# Patient Record
Sex: Female | Born: 1935 | Race: White | Hispanic: No | Smoking: Never smoker
Health system: Southern US, Community
[De-identification: ages and names within clinical notes are randomized; demographics above are authoritative.]

## PROBLEM LIST (undated history)

## (undated) DIAGNOSIS — F32A Depression, unspecified: Secondary | ICD-10-CM

## (undated) DIAGNOSIS — I2510 Atherosclerotic heart disease of native coronary artery without angina pectoris: Secondary | ICD-10-CM

## (undated) DIAGNOSIS — F329 Major depressive disorder, single episode, unspecified: Secondary | ICD-10-CM

## (undated) DIAGNOSIS — K219 Gastro-esophageal reflux disease without esophagitis: Secondary | ICD-10-CM

## (undated) DIAGNOSIS — N183 Chronic kidney disease, stage 3 unspecified: Secondary | ICD-10-CM

## (undated) DIAGNOSIS — E119 Type 2 diabetes mellitus without complications: Secondary | ICD-10-CM

## (undated) DIAGNOSIS — I9789 Other postprocedural complications and disorders of the circulatory system, not elsewhere classified: Secondary | ICD-10-CM

## (undated) DIAGNOSIS — M1990 Unspecified osteoarthritis, unspecified site: Secondary | ICD-10-CM

## (undated) DIAGNOSIS — I340 Nonrheumatic mitral (valve) insufficiency: Secondary | ICD-10-CM

## (undated) DIAGNOSIS — I4891 Unspecified atrial fibrillation: Secondary | ICD-10-CM

## (undated) DIAGNOSIS — C801 Malignant (primary) neoplasm, unspecified: Secondary | ICD-10-CM

## (undated) DIAGNOSIS — I10 Essential (primary) hypertension: Secondary | ICD-10-CM

## (undated) DIAGNOSIS — E11319 Type 2 diabetes mellitus with unspecified diabetic retinopathy without macular edema: Secondary | ICD-10-CM

## (undated) DIAGNOSIS — I739 Peripheral vascular disease, unspecified: Secondary | ICD-10-CM

## (undated) DIAGNOSIS — I219 Acute myocardial infarction, unspecified: Secondary | ICD-10-CM

## (undated) DIAGNOSIS — M47812 Spondylosis without myelopathy or radiculopathy, cervical region: Secondary | ICD-10-CM

## (undated) DIAGNOSIS — I351 Nonrheumatic aortic (valve) insufficiency: Secondary | ICD-10-CM

## (undated) DIAGNOSIS — K222 Esophageal obstruction: Secondary | ICD-10-CM

## (undated) DIAGNOSIS — Z8719 Personal history of other diseases of the digestive system: Secondary | ICD-10-CM

## (undated) DIAGNOSIS — E669 Obesity, unspecified: Secondary | ICD-10-CM

## (undated) DIAGNOSIS — R413 Other amnesia: Secondary | ICD-10-CM

## (undated) HISTORY — DX: Other amnesia: R41.3

## (undated) HISTORY — PX: ABDOMINAL HYSTERECTOMY: SHX81

## (undated) HISTORY — DX: Other postprocedural complications and disorders of the circulatory system, not elsewhere classified: I97.89

## (undated) HISTORY — DX: Obesity, unspecified: E66.9

## (undated) HISTORY — DX: Type 2 diabetes mellitus without complications: E11.9

## (undated) HISTORY — DX: Chronic kidney disease, stage 3 (moderate): N18.3

## (undated) HISTORY — PX: TOTAL KNEE ARTHROPLASTY: SHX125

## (undated) HISTORY — PX: TUBAL LIGATION: SHX77

## (undated) HISTORY — PX: CHOLECYSTECTOMY: SHX55

## (undated) HISTORY — DX: Esophageal obstruction: K22.2

## (undated) HISTORY — DX: Spondylosis without myelopathy or radiculopathy, cervical region: M47.812

## (undated) HISTORY — DX: Unspecified atrial fibrillation: I48.91

## (undated) HISTORY — PX: OTHER SURGICAL HISTORY: SHX169

## (undated) HISTORY — DX: Atherosclerotic heart disease of native coronary artery without angina pectoris: I25.10

## (undated) HISTORY — DX: Peripheral vascular disease, unspecified: I73.9

## (undated) HISTORY — PX: DILATION AND CURETTAGE OF UTERUS: SHX78

## (undated) HISTORY — DX: Acute myocardial infarction, unspecified: I21.9

## (undated) HISTORY — DX: Gastro-esophageal reflux disease without esophagitis: K21.9

## (undated) HISTORY — DX: Nonrheumatic mitral (valve) insufficiency: I34.0

## (undated) HISTORY — DX: Chronic kidney disease, stage 3 unspecified: N18.30

## (undated) HISTORY — DX: Unspecified osteoarthritis, unspecified site: M19.90

## (undated) HISTORY — DX: Nonrheumatic aortic (valve) insufficiency: I35.1

## (undated) HISTORY — DX: Essential (primary) hypertension: I10

---

## 1998-04-19 ENCOUNTER — Ambulatory Visit (HOSPITAL_COMMUNITY): Admission: RE | Admit: 1998-04-19 | Discharge: 1998-04-19 | Payer: Self-pay | Admitting: Internal Medicine

## 1998-04-19 ENCOUNTER — Encounter: Payer: Self-pay | Admitting: Internal Medicine

## 1998-09-01 ENCOUNTER — Inpatient Hospital Stay (HOSPITAL_COMMUNITY): Admission: EM | Admit: 1998-09-01 | Discharge: 1998-09-02 | Payer: Self-pay | Admitting: Emergency Medicine

## 1998-09-01 ENCOUNTER — Encounter: Payer: Self-pay | Admitting: Emergency Medicine

## 1999-10-29 ENCOUNTER — Encounter: Payer: Self-pay | Admitting: Emergency Medicine

## 1999-10-29 ENCOUNTER — Emergency Department (HOSPITAL_COMMUNITY): Admission: EM | Admit: 1999-10-29 | Discharge: 1999-10-29 | Payer: Self-pay | Admitting: Emergency Medicine

## 1999-12-09 ENCOUNTER — Other Ambulatory Visit: Admission: RE | Admit: 1999-12-09 | Discharge: 1999-12-09 | Payer: Self-pay | Admitting: Internal Medicine

## 2000-07-17 ENCOUNTER — Ambulatory Visit (HOSPITAL_COMMUNITY): Admission: RE | Admit: 2000-07-17 | Discharge: 2000-07-17 | Payer: Self-pay | Admitting: Internal Medicine

## 2000-07-17 ENCOUNTER — Encounter: Payer: Self-pay | Admitting: Internal Medicine

## 2000-08-09 ENCOUNTER — Encounter: Payer: Self-pay | Admitting: Gastroenterology

## 2000-08-09 ENCOUNTER — Ambulatory Visit (HOSPITAL_COMMUNITY): Admission: RE | Admit: 2000-08-09 | Discharge: 2000-08-09 | Payer: Self-pay | Admitting: Gastroenterology

## 2000-09-28 ENCOUNTER — Ambulatory Visit (HOSPITAL_COMMUNITY): Admission: RE | Admit: 2000-09-28 | Discharge: 2000-09-28 | Payer: Self-pay | Admitting: Gastroenterology

## 2000-09-28 ENCOUNTER — Encounter: Payer: Self-pay | Admitting: Gastroenterology

## 2000-10-10 ENCOUNTER — Encounter: Payer: Self-pay | Admitting: Gastroenterology

## 2000-10-10 ENCOUNTER — Ambulatory Visit (HOSPITAL_COMMUNITY): Admission: RE | Admit: 2000-10-10 | Discharge: 2000-10-10 | Payer: Self-pay | Admitting: Gastroenterology

## 2000-10-23 ENCOUNTER — Encounter: Admission: RE | Admit: 2000-10-23 | Discharge: 2000-10-23 | Payer: Self-pay | Admitting: Endocrinology

## 2000-10-23 ENCOUNTER — Encounter: Payer: Self-pay | Admitting: Endocrinology

## 2002-10-14 ENCOUNTER — Other Ambulatory Visit: Admission: RE | Admit: 2002-10-14 | Discharge: 2002-10-14 | Payer: Self-pay | Admitting: Obstetrics & Gynecology

## 2002-12-26 ENCOUNTER — Encounter (INDEPENDENT_AMBULATORY_CARE_PROVIDER_SITE_OTHER): Payer: Self-pay

## 2002-12-26 ENCOUNTER — Ambulatory Visit (HOSPITAL_COMMUNITY): Admission: RE | Admit: 2002-12-26 | Discharge: 2002-12-26 | Payer: Self-pay | Admitting: Obstetrics and Gynecology

## 2003-05-22 ENCOUNTER — Encounter (INDEPENDENT_AMBULATORY_CARE_PROVIDER_SITE_OTHER): Payer: Self-pay

## 2003-05-22 ENCOUNTER — Inpatient Hospital Stay (HOSPITAL_COMMUNITY): Admission: RE | Admit: 2003-05-22 | Discharge: 2003-05-24 | Payer: Self-pay | Admitting: Obstetrics and Gynecology

## 2004-04-25 ENCOUNTER — Ambulatory Visit: Payer: Self-pay | Admitting: Internal Medicine

## 2004-05-24 ENCOUNTER — Encounter: Admission: RE | Admit: 2004-05-24 | Discharge: 2004-05-24 | Payer: Self-pay | Admitting: Internal Medicine

## 2004-05-31 ENCOUNTER — Ambulatory Visit: Payer: Self-pay | Admitting: Internal Medicine

## 2004-06-07 ENCOUNTER — Ambulatory Visit: Payer: Self-pay | Admitting: Gastroenterology

## 2004-06-21 ENCOUNTER — Ambulatory Visit: Payer: Self-pay | Admitting: Gastroenterology

## 2004-07-05 ENCOUNTER — Ambulatory Visit: Payer: Self-pay | Admitting: Internal Medicine

## 2004-08-18 ENCOUNTER — Ambulatory Visit: Payer: Self-pay | Admitting: Internal Medicine

## 2005-03-02 ENCOUNTER — Ambulatory Visit: Payer: Self-pay | Admitting: Internal Medicine

## 2005-03-08 ENCOUNTER — Ambulatory Visit: Payer: Self-pay | Admitting: Cardiology

## 2005-03-08 ENCOUNTER — Ambulatory Visit: Payer: Self-pay | Admitting: Internal Medicine

## 2005-08-14 ENCOUNTER — Ambulatory Visit: Payer: Self-pay | Admitting: Internal Medicine

## 2005-08-17 ENCOUNTER — Ambulatory Visit: Payer: Self-pay | Admitting: Internal Medicine

## 2005-08-29 ENCOUNTER — Ambulatory Visit: Payer: Self-pay | Admitting: Internal Medicine

## 2006-05-07 ENCOUNTER — Ambulatory Visit: Payer: Self-pay | Admitting: Internal Medicine

## 2006-05-08 ENCOUNTER — Ambulatory Visit: Payer: Self-pay | Admitting: Gastroenterology

## 2006-05-09 ENCOUNTER — Encounter: Payer: Self-pay | Admitting: Gastroenterology

## 2006-05-09 ENCOUNTER — Ambulatory Visit (HOSPITAL_COMMUNITY): Admission: RE | Admit: 2006-05-09 | Discharge: 2006-05-09 | Payer: Self-pay | Admitting: Gastroenterology

## 2006-05-18 ENCOUNTER — Ambulatory Visit: Payer: Self-pay | Admitting: Gastroenterology

## 2006-07-03 ENCOUNTER — Ambulatory Visit: Payer: Self-pay | Admitting: Gastroenterology

## 2006-07-05 ENCOUNTER — Ambulatory Visit (HOSPITAL_COMMUNITY): Admission: RE | Admit: 2006-07-05 | Discharge: 2006-07-05 | Payer: Self-pay | Admitting: Gastroenterology

## 2006-07-11 ENCOUNTER — Ambulatory Visit: Payer: Self-pay | Admitting: Gastroenterology

## 2007-04-30 ENCOUNTER — Telehealth: Payer: Self-pay | Admitting: Internal Medicine

## 2007-05-22 ENCOUNTER — Emergency Department (HOSPITAL_COMMUNITY): Admission: EM | Admit: 2007-05-22 | Discharge: 2007-05-23 | Payer: Self-pay | Admitting: Emergency Medicine

## 2007-06-07 ENCOUNTER — Encounter: Payer: Self-pay | Admitting: *Deleted

## 2007-06-07 DIAGNOSIS — E118 Type 2 diabetes mellitus with unspecified complications: Secondary | ICD-10-CM

## 2007-06-07 DIAGNOSIS — M479 Spondylosis, unspecified: Secondary | ICD-10-CM | POA: Insufficient documentation

## 2007-06-07 DIAGNOSIS — M1990 Unspecified osteoarthritis, unspecified site: Secondary | ICD-10-CM | POA: Insufficient documentation

## 2007-06-07 DIAGNOSIS — Z9889 Other specified postprocedural states: Secondary | ICD-10-CM

## 2007-06-07 DIAGNOSIS — Z9089 Acquired absence of other organs: Secondary | ICD-10-CM

## 2007-06-07 DIAGNOSIS — Z9079 Acquired absence of other genital organ(s): Secondary | ICD-10-CM | POA: Insufficient documentation

## 2007-06-07 DIAGNOSIS — I10 Essential (primary) hypertension: Secondary | ICD-10-CM

## 2007-06-10 ENCOUNTER — Ambulatory Visit: Payer: Self-pay | Admitting: Internal Medicine

## 2007-06-10 LAB — CONVERTED CEMR LAB
ALT: 30 units/L (ref 0–35)
AST: 19 units/L (ref 0–37)
Albumin: 4.1 g/dL (ref 3.5–5.2)
Alkaline Phosphatase: 197 units/L — ABNORMAL HIGH (ref 39–117)
Basophils Relative: 0.2 % (ref 0.0–1.0)
Bilirubin, Direct: 0.1 mg/dL (ref 0.0–0.3)
Cholesterol: 174 mg/dL (ref 0–200)
Eosinophils Relative: 1.1 % (ref 0.0–5.0)
GFR calc Af Amer: 105 mL/min
GFR calc non Af Amer: 87 mL/min
Glucose, Bld: 110 mg/dL — ABNORMAL HIGH (ref 70–99)
HCT: 42.6 % (ref 36.0–46.0)
HDL: 80.3 mg/dL (ref >39.0)
Hemoglobin: 14.9 g/dL (ref 12.0–15.0)
LDL Cholesterol: 60 mg/dL (ref 0–99)
Lymphocytes Relative: 27.8 % (ref 12.0–46.0)
Monocytes Absolute: 0.7 10*3/uL (ref 0.2–0.7)
Potassium: 4.1 meq/L (ref 3.5–5.1)
Total CHOL/HDL Ratio: 2.2
Total Protein: 7.3 g/dL (ref 6.0–8.3)
Triglycerides: 170 mg/dL — ABNORMAL HIGH (ref 0–149)
VLDL: 34 mg/dL (ref 0–40)
WBC: 14.4 10*3/uL — ABNORMAL HIGH (ref 4.5–10.5)

## 2007-07-01 ENCOUNTER — Ambulatory Visit: Payer: Self-pay | Admitting: Internal Medicine

## 2007-07-01 DIAGNOSIS — C44300 Unspecified malignant neoplasm of skin of unspecified part of face: Secondary | ICD-10-CM | POA: Insufficient documentation

## 2007-07-01 DIAGNOSIS — C44309 Unspecified malignant neoplasm of skin of other parts of face: Secondary | ICD-10-CM | POA: Insufficient documentation

## 2007-07-02 ENCOUNTER — Encounter (INDEPENDENT_AMBULATORY_CARE_PROVIDER_SITE_OTHER): Payer: Self-pay | Admitting: *Deleted

## 2007-07-15 ENCOUNTER — Telehealth: Payer: Self-pay | Admitting: Internal Medicine

## 2007-09-19 ENCOUNTER — Telehealth: Payer: Self-pay | Admitting: Internal Medicine

## 2007-10-10 ENCOUNTER — Encounter: Payer: Self-pay | Admitting: Internal Medicine

## 2007-10-28 ENCOUNTER — Ambulatory Visit: Payer: Self-pay | Admitting: Internal Medicine

## 2007-11-07 ENCOUNTER — Encounter: Payer: Self-pay | Admitting: Internal Medicine

## 2007-11-13 ENCOUNTER — Encounter: Payer: Self-pay | Admitting: Internal Medicine

## 2007-11-27 ENCOUNTER — Telehealth: Payer: Self-pay | Admitting: Internal Medicine

## 2008-03-02 ENCOUNTER — Encounter: Payer: Self-pay | Admitting: Internal Medicine

## 2008-03-09 ENCOUNTER — Telehealth: Payer: Self-pay | Admitting: Internal Medicine

## 2008-05-20 ENCOUNTER — Ambulatory Visit: Payer: Self-pay | Admitting: Internal Medicine

## 2008-05-20 LAB — CONVERTED CEMR LAB
BUN: 17 mg/dL (ref 6–23)
Basophils Absolute: 0.1 10*3/uL (ref 0.0–0.1)
CO2: 31 meq/L (ref 19–32)
Calcium: 9.9 mg/dL (ref 8.4–10.5)
Cholesterol: 189 mg/dL (ref 0–200)
Eosinophils Absolute: 0.2 10*3/uL (ref 0.0–0.7)
GFR calc Af Amer: 105 mL/min
GFR calc non Af Amer: 87 mL/min
Glucose, Bld: 139 mg/dL — ABNORMAL HIGH (ref 70–99)
Lymphocytes Relative: 38.3 % (ref 12.0–46.0)
Monocytes Absolute: 0.6 10*3/uL (ref 0.1–1.0)
Neutro Abs: 3.7 10*3/uL (ref 1.4–7.7)
RDW: 12.7 % (ref 11.5–14.6)
Triglycerides: 102 mg/dL (ref 0–149)

## 2008-05-21 ENCOUNTER — Encounter: Payer: Self-pay | Admitting: Internal Medicine

## 2008-05-26 ENCOUNTER — Encounter: Payer: Self-pay | Admitting: Internal Medicine

## 2008-05-26 LAB — HM MAMMOGRAPHY: HM Mammogram: NORMAL

## 2008-05-28 ENCOUNTER — Encounter: Payer: Self-pay | Admitting: Internal Medicine

## 2008-07-16 ENCOUNTER — Encounter: Payer: Self-pay | Admitting: Internal Medicine

## 2008-07-21 ENCOUNTER — Telehealth: Payer: Self-pay | Admitting: Internal Medicine

## 2008-09-01 ENCOUNTER — Telehealth: Payer: Self-pay | Admitting: Internal Medicine

## 2008-10-28 LAB — HM DIABETES EYE EXAM: HM Diabetic Eye Exam: NORMAL

## 2008-12-14 ENCOUNTER — Telehealth: Payer: Self-pay | Admitting: Internal Medicine

## 2008-12-24 ENCOUNTER — Encounter: Admission: RE | Admit: 2008-12-24 | Discharge: 2009-01-07 | Payer: Self-pay | Admitting: Orthopedic Surgery

## 2009-03-16 ENCOUNTER — Telehealth: Payer: Self-pay | Admitting: Internal Medicine

## 2009-03-24 ENCOUNTER — Telehealth: Payer: Self-pay | Admitting: Internal Medicine

## 2009-04-23 ENCOUNTER — Telehealth: Payer: Self-pay | Admitting: Internal Medicine

## 2009-05-06 ENCOUNTER — Ambulatory Visit: Payer: Self-pay | Admitting: Internal Medicine

## 2009-05-06 LAB — CONVERTED CEMR LAB
ALT: 21 units/L (ref 0–35)
AST: 25 units/L (ref 0–37)
Albumin: 4.2 g/dL (ref 3.5–5.2)
Alkaline Phosphatase: 65 units/L (ref 39–117)
BUN: 13 mg/dL (ref 6–23)
Calcium: 9.9 mg/dL (ref 8.4–10.5)
GFR calc non Af Amer: 74.2 mL/min (ref >60)
Glucose, Bld: 129 mg/dL — ABNORMAL HIGH (ref 70–99)
Potassium: 4.6 meq/L (ref 3.5–5.1)
Sodium: 142 meq/L (ref 135–145)
Total Bilirubin: 0.5 mg/dL (ref 0.3–1.2)
VLDL: 49 mg/dL — ABNORMAL HIGH (ref 0.0–40.0)

## 2009-05-10 ENCOUNTER — Ambulatory Visit: Payer: Self-pay | Admitting: Family Medicine

## 2009-05-10 ENCOUNTER — Encounter: Payer: Self-pay | Admitting: Internal Medicine

## 2009-06-03 ENCOUNTER — Encounter: Payer: Self-pay | Admitting: Internal Medicine

## 2009-08-02 ENCOUNTER — Ambulatory Visit: Payer: Self-pay | Admitting: Internal Medicine

## 2009-08-03 ENCOUNTER — Telehealth: Payer: Self-pay | Admitting: Internal Medicine

## 2009-10-31 ENCOUNTER — Ambulatory Visit: Payer: Self-pay | Admitting: Internal Medicine

## 2009-10-31 ENCOUNTER — Inpatient Hospital Stay (HOSPITAL_COMMUNITY): Admission: EM | Admit: 2009-10-31 | Discharge: 2009-11-01 | Payer: Self-pay | Admitting: Emergency Medicine

## 2009-11-05 ENCOUNTER — Ambulatory Visit: Payer: Self-pay | Admitting: Internal Medicine

## 2009-11-05 ENCOUNTER — Telehealth: Payer: Self-pay | Admitting: Internal Medicine

## 2009-11-05 DIAGNOSIS — D509 Iron deficiency anemia, unspecified: Secondary | ICD-10-CM

## 2009-11-05 LAB — CONVERTED CEMR LAB
BUN: 14 mg/dL (ref 6–23)
Basophils Relative: 0.5 % (ref 0.0–3.0)
CO2: 31 meq/L (ref 19–32)
Calcium: 9.9 mg/dL (ref 8.4–10.5)
Chloride: 99 meq/L (ref 96–112)
Creatinine, Ser: 0.8 mg/dL (ref 0.4–1.2)
Eosinophils Absolute: 0.3 10*3/uL (ref 0.0–0.7)
Glucose, Bld: 142 mg/dL — ABNORMAL HIGH (ref 70–99)
Glucose, Urine, Semiquant: NEGATIVE
HCT: 37.4 % (ref 36.0–46.0)
Ketones, urine, test strip: NEGATIVE
Lymphocytes Relative: 21.7 % (ref 12.0–46.0)
MCHC: 33.3 g/dL (ref 30.0–36.0)
Monocytes Relative: 3.8 % (ref 3.0–12.0)
Neutro Abs: 9 10*3/uL — ABNORMAL HIGH (ref 1.4–7.7)
Nitrite: NEGATIVE
Potassium: 4.9 meq/L (ref 3.5–5.1)
Protein, U semiquant: NEGATIVE
WBC Urine, dipstick: NEGATIVE
pH: 5

## 2009-11-07 ENCOUNTER — Encounter: Payer: Self-pay | Admitting: Internal Medicine

## 2009-11-11 ENCOUNTER — Telehealth: Payer: Self-pay | Admitting: Internal Medicine

## 2009-11-12 ENCOUNTER — Ambulatory Visit: Payer: Self-pay | Admitting: Internal Medicine

## 2009-12-01 ENCOUNTER — Telehealth: Payer: Self-pay | Admitting: Internal Medicine

## 2009-12-13 ENCOUNTER — Ambulatory Visit: Payer: Self-pay | Admitting: Gastroenterology

## 2009-12-14 ENCOUNTER — Ambulatory Visit: Payer: Self-pay | Admitting: Gastroenterology

## 2009-12-16 ENCOUNTER — Encounter: Payer: Self-pay | Admitting: Gastroenterology

## 2009-12-25 ENCOUNTER — Encounter: Payer: Self-pay | Admitting: Internal Medicine

## 2010-01-06 ENCOUNTER — Encounter (INDEPENDENT_AMBULATORY_CARE_PROVIDER_SITE_OTHER): Payer: Self-pay | Admitting: *Deleted

## 2010-01-10 ENCOUNTER — Ambulatory Visit: Payer: Self-pay | Admitting: Gastroenterology

## 2010-01-10 ENCOUNTER — Encounter (INDEPENDENT_AMBULATORY_CARE_PROVIDER_SITE_OTHER): Payer: Self-pay | Admitting: *Deleted

## 2010-01-24 ENCOUNTER — Ambulatory Visit: Payer: Self-pay | Admitting: Gastroenterology

## 2010-04-26 ENCOUNTER — Ambulatory Visit: Payer: Self-pay | Admitting: Internal Medicine

## 2010-05-02 ENCOUNTER — Ambulatory Visit: Payer: Self-pay | Admitting: Internal Medicine

## 2010-05-03 ENCOUNTER — Telehealth: Payer: Self-pay | Admitting: Internal Medicine

## 2010-05-16 ENCOUNTER — Telehealth: Payer: Self-pay | Admitting: Internal Medicine

## 2010-06-06 ENCOUNTER — Ambulatory Visit: Payer: Self-pay | Admitting: Internal Medicine

## 2010-06-06 ENCOUNTER — Encounter: Payer: Self-pay | Admitting: Internal Medicine

## 2010-06-06 LAB — CONVERTED CEMR LAB
Hemoglobin: 13 g/dL (ref 12.0–15.0)
Hgb A1c MFr Bld: 8 % — ABNORMAL HIGH (ref 4.6–6.5)
Iron: 74 ug/dL (ref 42–145)
Saturation Ratios: 21.3 % (ref 20.0–50.0)

## 2010-06-07 ENCOUNTER — Telehealth: Payer: Self-pay | Admitting: Internal Medicine

## 2010-06-17 ENCOUNTER — Telehealth (INDEPENDENT_AMBULATORY_CARE_PROVIDER_SITE_OTHER): Payer: Self-pay | Admitting: *Deleted

## 2010-06-27 ENCOUNTER — Inpatient Hospital Stay (HOSPITAL_COMMUNITY)
Admission: RE | Admit: 2010-06-27 | Discharge: 2010-06-30 | Payer: Self-pay | Source: Home / Self Care | Attending: Orthopedic Surgery | Admitting: Orthopedic Surgery

## 2010-07-04 LAB — BASIC METABOLIC PANEL
BUN: 7 mg/dL (ref 6–23)
BUN: 8 mg/dL (ref 6–23)
CO2: 26 mEq/L (ref 19–32)
CO2: 27 mEq/L (ref 19–32)
Calcium: 8.9 mg/dL (ref 8.4–10.5)
Calcium: 9.3 mg/dL (ref 8.4–10.5)
Chloride: 99 mEq/L (ref 96–112)
Chloride: 103 mEq/L (ref 96–112)
Creatinine, Ser: 0.72 mg/dL (ref 0.4–1.2)
Creatinine, Ser: 0.74 mg/dL (ref 0.4–1.2)
GFR calc Af Amer: >60 mL/min (ref >60)
GFR calc Af Amer: >60 mL/min (ref >60)
GFR calc non Af Amer: >60 mL/min (ref >60)
GFR calc non Af Amer: >60 mL/min (ref >60)
Glucose, Bld: 202 mg/dL — ABNORMAL HIGH (ref 70–99)
Glucose, Bld: 197 mg/dL — ABNORMAL HIGH (ref 70–99)
Potassium: 3.8 mEq/L (ref 3.5–5.1)
Potassium: 3.8 mEq/L (ref 3.5–5.1)
Sodium: 134 mEq/L — ABNORMAL LOW (ref 135–145)
Sodium: 137 mEq/L (ref 135–145)

## 2010-07-04 LAB — CBC
HCT: 31 % — ABNORMAL LOW (ref 36.0–46.0)
HCT: 29.3 % — ABNORMAL LOW (ref 36.0–46.0)
Hemoglobin: 10.2 g/dL — ABNORMAL LOW (ref 12.0–15.0)
Hemoglobin: 9.8 g/dL — ABNORMAL LOW (ref 12.0–15.0)
MCH: 29.8 pg (ref 26.0–34.0)
MCH: 30.3 pg (ref 26.0–34.0)
MCHC: 32.9 g/dL (ref 30.0–36.0)
MCHC: 33.4 g/dL (ref 30.0–36.0)
MCV: 90.6 fL (ref 78.0–100.0)
MCV: 90.7 fL (ref 78.0–100.0)
Platelets: 220 10*3/uL (ref 150–400)
Platelets: 199 10*3/uL (ref 150–400)
RBC: 3.42 MIL/uL — ABNORMAL LOW (ref 3.87–5.11)
RBC: 3.23 MIL/uL — ABNORMAL LOW (ref 3.87–5.11)
RDW: 12.5 % (ref 11.5–15.5)
RDW: 12.5 % (ref 11.5–15.5)
WBC: 11.4 10*3/uL — ABNORMAL HIGH (ref 4.0–10.5)
WBC: 11.2 10*3/uL — ABNORMAL HIGH (ref 4.0–10.5)

## 2010-07-04 LAB — GLUCOSE, CAPILLARY
Glucose-Capillary: 143 mg/dL — ABNORMAL HIGH (ref 70–99)
Glucose-Capillary: 127 mg/dL — ABNORMAL HIGH (ref 70–99)
Glucose-Capillary: 231 mg/dL — ABNORMAL HIGH (ref 70–99)
Glucose-Capillary: 213 mg/dL — ABNORMAL HIGH (ref 70–99)
Glucose-Capillary: 183 mg/dL — ABNORMAL HIGH (ref 70–99)
Glucose-Capillary: 195 mg/dL — ABNORMAL HIGH (ref 70–99)
Glucose-Capillary: 264 mg/dL — ABNORMAL HIGH (ref 70–99)
Glucose-Capillary: 172 mg/dL — ABNORMAL HIGH (ref 70–99)
Glucose-Capillary: 181 mg/dL — ABNORMAL HIGH (ref 70–99)
Glucose-Capillary: 203 mg/dL — ABNORMAL HIGH (ref 70–99)
Glucose-Capillary: 158 mg/dL — ABNORMAL HIGH (ref 70–99)
Glucose-Capillary: 192 mg/dL — ABNORMAL HIGH (ref 70–99)
Glucose-Capillary: 178 mg/dL — ABNORMAL HIGH (ref 70–99)

## 2010-07-04 LAB — URINALYSIS, ROUTINE W REFLEX MICROSCOPIC
Bilirubin Urine: NEGATIVE
Hgb urine dipstick: NEGATIVE
Ketones, ur: 15 mg/dL — AB
Nitrite: NEGATIVE
Protein, ur: NEGATIVE mg/dL
Specific Gravity, Urine: 1.016 (ref 1.005–1.030)
Urine Glucose, Fasting: 500 mg/dL — AB
Urobilinogen, UA: 0.2 mg/dL (ref 0.0–1.0)
pH: 5.5 (ref 5.0–8.0)

## 2010-07-04 LAB — TYPE AND SCREEN
ABO/RH(D): A POS
Antibody Screen: NEGATIVE

## 2010-07-04 LAB — ABO/RH: ABO/RH(D): A POS

## 2010-07-21 NOTE — Procedures (Signed)
Summary: Upper Endoscopy  Patient: Andrea Wilkinson Note: All result statuses are Final unless otherwise noted.  Tests: (1) Upper Endoscopy (EGD)   EGD Upper Endoscopy       DONE (C)     [facility]     [address]     [place], [place]  [address]           ENDOSCOPY PROCEDURE REPORT           PATIENT:  Andrea, Wilkinson  MR#:  [id number]     BIRTHDATE:  [birth year], 78 yrs. old  GENDER:  female           ENDOSCOPIST:  Barbette Hair. Arlyce Dice, MD     Referred by:           PROCEDURE DATE:  12/14/2009     PROCEDURE:  EGD with biopsy     ASA CLASS:  Class II     INDICATIONS:  iron deficiency anemia           MEDICATIONS:   Fentanyl 50 mcg IV, Versed 4 mg IV, glycopyrrolate     (Robinal) 0.2 mg IV, 0.6cc simethancone 0.6 cc PO     TOPICAL ANESTHETIC:  Exactacain Spray           DESCRIPTION OF PROCEDURE:   After the risks benefits and     alternatives of the procedure were thoroughly explained, informed     consent was obtained.  The LB GIF-H180 [id number] endoscope was     introduced through the mouth and advanced to the third portion of     the duodenum, without limitations.  The instrument was slowly     withdrawn as the mucosa was fully examined.     <<PROCEDUREIMAGES>>           Mild gastritis was found in the total stomach. Nonerosive     gastritis throughout the stomach with areas of mild to moderate     erythema. Bxs taken (see image3 and image4).  Otherwise the     examination was normal (see image1, image2, image5, and image6).     Retroflexed views revealed no abnormalities.    The scope was then     withdrawn from the patient and the procedure completed.           COMPLICATIONS:  None           ENDOSCOPIC IMPRESSION:     1) Mild gastritis in the total stomach     2) Otherwise normal examination           Findings are probably NSAID-related but do not explain Fe     deficiency anemia     RECOMMENDATIONS:     1) My office will schedule a colonoscopy           REPEAT  EXAM:  No           ______________________________     Barbette Hair. Arlyce Dice, MD           CC:  Jacques Navy, MD           n.     REVISED:  12/30/2009 09:31 AM     eSIGNED:   Barbette Hair. Kaplan at 12/30/2009 09:31 AM           Stull, Prudy Feeler, [id number]  Note: An exclamation mark (!) indicates a result that was not dispersed into the flowsheet. Document Creation Date: 12/30/2009 9:32 AM _______________________________________________________________________  (1) Order result status: Final Collection  or observation date-time: 12/14/2009 14:35 Requested date-time:  Receipt date-time:  Reported date-time:  Referring Physician:   Ordering Physician: Melvia Heaps [phone]) Specimen Source:  Source: Launa Grill Order Number: [id number] Lab site:

## 2010-07-21 NOTE — Procedures (Signed)
Summary: Colonoscopy  Patient: Andrea Wilkinson Note: All result statuses are Final unless otherwise noted.  Tests: (1) Colonoscopy (COL)   COL Colonoscopy           DONE     [facility]     [address]     [place], [place]  [address]           COLONOSCOPY PROCEDURE REPORT           PATIENT:  Andrea Wilkinson, Andrea Wilkinson  MR#:  [id number]     BIRTHDATE:  Aug 21, 1931, 78 yrs. old  GENDER:  female           ENDOSCOPIST:  Barbette Hair. Arlyce Dice, MD     Referred by:           PROCEDURE DATE:  01/24/2010     PROCEDURE:  Diagnostic Colonoscopy     ASA CLASS:  Class II     INDICATIONS:  1) Iron deficiency anemia           MEDICATIONS:   Fentanyl 37.5 mcg IV, Versed 4 mg IV           DESCRIPTION OF PROCEDURE:   After the risks benefits and     alternatives of the procedure were thoroughly explained, informed     consent was obtained.  Digital rectal exam was performed and     revealed no abnormalities.   The LB [id number]-H180AL [id number] endoscope     was introduced through the anus and advanced to the cecum, which     was identified by both the appendix and ileocecal valve, without     limitations.  The quality of the prep was excellent, using     MoviPrep.  The instrument was then slowly withdrawn as the colon     was fully examined.     <<PROCEDUREIMAGES>>           FINDINGS:  Severe diverticulosis was found in the sigmoid colon.     Scattered diverticula were found ascending colon to sigmoid colon     (see image5, image6, image8, and image15).  This was otherwise a     normal examination of the colon (see image1, image2, image13, and     image17).   Retroflexed views in the rectum revealed no     abnormalities.    The time to cecum =  7.0  minutes. The scope was     then withdrawn (time =  6.0  min) from the patient and the     procedure completed.           COMPLICATIONS:  None           ENDOSCOPIC IMPRESSION:     1) Severe diverticulosis in the sigmoid colon     2) Diverticula, scattered  ascending colon to sigmoid colon     3) Otherwise normal examination     RECOMMENDATIONS:No further GI w/u (stool heme negative; pt taking     NSAIDS)           REPEAT EXAM:  No           ______________________________     Barbette Hair. Arlyce Dice, MD           CC: Jacques Navy, MD           n.     Rosalie DoctorBarbette Hair. Kaplan at 01/24/2010 11:30 AM           Feld, Prudy Feeler, [id number]  Note: An  exclamation mark (!) indicates a result that was not dispersed into the flowsheet. Document Creation Date: 01/24/2010 11:32 AM _______________________________________________________________________  (1) Order result status: Final Collection or observation date-time: 01/24/2010 11:26 Requested date-time:  Receipt date-time:  Reported date-time:  Referring Physician:   Ordering Physician: Melvia Heaps [phone]) Specimen Source:  Source: Launa Grill Order Number: [id number] Lab site:

## 2010-07-21 NOTE — Assessment & Plan Note (Signed)
Summary: SURGICAL CLEARANCE/NWS   Vital Signs:  Patient profile:   77 year old female Height:      62 inches Weight:      168 pounds BMI:     30.84 O2 Sat:      98 % on Room air Temp:     97.9 degrees F oral Pulse rate:   82 / minute BP sitting:   142 / 80  (left arm) Cuff size:   regular  Vitals Entered By: Bill Salinas CMA (June 06, 2010 10:45 AM)  O2 Flow:  Room air CC: office visit for surgical clearance for left knee replacement sch for Jun 27, 2010 with Dr Charlann Boxer ab   Primary Care Provider:  Illene Regulus, MD  CC:  office visit for surgical clearance for left knee replacement sch for Jan 09 and 2012 with Dr Charlann Boxer ab.  History of Present Illness: Patient presents for a pre-operative evaluation. She is a diabetic on oral medication. Her last A1C May '11 was 7.8% on metformin 500mg  two times a day and glucotrol 5 mg once a day. she has a h/o iron deficiency anemia. Her last Hgb  12.2 g May '11.   she has been doing OK except for knee pain: no shortness of breath, no chest pain. she does report that her bllod sugar has been running high at home. with fasting CBGs in the 145p-160.  Current Medications (verified): 1)  Enalapril Maleate 20 Mg Tabs (Enalapril Maleate) .... Take 1 Tablet By Mouth Once A Day 2)  Glucotrol 5 Mg  Tabs (Glipizide) .... Take One Tablet Once Daily 3)  Tramadol Hcl 50 Mg Tabs (Tramadol Hcl) .... Take 1-2 Tablet By Mouth Four Times A Day 4)  Mobic 15 Mg  Tabs (Meloxicam) .Marland Kitchen.. 1 Once Daily 5)  Lasix 20 Mg  Tabs (Furosemide) .... Take One Tablet Once Daily 6)  Caltrate 600+d Plus 600-400 Mg-Unit  Tabs (Calcium Carbonate-Vit D-Min) .... Take One Tablet Twice Daily 7)  Indomethacin 25 Mg  Caps (Indomethacin) .... Take 1 Tab Q6 As Needed 8)  Belladonna Alk-Phenobarbital 16.2 Mg  Tabs (Belladonna Alk-Phenobarbital) .Marland Kitchen.. 1 By Mouth As Needed Q12 9)  Metformin Hcl 500 Mg  Tabs (Metformin Hcl) .Marland Kitchen.. 1 By Mouth Two Times A Day 10)  Benzonatate 100 Mg  Caps  (Benzonatate) .Marland Kitchen.. 1 Three Times A Day For Cough As Needed. 11)  Omeprazole 20 Mg  Tbec (Omeprazole) .... Take 1 Tablet By Mouth Every Morning As Needed 12)  Cyclobenzaprine Hcl 5 Mg Tabs (Cyclobenzaprine Hcl) .Marland Kitchen.. 1 By Mouth Three Times A Day For Back Pain.  Allergies (verified): 1)  ! Codeine 2)  ! Aspirin  Past History:  Past Medical History: Last updated: 12/03/2009 GASTROESOPHAGEAL REFLUX DISEASE WITH STRICTURE (ICD-530.81) HYPERTENSION (ICD-401.9) Hx of ARTHRITIS, CERVICAL SPINE (ICD-721.90) DEGENERATIVE JOINT DISEASE (ICD-715.90) DIABETES MELLITUS, TYPE II (ICD-250.00) Esophageal Stricture  Past Surgical History: Last updated: 06/07/2007 HYSTERECTOMY, HX OF (ICD-V45.77) DILATION AND CURETTAGE, HX OF (ICD-V45.89) TUBAL LIGATION, HX OF (ICD-V26.51) * LEFT BREAST LUMPECTOMY * BENIGN TUMOR REMOVED FROM LEFT HIP CHOLECYSTECTOMY, HX OF (ICD-V45.79)  Family History: Last updated: January 05, 2010 father- died 72; suicide mohter- died 63- childbirth Neg-breast,colon cancer; CAD, CVA, ovarian or other gyn cancer Family History of Diabetes: father  Social History: Last updated: 01/05/2010 married 1955-1990 5 sons- '56, '57, '59, '62, '70 grandhcildren 10, 6 great-grandchildren Lives in own home, son lives with her. I-ADLs Discussed end of life care: would not want resuscitation or being maintained with mechanical ventilation.  Daily Caffeine Use 1 cup of coffe per day  Review of Systems  The patient denies anorexia, fever, decreased hearing, chest pain, dyspnea on exertion, prolonged cough, abdominal pain, severe indigestion/heartburn, incontinence, suspicious skin lesions, difficulty walking, depression, abnormal bleeding, and breast masses.    Physical Exam  General:  alert, well-developed, well-nourished, and normal appearance.   Head:  normocephalic and atraumatic.   Eyes:  vision grossly intact, pupils equal, and pupils round.   Ears:  R ear normal and L ear normal.     Neck:  supple, no thyromegaly, and no carotid bruits.   Lungs:  normal respiratory effort, normal breath sounds, no crackles, and no wheezes.   Heart:  normal rate, regular rhythm, and no murmur.   Abdomen:  soft, non-tender, and no guarding.   Neurologic:  alert & oriented X3, cranial nerves II-XII intact, strength normal in all extremities, gait normal, and DTRs symmetrical and normal.   Skin:  turgor normal, color normal, and no rashes.   Cervical Nodes:  no anterior cervical adenopathy and no posterior cervical adenopathy.   Psych:  Oriented X3, memory intact for recent and remote, normally interactive, and good eye contact.     Impression & Recommendations:  Problem # 1:  ANEMIA, IRON DEFICIENCY (ICD-280.9) Last Hgb 12.2.  Plan - lab to check Hgb and iron level. If low iron will arrange for IV iron prior to surgery.  Orders: TLB-IBC Pnl (Iron/FE;Transferrin) (83550-IBC) TLB-Hemoglobin (Hgb) (85018-HGB)  Addendum - Hgb 13, Iron level normal  Problem # 2:  HYPERTENSION (ICD-401.9)  Her updated medication list for this problem includes:    Enalapril Maleate 20 Mg Tabs (Enalapril maleate) .Marland Kitchen... Take 1 tablet by mouth once a day    Lasix 20 Mg Tabs (Furosemide) .Marland Kitchen... Take one tablet once daily  BP today: 142/80 Prior BP: 142/68 (12/13/2009)  Labs Reviewed: K+: 4.9 (11/05/2009) Creat: : 0.8 (11/05/2009)    Adequate control - OK for surgery. Will need monitoring while in-patient  Problem # 3:  DIABETES MELLITUS, TYPE II (ICD-250.00)  Her updated medication list for this problem includes:    Enalapril Maleate 20 Mg Tabs (Enalapril maleate) .Marland Kitchen... Take 1 tablet by mouth once a day    Glucotrol 5 Mg Tabs (Glipizide) .Marland Kitchen... Take one tablet once daily    Metformin Hcl 500 Mg Tabs (Metformin hcl) .Marland Kitchen... 1 by mouth two times a day  Orders: TLB-A1C / Hgb A1C (Glycohemoglobin) (83036-A1C)  Labs Reviewed: Creat: 0.8 (11/05/2009)     Last Eye Exam: normal (10/28/2008) Reviewed  HgBA1c results: 7.8 (11/05/2009)  6.9 (05/06/2009)  Plan - A1C today with adjustment in chronic meds as indicated.            Will need peri-operative sliding scale coverage.  addendum A1C 8% Plan - increase metformin to 1g two times a day.  Problem # 4:  PREOPERATIVE EXAMINATION (ICD-V72.84)  medically stable for surgery. Will attend to h/o iron deficiency anemia as noted above. Will be happy to follow this patient while in hospital to assist in managment of her medical conditions.   Orders: EKG w/ Interpretation (93000)  Complete Medication List: 1)  Enalapril Maleate 20 Mg Tabs (Enalapril maleate) .... Take 1 tablet by mouth once a day 2)  Glucotrol 5 Mg Tabs (Glipizide) .... Take one tablet once daily 3)  Tramadol Hcl 50 Mg Tabs (Tramadol hcl) .... Take 1-2 tablet by mouth four times a day 4)  Mobic 15 Mg Tabs (Meloxicam) .Marland Kitchen.. 1 once daily 5)  Lasix 20 Mg Tabs (Furosemide) .... Take one tablet once daily 6)  Caltrate 600+d Plus 600-400 Mg-unit Tabs (Calcium carbonate-vit d-min) .... Take one tablet twice daily 7)  Indomethacin 25 Mg Caps (Indomethacin) .... Take 1 tab q6 as needed 8)  Belladonna Alk-phenobarbital 16.2 Mg Tabs (Belladonna alk-phenobarbital) .Marland Kitchen.. 1 by mouth as needed q12 9)  Metformin Hcl 500 Mg Tabs (Metformin hcl) .Marland Kitchen.. 1 by mouth two times a day 10)  Benzonatate 100 Mg Caps (Benzonatate) .Marland Kitchen.. 1 three times a day for cough as needed. 11)  Omeprazole 20 Mg Tbec (Omeprazole) .... Take 1 tablet by mouth every morning as needed 12)  Cyclobenzaprine Hcl 5 Mg Tabs (Cyclobenzaprine hcl) .Marland Kitchen.. 1 by mouth three times a day for back pain.   Orders Added: 1)  TLB-IBC Pnl (Iron/FE;Transferrin) [83550-IBC] 2)  TLB-Hemoglobin (Hgb) [85018-HGB] 3)  TLB-A1C / Hgb A1C (Glycohemoglobin) [83036-A1C] 4)  Consultation Level III [40981] 5)  EKG w/ Interpretation [93000]

## 2010-07-21 NOTE — Progress Notes (Signed)
Summary: NEEDS REFERRAL ASAP  Phone Note Call from Patient Call back at Home Phone [phone]   Summary of Call: Patient is requesting referral to Dr Charlann Boxer, unsure of reason.  Initial call taken by: Lamar Sprinkles, CMA,  December 01, 2009 3:28 PM  Follow-up for Phone Call        Pt has seen Dr Charlann Boxer in the past for knees and neck. C/o back pain x 2 mths and wants to see Dr Charlann Boxer. Pt has apt for 1:15 tomorrow but needs referral from PCP Follow-up by: Lamar Sprinkles, CMA,  December 01, 2009 5:44 PM  Additional Follow-up for Phone Call Additional follow up Details #1::        k. referral requested Additional Follow-up by: Jacques Navy MD,  December 02, 2009 5:49 AM

## 2010-07-21 NOTE — Progress Notes (Signed)
Summary: PAPERWORK  Phone Note Call from Patient Call back at Home Phone [phone]   Summary of Call: Patient is requesting to know if MD recieved and completed papers she mailed to MD. Pt originally dropped off papers and said they were mailed back w/o being completed so I had advised her to mail them back b/c transportation was an issue.  Initial call taken by: Lamar Sprinkles, CMA,  May 16, 2010 10:08 AM  Follow-up for Phone Call        opened in this mornings mail. Wil lwork on it.  Follow-up by: Jacques Navy MD,  May 16, 2010 10:30 AM  Additional Follow-up for Phone Call Additional follow up Details #1::        completed paperwork and turned over to Ami for some additional information.  Additional Follow-up by: Jacques Navy MD,  May 20, 2010 5:10 PM    Additional Follow-up for Phone Call Additional follow up Details #2::    latoya spoke with pt and she is unaware of paperwork. She states she has already gotten the paper work that she needed. Follow-up by: Ami Bullins CMA,  May 30, 2010 3:07 PM

## 2010-07-21 NOTE — Assessment & Plan Note (Signed)
Summary: iron def. anemia...em   History of Present Illness Visit Type: Initial Consult Primary GI MD: Melvia Heaps MD [facility] Primary Provider: Illene Regulus, MD Requesting Provider: Illene Regulus, MD Chief Complaint: Patient complains of being tired alot but no other complaints she is referred for iron def anemia.  History of Present Illness:   Andrea Wilkinson is a 77 year old white female referred at the request of Dr. Debby Bud for evaluation of an iron deficiency anemia.  She has a history of  an esophageal stricture for which she was dilated in 2007.  Colonoscopy in 2006 demonstrated diverticulosis only.  She takes iron supplementation.  She has been on NSAIDs for several years, most recently Mobic.  He denies abdominal pain, melena or hematochezia or dysphagia..  Hemoglobin in May, 2011 was 12.5.  It was 13.5 in December, 2009.     GI Review of Systems      Denies abdominal pain, acid reflux, belching, bloating, chest pain, dysphagia with liquids, dysphagia with solids, heartburn, loss of appetite, nausea, vomiting, vomiting blood, weight loss, and  weight gain.        Denies anal fissure, black tarry stools, change in bowel habit, constipation, diarrhea, diverticulosis, fecal incontinence, heme positive stool, hemorrhoids, irritable bowel syndrome, jaundice, light color stool, liver problems, rectal bleeding, and  rectal pain.    Current Medications (verified): 1)  Enalapril Maleate 20 Mg Tabs (Enalapril Maleate) .... Take 1 Tablet By Mouth Once A Day 2)  Glucotrol 5 Mg  Tabs (Glipizide) .... Take One Tablet Once Daily 3)  Tramadol Hcl 50 Mg Tabs (Tramadol Hcl) .... Take 1-2 Tablet By Mouth Four Times A Day 4)  Mobic 15 Mg  Tabs (Meloxicam) .Marland Kitchen.. 1 Once Daily 5)  Lasix 20 Mg  Tabs (Furosemide) .... Take One Tablet Once Daily 6)  Caltrate 600+d Plus 600-400 Mg-Unit  Tabs (Calcium Carbonate-Vit D-Min) .... Take One Tablet Twice Daily 7)  Indomethacin 25 Mg  Caps (Indomethacin) .... Take 1  Tab Q6 As Needed 8)  Belladonna Alk-Phenobarbital 16.2 Mg  Tabs (Belladonna Alk-Phenobarbital) .Marland Kitchen.. 1 By Mouth As Needed Q12 9)  Metformin Hcl 500 Mg  Tabs (Metformin Hcl) .Marland Kitchen.. 1 By Mouth Two Times A Day 10)  Benzonatate 100 Mg  Caps (Benzonatate) .Marland Kitchen.. 1 Three Times A Day For Cough As Needed. 11)  Omeprazole 20 Mg  Tbec (Omeprazole) .... Take 1 Tablet By Mouth Every Morning As Needed 12)  Diclofenac Sodium 75 Mg  Tbec (Diclofenac Sodium) .... Take 1 Tablet By Mouth Once A Day 13)  Cyclobenzaprine Hcl 5 Mg Tabs (Cyclobenzaprine Hcl) .Marland Kitchen.. 1 By Mouth Three Times A Day For Back Pain.  Allergies (verified): 1)  ! Codeine 2)  ! Aspirin  Past History:  Past Medical History: Reviewed history from 12/03/2009 and no changes required. GASTROESOPHAGEAL REFLUX DISEASE WITH STRICTURE (ICD-530.81) HYPERTENSION (ICD-401.9) Hx of ARTHRITIS, CERVICAL SPINE (ICD-721.90) DEGENERATIVE JOINT DISEASE (ICD-715.90) DIABETES MELLITUS, TYPE II (ICD-250.00) Esophageal Stricture  Past Surgical History: Reviewed history from 06/07/2007 and no changes required. HYSTERECTOMY, HX OF (ICD-V45.77) DILATION AND CURETTAGE, HX OF (ICD-V45.89) TUBAL LIGATION, HX OF (ICD-V26.51) * LEFT BREAST LUMPECTOMY * BENIGN TUMOR REMOVED FROM LEFT HIP CHOLECYSTECTOMY, HX OF (ICD-V45.79)  Family History: father- died 65; suicide mohter- died 44- childbirth Neg-breast,colon cancer; CAD, CVA, ovarian or other gyn cancer Family History of Diabetes: father  Social History: married 1955-1990 5 sons- '56, '57, '59, '62, '70 grandhcildren 10, 6 great-grandchildren Lives in own home, son lives with her. I-ADLs Discussed end of life  care: would not want resuscitation or being maintained with mechanical ventilation. Daily Caffeine Use 1 cup of coffe per day  Review of Systems       The patient complains of arthritis/joint pain, back pain, confusion, fatigue, hearing problems, muscle pains/cramps, night sweats, sleeping problems,  swelling of feet/legs, and urine leakage.         All other systems were reviewed and were negative   Vital Signs:  Patient profile:   77 year old female Height:      62 inches Weight:      158 pounds BMI:     29.00 Pulse rate:   88 / minute Pulse rhythm:   regular BP sitting:   142 / 68  (left arm) Cuff size:   regular  Vitals Entered By: Harlow Mares CMA Duncan Dull) (December 13, 2009 8:33 AM)  Physical Exam  Additional Exam:  She is a well-developed well-nourished female  skin: anicteric HEENT: normocephalic; PEERLA; no nasal or pharyngeal abnormalities neck: supple nodes: no cervical lymphadenopathy chest: clear to ausculatation and percussion heart: no murmurs, gallops, or rubs abd: soft, nontender; BS normoactive; no abdominal masses, tenderness, organomegaly rectal: deferred ext: no cynanosis, clubbing, edema skeletal: no deformities neuro: oriented x 3; no focal abnormalities    Impression & Recommendations:  Problem # 1:  ANEMIA, IRON DEFICIENCY (ICD-280.9)  Her anemia could be related to NSAID use which can cause erosions or ulcerations anywhere along GI tract.  Recommendations #1 upper endoscopy #2 stool Hemoccults #3 colonoscopy if upper endoscopy is not diagnostic for a GI bleeding source  Risks, alternatives, and complications of the procedure, including bleeding, perforation, and possible need for surgery, were explained to the patient.  Patient's questions were answered.  Orders: EGD (EGD)  Problem # 2:  GASTROESOPHAGEAL REFLUX DISEASE WITH STRICTURE (ICD-530.81)  Currently asymptomatic  Orders: EGD (EGD)  Problem # 3:  DIABETES MELLITUS, TYPE II (ICD-250.00) Assessment: Comment Only  Patient Instructions: 1)  Copy sent to : Micheal Norins,MD 2)  Your EGD is scheduled for 12/14/2009 at 2:30pm 3)   Please arrive on the 4th florr at 1:30pm 4)  You have been instructed on your diabetic meds 5)  You will go to the basement for hemoccult test  today 6)  The medication list was reviewed and reconciled.  All changed / newly prescribed medications were explained.  A complete medication list was provided to the patient / caregiver.

## 2010-07-21 NOTE — Letter (Signed)
Summary: Results Letter  [facility]  [address] [place], [place] [address]   Phone: [phone]  Fax: [phone]        December 16, 2009 MRN: [id number]    [facility] [address] [place], [place]  [address]    Dear Ms. Papp,   Your biopsies demonstrated inflammatory changes only.    Please follow the recommendations previously discussed.  Should you have any immediate concerns or questions, feel free to contact me at the office.    Sincerely,  Barbette Hair. Arlyce Dice, M.D., [facility]          Sincerely,  Louis Meckel MD  This letter has been electronically signed by your physician.  Appended Document: Results Letter letter mailed 7.6.11

## 2010-07-21 NOTE — Letter (Signed)
Summary: Diabetic Instructions   Gastroenterology  [address] [place], [place] [address]   Phone: [phone]  Fax: [phone]    Andrea Wilkinson 25-Apr-1932 MRN: [id number]   _  _   ORAL DIABETIC MEDICATION INSTRUCTIONS  The day before your procedure:   Take your diabetic pill as you do normally  The day of your procedure:   Do not take your diabetic pill    We will check your blood sugar levels during the admission process and again in Recovery before discharging you home  ________________________________________________________________________

## 2010-07-21 NOTE — Procedures (Signed)
Summary: EGD   EGD  Procedure date:  08/09/2000  Findings:      Findings: Stricture:  Location: [facility]    EGD  Procedure date:  08/09/2000  Findings:      Findings: Stricture:  Location: [facility]   Patient Name: Andrea Wilkinson, Andrea Wilkinson MRN: [id number] Procedure Procedures: Panendoscopy (EGD) CPT: 43235.    with esophageal dilation. CPT: G9296129.  Personnel: Endoscopist: Barbette Hair. Arlyce Dice, MD.  Exam Location: Exam performed in Endoscopy Suite.  Patient Consent: Procedure, Alternatives, Risks and Benefits discussed, consent obtained,  Indications Symptoms: Dysphagia.  History  Pre-Exam Physical: Performed Aug 09, 2000  Cardio-pulmonary exam, HEENT exam, Abdominal exam, Extremity exam, Neurological exam, Mental status exam WNL.  Exam Exam Info: Maximum depth of insertion Duodenum, intended Duodenum. ASA Classification: II. Tolerance: good.  Sedation Meds: Fentanyl 50 mcg. Versed 4 mg. Robinul 0.2 Cetacaine Spray 1 sprays  Monitoring: BP and pulse monitoring done. Oximetry used. Supplemental O2 given  Fluoroscopy: Fluoroscopy was not used.  Findings STRICTURE / STENOSIS: Stricture in Distal Esophagus.  Constriction: partial. 40 cm from mouth. ICD9: Esophageal Stricture: 530.3.  - Dilation: Distal Esophagus. Procedure was performed under Fluoroscopy. Savary dilator used, Diameter: 15-16-17 mm, Minimal Resistance, No Heme present on extraction.   Assessment Abnormal examination, see findings above.  Diagnoses: 530.3: Esophageal Stricture.   Events  Unplanned Intervention: No unplanned interventions were required.  Unplanned Events: There were no complications. Plans Medication(s): Continue current medications.  Disposition: After procedure patient sent to recovery.  Scheduling: Office Visit, to [employer]. Arlyce Dice, MD, around Sep 13, 2000.    This report was created from the original endoscopy report, which was reviewed and signed by  the above listed endoscopist.

## 2010-07-21 NOTE — Procedures (Signed)
Summary: EGD   EGD  Procedure date:  05/09/2006  Findings:      Findings: Stricture:  Location: [facility]   Patient Name: Andrea Wilkinson, Andrea Wilkinson MRN:  Procedure Procedures: Panendoscopy (EGD) CPT: 43235.    with esophageal dilation. CPT: G9296129.  Personnel: Endoscopist: Barbette Hair. Arlyce Dice, MD.  Indications Symptoms: Dysphagia.  History  Current Medications: Patient is not currently taking Coumadin.  Pre-Exam Physical: Performed May 09, 2006  Entire physical exam was normal.  Exam Exam Info: Maximum depth of insertion Duodenum, intended Duodenum. Vocal cords visualized. Gastric retroflexion performed. ASA Classification: II. Tolerance: good.  Sedation Meds: Robinul 0.2 given IV. Fentanyl 25 mcg. given IV. Versed 4 mg. given IV. Cetacaine Spray 2 sprays given aerosolized.  Monitoring: BP and pulse monitoring done. Oximetry used. Supplemental O2 given at 2 Liters.  Findings - Normal: Proximal Esophagus to Distal Esophagus.  - Normal: Fundus to Duodenal 2nd Portion.  STRICTURE / STENOSIS: Stricture in Distal Esophagus.  Constriction: partial. 40 cm from mouth. ICD9: Esophageal Stricture: 530.3.  - Dilation: Distal Esophagus. Balloon/Microvasive dilator used, Diameter: 15-16.5-18 mm, Moderate Resistance, No Heme present on extraction. Outcome: successful.   Assessment Abnormal examination, see findings above.  Diagnoses: 530.3: Esophageal Stricture.   Events  Unplanned Intervention: No unplanned interventions were required.  Unplanned Events: There were no complications. Plans Medication(s): Continue current medications.  Patient Education: Patient given standard instructions for: Stenosis / Stricture.  Scheduling: Follow-up prn.   This report was created from the original endoscopy report, which was reviewed and signed by the above listed endoscopist.    cc. Micheal Norins,MD

## 2010-07-21 NOTE — Miscellaneous (Signed)
Summary: [facility]  Clinical Lists Changes  Medications: Added new medication of MOVIPREP 100 GM  SOLR (PEG-KCL-NACL-NASULF-NA ASC-C) As per prep instructions. - Signed Rx of MOVIPREP 100 GM  SOLR (PEG-KCL-NACL-NASULF-NA ASC-C) As per prep instructions.;  #1 x 0;  Signed;  Entered by: Ezra Sites RN;  Authorized by: Louis Meckel MD;  Method used: Electronically to [facility] [address] [place], [place], [place]  [address], Ph: [phone], Fax: [phone] Allergies: Changed allergy or adverse reaction from CODEINE to CODEINE Changed allergy or adverse reaction from ASPIRIN to ASPIRIN    Prescriptions: MOVIPREP 100 GM  SOLR (PEG-KCL-NACL-NASULF-NA ASC-C) As per prep instructions.  #1 x 0   Entered by:   Ezra Sites RN   Authorized by:   Louis Meckel MD   Signed by:   Ezra Sites RN on 01/10/2010   Method used:   Electronically to        [facility] (retail)       [address]       [place], [place]  [address]       Ph: [phone]       Fax: [phone]   RxID:   [id number]

## 2010-07-21 NOTE — Progress Notes (Signed)
  Phone Note Outgoing Call   Reason for Call: Discuss lab or test results Summary of Call: Please call patient - hgb is 13 - normal and iron level is normal. A1C 8% - too high. Increase metformin to 1000mg  two times a day ( 2x500 until gone then new Rx). Please update med list and send in new Rx.  Initial call taken by: Jacques Navy MD,  June 07, 2010 5:43 AM  Follow-up for Phone Call        pt informed Follow-up by: Ami Bullins CMA,  June 07, 2010 4:39 PM    New/Updated Medications: METFORMIN HCL 1000 MG TABS (METFORMIN HCL) 1 tablet two times a day Prescriptions: METFORMIN HCL 1000 MG TABS (METFORMIN HCL) 1 tablet two times a day  #120 x 1   Entered by:   Ami Bullins CMA   Authorized by:   Jacques Navy MD   Signed by:   Bill Salinas CMA on 06/07/2010   Method used:   Electronically to        [facility]* (retail)       [address] [place], [place]  [address]       Ph: [phone]       Fax: [phone]   RxID:   [id number]

## 2010-07-21 NOTE — Procedures (Signed)
Summary: colonoscopy   Colonoscopy  Procedure date:  06/21/2004  Findings:      Results: Diverticulosis.       Location:  [facility].    Comments:      Repeat colonoscopy in 7 years Patient Name: Andrea Wilkinson, Andrea Wilkinson MRN: [id number] Procedure Procedures: Colorectal cancer screening, average risk CPT: G0121.  Personnel: Endoscopist: Barbette Hair. Arlyce Dice, MD.  Patient Consent: Procedure, Alternatives, Risks and Benefits discussed, consent obtained, from patient.  Indications  Average Risk Screening Routine.  History  Current Medications: Patient is not currently taking Coumadin.  Pre-Exam Physical: Performed Jun 21, 2004. Entire physical exam was normal.  Exam Exam: Extent of exam reached: Cecum, extent intended: Cecum.  The cecum was identified by IC valve. Colon retroflexion performed. ASA Classification: II. Tolerance: good.  Monitoring: Pulse and BP monitoring, Oximetry used. Supplemental O2 given. at 2 Liters.  Colon Prep Used Miralax for colon prep. Prep results: good.  Sedation Meds: Patient assessed and found to be appropriate for moderate (conscious) sedation. Sedation was managed by the Endoscopist. Fentanyl 25 mcg. given IV. Versed 5 mg. given IV.  Findings - DIVERTICULOSIS: Ascending Colon to Sigmoid Colon. ICD9: Diverticulosis: 562.10. Comments: Diffuse diverticular changes.  NORMAL EXAM: Cecum.  NORMAL EXAM: Rectum.   Assessment Abnormal examination, see findings above.  Diagnoses: 562.10: Diverticulosis.   Events  Unplanned Interventions: No intervention was required.  Unplanned Events: There were no complications. Plans  Post Exam Instructions: Post sedation instructions given.  Patient Education: Patient given standard instructions for: Diverticulosis.  Scheduling/Referral: Colonoscopy, to Barbette Hair. Arlyce Dice, MD, around Jun 22, 2011.    This report was created from the original endoscopy report, which was reviewed and signed by  the above listed endoscopist.    cc. Micheal Norins,MD

## 2010-07-21 NOTE — Medication Information (Signed)
[facility]   Imported By: L[place] 12/29/2009 09:16:01  _____________________________________________________________________  External Attachment:    Type:   Image     Comment:   External Document

## 2010-07-21 NOTE — Progress Notes (Signed)
  Phone Note Other Incoming   Request: Send information Summary of Call: Faxed Surgical Clearance to [phone] Attn: Agustin Cree

## 2010-07-21 NOTE — Progress Notes (Signed)
  Phone Note Refill Request Message from:  Fax from Pharmacy on Nov 11, 2009 4:03 PM  Refills Requested: Medication #1:  INDOMETHACIN 25 MG  CAPS Take 1 tab q6 as needed  Medication #2:  CYCLOBENZAPRINE HCL 5 MG TABS 1 by mouth three times a day for back pain.. Initial call taken by: Ami Bullins CMA,  Nov 11, 2009 4:03 PM    Prescriptions: CYCLOBENZAPRINE HCL 5 MG TABS (CYCLOBENZAPRINE HCL) 1 by mouth three times a day for back pain.  #30 x 2   Entered by:   Bill Salinas CMA   Authorized by:   Jacques Navy MD   Signed by:   Bill Salinas CMA on 11/11/2009   Method used:   Electronically to        [facility]* (retail)       [address] [place], [place]  [address]       Ph: [phone]       Fax: [phone]   RxID:   [id number] INDOMETHACIN 25 MG  CAPS (INDOMETHACIN) Take 1 tab q6 as needed  #30 Each x 2   Entered by:   Bill Salinas CMA   Authorized by:   Jacques Navy MD   Signed by:   Bill Salinas CMA on 11/11/2009   Method used:   Electronically to        [facility] (retail)       [address] [place], [place]  [address]       Ph: [phone]       Fax: [phone]   RxID:   [id number]

## 2010-07-21 NOTE — Progress Notes (Signed)
  Phone Note Outgoing Call   Reason for Call: Discuss lab or test results Summary of Call: call patient - stop glucotrol. Needs OV Thursday or Friday to review labs, monitor condition and discuss change in diabetes management.  Thanks Initial call taken by: Jacques Navy MD,  Nov 05, 2009 5:42 PM  Follow-up for Phone Call        pt sch for friday 11/12/2009 Follow-up by: Ami Bullins CMA,  Nov 08, 2009 3:10 PM

## 2010-07-21 NOTE — Assessment & Plan Note (Signed)
Summary: fu--d/t--back problem--meds-stc   Vital Signs:  Patient profile:   77 year old female Height:      62 inches Weight:      170 pounds BMI:     31.21 O2 Sat:      96 % on Room air Temp:     98.3 degrees F oral Pulse rate:   81 / minute BP sitting:   138 / 72  (left arm) Cuff size:   regular  Vitals Entered By: Bill Salinas CMA (August 02, 2009 10:33 AM)  O2 Flow:  Room air CC: Pt here for med refill her insurance has changed and she now uses [facility]. Pt also complains of low back pain x 6weeks/ she has never had Zostavax and states she has never had a pap/ ab   Primary Care Provider:  Micaila Ziemba  CC:  Pt here for med refill her insurance has changed and she now uses [facility]. Pt also complains of low back pain x 6weeks/ she has never had Zostavax and states she has never had a pap/ ab.  History of Present Illness: Patient presents for medication refills. She also is c/o low back pain. She ha shad two weeks of pain. She rates the pain as a 6-7/10. No radiation to legs. She does report that when she walks she will feel like her legs weight a ton. She has a 5-10 min recovery time with sitting. She has continued taking tramadol and mobic with good results but now is having pain.   She has been well controlled in regard to DM with last A1C 6.9% in Nov '11 (chart reviewed). Likewise, lipids have been well controlled with LDL of 89.   Current Medications (verified): 1)  Enalapril Maleate 20 Mg Tabs (Enalapril Maleate) .... Take 1 Tablet By Mouth Once A Day 2)  Glucotrol 5 Mg  Tabs (Glipizide) .... Take One Tablet Once Daily 3)  Tramadol Hcl 50 Mg Tabs (Tramadol Hcl) .... Take 1-2 Tablet By Mouth Four Times A Day 4)  Mobic 15 Mg  Tabs (Meloxicam) .Marland Kitchen.. 1 Once Daily 5)  Lasix 20 Mg  Tabs (Furosemide) .... Take One Tablet Once Daily 6)  Caltrate 600+d Plus 600-400 Mg-Unit  Tabs (Calcium Carbonate-Vit D-Min) .... Take One Tablet Twice Daily 7)  Indomethacin 25 Mg   Caps (Indomethacin) .... Take 1 Tab Q6 As Needed 8)  Belladonna Alk-Phenobarbital 16.2 Mg  Tabs (Belladonna Alk-Phenobarbital) .Marland Kitchen.. 1 By Mouth As Needed Q12 9)  Metformin Hcl 500 Mg  Tabs (Metformin Hcl) .Marland Kitchen.. 1 By Mouth Two Times A Day 10)  Benzonatate 100 Mg  Caps (Benzonatate) .Marland Kitchen.. 1 Three Times A Day For Cough As Needed. 11)  Omeprazole 20 Mg  Tbec (Omeprazole) .... Take 1 Tablet By Mouth Every Morning As Needed 12)  Diclofenac Sodium 75 Mg  Tbec (Diclofenac Sodium) .... Take 1 Tablet By Mouth Once A Day  Allergies (verified): 1)  ! Codeine 2)  ! Aspirin  Past History:  Past Medical History: Last updated: 06/07/2007 GASTROESOPHAGEAL REFLUX DISEASE WITH STRICTURE (ICD-530.81) HYPERTENSION (ICD-401.9) Hx of ARTHRITIS, CERVICAL SPINE (ICD-721.90) DEGENERATIVE JOINT DISEASE (ICD-715.90) DIABETES MELLITUS, TYPE II (ICD-250.00)    Past Surgical History: Last updated: 06/07/2007 HYSTERECTOMY, HX OF (ICD-V45.77) DILATION AND CURETTAGE, HX OF (ICD-V45.89) TUBAL LIGATION, HX OF (ICD-V26.51) * LEFT BREAST LUMPECTOMY * BENIGN TUMOR REMOVED FROM LEFT HIP CHOLECYSTECTOMY, HX OF (ICD-V45.79)  Family History: Last updated: 06-17-2007 father- died 48; suicide mohter- died 47- childbirth Neg-breast,colon cancer;  CAD, CVA, ovarian or other gyn cancer  Social History: Last updated: 05/06/2009 married 1955-1990 5 sons- '56, '57, '59, '62, '70 grandhcildren 10, 6 great-grandchildren Lives in own home, son lives with her. I-ADLs Discussed end of life care: would not want resuscitation or being maintained with mechanical ventilation.  Risk Factors: Exercise: yes (05/20/2008)  Risk Factors: Smoking Status: never (06/07/2007)  Review of Systems  The patient denies anorexia, fever, weight loss, weight gain, chest pain, syncope, dyspnea on exertion, prolonged cough, hemoptysis, abdominal pain, severe indigestion/heartburn, suspicious skin lesions, difficulty walking, depression, and  angioedema.    Physical Exam  General:  Well-developed,well-nourished,in no acute distress; alert,appropriate and cooperative throughout examination Head:  Normocephalic and atraumatic without obvious abnormalities. No apparent alopecia or balding. Eyes:  No corneal or conjunctival inflammation noted. EOMI. Perrla. Funduscopic exam benign, without hemorrhages, exudates or papilledema. Vision grossly normal. Msk:  back exam: nl stand, nl flex, nl gait, nl toe/heel walk, minimal assist to step up, nl SLR sitting, nl DTRs patellar tendon, nl sensatin. Mild tenderness in the paravertral muscles. Pulses:  2+ radial, 2+ DP pulses Extremities:  No clubbing, cyanosis, edema, or deformity noted with normal full range of motion of all joints.   Neurologic:  alert & oriented X3, strength normal in all extremities, gait normal, and DTRs symmetrical and normal.   Skin:  turgor normal and color normal.   Psych:  Oriented X3 and memory intact for recent and remote.     Impression & Recommendations:  Problem # 1:  HYPERTENSION (ICD-401.9)  Her updated medication list for this problem includes:    Enalapril Maleate 20 Mg Tabs (Enalapril maleate) .Marland Kitchen... Take 1 tablet by mouth once a day    Lasix 20 Mg Tabs (Furosemide) .Marland Kitchen... Take one tablet once daily  BP today: 138/72 Prior BP: 130/76 (05/06/2009)  Adequate control. Continue present medicaitons.   Problem # 2:  DIABETES MELLITUS, TYPE II (ICD-250.00) Good control  with last A1C @ 6.9%. Lipids good with LDL 89.  Plan - continue present medications.  Her updated medication list for this problem includes:    Enalapril Maleate 20 Mg Tabs (Enalapril maleate) .Marland Kitchen... Take 1 tablet by mouth once a day    Glucotrol 5 Mg Tabs (Glipizide) .Marland Kitchen... Take one tablet once daily    Metformin Hcl 500 Mg Tabs (Metformin hcl) .Marland Kitchen... 1 by mouth two times a day  Problem # 3:  LOW BACK PAIN, ACUTE (ICD-724.2) Patient with a normal back exam without radiculopathy. Suspect  MSK pain.  Paln - patient takes mobic and tramadol for arthritic pain. she is advised to not take multiple NSAIDs at a time           May add APAP 100mg  three times a day for pain           Flexeril 5mg  three times a day for back pain.  Her updated medication list for this problem includes:    Tramadol Hcl 50 Mg Tabs (Tramadol hcl) .Marland Kitchen... Take 1-2 tablet by mouth four times a day    Mobic 15 Mg Tabs (Meloxicam) .Marland Kitchen... 1 once daily    Indomethacin 25 Mg Caps (Indomethacin) .Marland Kitchen... Take 1 tab q6 as needed    Diclofenac Sodium 75 Mg Tbec (Diclofenac sodium) .Marland Kitchen... Take 1 tablet by mouth once a day    Cyclobenzaprine Hcl 5 Mg Tabs (Cyclobenzaprine hcl) .Marland Kitchen... 1 by mouth three times a day for back pain.  Complete Medication List: 1)  Enalapril Maleate 20 Mg Tabs (  Enalapril maleate) .... Take 1 tablet by mouth once a day 2)  Glucotrol 5 Mg Tabs (Glipizide) .... Take one tablet once daily 3)  Tramadol Hcl 50 Mg Tabs (Tramadol hcl) .... Take 1-2 tablet by mouth four times a day 4)  Mobic 15 Mg Tabs (Meloxicam) .Marland Kitchen.. 1 once daily 5)  Lasix 20 Mg Tabs (Furosemide) .... Take one tablet once daily 6)  Caltrate 600+d Plus 600-400 Mg-unit Tabs (Calcium carbonate-vit d-min) .... Take one tablet twice daily 7)  Indomethacin 25 Mg Caps (Indomethacin) .... Take 1 tab q6 as needed 8)  Belladonna Alk-phenobarbital 16.2 Mg Tabs (Belladonna alk-phenobarbital) .Marland Kitchen.. 1 by mouth as needed q12 9)  Metformin Hcl 500 Mg Tabs (Metformin hcl) .Marland Kitchen.. 1 by mouth two times a day 10)  Benzonatate 100 Mg Caps (Benzonatate) .Marland Kitchen.. 1 three times a day for cough as needed. 11)  Omeprazole 20 Mg Tbec (Omeprazole) .... Take 1 tablet by mouth every morning as needed 12)  Diclofenac Sodium 75 Mg Tbec (Diclofenac sodium) .... Take 1 tablet by mouth once a day 13)  Cyclobenzaprine Hcl 5 Mg Tabs (Cyclobenzaprine hcl) .Marland Kitchen.. 1 by mouth three times a day for back pain.  Other Orders: Prescription Created Electronically [id number])  Patient  Instructions: 1)  low back pain - no evidence of any pinched nerve or herniated disk. OK to continue present medications. DO NOT TAKE MULTIPLE ARTHRITIS MEDICATIONS AT THE SAME TIME. OK to add tylenol 1000mg  three times a day to help with back pain. Careful with lifiting. Cyclobenzaprine 5mg  three times a day for muscle spasm and pain in the back. Watch for drowsiness.  2)  All Rx sent to [facility] Prescriptions: OMEPRAZOLE 20 MG  TBEC (OMEPRAZOLE) Take 1 tablet by mouth every morning as needed  #90 x 3   Entered and Authorized by:   Jacques Navy MD   Signed by:   Jacques Navy MD on 08/02/2009   Method used:   Faxed to ...       [facility] (mail-order)       [address]       [place], [place]  [id number]       Ph: [phone]       Fax: [phone]   RxID:   [id number] BENZONATATE 100 MG  CAPS (BENZONATATE) 1 three times a day for cough as needed.  #30 Each x 3   Entered and Authorized by:   Jacques Navy MD   Signed by:   Jacques Navy MD on 08/02/2009   Method used:   Faxed to ...       [facility] (mail-order)       [address]       [place], [place]  [id number]       Ph: [phone]       Fax: [phone]   RxID:   [id number] BELLADONNA ALK-PHENOBARBITAL 16.2 MG  TABS (BELLADONNA ALK-PHENOBARBITAL) 1 by mouth as needed q12  #60 x 5   Entered and Authorized by:   Jacques Navy MD   Signed by:   Jacques Navy MD on 08/02/2009   Method used:   Faxed to ...       [facility] (mail-order)       [address]       [place], [place]  [id number]       Ph: [phone]       Fax: [phone]   RxID:   [id number]  METFORMIN HCL 500 MG  TABS (METFORMIN HCL) 1 by mouth two times a day  #180 x 3   Entered and Authorized by:   Jacques Navy MD   Signed by:   Jacques Navy MD on 08/02/2009   Method used:   Faxed to ...       [facility] (mail-order)       [address]        [place], [place]  [id number]       Ph: [phone]       Fax: [phone]   RxID:   [id number] LASIX 20 MG  TABS (FUROSEMIDE) Take one tablet once daily  #90 x 3   Entered and Authorized by:   Jacques Navy MD   Signed by:   Jacques Navy MD on 08/02/2009   Method used:   Faxed to ...       [facility] (mail-order)       [address]       [place], [place]  [id number]       Ph: [phone]       Fax: [phone]   RxID:   [id number] MOBIC 15 MG  TABS (MELOXICAM) 1 once daily  #90 x 3   Entered and Authorized by:   Jacques Navy MD   Signed by:   Jacques Navy MD on 08/02/2009   Method used:   Faxed to ...       [facility] (mail-order)       [address]       [place], [place]  [id number]       Ph: [phone]       Fax: [phone]   RxID:   [id number] TRAMADOL HCL 50 MG TABS (TRAMADOL HCL) Take 1-2 tablet by mouth four times a day  #240 x 3   Entered and Authorized by:   Jacques Navy MD   Signed by:   Jacques Navy MD on 08/02/2009   Method used:   Faxed to ...       [facility] (mail-order)       [address]       [place], [place]  [id number]       Ph: [phone]       Fax: [phone]   RxID:   [id number] GLUCOTROL 5 MG  TABS (GLIPIZIDE) Take one tablet once daily  #90 x 3   Entered and Authorized by:   Jacques Navy MD   Signed by:   Jacques Navy MD on 08/02/2009   Method used:   Faxed to ...       [facility] (mail-order)       [address]       [place], [place]  [id number]       Ph: [phone]       Fax: [phone]   RxID:   [id number] ENALAPRIL MALEATE 20 MG TABS (ENALAPRIL MALEATE) Take 1 tablet by mouth once a day  #90 x 3   Entered and Authorized by:   Jacques Navy MD   Signed by:   Jacques Navy MD on 08/02/2009   Method used:   Faxed to ...       [facility] (mail-order)       [address]       [place], [place]   [id number]       Ph: [phone]  Fax: [phone]   RxID:   [id number] CYCLOBENZAPRINE HCL 5 MG TABS (CYCLOBENZAPRINE HCL) 1 by mouth three times a day for back pain.  #30 x 2   Entered and Authorized by:   Jacques Navy MD   Signed by:   Jacques Navy MD on 08/02/2009   Method used:   Faxed to ...       [facility] (mail-order)       [address]       [place], [place]  [id number]       Ph: [phone]       Fax: [phone]   RxID:   [id number] OMEPRAZOLE 20 MG  TBEC (OMEPRAZOLE) Take 1 tablet by mouth every morning as needed  #90 x 3   Entered and Authorized by:   Jacques Navy MD   Signed by:   Jacques Navy MD on 08/02/2009   Method used:   Electronically to        [facility].* (retail)       [address]       [place], [place]  [address]       Ph: [phone]       Fax: [phone]   RxID:   [id number] BENZONATATE 100 MG  CAPS (BENZONATATE) 1 three times a day for cough as needed.  #30 Each x 3   Entered and Authorized by:   Jacques Navy MD   Signed by:   Jacques Navy MD on 08/02/2009   Method used:   Electronically to        [facility].* (retail)       [address]       [place], [place]  [address]       Ph: [phone]       Fax: [phone]   RxID:   [id number] METFORMIN HCL 500 MG  TABS (METFORMIN HCL) 1 by mouth two times a day  #180 x 3   Entered and Authorized by:   Jacques Navy MD   Signed by:   Jacques Navy MD on 08/02/2009   Method used:   Electronically to        [facility].* (retail)       [address]       [place], [place]  [address]       Ph: [phone]       Fax: [phone]   RxID:   [id number] BELLADONNA ALK-PHENOBARBITAL 16.2 MG  TABS (BELLADONNA ALK-PHENOBARBITAL) 1 by mouth as needed q12  #60 x 5   Entered and Authorized by:   Jacques Navy MD    Signed by:   Jacques Navy MD on 08/02/2009   Method used:   Electronically to        [facility] (retail)       [address]       [place], [place]  [address]       Ph: [phone]       Fax: [phone]   RxID:   [id number] LASIX 20 MG  TABS (FUROSEMIDE) Take one tablet once daily  #90 x 3   Entered and Authorized by:   Jacques Navy MD  Signed by:   Jacques Navy MD on 08/02/2009   Method used:   Electronically to        [facility] (retail)       [address]       [place], [place]  [address]       Ph: [phone]       Fax: [phone]   RxID:   [id number] MOBIC 15 MG  TABS (MELOXICAM) 1 once daily  #90 x 3   Entered and Authorized by:   Jacques Navy MD   Signed by:   Jacques Navy MD on 08/02/2009   Method used:   Electronically to        [facility] (retail)       [address]       [place], [place]  [address]       Ph: [phone]       Fax: [phone]   RxID:   [id number] TRAMADOL HCL 50 MG TABS (TRAMADOL HCL) Take 1-2 tablet by mouth four times a day  #240 x 3   Entered and Authorized by:   Jacques Navy MD   Signed by:   Jacques Navy MD on 08/02/2009   Method used:   Electronically to        [facility].* (retail)       [address]       [place], [place]  [address]       Ph: [phone]       Fax: [phone]   RxID:   [id number] GLUCOTROL 5 MG  TABS (GLIPIZIDE) Take one tablet once daily  #90 x 3   Entered and Authorized by:   Jacques Navy MD   Signed by:   Jacques Navy MD on 08/02/2009   Method used:   Electronically to        [facility].* (retail)       [address]       [place], [place]  [address]       Ph: [phone]       Fax: [phone]   RxID:   [id number] ENALAPRIL MALEATE 20 MG TABS  (ENALAPRIL MALEATE) Take 1 tablet by mouth once a day  #90 x 3   Entered and Authorized by:   Jacques Navy MD   Signed by:   Jacques Navy MD on 08/02/2009   Method used:   Electronically to        [facility].* (retail)       [address]       [place], [place]  [address]       Ph: [phone]       Fax: [phone]   RxID:   [id number]    Preventive Care Screening  Bone Density:    Date:  05/10/2009    Results:  abnormal std dev   Preventive Care Screening  Bone Density:    Date:  05/10/2009    Results:  abnormal std dev

## 2010-07-21 NOTE — Assessment & Plan Note (Signed)
Summary: FLU SHOT/PN  Nurse Visit   Vital Signs:  Patient profile:   77 year old female Temp:     98.0 degrees F oral  Vitals Entered By: Lanier Prude, CMA(AAMA) (April 26, 2010 10:13 AM)  Allergies: 1)  ! Codeine 2)  ! Aspirin  Orders Added: 1)  Flu Vaccine 38yrs + MEDICARE PATIENTS [Q2039] 2)  Administration Flu vaccine - MCR [G0008]        Flu Vaccine Consent Questions     Do you have a history of severe allergic reactions to this vaccine? no    Any prior history of allergic reactions to egg and/or gelatin? no    Do you have a sensitivity to the preservative Thimersol? no    Do you have a past history of Guillan-Barre Syndrome? no    Do you currently have an acute febrile illness? no    Have you ever had a severe reaction to latex? no    Vaccine information given and explained to patient? yes    Are you currently pregnant? no    Lot Number:[id number]   Exp Date:12/17/2010   Site Given Right Deltoid IM Lanier Prude, [facility])  April 26, 2010 10:14 AM

## 2010-07-21 NOTE — Letter (Signed)
Summary: EGD Instructions  [facility]  [address] [place], [place] [address]   Phone: [phone]  Fax: [phone]       Andrea Wilkinson    Apr 23, 1932    MRN: [id number]       Procedure Day /Date:TUESDAY 12/14/2009     Arrival Time: 1:30PM     Procedure Time:2:30PM     Location of Procedure:                    X  [facility] (4th Floor)    PREPARATION FOR ENDOSCOPY   On 12/14/2009 THE DAY OF THE PROCEDURE:  1.   No solid foods, milk or milk products are allowed after midnight the night before your procedure.  2.   Do not drink anything colored red or purple.  Avoid juices with pulp.  No orange juice.  3.  You may drink clear liquids until12;30PM, which is 2 hours before your procedure.                                                                                                CLEAR LIQUIDS INCLUDE: Water Jello Ice Popsicles Tea (sugar ok, no milk/cream) Powdered fruit flavored drinks Coffee (sugar ok, no milk/cream) Gatorade Juice: apple, white grape, white cranberry  Lemonade Clear bullion, consomm, broth Carbonated beverages (any kind) Strained chicken noodle soup Hard Candy   MEDICATION INSTRUCTIONS  Unless otherwise instructed, you should take regular prescription medications with a small sip of water as early as possible the morning of your procedure.  Diabetic patients - see separate instructions.            OTHER INSTRUCTIONS  You will need a responsible adult at least 75 years of age to accompany you and drive you home.   This person must remain in the waiting room during your procedure.  Wear loose fitting clothing that is easily removed.  Leave jewelry and other valuables at home.  However, you may wish to bring a book to read or an iPod/MP3 player to listen to music as you wait for your procedure to start.  Remove all body piercing jewelry and leave at home.  Total time from sign-in until discharge is  approximately 2-3 hours.  You should go home directly after your procedure and rest.  You can resume normal activities the day after your procedure.  The day of your procedure you should not:   Drive   Make legal decisions   Operate machinery   Drink alcohol   Return to work  You will receive specific instructions about eating, activities and medications before you leave.    The above instructions have been reviewed and explained to me by   _______________________    I fully understand and can verbalize these instructions _____________________________ Date _________

## 2010-07-21 NOTE — Letter (Signed)
[facility] [address] [place], [place]  [address] Phone: [phone]      Nov 08, 2009   [facility] [address] [place], [place] [address]  RE:  LAB RESULTS  Dear  [name],  The following is an interpretation of your most recent lab tests.  Please take note of any instructions provided or changes to medications that have resulted from your lab work.  ELECTROLYTES:  Good - no changes needed  KIDNEY FUNCTION TESTS:  Good - no changes needed    DIABETIC STUDIES:  Fair - schedule a follow-up appointment Blood Glucose: 142   HgbA1C: 7.8     CBC:  Good - no changes needed   Blood counts and chemistries are good, but your blood sugar is running too high.   Sincerely Yours,    Jacques Navy MD  atient: Andrea Wilkinson Note: All result statuses are Final unless otherwise noted.  Tests: (1) Hemoglobin A1C (A1C)   Hemoglobin A1C       [H]  7.8 %                       4.6-6.5     Glycemic Control Guidelines for People with Diabetes:     Non Diabetic:  <6%     Goal of Therapy: <7%     Additional Action Suggested:  >8%   Tests: (2) BMP (METABOL)   Sodium                    141 mEq/L                   135-145   Potassium                 4.9 mEq/L                   3.5-5.1   Chloride                  99 mEq/L                    96-112   Carbon Dioxide            31 mEq/L                    19-32   Glucose              [H]  142 mg/dL                   29-56   BUN                       14 mg/dL                    2-13   Creatinine                0.8 mg/dL                   0.8-6.5   Calcium                   9.9 mg/dL                   7.8-46.9   GFR  79.83 mL/min                >60  Tests: (3) CBC Platelet w/Diff (CBCD)   White Cell Count     [H]  12.5 K/uL                   4.5-10.5   Red Cell Count            4.10 Mil/uL                 3.87-5.11   Hemoglobin                12.5 g/dL                   30.8-65.7  Hematocrit                37.4 %                      36.0-46.0   MCV                       91.2 fl                     78.0-100.0   MCHC                      33.3 g/dL                   84.6-96.2   RDW                       13.9 %                      11.5-14.6   Platelet Count       [H]  474.0 K/uL                  150.0-400.0   Neutrophil %              71.8 %                      43.0-77.0   Lymphocyte %              21.7 %                      12.0-46.0   Monocyte %                3.8 %                       3.0-12.0   Eosinophils%              2.2 %                       0.0-5.0   Basophils %               0.5 %                       0.0-3.0   Neutrophill Absolute [H]  9.0 K/uL                    1.4-7.7   Lymphocyte Absolute       2.7 K/uL  0.7-4.0   Monocyte Absolute         0.5 K/uL                    0.1-1.0  Eosinophils, Absolute                             0.3 K/uL                    0.0-0.7   Basophils Absolute        0.1 K/uL                    0.0-0.1

## 2010-07-21 NOTE — Assessment & Plan Note (Signed)
Summary: ov to discuss labs   Vital Signs:  Patient profile:   77 year old female Height:      62 inches Weight:      165 pounds BMI:     30.29 O2 Sat:      95 % on Room air Temp:     97.3 degrees F oral Pulse rate:   89 / minute BP sitting:   148 / 82  (left arm) Cuff size:   regular  Vitals Entered By: Bill Salinas CMA (Nov 12, 2009 2:24 PM)  O2 Flow:  Room air CC: office visit to discuss labs pt needs refill on indomethacin and cyclobenzaprine sent to [facility]   Primary Care Provider:  Ysabel Stankovich  CC:  office visit to discuss labs pt needs refill on indomethacin and cyclobenzaprine sent to [facility].  History of Present Illness: Patient last seen May 20: she had an elevated A1C but had been having night sweats and I was concerned about hypoglycemic events ocurring in the middle of the night. She was to have stopped her glucotrol, however she has continued glucotrol without further incidents.  She does have iron deficiency anemia and is to be seen by GI.  Current Medications (verified): 1)  Enalapril Maleate 20 Mg Tabs (Enalapril Maleate) .... Take 1 Tablet By Mouth Once A Day 2)  Glucotrol 5 Mg  Tabs (Glipizide) .... Take One Tablet Once Daily 3)  Tramadol Hcl 50 Mg Tabs (Tramadol Hcl) .... Take 1-2 Tablet By Mouth Four Times A Day 4)  Mobic 15 Mg  Tabs (Meloxicam) .Marland Kitchen.. 1 Once Daily 5)  Lasix 20 Mg  Tabs (Furosemide) .... Take One Tablet Once Daily 6)  Caltrate 600+d Plus 600-400 Mg-Unit  Tabs (Calcium Carbonate-Vit D-Min) .... Take One Tablet Twice Daily 7)  Indomethacin 25 Mg  Caps (Indomethacin) .... Take 1 Tab Q6 As Needed 8)  Belladonna Alk-Phenobarbital 16.2 Mg  Tabs (Belladonna Alk-Phenobarbital) .Marland Kitchen.. 1 By Mouth As Needed Q12 9)  Metformin Hcl 500 Mg  Tabs (Metformin Hcl) .Marland Kitchen.. 1 By Mouth Two Times A Day 10)  Benzonatate 100 Mg  Caps (Benzonatate) .Marland Kitchen.. 1 Three Times A Day For Cough As Needed. 11)  Omeprazole 20 Mg  Tbec (Omeprazole) .... Take 1 Tablet By  Mouth Every Morning As Needed 12)  Diclofenac Sodium 75 Mg  Tbec (Diclofenac Sodium) .... Take 1 Tablet By Mouth Once A Day 13)  Cyclobenzaprine Hcl 5 Mg Tabs (Cyclobenzaprine Hcl) .Marland Kitchen.. 1 By Mouth Three Times A Day For Back Pain.  Allergies (verified): 1)  ! Codeine 2)  ! Aspirin PMH-FH-SH reviewed-no changes except otherwise noted  Review of Systems  The patient denies anorexia, weight loss, weight gain, decreased hearing, chest pain, dyspnea on exertion, peripheral edema, and abdominal pain.    Physical Exam  General:  overweight white female in no distress Head:  Normocephalic and atraumatic without obvious abnormalities. No apparent alopecia or balding. Lungs:  normal respiratory effort.   Heart:  normal rate and regular rhythm.     Impression & Recommendations:  Problem # 1:  ANEMIA, IRON DEFICIENCY (ICD-280.9) Continue iron supplement and keep GI appointment June 27th with Dr. Arlyce Dice.  Problem # 2:  NIGHT SWEATS (ICD-780.8) Resolved  Problem # 3:  HYPERTENSION (ICD-401.9)  Her updated medication list for this problem includes:    Enalapril Maleate 20 Mg Tabs (Enalapril maleate) .Marland Kitchen... Take 1 tablet by mouth once a day    Lasix 20 Mg Tabs (Furosemide) .Marland Kitchen... Take  one tablet once daily  BP today: 148/82 Prior BP: 160/98 (11/05/2009)  Labs Reviewed: K+: 4.9 (11/05/2009) Creat: : 0.8 (11/05/2009)     Continue presnet medications  Problem # 4:  DIABETES MELLITUS, TYPE II (ICD-250.00) Elevated A1C.   Plan - contnue present medications and pay close attention to diet.   Her updated medication list for this problem includes:    Enalapril Maleate 20 Mg Tabs (Enalapril maleate) .Marland Kitchen... Take 1 tablet by mouth once a day    Glucotrol 5 Mg Tabs (Glipizide) .Marland Kitchen... Take one tablet once daily    Metformin Hcl 500 Mg Tabs (Metformin hcl) .Marland Kitchen... 1 by mouth two times a day  Complete Medication List: 1)  Enalapril Maleate 20 Mg Tabs (Enalapril maleate) .... Take 1 tablet by mouth  once a day 2)  Glucotrol 5 Mg Tabs (Glipizide) .... Take one tablet once daily 3)  Tramadol Hcl 50 Mg Tabs (Tramadol hcl) .... Take 1-2 tablet by mouth four times a day 4)  Mobic 15 Mg Tabs (Meloxicam) .Marland Kitchen.. 1 once daily 5)  Lasix 20 Mg Tabs (Furosemide) .... Take one tablet once daily 6)  Caltrate 600+d Plus 600-400 Mg-unit Tabs (Calcium carbonate-vit d-min) .... Take one tablet twice daily 7)  Indomethacin 25 Mg Caps (Indomethacin) .... Take 1 tab q6 as needed 8)  Belladonna Alk-phenobarbital 16.2 Mg Tabs (Belladonna alk-phenobarbital) .Marland Kitchen.. 1 by mouth as needed q12 9)  Metformin Hcl 500 Mg Tabs (Metformin hcl) .Marland Kitchen.. 1 by mouth two times a day 10)  Benzonatate 100 Mg Caps (Benzonatate) .Marland Kitchen.. 1 three times a day for cough as needed. 11)  Omeprazole 20 Mg Tbec (Omeprazole) .... Take 1 tablet by mouth every morning as needed 12)  Diclofenac Sodium 75 Mg Tbec (Diclofenac sodium) .... Take 1 tablet by mouth once a day 13)  Cyclobenzaprine Hcl 5 Mg Tabs (Cyclobenzaprine hcl) .Marland Kitchen.. 1 by mouth three times a day for back pain.

## 2010-07-21 NOTE — Letter (Signed)
Summary: [facility]: Immunoassay Fecal Occult Blood (iFOB) Order [facility]  [address] [place], [place] [address]   Phone: [phone]  Fax: [phone]      [facility]: Immunoassay Fecal Occult Blood (iFOB) Order Form   December 13, 2009 MRN: [id number]   Andrea Wilkinson 1931-11-17   Physicican Name:Husain Costabile,MD  Diagnosis Code:280.9 Anemia      [name] CMA (AAMA)

## 2010-07-21 NOTE — Progress Notes (Signed)
  Phone Note Refill Request Message from:  Patient on May 03, 2010 3:41 PM  Refills Requested: Medication #1:  TRAMADOL HCL 50 MG TABS Take 1-2 tablet by mouth four times a day please advise refill  Initial call taken by: Ami Bullins CMA,  May 03, 2010 3:42 PM  Follow-up for Phone Call        ok to refill x 3 Follow-up by: Jacques Navy MD,  May 04, 2010 9:03 AM    Prescriptions: TRAMADOL HCL 50 MG TABS (TRAMADOL HCL) Take 1-2 tablet by mouth four times a day  #240 x 3   Entered by:   Ami Bullins CMA   Authorized by:   Jacques Navy MD   Signed by:   Bill Salinas CMA on 05/04/2010   Method used:   Electronically to        [facility] (retail)       [address]       [place], [place]  [address]       Ph: [phone]       Fax: [phone]   RxID:   [id number]

## 2010-07-21 NOTE — Assessment & Plan Note (Signed)
Summary: hosp f/u   Vital Signs:  Patient profile:   77 year old female Height:      62 inches Weight:      165.75 pounds BMI:     30.43 O2 Sat:      97 % on Room air Temp:     97.6 degrees F oral Pulse rate:   88 / minute BP sitting:   160 / 98  (left arm) Cuff size:   regular  Vitals Entered By: Lucious Groves (Nov 05, 2009 8:52 AM)  O2 Flow:  Room air CC: Hosp. f/u-- c/o temperature changes and weak voice./kb Is Patient Diabetic? Yes Pain Assessment Patient in pain? no        Primary Care Provider:  Keontay Vora  CC:  Hosp. f/u-- c/o temperature changes and weak voice./kb.  History of Present Illness: Patient is here for follow-up from her hospitalization (5/14-5/16) for UTI and AMS.  Her hospital summary was reviewed.  She reports continued weakness since her discharge 4 days ago.  She says she is not doing much around the house and gets short of breath easily with activity.  She states that her voice feels weak.  She also describes night sweats- waking up at night with her clothes and sheets drenched followed by chills.  She reports a fair appetite with no nausea or vomiting since her return home.  She denies any mental status changes.  She denies urinary symptoms, cough, and chest pain.  She reports baseline difficulty with swallowing solid foods like beef or pork.  She denies being aware of blood in her urine or stool.  She reports regular use of the antibiotic she was discharged on- her antibiotic course will end tomorrow.    Current Medications (verified): 1)  Enalapril Maleate 20 Mg Tabs (Enalapril Maleate) .... Take 1 Tablet By Mouth Once A Day 2)  Glucotrol 5 Mg  Tabs (Glipizide) .... Take One Tablet Once Daily 3)  Tramadol Hcl 50 Mg Tabs (Tramadol Hcl) .... Take 1-2 Tablet By Mouth Four Times A Day 4)  Mobic 15 Mg  Tabs (Meloxicam) .Marland Kitchen.. 1 Once Daily 5)  Lasix 20 Mg  Tabs (Furosemide) .... Take One Tablet Once Daily 6)  Caltrate 600+d Plus 600-400 Mg-Unit  Tabs (Calcium  Carbonate-Vit D-Min) .... Take One Tablet Twice Daily 7)  Indomethacin 25 Mg  Caps (Indomethacin) .... Take 1 Tab Q6 As Needed 8)  Belladonna Alk-Phenobarbital 16.2 Mg  Tabs (Belladonna Alk-Phenobarbital) .Marland Kitchen.. 1 By Mouth As Needed Q12 9)  Metformin Hcl 500 Mg  Tabs (Metformin Hcl) .Marland Kitchen.. 1 By Mouth Two Times A Day 10)  Benzonatate 100 Mg  Caps (Benzonatate) .Marland Kitchen.. 1 Three Times A Day For Cough As Needed. 11)  Omeprazole 20 Mg  Tbec (Omeprazole) .... Take 1 Tablet By Mouth Every Morning As Needed 12)  Diclofenac Sodium 75 Mg  Tbec (Diclofenac Sodium) .... Take 1 Tablet By Mouth Once A Day 13)  Cyclobenzaprine Hcl 5 Mg Tabs (Cyclobenzaprine Hcl) .Marland Kitchen.. 1 By Mouth Three Times A Day For Back Pain.  Allergies (verified): 1)  ! Codeine 2)  ! 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 I-ADLs Discussed end of life care: would not want resuscitation or being maintained with mechanical ventilation.  Review of Systems       The patient complains of dyspnea on exertion and peripheral edema.  The patient denies anorexia, fever, weight loss, weight gain, chest pain, prolonged  cough, headaches, abdominal pain, melena, hematochezia, and hematuria.         Peripheral edema is at patient's baseline.    Physical Exam  General:  Alert but tired appearing elderly woman in no apparent distress.   Head:  normocephalic and atraumatic.   Eyes:  vision grossly intact, pupils equal, pupils round, pupils reactive to light, and no injection.   Ears:  no external deformities.   Nose:  no external deformity.   Neck:  supple and no masses.   Lungs:  normal respiratory effort, no accessory muscle use, no crackles, and no wheezes.  slightly decreased air movement across all lung fields.   Heart:  normal rate, regular rhythm, no murmur, no gallop, and no rub.   Abdomen:  soft, non-tender, normal bowel sounds, no distention, no masses, no guarding, and no rigidity. no CVA tenderness.  Msk:  normal ROM, no joint tenderness, no joint swelling, and no joint warmth.   Pulses:  R radial normal.   Extremities:  trace left pedal edema and trace right pedal edema.   Neurologic:  alert & oriented X3, cranial nerves II-XII intact, strength normal in all extremities, and gait normal.   Skin:  slightly pale  Cervical Nodes:  no anterior cervical adenopathy and no posterior cervical adenopathy.   Additional Exam:  O2 saturation at rest: 96% with pulse 88 O2 saturation walking:  94% with pulse 103   Impression & Recommendations:  Problem # 1:  NIGHT SWEATS (ICD-780.8) Patient reports profuse sweating at night with chills starting when she was in the hospital.  Some concern for nighttime hypoglycemia (patient is currently on Metformin and Glipizide for diabetes control).  Patient checks sugars intermittently at home and has never gotten a low value.  Also concerned for continued infection.  Plan- Check A1c today.  If within goal range will consider discontinuing glipizide to lower risk of hypoglycemia.          Check CBC, U/A today for evidence of continued infection.  Orders: TLB-CBC  Platelet - w/Differential (85025-CBCD)  Addendum- U/A negative.    Problem # 2:  ANEMIA, IRON DEFICIENCY (ICD-280.9) Patient was found to be mildly anemic with iron deficiency during her hospitalization.  B12 and folate were normal.  Patient reports a long history of anemia and says that in the past she received B injections.  She has a history of GERD and esophageal stricture that has been dilated several times.  Patient's last colonoscopy was in 2007.  Plan- Will refer patient back to her former GI provider for consideration of benefits of either upper or lower endoscopy.     Orders: Gastroenterology Referral (GI)  Problem # 3:  DIABETES MELLITUS, TYPE II (ICD-250.00) Patient reports good CBGs at home, but still concerned about possibility of hypoglycemic episode at night.   Her updated medication list for this problem includes:  Enalapril Maleate 20 Mg Tabs (Enalapril maleate) .Marland Kitchen... Take 1 tablet by mouth once a day    Glucotrol 5 Mg Tabs (Glipizide) .Marland Kitchen... Take one tablet once daily    Metformin Hcl 500 Mg Tabs (Metformin hcl) .Marland Kitchen... 1 by mouth two times a day  Orders: TLB-A1C / Hgb A1C (Glycohemoglobin) (83036-A1C) TLB-BMP (Basic Metabolic Panel-BMET) (80048-METABOL)  Addendum- A1C 7.8%  Plan will call and have her stopp glucotrol. Will consider adding actos or [place].  Complete Medication List: 1)  Enalapril Maleate 20 Mg Tabs (Enalapril maleate) .... Take 1 tablet by mouth once a day 2)  Glucotrol 5 Mg Tabs (Glipizide) .... Take one tablet once daily 3)  Tramadol Hcl 50 Mg Tabs (Tramadol hcl) .... Take 1-2 tablet by mouth four times a day 4)  Mobic 15 Mg Tabs (Meloxicam) .Marland Kitchen.. 1 once daily 5)  Lasix 20 Mg Tabs (Furosemide) .... Take one tablet once daily 6)  Caltrate 600+d Plus 600-400 Mg-unit Tabs (Calcium carbonate-vit d-min) .... Take one tablet twice daily 7)  Indomethacin 25 Mg Caps (Indomethacin) .... Take 1 tab q6 as needed 8)  Belladonna Alk-phenobarbital 16.2 Mg  Tabs (Belladonna alk-phenobarbital) .Marland Kitchen.. 1 by mouth as needed q12 9)  Metformin Hcl 500 Mg Tabs (Metformin hcl) .Marland Kitchen.. 1 by mouth two times a day 10)  Benzonatate 100 Mg Caps (Benzonatate) .Marland Kitchen.. 1 three times a day for cough as needed. 11)  Omeprazole 20 Mg Tbec (Omeprazole) .... Take 1 tablet by mouth every morning as needed 12)  Diclofenac Sodium 75 Mg Tbec (Diclofenac sodium) .... Take 1 tablet by mouth once a day 13)  Cyclobenzaprine Hcl 5 Mg Tabs (Cyclobenzaprine hcl) .Marland Kitchen.. 1 by mouth three times a day for back pain.  Laboratory Results   Urine Tests    Routine Urinalysis   Color: lt. yellow Appearance: Clear Glucose: negative   (Normal Range: Negative) Bilirubin: negative   (Normal Range: Negative) Ketone: negative   (Normal Range: Negative) Spec. Gravity: <1.005   (Normal Range: 1.003-1.035) Blood: negative   (Normal Range: Negative) pH: 5.0   (Normal Range: 5.0-8.0) Protein: negative   (Normal Range: Negative) Urobilinogen: 0.2   (Normal Range: 0-1) Nitrite: negative   (Normal Range: Negative) Leukocyte Esterace: negative   (Normal Range: Negative)

## 2010-07-21 NOTE — Letter (Signed)
Summary: Moviprep Instructions  [facility]  [address]   [place], [place] [address]   Phone: [phone]  Fax: [phone]       Andrea Wilkinson    [birth year]    MRN: [id number]        Procedure Day Dorna Bloom: Monday, 01-24-10     Arrival Time: 10:00 a.m.     Procedure Time: 11:00 a.m.     Location of Procedure:                    x   [facility] (4th Floor)   PREPARATION FOR COLONOSCOPY WITH MOVIPREP   Starting 5 days prior to your procedure 01-19-10  do not eat nuts, seeds, popcorn, corn, beans, peas,  salads, or any raw vegetables.  Do not take any fiber supplements (e.g. Metamucil, Citrucel, and Benefiber).  THE DAY BEFORE YOUR PROCEDURE         DATE: 01-23-10   DAY: Sunday  1.  Drink clear liquids the entire day-NO SOLID FOOD  2.  Do not drink anything colored red or purple.  Avoid juices with pulp.  No orange juice.  3.  Drink at least 64 oz. (8 glasses) of fluid/clear liquids during the day to prevent dehydration and help the prep work efficiently.  CLEAR LIQUIDS INCLUDE: Water Jello Ice Popsicles Tea (sugar ok, no milk/cream) Powdered fruit flavored drinks Coffee (sugar ok, no milk/cream) Gatorade Juice: apple, white grape, white cranberry  Lemonade Clear bullion, consomm, broth Carbonated beverages (any kind) Strained chicken noodle soup Hard Candy                             4.  In the morning, mix first dose of MoviPrep solution:    Empty 1 Pouch A and 1 Pouch B into the disposable container    Add lukewarm drinking water to the top line of the container. Mix to dissolve    Refrigerate (mixed solution should be used within 24 hrs)  5.  Begin drinking the prep at 5:00 p.m. The MoviPrep container is divided by 4 marks.   Every 15 minutes drink the solution down to the next mark (approximately 8 oz) until the full liter is complete.   6.  Follow completed prep with 16 oz of clear liquid of your choice (Nothing red or purple).   Continue to drink clear liquids until bedtime.  7.  Before going to bed, mix second dose of MoviPrep solution:    Empty 1 Pouch A and 1 Pouch B into the disposable container    Add lukewarm drinking water to the top line of the container. Mix to dissolve    Refrigerate  THE DAY OF YOUR PROCEDURE      DATE: 01-24-10  DAY: Monday  Beginning at 6:00 a.m. (5 hours before procedure):         1. Every 15 minutes, drink the solution down to the next mark (approx 8 oz) until the full liter is complete.  2. Follow completed prep with 16 oz. of clear liquid of your choice.    3. You may drink clear liquids until  9:00 a.m.  (2 HOURS BEFORE PROCEDURE).   MEDICATION INSTRUCTIONS  Unless otherwise instructed, you should take regular prescription medications with a small sip of water   as early as possible the morning of your procedure.  Diabetic patients - see separate instructions.    Additional medication instructions: Do  not take Lasix day of procedure.         OTHER INSTRUCTIONS  You will need a responsible adult at least 75 years of age to accompany you and drive you home.   This person must remain in the waiting room during your procedure.  Wear loose fitting clothing that is easily removed.  Leave jewelry and other valuables at home.  However, you may wish to bring a book to read or  an iPod/MP3 player to listen to music as you wait for your procedure to start.  Remove all body piercing jewelry and leave at home.  Total time from sign-in until discharge is approximately 2-3 hours.  You should go home directly after your procedure and rest.  You can resume normal activities the  day after your procedure.  The day of your procedure you should not:   Drive   Make legal decisions   Operate machinery   Drink alcohol   Return to work  You will receive specific instructions about eating, activities and medications before you leave.    The above instructions have  been reviewed and explained to me by   Ezra Sites RN  January 10, 2010 2:08 PM     I fully understand and can verbalize these instructions _____________________________ Date _________

## 2010-07-21 NOTE — Letter (Signed)
Summary: Diabetic Instructions  [facility]  [address] [place], [place] [address]   Phone: [phone]  Fax: [phone]    Diamond Nichol 1931/08/25 MRN: [id number]   X    ORAL DIABETIC MEDICATION INSTRUCTIONS                                   GLIPIZIDE/METFORMIN The day before your procedure:   Take your diabetic pill as you do normally  The day of your procedure:   Do not take your diabetic pill    We will check your blood sugar levels during the admission process and again in Recovery before discharging you home  ________________________________________________________________________  _  _   INSULIN (LONG ACTING) MEDICATION INSTRUCTIONS (Lantus, NPH, 70/30, Humulin, Novolin-N)   The day before your procedure:   Take  your regular evening dose    The day of your procedure:   Do not take your morning dose    _  _   INSULIN (SHORT ACTING) MEDICATION INSTRUCTIONS (Regular, Humulog, Novolog)   The day before your procedure:   Do not take your evening dose   The day of your procedure:   Do not take your morning dose   _  _   INSULIN PUMP MEDICATION INSTRUCTIONS  We will contact the physician managing your diabetic care for written dosage instructions for the day before your procedure and the day of your procedure.  Once we have received the instructions, we will contact you.

## 2010-07-21 NOTE — Progress Notes (Signed)
Summary: Rx at [facility]  Phone Note From Pharmacy   Summary of Call: Pharm called, pt was expecting to get rx for cyclobenzaprine at local pharm. Ok to send to [facility]?  Initial call taken by: Lamar Sprinkles, CMA,  August 03, 2009 9:50 AM  Follow-up for Phone Call        cyclobenzaprine 5mg   1 by mouth q6 as needed back spasm  #30 refill x 2 Follow-up by: Jacques Navy MD,  August 03, 2009 10:54 AM    Prescriptions: CYCLOBENZAPRINE HCL 5 MG TABS (CYCLOBENZAPRINE HCL) 1 by mouth three times a day for back pain.  #30 x 2   Entered by:   Lamar Sprinkles, CMA   Authorized by:   Jacques Navy MD   Signed by:   Lamar Sprinkles, CMA on 08/03/2009   Method used:   Electronically to        [facility] (retail)       [address]       [place], [place]  [address]       Ph: [phone]       Fax: [phone]   RxID:   [id number]

## 2010-08-09 ENCOUNTER — Encounter: Payer: Self-pay | Admitting: Internal Medicine

## 2010-08-15 ENCOUNTER — Telehealth: Payer: Self-pay | Admitting: Internal Medicine

## 2010-08-16 NOTE — Medication Information (Signed)
Summary: Orders / [facility]  Orders / [facility]   Imported By: Lennie Odor 08/10/2010 10:58:37  _____________________________________________________________________  External Attachment:    Type:   Image     Comment:   External Document

## 2010-08-17 ENCOUNTER — Telehealth: Payer: Self-pay | Admitting: Internal Medicine

## 2010-08-22 ENCOUNTER — Ambulatory Visit: Payer: Medicare PPO | Attending: Orthopedic Surgery | Admitting: Physical Therapy

## 2010-08-22 DIAGNOSIS — M2550 Pain in unspecified joint: Secondary | ICD-10-CM | POA: Insufficient documentation

## 2010-08-22 DIAGNOSIS — IMO0001 Reserved for inherently not codable concepts without codable children: Secondary | ICD-10-CM | POA: Insufficient documentation

## 2010-08-22 DIAGNOSIS — R262 Difficulty in walking, not elsewhere classified: Secondary | ICD-10-CM | POA: Insufficient documentation

## 2010-08-22 DIAGNOSIS — M2560 Stiffness of unspecified joint, not elsewhere classified: Secondary | ICD-10-CM | POA: Insufficient documentation

## 2010-08-25 ENCOUNTER — Ambulatory Visit: Payer: Medicare PPO | Admitting: Physical Therapy

## 2010-08-25 NOTE — Progress Notes (Signed)
Summary: refill request  Phone Note Refill Request Message from:  Fax from Pharmacy  Refills Requested: Medication #1:  LASIX 20 MG  TABS Take one tablet once daily   Supply Requested: 3 months  Medication #2:  OMEPRAZOLE 20 MG  TBEC Take 1 tablet by mouth every morning as needed   Supply Requested: 3 months  Medication #3:  MOBIC 15 MG  TABS 1 once daily   Supply Requested: 3 months  Medication #4:  GLUCOTROL 5 MG  TABS Take one tablet once daily   Supply Requested: 3 months Also req Metformin, Indomethacin, Enalapril [facility]   Method Requested: Electronic Initial call taken by: Burnard Leigh [facility]),  August 17, 2010 4:29 PM    Prescriptions: OMEPRAZOLE 20 MG  TBEC (OMEPRAZOLE) Take 1 tablet by mouth every morning as needed  #90 x 3   Entered by:   Vertis Kelch)   Authorized by:   Jacques Navy MD   Signed by:   Burnard Leigh CMA(AAMA) on 08/17/2010   Method used:   Electronically to        [facility] (retail)       [address] [place], [place]  [address]       Ph: [phone]       Fax: [phone]   RxID:   [id number] METFORMIN HCL 1000 MG TABS (METFORMIN HCL) 1 tablet two times a day  #180 x 2   Entered by:   Vertis Kelch)   Authorized by:   Jacques Navy MD   Signed by:   Burnard Leigh CMA(AAMA) on 08/17/2010   Method used:   Electronically to        [facility] (retail)       [address]       [place], [place]  [address]       Ph: [phone]       Fax: [phone]   RxID:   [id number] INDOMETHACIN 25 MG  CAPS (INDOMETHACIN) Take 1 tab q6 as needed  #30 Each x 2   Entered by:   Vertis Kelch)   Authorized by:   Jacques Navy MD   Signed by:   Burnard Leigh CMA(AAMA) on 08/17/2010   Method used:   Electronically to        [facility] (retail)       [address] [place], [place]  [address]       Ph: [phone]       Fax: [phone]   RxID:   [id number] LASIX 20 MG   TABS (FUROSEMIDE) Take one tablet once daily  #90 x 3   Entered by:   Vertis Kelch)   Authorized by:   Jacques Navy MD   Signed by:   Burnard Leigh CMA(AAMA) on 08/17/2010   Method used:   Electronically to        [facility] (retail)       [address] [place], [place]  [address]       Ph: [phone]       Fax: [phone]   RxID:   [id number] MOBIC 15 MG  TABS (MELOXICAM) 1 once daily  #90 x 3   Entered by:   Vertis Kelch)   Authorized by:   Jacques Navy MD   Signed by:   Burnard Leigh CMA(AAMA) on 08/17/2010  Method used:   Electronically to        [facility] (retail)       [address] [place], [place]  [address]       Ph: [phone]       Fax: [phone]   RxID:   [id number] GLUCOTROL 5 MG  TABS (GLIPIZIDE) Take one tablet once daily  #90 x 3   Entered by:   Burnard Leigh [facility])   Authorized by:   Jacques Navy MD   Signed by:   Burnard Leigh CMA(AAMA) on 08/17/2010   Method used:   Electronically to        [facility] ([facility]ail)       [address] [place], [place]  [address]       Ph: [phone]       Fax: [phone]   RxID:   [id number] ENALAPRIL MALEATE 20 MG TABS (ENALAPRIL MALEATE) Take 1 tablet by mouth once a day  #90 x 3   Entered by:   Vertis Kelch)   Authorized by:   Jacques Navy MD   Signed by:   Burnard Leigh CMA(AAMA) on 08/17/2010   Method used:   Electronically to        [facility] (retail)       [address]       [place], [place]  [address]       Ph: [phone]       Fax: [phone]   RxID:   [id number]

## 2010-08-25 NOTE — Progress Notes (Signed)
Summary: refill faxed  Phone Note Refill Request Message from:  Pharmacy  Refills Requested: Medication #1:  BELLADONNA ALK-PHENOBARBITAL 16.2 MG  TABS 1 by mouth as needed q12   Supply Requested: 60   Notes: Sig: 1 tablet Q12 hrs as needed Refills: 2 [facility].Faxed [phone] w/signed authorization Dr. Debby Bud  Initial call taken by: Burnard Leigh [facility]),  August 17, 2010 4:49 PM    Prescriptions: BELLADONNA ALK-PHENOBARBITAL 16.2 MG  TABS (BELLADONNA ALK-PHENOBARBITAL) 1 by mouth as needed q12  #60 x 3   Entered by:   Burnard Leigh [facility])   Authorized by:   Jacques Navy MD   Signed by:   Burnard Leigh CMA(AAMA) on 08/17/2010   Method used:   Historical   RxID:   [id number]

## 2010-08-25 NOTE — Progress Notes (Signed)
Summary: refill request  Phone Note From Pharmacy   Caller: [facility] Call For: Tramadol 50mg   Reason for Call: Needs renewal Summary of Call: Pharmacy requesting authorization for Rx refill Tramadol 50mg  Take 1-2 tabs qid #240  Initial call taken by: Burnard Leigh [facility]),  August 17, 2010 4:19 PM  Follow-up for Phone Call        ok for refill x 3 Follow-up by: Jacques Navy MD,  August 17, 2010 5:26 PM    Prescriptions: TRAMADOL HCL 50 MG TABS (TRAMADOL HCL) Take 1-2 tablet by mouth four times a day  #240 x 3   Entered by:   Lamar Sprinkles, CMA   Authorized by:   Jacques Navy MD   Signed by:   Lamar Sprinkles, CMA on 08/17/2010   Method used:   Electronically to        [facility] (retail)       [address] [place], [place]  [address]       Ph: [phone]       Fax: [phone]   RxID:   [id number]

## 2010-08-25 NOTE — Progress Notes (Signed)
Phone Note Refill Request   Initial call taken by: Ami Bullins CMA,  August 15, 2010 3:12 PM    Prescriptions: OMEPRAZOLE 20 MG  TBEC (OMEPRAZOLE) Take 1 tablet by mouth every morning as needed  #90 x 3   Entered by:   Ami Bullins CMA   Authorized by:   Jacques Navy MD   Signed by:   Bill Salinas CMA on 08/15/2010   Method used:   Electronically to        [facility] ([facility]ail)       [address] [place], [place]  [address]       Ph: [phone]       Fax: [phone]   RxID:   [id number] METFORMIN HCL 1000 MG TABS (METFORMIN HCL) 1 tablet two times a day  #120 x 2   Entered by:   Bill Salinas CMA   Authorized by:   Jacques Navy MD   Signed by:   Bill Salinas CMA on 08/15/2010   Method used:   Electronically to        [facility] ([facility]ail)       [address] [place], [place]  [address]       Ph: [phone]       Fax: [phone]   RxID:   [id number] INDOMETHACIN 25 MG  CAPS (INDOMETHACIN) Take 1 tab q6 as needed  #30 Each x 2   Entered by:   Bill Salinas CMA   Authorized by:   Jacques Navy MD   Signed by:   Bill Salinas CMA on 08/15/2010   Method used:   Electronically to        [facility] ([facility])       [address] [place], [place]  [address]       Ph: [phone]       Fax: [phone]   RxID:   [id number] LASIX 20 MG  TABS (FUROSEMIDE) Take one tablet once daily  #90 x 3   Entered by:   Ami Bullins CMA   Authorized by:   Jacques Navy MD   Signed by:   Bill Salinas CMA on 08/15/2010   Method used:   Electronically to        [facility] ([facility]ail)       [address] [place], [place]  [address]       Ph: [phone]       Fax: [phone]   RxID:   [id number] TRAMADOL HCL 50 MG TABS (TRAMADOL HCL) Take 1-2 tablet by mouth four times a day  #240 x 3   Entered by:   Ami Bullins CMA   Authorized by:   Jacques Navy MD   Signed by:   Bill Salinas CMA on 08/15/2010   Method used:   Electronically to   [facility] ([facility]ail)       [address] [place], [place]  [address]       Ph: [phone]       Fax: [phone]   RxID:   [id number] GLUCOTROL 5 MG  TABS (GLIPIZIDE) Take one tablet once daily  #90 x 3   Entered by:   Ami Bullins CMA   Authorized by:   Jacques Navy MD   Signed by:   Bill Salinas CMA on  08/15/2010   Method used:   Electronically to        [facility] ([facility])       [address] [place], [place]  [address]       Ph: [phone]       Fax: [phone]   RxID:   [id number] ENALAPRIL MALEATE 20 MG TABS (ENALAPRIL MALEATE) Take 1 tablet by mouth once a day  #90 x 3   Entered by:   Ami Bullins CMA   Authorized by:   Jacques Navy MD   Signed by:   Bill Salinas CMA on 08/15/2010   Method used:   Electronically to        [facility] ([facility])       [address] [place], [place]  [address]       Ph: [phone]       Fax: [phone]   RxID:   [id number]

## 2010-08-29 ENCOUNTER — Ambulatory Visit: Payer: Medicare PPO | Admitting: Physical Therapy

## 2010-08-29 LAB — URINALYSIS, ROUTINE W REFLEX MICROSCOPIC
Glucose, UA: NEGATIVE mg/dL
Protein, ur: NEGATIVE mg/dL
Urobilinogen, UA: 0.2 mg/dL (ref 0.0–1.0)
pH: 6.5 (ref 5.0–8.0)

## 2010-08-29 LAB — BASIC METABOLIC PANEL
CO2: 28 mEq/L (ref 19–32)
Calcium: 10.1 mg/dL (ref 8.4–10.5)
Creatinine, Ser: 0.83 mg/dL (ref 0.4–1.2)
GFR calc Af Amer: >60 mL/min (ref >60)
Glucose, Bld: 182 mg/dL — ABNORMAL HIGH (ref 70–99)
Potassium: 4.2 mEq/L (ref 3.5–5.1)

## 2010-08-29 LAB — CBC
Hemoglobin: 13.9 g/dL (ref 12.0–15.0)
MCH: 31.3 pg (ref 26.0–34.0)
RDW: 12.5 % (ref 11.5–15.5)

## 2010-08-29 LAB — DIFFERENTIAL
Basophils Absolute: 0 10*3/uL (ref 0.0–0.1)
Eosinophils Relative: 2 % (ref 0–5)
Lymphocytes Relative: 36 % (ref 12–46)
Lymphs Abs: 2.3 10*3/uL (ref 0.7–4.0)
Monocytes Absolute: 0.5 10*3/uL (ref 0.1–1.0)
Monocytes Relative: 8 % (ref 3–12)

## 2010-08-29 LAB — URINE MICROSCOPIC-ADD ON

## 2010-08-29 LAB — APTT: aPTT: 31 seconds (ref 24–37)

## 2010-08-31 ENCOUNTER — Ambulatory Visit: Payer: Medicare PPO | Admitting: Physical Therapy

## 2010-09-02 LAB — GLUCOSE, CAPILLARY
Glucose-Capillary: 181 mg/dL — ABNORMAL HIGH (ref 70–99)
Glucose-Capillary: 200 mg/dL — ABNORMAL HIGH (ref 70–99)

## 2010-09-04 LAB — GLUCOSE, CAPILLARY: Glucose-Capillary: 195 mg/dL — ABNORMAL HIGH (ref 70–99)

## 2010-09-05 ENCOUNTER — Ambulatory Visit: Payer: Medicare PPO | Admitting: Physical Therapy

## 2010-09-05 LAB — RETICULOCYTES
Retic Count, Absolute: 30.7 10*3/uL (ref 19.0–186.0)
Retic Ct Pct: 0.8 % (ref 0.4–3.1)

## 2010-09-05 LAB — CULTURE, BLOOD (ROUTINE X 2)
Culture: NO GROWTH
Culture: NO GROWTH

## 2010-09-05 LAB — COMPREHENSIVE METABOLIC PANEL
ALT: 17 U/L (ref 0–35)
AST: 18 U/L (ref 0–37)
Albumin: 3.1 g/dL — ABNORMAL LOW (ref 3.5–5.2)
CO2: 26 mEq/L (ref 19–32)
Chloride: 103 mEq/L (ref 96–112)
Creatinine, Ser: 1.06 mg/dL (ref 0.4–1.2)
GFR calc Af Amer: >60 mL/min (ref >60)
GFR calc non Af Amer: 50 mL/min — ABNORMAL LOW (ref >60)
Potassium: 3.2 mEq/L — ABNORMAL LOW (ref 3.5–5.1)
Sodium: 137 mEq/L (ref 135–145)
Total Bilirubin: 0.5 mg/dL (ref 0.3–1.2)

## 2010-09-05 LAB — MAGNESIUM: Magnesium: 2.1 mg/dL (ref 1.5–2.5)

## 2010-09-05 LAB — GLUCOSE, CAPILLARY
Glucose-Capillary: 116 mg/dL — ABNORMAL HIGH (ref 70–99)
Glucose-Capillary: 157 mg/dL — ABNORMAL HIGH (ref 70–99)
Glucose-Capillary: 188 mg/dL — ABNORMAL HIGH (ref 70–99)

## 2010-09-05 LAB — FOLATE: Folate: >20 ng/mL

## 2010-09-05 LAB — VITAMIN B12: Vitamin B-12: 289 pg/mL (ref 211–911)

## 2010-09-05 LAB — IRON AND TIBC: Saturation Ratios: 6 % — ABNORMAL LOW (ref 20–55)

## 2010-09-05 LAB — CBC
MCV: 90.2 fL (ref 78.0–100.0)
Platelets: 227 10*3/uL (ref 150–400)
RBC: 3.72 MIL/uL — ABNORMAL LOW (ref 3.87–5.11)
WBC: 12.3 10*3/uL — ABNORMAL HIGH (ref 4.0–10.5)

## 2010-09-06 LAB — DIFFERENTIAL
Eosinophils Relative: 1 % (ref 0–5)
Lymphocytes Relative: 19 % (ref 12–46)
Lymphs Abs: 3 10*3/uL (ref 0.7–4.0)
Monocytes Absolute: 1.4 10*3/uL — ABNORMAL HIGH (ref 0.1–1.0)
Neutro Abs: 11 10*3/uL — ABNORMAL HIGH (ref 1.7–7.7)

## 2010-09-06 LAB — URINALYSIS, ROUTINE W REFLEX MICROSCOPIC
Hgb urine dipstick: NEGATIVE
Specific Gravity, Urine: 1.027 (ref 1.005–1.030)
pH: 5.5 (ref 5.0–8.0)

## 2010-09-06 LAB — BASIC METABOLIC PANEL
Calcium: 8.8 mg/dL (ref 8.4–10.5)
Chloride: 97 mEq/L (ref 96–112)
GFR calc non Af Amer: 47 mL/min — ABNORMAL LOW (ref >60)

## 2010-09-06 LAB — URINE CULTURE

## 2010-09-06 LAB — GLUCOSE, CAPILLARY

## 2010-09-06 LAB — URINE MICROSCOPIC-ADD ON

## 2010-09-06 LAB — POCT CARDIAC MARKERS
CKMB, poc: 1.6 ng/mL (ref 1.0–8.0)
Troponin i, poc: <0.05 ng/mL (ref 0.00–0.09)

## 2010-09-06 LAB — CBC
HCT: 33.2 % — ABNORMAL LOW (ref 36.0–46.0)
Hemoglobin: 11.6 g/dL — ABNORMAL LOW (ref 12.0–15.0)
RBC: 3.71 MIL/uL — ABNORMAL LOW (ref 3.87–5.11)
WBC: 15.5 10*3/uL — ABNORMAL HIGH (ref 4.0–10.5)

## 2010-09-07 ENCOUNTER — Ambulatory Visit: Payer: Medicare PPO | Admitting: Physical Therapy

## 2010-09-14 ENCOUNTER — Ambulatory Visit: Payer: Medicare PPO | Admitting: Physical Therapy

## 2010-09-16 ENCOUNTER — Ambulatory Visit: Payer: Medicare PPO | Admitting: Physical Therapy

## 2010-09-19 ENCOUNTER — Ambulatory Visit: Payer: Medicare HMO | Attending: Orthopedic Surgery | Admitting: Physical Therapy

## 2010-09-19 DIAGNOSIS — M2560 Stiffness of unspecified joint, not elsewhere classified: Secondary | ICD-10-CM | POA: Insufficient documentation

## 2010-09-19 DIAGNOSIS — IMO0001 Reserved for inherently not codable concepts without codable children: Secondary | ICD-10-CM | POA: Insufficient documentation

## 2010-09-19 DIAGNOSIS — M2550 Pain in unspecified joint: Secondary | ICD-10-CM | POA: Insufficient documentation

## 2010-09-19 DIAGNOSIS — R262 Difficulty in walking, not elsewhere classified: Secondary | ICD-10-CM | POA: Insufficient documentation

## 2010-09-22 ENCOUNTER — Ambulatory Visit: Payer: Medicare HMO | Admitting: Physical Therapy

## 2010-10-03 ENCOUNTER — Ambulatory Visit: Payer: Medicare HMO | Admitting: Physical Therapy

## 2010-10-05 ENCOUNTER — Encounter: Payer: Medicare Other | Admitting: Physical Therapy

## 2010-10-31 ENCOUNTER — Other Ambulatory Visit: Payer: Self-pay | Admitting: Orthopedic Surgery

## 2010-10-31 ENCOUNTER — Encounter (HOSPITAL_COMMUNITY): Payer: Medicare PPO

## 2010-10-31 LAB — BASIC METABOLIC PANEL WITH GFR
BUN: 12 mg/dL (ref 6–23)
CO2: 27 meq/L (ref 19–32)
Calcium: 9.7 mg/dL (ref 8.4–10.5)
Chloride: 102 meq/L (ref 96–112)
Creatinine, Ser: 0.71 mg/dL (ref 0.4–1.2)
GFR calc non Af Amer: >60 mL/min
Glucose, Bld: 189 mg/dL — ABNORMAL HIGH (ref 70–99)
Potassium: 4 meq/L (ref 3.5–5.1)
Sodium: 140 meq/L (ref 135–145)

## 2010-10-31 LAB — PROTIME-INR
INR: 1 (ref 0.00–1.49)
Prothrombin Time: 13.4 s (ref 11.6–15.2)

## 2010-10-31 LAB — CBC
HCT: 40.4 % (ref 36.0–46.0)
Hemoglobin: 12.7 g/dL (ref 12.0–15.0)
MCHC: 31.4 g/dL (ref 30.0–36.0)
RDW: 13 % (ref 11.5–15.5)
WBC: 6.7 10*3/uL (ref 4.0–10.5)

## 2010-10-31 LAB — DIFFERENTIAL
Basophils Absolute: 0 K/uL (ref 0.0–0.1)
Basophils Relative: 0 % (ref 0–1)
Eosinophils Absolute: 0.1 K/uL (ref 0.0–0.7)
Eosinophils Relative: 2 % (ref 0–5)
Lymphocytes Relative: 40 % (ref 12–46)
Lymphs Abs: 2.6 K/uL (ref 0.7–4.0)
Monocytes Absolute: 0.3 K/uL (ref 0.1–1.0)
Monocytes Relative: 5 % (ref 3–12)
Neutro Abs: 3.5 K/uL (ref 1.7–7.7)
Neutrophils Relative %: 53 % (ref 43–77)

## 2010-10-31 LAB — URINALYSIS, ROUTINE W REFLEX MICROSCOPIC
Bilirubin Urine: NEGATIVE
Nitrite: NEGATIVE
Specific Gravity, Urine: 1.025 (ref 1.005–1.030)
Urobilinogen, UA: 0.2 mg/dL (ref 0.0–1.0)
pH: 5.5 (ref 5.0–8.0)

## 2010-10-31 LAB — SURGICAL PCR SCREEN: Staphylococcus aureus: NEGATIVE

## 2010-10-31 LAB — APTT: aPTT: 31 s (ref 24–37)

## 2010-11-04 NOTE — Discharge Summary (Signed)
NAME:  Andrea Wilkinson, Andrea Wilkinson                           ACCOUNT NO.:  [id number]   MEDICAL RECORD NO.:  [id number]                   PATIENT TYPE:  INP   LOCATION:  9303                                 FACILITY:  [facility]   PHYSICIAN:  Carrington Clamp, M.D.              DATE OF BIRTH:  December 17, 1931   DATE OF ADMISSION:  05/22/2003  DATE OF DISCHARGE:  05/24/2003                                 DISCHARGE SUMMARY   ADMITTING DIAGNOSES:  History of postmenopausal bleeding and uterine  prolapse symptoms.   DISCHARGE DIAGNOSES:  History of postmenopausal bleeding and uterine  prolapse symptoms.   PROCEDURE PERFORMED:  1. Total vaginal hysterectomy with bilateral salpingo-oophorectomy.  2. Anterior repair.   PERTINENT TEST RESULTS:  Postoperative H&H of 12 and 31.   HISTORY AND PHYSICAL:  Please refer to dictated history and physical on  chart but briefly this is a 77 year old G5, P5-0-0-5 who had history of  postmenopausal bleeding in the recent past, but now is having significant  uterine prolapse symptoms.  The patient was admitted on May 22, 2003 for  the above named procedures and underwent them without complication.  However, postoperative day #2 patient was noted to have some overflow  incontinence symptoms and the patient had a catheter placed inside her  bladder which showed 1000 mL of urine retained.  The Foley was replaced but  the patient was eating and ambulating at that point without any problems.  The patient was discharged on postoperative day #2 with the following.   DISCHARGE MEDICATIONS:  1. Percocet 5 mg one p.o. q.4-6h. p.r.n. pain.  2. Ceftin 250 mg one p.o. b.i.d. x7 days.   ACTIVITY:  No heavy lifting or straining x6 weeks.  Pelvic rest x6 weeks.   DIET:  High fiber and high water.   FOLLOWUP:  The following week for voiding trial.   ADDENDUM:  The patient presented for a voiding trial one week later and  failed it.  The Foley catheter was replaced at that time and  patient had  returned on a subsequent visit approximately two weeks postoperatively for  another voiding trial.  The patient passed the voiding trial at that point  and the following day came in for another postvoid residual, was found to  have no urinary retention.  The patient successfully had no further problems  and has been voiding without complications ever since.                                               Carrington Clamp, M.D.    MH/MEDQ  D:  07/02/2003  T:  07/02/2003  Job:  [id number]

## 2010-11-04 NOTE — Assessment & Plan Note (Signed)
[facility]                         GASTROENTEROLOGY OFFICE NOTE   Andrea, Wilkinson                        MRN:          [id number]  DATE:07/03/2006                            DOB:          07-22-1931    PROBLEM:  Dysphagia.   REASON:  Andrea Wilkinson has returned still complaining of dysphagia to  solids and liquids.  With dysphagia she may have discomfort in her lower  chest that radiates around to her back.  She claims that symptoms did  not improve even after her initial endoscopy with dilatation.   EXAMINATION:  Pulse 65, blood pressure 132/78, weight 174.   IMPRESSION:  Persistent dysphagia and immediate postprandial abdominal  pain.  Etiology is unclear.  Persistent stricture is a consideration.   RECOMMENDATION:  Barium swallow.     Barbette Hair. Arlyce Dice, MD,FACG  Electronically Signed    RDK/MedQ  DD: 07/03/2006  DT: 07/03/2006  Job #: [id number]

## 2010-11-04 NOTE — Assessment & Plan Note (Signed)
[facility]                         GASTROENTEROLOGY OFFICE NOTE   Andrea Wilkinson, Andrea Wilkinson                        MRN:          [id number]  DATE:07/11/2006                            DOB:          08/09/1931    HISTORY OF PRESENT ILLNESS:  This is a followup visit for Andrea Wilkinson.  She was complaining of dysphagia.  She actually reports the sensation of  something in her lower chest upon recumbency.  She does not have  dysphagia, per se, when she swallows food.  She tends to be more  symptomatic if she eats meats.  A barium swallow demonstrated poor  motility, moderate reflux and very mild narrowing of her distal  esophagus.  Andrea Wilkinson actually reports improvement in her symptoms,  particularly when she eats a minimal amount of meat.   PHYSICAL EXAMINATION:  VITAL SIGNS:  Pulse 80, blood pressure 110/60,  weight 172.   IMPRESSION:  I believe her symptoms are related to acid reflux rather  than a fixed narrowing in her esophagus.   RECOMMENDATIONS:  Trial of Zegerid 40 mg q.h.s.     Barbette Hair. Arlyce Dice, MD,FACG  Electronically Signed    RDK/MedQ  DD: 07/11/2006  DT: 07/11/2006  Job #: [id number]

## 2010-11-04 NOTE — Op Note (Signed)
NAME:  Andrea Wilkinson, Andrea Wilkinson                           ACCOUNT NO.:  [id number]   MEDICAL RECORD NO.:  [id number]                   PATIENT TYPE:  INP   LOCATION:  9303                                 F[facility]ILITY[facility]   PHYSICIAN:  Carrington Clamp, M.D.              DATE OF BIRTH:  [birth year]   DATE OF PROCEDURE:  05/22/2003  DATE OF DISCHARGE:                                 OPERATIVE REPORT   PREOPERATIVE DIAGNOSES:  1. Uterine prolapse.  2. Cystocele.  3. Postmenopausal bleeding.   POSTOPERATIVE DIAGNOSES:  1. Uterine prolapse.  2. Cystocele.  3. Postmenopausal bleeding.   PROCEDURES:  1. Total vaginal hysterectomy.  2. Bilateral salpingo-oophorectomy.  3. Anterior repair.   ATTENDING:  Carrington Clamp, M.D.   ASSISTANT:  Luvenia Redden, M.D.   ANESTHESIA:  General endotracheal anesthesia.   ESTIMATED BLOOD LOSS:  400 mL.   FLUIDS REPLACED:  __________.   URINE OUTPUT:  Not measured.   COMPLICATIONS:  None.   FINDINGS:  A seven weeks' size uterus with descensus to -2, cystocele 2+1,  and normal ovaries and tubes were seen.   MEDICATIONS:  Cefotan, sterile milk, Xylocaine with epinephrine.   PATHOLOGY:  Uterus, cervix, tubes, and ovaries.   COUNTS:  Correct x3.   TECHNIQUE:  The patient had been positioned in the dorsal lithotomy while  she was awake in order to ensure that she would have minimal back and knee  pain postoperatively.  The patient was then prepped and draped in the usual  sterile fashion after the patient was under general anesthesia.  The bladder  was emptied of urine and then filled with 60 mL of sterile milk.  The  duckbill retractor was placed in the vagina and the cervix grasped with a  pair of Lahey clamps.  The cervix was injected circumferentially with  Xylocaine with epinephrine.  A circumferential incision was then made with  the scalpel around the cervix at the reflection of the vagina onto the  cervix.   The posterior peritoneum was  entered into with the Mayo scissors and the  long duckbill retractor placed.  Dissection was begun anteriorly with the  Metzenbaums to remove the vesicouterine fascia and the bladder off of the  cervix.  The uterosacrals were clamped with bilateral Heaney clamps, and  each pedicle was secured incised with the Mayo scissors and was secured with  a stitch of 0 Vicryl.  The cardinal ligament was divided in the same way and  at the level of the broad ligament, the anterior peritoneum was tented up  and entered into with the Metzenbaum scissors.  The bladder was then  retracted out of the field of dissection and the broad ligament was then  continued in the same alternating successive bites with the Heaney clamp,  followed by being incised with the Mayo scissors and then secured with a  suture of 0 Vicryl.  At  the level of the tubes and the cornua, bilateral  Heaneys were placed and each pedicle was incised with the Mayo scissors,  thus amputating the uterine specimen.  Each pedicle was secured with a free-  hand tie of 0 Vicryl followed by a stitch of 0 Vicryl.  On the right-hand  side the ovary was grasped and the Heaney clamp was placed over the  infundibulopelvic ligament, and this was removed with the Mayo scissors.  The pedicle was then secured with a free-hand tie of 0 Vicryl, followed by a  stitch fo 0 Vicryl.  On the patient's right-hand side the same procedure was  performed; however, a small vessel had been lost during the clamping.  The  pedicle and ovary were excised with the Mayo scissors and then secured with  a free-hand tie of 0 Vicryl.  This was then further secured with a stitch of  0 Vicryl.  There was some bleeding of the cuff on the right-hand side  because of the small amount that had slipped out during the capture of the  infundibulopelvic ligament.  Multiple Vicryl sutures were placed in the  pedicles as superficial as possible to not be anywhere near the ureter in an   attempt to achieve hemostasis.  This was finally achieved.   Hemostasis was achieved in all pedicles and all instruments were withdrawn  from the vagina.  The anterior peritoneum was secured with a stitch of 2-0  Vicryl.  This was then drawn around in a pursestring fashion through the  anterior peritoneum, the uterosacral, the posterior peritoneum of the cul-de-  sac, through the opposite uterosacral, and back to the anterior peritoneum.  This was cinched down, thus closing the peritoneum.  Attention was then  turned to the vagina.  The anterior vagina was grasped with a pair of  Allises and using sharp and blunt dissection in the midline with the  Metzenbaum scissors and with the aid of Allises, the vaginal mucosa  anteriorly was opened up.  Sharp and blunt dissection were then used to  retract the vesicouterine fascia from the vaginal mucosa laterally.  Three  mattress stitches of 0 Vicryl were then placed to close the cystocele  defect.  Bovie cautery was used to ensure hemostasis.  The vagina was then  trimmed and closed with a running locked stitch of 2-0 Vicryl.  The cuff was  then closed with interrupted figure-of-eights of 0 Vicryl.  The vagina was  then packed with Premarin cream.  The Foley was placed.  The patient  tolerated the procedure well and was returned to the recovery room in stable  condition.                                               Carrington Clamp, M.D.    MH/MEDQ  D:  05/22/2003  T:  05/22/2003  Job:  [id number]

## 2010-11-04 NOTE — Op Note (Signed)
   NAME:  Andrea Wilkinson, Andrea Wilkinson                           ACCOUNT NO.:  [id number]   MEDICAL RECORD NO.:  [id number]                   PATIENT TYPE:  AMB   LOCATION:  SDC                                  [facility]   PHYSICIAN:  Carrington Clamp, M.D.              DATE OF BIRTH:  [birth year]   DATE OF PROCEDURE:  12/26/2002  DATE OF DISCHARGE:                                 OPERATIVE REPORT   PREOPERATIVE DIAGNOSIS:  1. Post menopausal bleeding.  2. Thickened endometrial stripe.  3. Unable to get tissue diagnosis with endometrial biopsy.   POSTOPERATIVE DIAGNOSIS:  1. Post menopausal bleeding.  2. Thickened endometrial stripe.  3. Unable to get tissue diagnosis with endometrial biopsy.   PROCEDURE:  1. Hysteroscopy.  2. Dilation and curettage.   SURGEON:  Carrington Clamp, M.D.   ANESTHESIA:  LMA.   ESTIMATED BLOOD LOSS:  50 mL.   FLUIDS REPLACED:  600 mL.   URINE OUTPUT:  Not measured.   COMPLICATIONS:  None.   FINDINGS:  Multiple small polyps in the uterus but there are no other  abnormalities noted.   MEDICATIONS:  None.   PATHOLOGY:  Uterine curettings.   SURGICAL TECHNIQUE:  After adequate LMA anesthesia was achieved, the patient  was prepped and draped in the usual sterile fashion in the dorsal lithotomy  position .  The bladder was drained with a red rubber catheter and speculum  placed in the vagina.  The cervix was grasped with a single tooth tenaculum  and then the cervix dilated with a series of Hegar dilators.  The  hysteroscopy scope was placed in the cervix.  The above findings were noted  on hysteroscopy.  The deficit was approximately 170 mL, however, most of  that, over 100 mL, wound up on the floor because of the disconnection of a  piece of equipment.  The initial hysteroscopy was less than five minutes,  the  second hysteroscopy was less than one minute.  Therefore, the deficit was  considered extremely minimal.  The sharp curettage was then  performed after  a polypectomy with polyp forceps and tissue was obtained.  A second look was  taken and then all instruments were withdrawn from the vagina.  The patient  tolerated the procedure well.                                               Carrington Clamp, M.D.    MH/MEDQ  D:  12/26/2002  T:  12/26/2002  Job:  [id number]

## 2010-11-04 NOTE — H&P (Signed)
NAME:  Andrea Wilkinson, Andrea Wilkinson                           ACCOUNT NO.:  [id number]   MEDICAL RECORD NO.:  [id number]                   PATIENT TYPE:  INP   LOCATION:  NA                                   FACILITY:  [facility]   PHYSICIAN:  Carrington Clamp, M.D.              DATE OF BIRTH:  [birth year]   DATE OF ADMISSION:  DATE OF DISCHARGE:                                HISTORY & PHYSICAL   CHIEF COMPLAINT:  This is a 77 year old G5, P5-0-0-5, who had had  postmenopausal bleeding earlier and now is having uterine prolapse symptoms.   HISTORY OF PRESENT ILLNESS:  Andrea Wilkinson came to see me back in June of  2004, complaining of having some vaginal bleeding.  The patient underwent a  D&C hysteroscopy at that time which revealed  proliferative endometrium with  simple and focal complex hyperplasia without atypia.  The patient had been  treated then with Prometrium since to help with the hyperplasia but  subsequently has developed prolapse symptoms.  She complains of a lot of  pressure and discomfort in the vaginal area and it is difficult to sit for  prolonged periods of time.  The patient underwent complex urodynamics which  revealed no stress urinary incontinence after vaginal packing and no  detrusor instability either.  The patient strongly desired definitive  therapy and therefore was scheduled for a vaginal hysterectomy, bilateral  salpingo-oophorectomy, and anterior repair.   PAST MEDICAL HISTORY:  1. The patient has diabetes and says that her sugars currently have been     controlled with fastings in the 140s and postprandials being about 180.     She is on p.o. medications for that.  2. The patient also has high blood pressure, 140s over 90s and is on     medication for that as well.  3. The patient has no history of coronary artery disease, angina, or     problems with her kidneys.  The patient had had an examination prior to     her hysteroscopy and D&C in June and was cleared medically for  surgery at     that point.   PAST SURGICAL HISTORY:  The patient has had her gallbladder removed and some  external lipomas.   PAST OB HISTORY:  Term spontaneous vaginal delivery x5.   PAST GYN HISTORY:  Negative for sexually transmitted diseases or pelvic  infections.   MEDICATIONS:  1. Indocin which is to be held the night before the surgery.  2. Glucotrol 5 mg one p.o. q.d.  3. Benicar for high blood pressure.  4. The patient is also taking Darvocet-N 100.   ALLERGIES:  No known drug allergies.   HABITS:  Tobacco:  None.   PHYSICAL EXAMINATION:  VITAL SIGNS:  Blood pressure:  HEENT:  Anicteric.  NECK:  Without thyromegaly.  LUNGS:  Clear to auscultation.  HEART:  Regular rate and rhythm.  ABDOMEN:  Soft, slightly obese.  Nontender nondistended.  EXTREMITIES:  Benign.  PELVIC:  Revealed some urethral hypermobility, cystocele to the +1 station  below the hymenal ring, rectocele -2, uterine descensus to -2, uterine size  about seven weeks.  There were no other masses and the perineum appeared  normal.   REVIEW OF SYSTEMS:  The patient had no complaint of leaking of urine or of  need for splinting.  The patient also has not had any vaginal bleeding but  desires definitive therapy secondary to her prolapse symptoms.   ASSESSMENT:  This is a 77 year old woman with a history of complex  hyperplasia without atypia and postmenopausal bleeding whose main complaint  at this point is prolapse symptoms.  The patient desires definitive therapy.  The patient is for total vaginal hysterectomy with bilateral salpingo-  oophorectomy and anterior repair.  Complex systemetrics had been performed  on the patient and there was no leaking with vaginal packing; therefore, a  urogynecologic procedure will not be done at this time.  The patient  understands there is a risk of possibility of some leaking after the surgery  in the future.  The patient understands the risks, benefits, and   alternatives to the surgery and desires the surgery.  She will receive  preoperative antibiotics and SCDs in surgery.                                               Carrington Clamp, M.D.    MH/MEDQ  D:  05/21/2003  T:  05/21/2003  Job:  [id number]

## 2010-11-04 NOTE — Assessment & Plan Note (Signed)
[facility]                           GASTROENTEROLOGY OFFICE NOTE   ASLAN, MONTAGNA                        MRN:          [id number]  DATE:05/08/2006                            DOB:          [birth year]    PROBLEM:  Dysphagia.   Mrs. Bibby is a 77 year old white female referred through the courtesy of  Dr. Debby Bud for evaluation.  She has a history of esophageal stricture and  was last dilated in 2002.  Over the past 2 weeks, she has developed severe  dysphagia with solids.  She has pain with food lodges in her chest.  She has  been taking liquids since that time.  She denies pyrosis.  Pain develops in  her mid-chest.  It radiates to her back.   PAST MEDICAL HISTORY:  Pertinent for hypertension and diabetes.  She has  arthritis.  She is status post cholecystectomy, herniorrhaphy and tubal  ligation.   FAMILY HISTORY:  Noncontributory.   MEDICATIONS:  1. Glucotrol.  2. Enalapril.  3. Lasix.  4. Prilosec.  5. __________.  6. Belladonna.  7. Diclofenac.  8. Carafate suspension.   ALLERGIES:  SHE IS ALLERGIC TO CODEINE AND ASPIRIN.   SOCIAL HISTORY:  She neither smokes or drinks.  She is widowed and retired.   REVIEW OF SYSTEMS:  Positive for joint pains.   PHYSICAL EXAMINATION:  VITAL SIGNS:  Pulse 62, blood pressure 110/60, weight  174.  HEENT:  EOMI. PERRLA. Sclerae are anicteric.  Conjunctivae are pink.  NECK:  Supple without thyromegaly, adenopathy or carotid bruits.  CHEST:  Clear to auscultation and percussion without adventitious sounds.  CARDIAC:  Regular rhythm; normal S1 S2.  There are no murmurs, gallops or  rubs.  ABDOMEN:  Bowel sounds are normoactive.  Abdomen is soft, non-tender and non-  distended.  There are no abdominal masses, tenderness, splenic enlargement  or hepatomegaly.  EXTREMITIES:  Full range of motion.  No cyanosis, clubbing or edema.  RECTAL:  Deferred.   IMPRESSION:  Esophageal stricture.   RECOMMENDATION:  Endoscopy with balloon dilatation.     Barbette Hair. Arlyce Dice, MD,FACG  Electronically Signed    RDK/MedQ  DD: 05/08/2006  DT: 05/08/2006  Job #: [id number]

## 2010-11-07 ENCOUNTER — Inpatient Hospital Stay (HOSPITAL_COMMUNITY)
Admission: RE | Admit: 2010-11-07 | Discharge: 2010-11-10 | DRG: 470 | Disposition: A | Discharge status: Home Health | Payer: Medicare PPO | Source: Ambulatory Visit | Attending: Orthopedic Surgery | Admitting: Orthopedic Surgery

## 2010-11-07 DIAGNOSIS — Z01812 Encounter for preprocedural laboratory examination: Secondary | ICD-10-CM

## 2010-11-07 DIAGNOSIS — M1710 Unilateral primary osteoarthritis, unspecified knee: Principal | ICD-10-CM | POA: Diagnosis present

## 2010-11-07 DIAGNOSIS — E119 Type 2 diabetes mellitus without complications: Secondary | ICD-10-CM | POA: Diagnosis present

## 2010-11-07 DIAGNOSIS — I10 Essential (primary) hypertension: Secondary | ICD-10-CM | POA: Diagnosis present

## 2010-11-07 LAB — GLUCOSE, CAPILLARY: Glucose-Capillary: 208 mg/dL — ABNORMAL HIGH (ref 70–99)

## 2010-11-07 LAB — TYPE AND SCREEN: Antibody Screen: NEGATIVE

## 2010-11-07 NOTE — Op Note (Signed)
NAME:  Andrea Wilkinson, Andrea Wilkinson                 ACCOUNT NO.:  [id number]  MEDICAL RECORD NO.:  [id number]           PATIENT TYPE:  I  LOCATION:  0008                         FACILITY:  [facility]  PHYSICIAN:  Madlyn Frankel. Charlann Boxer, M.D.  DATE OF BIRTH:  09-Nov-1931  DATE OF PROCEDURE:  11/07/2010 DATE OF DISCHARGE:                              OPERATIVE REPORT   PREOPERATIVE DIAGNOSIS:  Right knee osteoarthritis.  POSTOPERATIVE DIAGNOSIS:  Right knee osteoarthritis.  PROCEDURE:  Right total knee replaced utilizing DePuy component, size 2.5 femur, 2.5 tibia, 10-mm insert, 38 patellar button.  SURGEON:  Madlyn Frankel. Charlann Boxer, M.D.  ASSISTANT:  Jaquelyn Bitter. Chabon, P.A.  ANESTHESIA:  General plus a postprocedural femoral nerve block.  SPECIMENS:  None.  COMPLICATIONS:  None.  BLOOD LOSS:  About 50 cc.  DRAINS:  One Hemovac.  TOURNIQUET TIME:  35 minutes at 250 mmHg.  INDICATIONS FOR PROCEDURE:  Ms. Pultz is a 77 year old patient of mine with bilateral knee osteoarthritis, history of left total knee replacement.  When she had gotten over her left knee replacement fully, she at this point wished to proceed with the right knee procedure.  The risks and benefits were reviewed, postoperative course and expectations discussed.  Consent was obtained for the benefit of pain relief.  PROCEDURE IN DETAIL:  The patient was brought to operative theater. Once adequate anesthesia, preoperative antibiotics, Ancef administered, she was positioned supine with the right thigh tourniquet placed.  The right lower extremity was then prepped and draped in sterile fashion with the right leg placed in the [facility] leg holder.  Time-out was performed identifying the patient, planned procedure and extremity.  Leg was exsanguinated, tourniquet elevated to 250 mmHg.  Midline incision was made followed by median arthrotomy.  Following initial exposure and debridement, attention was directed patella where precut measurement  was about 23 mm.  I resected down to about 14 mm and used a 38 patellar button to cover the cut surface as well as restore patellar height.  Attention was now directed to femur.  The femoral canal was opened with the drill, irrigated to try to prevent fat emboli.  Intramedullary rod was passed at 3 degrees of valgus, 11 mm of bone was resected off the distal femur due to preoperative flexion contracture.  Following this resection, attention was directed to tibia, it was subluxated anteriorly, further cruciate and meniscus stumps removed.  Using extramedullary guide, 10 mm resection off proximal lateral tibia was carried out.  Following removal of the bone, we confirmed the cut was perpendicular on the coronal plane as well as the extension gap would be stable for the 10 mm insert medially and laterally.  At this point I sized the femur to be a size 2.5 in anterior-posterior dimension.  The size 2.5 rotation block was pinned into position, anterior referenced using the C clamp, set rotation off the proximal tibial cut.  The 4-in-1 cutting block was placed and the anterior- posterior chamfer cuts were made without difficulty nor notching.  The final box cut was made in line up with the lateral aspect distal femur.  The tibia subluxated  anteriorly and 2.5 tibial tray fit best. It was pinned to the medial third of the tubercle, drilled and keel punched.  Trial reduction was now carried out with 2.5 femur, 2.5 tibia and 10-mm insert.  The knee came to full extension with stable ligaments medially and laterally through flexion with the patella tracking through the trochlea with application of pressure.  Given these findings, the trial components were removed from the synovial capsule junction, the knee was injected with 0.25% Marcaine with epinephrine, 1 cc of Toradol, total of 61 cc.  The knee was irrigated with normal saline solution. Final components were opened and cement mixed.  Final  components were then cemented on to clean and dried cut surface of bone.  The knee was brought to extension with a 10-mm insert and extruded cement was removed.  Once the cement had fully cured and excess cement was removed throughout the knee, the final 10-mm insert was chosen.  It was inserted and placed in the knee.  The knee was irrigated with normal saline solution again, medium Hemovac drain was placed.  The extensor mechanism was then reapproximated using #1 Vicryl with the knee in flexion.  The remainder of the wound was closed with 2-0 Vicryl and running 4-0 Monocryl.  The knee was cleaned, dried and dressed sterilely using Dermabond and Aquacel dressing.  She was then brought to recovery room extubated in stable condition tolerating the procedure well with plan for postprocedure femoral nerve block.     Madlyn Frankel Charlann Boxer, M.D.     MDO/MEDQ  D:  11/07/2010  T:  11/07/2010  Job:  [id number]  Electronically Signed by Durene Romans M.D. on 11/07/2010 01:03:54 PM

## 2010-11-07 NOTE — H&P (Signed)
  NAME:  Andrea Wilkinson, VACCA                 ACCOUNT NO.:  [id number]  MEDICAL RECORD NO.:  [id number]          PATIENT TYPE:  LOCATION:                                 FACILITY:  PHYSICIAN:  Madlyn Frankel. Charlann Boxer, M.D.  DATE OF BIRTH:  07/26/1931  DATE OF ADMISSION: DATE OF DISCHARGE:                             HISTORY & PHYSICAL   CHIEF COMPLAINTS:  Right knee pain.  HISTORY OF PRESENT ILLNESS:  The patient is a 77 year old female with worsening right knee pain secondary to end-stage osteoarthritis.  The patient elected to have right total-knee arthroplasty by Dr. Durene Romans to decrease pain and increase function.  PAST MEDICAL HISTORY:  GERD, diabetes and hypertension.  FAMILY HISTORY:  Family medical history is negative.  SOCIAL HISTORY:  The patient is a patient of Dr. Christella Noa.  Does not smoke or use alcohol.  She is retired.  DRUG ALLERGIES:  CODEINE and ASPIRIN.  CURRENT MEDICATIONS: 1. Metformin 500 mg b.i.d. 2. Glimepiride 5 mg daily. 3. Tramadol 50 mg one to two tablets q.4-6 hours p.r.n. pain. 4. Omeprazole 20 mg daily. 5. Mobic 15 mg daily. 6. Lasix 20 mg daily. 7. Indomethacin 25 mg daily. 8. Enalapril 20 mg daily. 9. Vitamins daily.  REVIEW OF SYSTEMS:  Shows pain on range of motion and pain with ambulation, especially with the right knee.  She does have a previous left knee replacement that was back in January 2012.  PHYSICAL EXAMINATION:  VITAL SIGNS:  Pulse 70, respirations 16, blood pressure 132/78. GENERAL:  The patient is a healthy-appearing, 77 year old female in no acute distress.  Pleasant mood and affect.  Alert and oriented x3. HEENT:  Examination of the head and neck shows cranial nerves II-XII are grossly intact. NECK:  Shows full range of motion without any tenderness. CHEST:  Shows active breath sounds bilaterally.  No wheezes, rhonchi or rales. CARDIOVASCULAR:  Shows regular rate and rhythm with no murmur. ABDOMEN:  Nontender,  nondistended. EXTREMITIES:  Shows moderate tenderness to the right knee, especially in the medial joint line and pain with ambulation.  She has a well-healed incision in the left anterior knee with good range of motion and function of that left knee.  She has no rashes and no edema. Neurovascularly, she is intact distally.  She does have a mildly antalgic gait.  RADIOLOGIC:  X-rays show end-stage osteoarthritis of the right knee.  IMPRESSION:  End-stage osteoarthritis of the right knee.  PLAN OF ACTION:  Right total-knee arthroplasty by Dr. Durene Romans.     Thomas B. Ferne Coe.   ______________________________ Madlyn Frankel Charlann Boxer, M.D.    TBD/MEDQ  D:  10/26/2010  T:  10/26/2010  Job:  [id number]  Electronically Signed by Standley Dakins P.A. on 11/02/2010 08:32:27 AM Electronically Signed by Durene Romans M.D. on 11/07/2010 01:03:50 PM

## 2010-11-08 LAB — BASIC METABOLIC PANEL
CO2: 30 mEq/L (ref 19–32)
Calcium: 9 mg/dL (ref 8.4–10.5)
Chloride: 101 mEq/L (ref 96–112)
Creatinine, Ser: 0.64 mg/dL (ref 0.4–1.2)
GFR calc Af Amer: >60 mL/min (ref >60)
Glucose, Bld: 146 mg/dL — ABNORMAL HIGH (ref 70–99)
Sodium: 137 mEq/L (ref 135–145)

## 2010-11-08 LAB — CBC
HCT: 33.3 % — ABNORMAL LOW (ref 36.0–46.0)
Hemoglobin: 10.8 g/dL — ABNORMAL LOW (ref 12.0–15.0)
MCHC: 32.4 g/dL (ref 30.0–36.0)
MCV: 88.6 fL (ref 78.0–100.0)
RDW: 13.2 % (ref 11.5–15.5)

## 2010-11-08 LAB — GLUCOSE, CAPILLARY
Glucose-Capillary: 185 mg/dL — ABNORMAL HIGH (ref 70–99)
Glucose-Capillary: 184 mg/dL — ABNORMAL HIGH (ref 70–99)

## 2010-11-09 LAB — GLUCOSE, CAPILLARY
Glucose-Capillary: 260 mg/dL — ABNORMAL HIGH (ref 70–99)
Glucose-Capillary: 184 mg/dL — ABNORMAL HIGH (ref 70–99)
Glucose-Capillary: 224 mg/dL — ABNORMAL HIGH (ref 70–99)
Glucose-Capillary: 227 mg/dL — ABNORMAL HIGH (ref 70–99)

## 2010-11-09 LAB — BASIC METABOLIC PANEL
CO2: 27 mEq/L (ref 19–32)
Chloride: 98 mEq/L (ref 96–112)
GFR calc Af Amer: >60 mL/min (ref >60)
Glucose, Bld: 202 mg/dL — ABNORMAL HIGH (ref 70–99)
Sodium: 134 mEq/L — ABNORMAL LOW (ref 135–145)

## 2010-11-09 LAB — CBC
HCT: 32.3 % — ABNORMAL LOW (ref 36.0–46.0)
Hemoglobin: 10.6 g/dL — ABNORMAL LOW (ref 12.0–15.0)
RBC: 3.71 MIL/uL — ABNORMAL LOW (ref 3.87–5.11)

## 2010-11-10 LAB — GLUCOSE, CAPILLARY: Glucose-Capillary: 196 mg/dL — ABNORMAL HIGH (ref 70–99)

## 2010-12-07 ENCOUNTER — Encounter: Payer: Self-pay | Admitting: Internal Medicine

## 2010-12-08 ENCOUNTER — Ambulatory Visit (INDEPENDENT_AMBULATORY_CARE_PROVIDER_SITE_OTHER)
Admission: RE | Admit: 2010-12-08 | Discharge: 2010-12-08 | Disposition: A | Discharge status: Home / Self Care | Payer: Medicare PPO | Source: Ambulatory Visit | Attending: Internal Medicine | Admitting: Internal Medicine

## 2010-12-08 ENCOUNTER — Encounter: Payer: Self-pay | Admitting: Internal Medicine

## 2010-12-08 ENCOUNTER — Ambulatory Visit (INDEPENDENT_AMBULATORY_CARE_PROVIDER_SITE_OTHER): Payer: 59 | Admitting: Internal Medicine

## 2010-12-08 ENCOUNTER — Other Ambulatory Visit (INDEPENDENT_AMBULATORY_CARE_PROVIDER_SITE_OTHER): Payer: Medicare PPO

## 2010-12-08 DIAGNOSIS — R350 Frequency of micturition: Secondary | ICD-10-CM

## 2010-12-08 DIAGNOSIS — M25551 Pain in right hip: Secondary | ICD-10-CM

## 2010-12-08 DIAGNOSIS — M25559 Pain in unspecified hip: Secondary | ICD-10-CM

## 2010-12-08 LAB — CBC WITH DIFFERENTIAL/PLATELET
Basophils Absolute: 0 10*3/uL (ref 0.0–0.1)
Eosinophils Absolute: 0.6 10*3/uL (ref 0.0–0.7)
Lymphocytes Relative: 33.8 % (ref 12.0–46.0)
Lymphs Abs: 2.7 10*3/uL (ref 0.7–4.0)
MCHC: 33.6 g/dL (ref 30.0–36.0)
Monocytes Relative: 7.7 % (ref 3.0–12.0)
Platelets: 197 10*3/uL (ref 150.0–400.0)
RDW: 14.4 % (ref 11.5–14.6)

## 2010-12-08 LAB — URINALYSIS, ROUTINE W REFLEX MICROSCOPIC
Nitrite: NEGATIVE
Urine Glucose: NEGATIVE

## 2010-12-08 MED ORDER — MELOXICAM 15 MG PO TABS: 15.0000 mg | ORAL_TABLET | Freq: Every day | ORAL | Status: DC

## 2010-12-08 NOTE — Progress Notes (Signed)
  Subjective:    Patient ID: Andrea Wilkinson, female    DOB: 1932/04/07, 77 y.o.   MRN: [id number]  HPI [name] presents with several c/o. She is troubled with incontinence usually in the morning. She will have occasional problem later in the day. She does have a sense of urgency and can have accidents. No frequency. No dysuria or other signs of infection. This has been going on for 6 - 8 weeks.   She is having pain in the right hip that is new. She has a h/o left hip pain that is not bothering her now. She has not had x-rays of the right hip. She also is having low back pain.    Review of Systems  Constitutional: Negative for fever, chills, activity change and fatigue.  HENT: Negative for congestion, neck pain, sinus pressure and tinnitus.   Eyes: Negative for pain, discharge, itching and visual disturbance.  Respiratory: Negative for cough, chest tightness, shortness of breath and wheezing.   Cardiovascular: Negative.   Gastrointestinal: Negative.   Genitourinary: Positive for urgency and frequency. Negative for hematuria, flank pain and pelvic pain.  Musculoskeletal: Positive for back pain, joint swelling, arthralgias and gait problem.  Skin: Negative.   Neurological: Negative.   Hematological: Negative.   Psychiatric/Behavioral: Negative for confusion, self-injury, dysphoric mood and agitation. The patient is not hyperactive.        Objective:   Physical Exam Vital reviewed - normal Gen'l-overweight older white woman in no acute distress. Chest - good breath sounds, w/o rales, wheeze Cor RRR Ext - able to step-up to exam. Discomfort with movement of the right hip located at the great gluteus and along the quads. No click in the hip.       Assessment & Plan:  1. Urinary frequency and incontinence that is new. Need to r/o infection and if there is none will consider a trial of anticholinesterase for irritable bladder  Plan - U/A, CBC  Addendum - U/A negative, WBC  normal  2. Hip pain - symptoms suggestive of OA right hip  Plan - x-ray right hip.           Meloxicam 15mg  qd for pain prn. GI precautions given.  Adddendum  *RADIOLOGY REPORT*  Clinical Data: History of right hip pain. No history of injury.  RIGHT HIP - COMPLETE 2+ VIEW  Comparison: None.  Findings: There is obesity. SI joints and pubic symphysis appear  intact. Phleboliths are seen in the pelvis. There is narrowing of  the superior aspect of the right hip joint compared to the left hip  joint. There is marginal osteophyte formation. These findings  indicate osteoarthritic changes. No calcific bursitis, fracture,  or bony destruction is seen. There is slightly osteopenic  appearance of bones.  IMPRESSION:  No fracture or dislocation. Osteoarthritis changes. Osteopenic  appearance of bones.  Original Report Authenticated By: Crawford Givens, M.D.

## 2010-12-12 ENCOUNTER — Encounter: Payer: Self-pay | Admitting: Internal Medicine

## 2010-12-14 ENCOUNTER — Telehealth: Payer: Self-pay | Admitting: *Deleted

## 2010-12-14 MED ORDER — BELLADONNA ALK-PHENOBARBITAL 16.2 MG/5ML PO ELIX: 5.0000 mL | ORAL_SOLUTION | Freq: Every day | ORAL | Status: DC

## 2010-12-14 MED ORDER — TRAMADOL HCL 50 MG PO TABS: 50.0000 mg | ORAL_TABLET | Freq: Four times a day (QID) | ORAL | Status: DC | PRN

## 2010-12-14 MED ORDER — ENALAPRIL MALEATE 20 MG PO TABS: 20.0000 mg | ORAL_TABLET | Freq: Every day | ORAL | Status: DC

## 2010-12-14 MED ORDER — METFORMIN HCL 1000 MG PO TABS: 1000.0000 mg | ORAL_TABLET | Freq: Two times a day (BID) | ORAL | Status: DC

## 2010-12-14 MED ORDER — CYCLOBENZAPRINE HCL 5 MG PO TABS: 5.0000 mg | ORAL_TABLET | Freq: Three times a day (TID) | ORAL | Status: DC | PRN

## 2010-12-14 MED ORDER — FUROSEMIDE 20 MG PO TABS: 20.0000 mg | ORAL_TABLET | Freq: Every day | ORAL | Status: DC

## 2010-12-14 MED ORDER — GLIPIZIDE 5 MG PO TABS: 5.0000 mg | ORAL_TABLET | Freq: Every day | ORAL | Status: DC

## 2010-12-14 MED ORDER — MELOXICAM 15 MG PO TABS: 15.0000 mg | ORAL_TABLET | Freq: Every day | ORAL | Status: DC

## 2010-12-14 MED ORDER — INDOMETHACIN 25 MG PO CAPS: 25.0000 mg | ORAL_CAPSULE | Freq: Four times a day (QID) | ORAL | Status: DC | PRN

## 2010-12-14 MED ORDER — OMEPRAZOLE 20 MG PO CPDR: 20.0000 mg | DELAYED_RELEASE_CAPSULE | Freq: Every day | ORAL | Status: DC

## 2010-12-14 NOTE — Telephone Encounter (Signed)
Patient requesting a call back

## 2010-12-14 NOTE — Telephone Encounter (Signed)
Returned call to Patient//LMOVM for her to call back

## 2010-12-14 NOTE — Telephone Encounter (Signed)
Patient notified per MD and advised to look for letter in the mail. She also states that MD was to [facility]. Rx sent erx and letter resent// no record of it being mailed in log book.

## 2010-12-14 NOTE — Telephone Encounter (Signed)
Addended by: Rock Nephew T on: 12/14/2010 03:51 PM   Modules accepted: Orders

## 2010-12-19 ENCOUNTER — Ambulatory Visit: Payer: Medicare PPO

## 2011-01-02 NOTE — Discharge Summary (Signed)
NAME:  Andrea Wilkinson, Andrea Wilkinson                 ACCOUNT NO.:  [id number]  MEDICAL RECORD NO.:  [id number]  LOCATION:  1620                         FACILITY:  [facility]  PHYSICIAN:  Madlyn Frankel. Charlann Boxer, M.D.  DATE OF BIRTH:  [birth year]  DATE OF ADMISSION:  11/07/2010 DATE OF DISCHARGE:  11/10/2010                              DISCHARGE SUMMARY   ADMITTING DIAGNOSIS:  Right knee osteoarthritis.  DISCHARGE DIAGNOSES: 1. Right knee osteoarthritis. 2. Reflux disease. 3. Diabetes. 4. Hypertension.  ADMITTING HISTORY:  Ms. Direnzo is a 77 year old patient of mine with worsening right knee pain secondary to radiographic findings of osteoarthritis.  She had failed conservative measures and at this point ready to proceed with more definitive measures of total knee replacement.  We reviewed the risks and benefits in the office, consent was obtained based on our discussion in the office, plan for home discharge with physical therapy.  HOSPITAL COURSE:  The patient admitted for same-day surgery on Nov 07, 2010.  She underwent a right total knee replacement, was uncomplicated. Please see dictated operative note for full details of the procedure. Postoperatively, she was transferred to the recovery room.  After routine stay in the recovery room, she was transferred to the orthopedic ward where she remained for a 3-day hospital stay.  On day #1, she was seen and evaluated by Physical Therapy and began mobilization.  She had her Hemovac drain and Foley catheter removed and started on a diabetic diet.  By postoperative day #2, her labs were stable.  She did not require transfusion.  She had hematocrit of 32.3.  Electrolytes were normal. Her right knee was noted to be clean, dry, and intact without significant swelling.  Dressing changed.  There was discussion about going home on postoperative day #2 or day #3.  Due to a little bit of slow progress on day #2, she stayed until Nov 10, 2010, at which point she  was ready to be discharged home with home health physical therapy.  DISCHARGE INSTRUCTIONS:  The patient will be discharged home on Nov 10, 2010.  She will return to see Dr. Durene Romans in 2 weeks' time for followup at [facility] office, [phone].  Any orthopedic questions can be addressed to our office.  DISCHARGE MEDICATIONS:  Her discharge medications will include her home medications including: 1. Colace 200 mg p.o. b.i.d. for constipation on pain medicine. 2. MiraLax 17 g p.o. daily as needed for constipation on pain     medicine. 3. Aspirin 325 mg p.o. b.i.d. for 30 days. 4. Iron 325 mg once or twice a day for 2 weeks. 5. Norco 7.5/325 one to two tabs every 4 to 6 hours as needed for     pain. 6. Robaxin 500 mg every 6 hours as needed for muscle spasm, pain. 7. Also belladonna alkalosis with phenobarbital 1 tablet every 12     hours as needed. 8. Caltrate and vitamin D q.h.s. 9. Enalapril 20 mg q.h.s. 10.Lasix 20 mg q.a.m. 11.Indocin as needed for hip pain. 12.Meloxicam as needed. 13.Metformin 500 mg b.i.d. 14.Omeprazole 20 mg q.a.m. 15.Tramadol as needed.     Madlyn Frankel Charlann Boxer, M.D.     MDO/MEDQ  D:  01/02/2011  T:  01/02/2011  Job:  [id number]  Electronically Signed by Durene Romans M.D. on 01/02/2011 10:09:37 PM

## 2011-01-09 ENCOUNTER — Ambulatory Visit: Payer: Medicare PPO | Attending: Orthopedic Surgery

## 2011-01-09 DIAGNOSIS — IMO0001 Reserved for inherently not codable concepts without codable children: Secondary | ICD-10-CM | POA: Insufficient documentation

## 2011-01-09 DIAGNOSIS — M2550 Pain in unspecified joint: Secondary | ICD-10-CM | POA: Insufficient documentation

## 2011-01-09 DIAGNOSIS — R262 Difficulty in walking, not elsewhere classified: Secondary | ICD-10-CM | POA: Insufficient documentation

## 2011-01-09 DIAGNOSIS — M2560 Stiffness of unspecified joint, not elsewhere classified: Secondary | ICD-10-CM | POA: Insufficient documentation

## 2011-03-27 ENCOUNTER — Ambulatory Visit (INDEPENDENT_AMBULATORY_CARE_PROVIDER_SITE_OTHER): Payer: Medicare PPO | Admitting: Internal Medicine

## 2011-03-27 VITALS — BP 134/62 | HR 85 | Temp 97.9°F | Wt 164.0 lb

## 2011-03-27 DIAGNOSIS — M545 Low back pain: Secondary | ICD-10-CM

## 2011-03-27 DIAGNOSIS — M1990 Unspecified osteoarthritis, unspecified site: Secondary | ICD-10-CM

## 2011-03-27 NOTE — Patient Instructions (Signed)
Low back pain - on exam there is no evidence of a pinched nerve or any surgical problem. You are right about probably arthritis.   Plan - continue your meloxicam and tramadol           Physical therapy referral specifically for back exercise.

## 2011-03-28 NOTE — Assessment & Plan Note (Signed)
Back exam w/o radiculopathy. 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  Plan - try to get copy of x-ray report from Dr. Nilsa Nutting office           Refer to PT          Continue meloxicam

## 2011-03-28 NOTE — Progress Notes (Signed)
  Subjective:    Patient ID: Andrea Wilkinson, female    DOB: [birth year], 77 y.o.   MRN: [id number]  HPI Andrea Wilkinson has a h/o low back pain over the past several years. She reports a recent flare of low back pain. She was seeing Dr. Charlann Boxer for follow up after knee surgery. She reports that he had x-ray done that revealed DJD lumbar spine. He did administer an injection but she continues to have pain. She denies bowel incontinence but has chronic urinary incontinence. She has not had focal weakness or profound paresthesia.   I have reviewed the patient's medical history in detail and updated the computerized patient record.    Review of Systems System review is negative for any constitutional, cardiac, pulmonary, GI or neuro symptoms or complaints other than as described in the HPI.     Objective:   Physical Exam Vitals noted - good BP control Gen'l - short statured white woman in no acute distress Pulm - normal respirations Cor - RRR Back - Back exam: normal stand; normal flex to greater than 100 degrees; slow, slightly broad-based gait; normal toe/heel walk; normal step up to exam table w/ some  Difficulty due to TKR; normal SLR sitting; abnormal DTRs at the patellar tendons 2nd to TKRs; normal sensation to light touch, pin-prick but decreased  deep vibratory stimulus; no  CVA tenderness; able to move supine to sitting without assistance.        Assessment & Plan:

## 2011-04-07 ENCOUNTER — Ambulatory Visit: Payer: Medicare PPO | Attending: Orthopedic Surgery | Admitting: Physical Therapy

## 2011-04-07 DIAGNOSIS — R262 Difficulty in walking, not elsewhere classified: Secondary | ICD-10-CM | POA: Insufficient documentation

## 2011-04-07 DIAGNOSIS — M2550 Pain in unspecified joint: Secondary | ICD-10-CM | POA: Insufficient documentation

## 2011-04-07 DIAGNOSIS — IMO0001 Reserved for inherently not codable concepts without codable children: Secondary | ICD-10-CM | POA: Insufficient documentation

## 2011-04-07 DIAGNOSIS — M2560 Stiffness of unspecified joint, not elsewhere classified: Secondary | ICD-10-CM | POA: Insufficient documentation

## 2011-04-13 ENCOUNTER — Ambulatory Visit (INDEPENDENT_AMBULATORY_CARE_PROVIDER_SITE_OTHER): Payer: Medicare PPO | Admitting: Internal Medicine

## 2011-04-13 VITALS — BP 158/72 | HR 85 | Temp 98.1°F | Wt 169.0 lb

## 2011-04-13 DIAGNOSIS — E119 Type 2 diabetes mellitus without complications: Secondary | ICD-10-CM

## 2011-04-13 NOTE — Progress Notes (Signed)
  Subjective:    Patient ID: Andrea Wilkinson, female    DOB: 1932-02-27, 77 y.o.   MRN: [id number]  HPI Patient presents to discuss last lab, done outside, with a A1C of 8.4%. She admits that she hasn't been taking her medicine as she is supposed to. Reviewed eChart - A1C Dec '11 - 8.0% Her diet can use some improvement. She has been asymptomatic.  I have reviewed the patient's medical history in detail and updated the computerized patient record.    Review of Systems Negative 5 symptom review    Objective:   Physical Exam Vitals reviewed: mildly elevated BP Gen'l - overweight older white woman in no acute distress HEENT - C&S clear, PERRLA PUlm - normal respirations Cor - RRR       Assessment & Plan:

## 2011-04-13 NOTE — Patient Instructions (Signed)
A1C, a long-term test for blood sugar, was 8% in December '11 and 8.4% October 11th. Goal is 7%.  Plan - stop the glipizide - this can cause low blood sugar.            Continue the metformin 1000 mg twice a day           DIET- NO SUGAR!!! Low carbohydrates - rice, potatoes, etc           Check your  Blood sugar in AM and at other times during the day for a week and keep a record of the readings that you can bring to me for review.

## 2011-04-14 NOTE — Assessment & Plan Note (Signed)
Suboptimal control: goal is 7-7.5% A1C.  Plan - no change in medications but she is encouraged to take her medications as directed.           Follow-up A1C in 3 months.

## 2011-04-19 ENCOUNTER — Ambulatory Visit: Payer: Medicare PPO | Admitting: Physical Therapy

## 2011-04-21 ENCOUNTER — Ambulatory Visit: Payer: Medicare PPO | Attending: Internal Medicine | Admitting: Physical Therapy

## 2011-04-21 DIAGNOSIS — IMO0001 Reserved for inherently not codable concepts without codable children: Secondary | ICD-10-CM | POA: Insufficient documentation

## 2011-04-21 DIAGNOSIS — M545 Low back pain, unspecified: Secondary | ICD-10-CM | POA: Insufficient documentation

## 2011-04-25 ENCOUNTER — Ambulatory Visit: Payer: Medicare PPO | Admitting: Physical Therapy

## 2011-04-27 ENCOUNTER — Ambulatory Visit: Payer: Medicare PPO | Admitting: Physical Therapy

## 2011-04-28 ENCOUNTER — Telehealth: Payer: Self-pay | Admitting: *Deleted

## 2011-04-28 NOTE — Telephone Encounter (Signed)
Ok for refill for 3 month supply

## 2011-04-28 NOTE — Telephone Encounter (Signed)
Mail order refill for Tramadol 50 mg one tablet every 6 hours prn

## 2011-05-01 ENCOUNTER — Other Ambulatory Visit: Payer: Self-pay | Admitting: *Deleted

## 2011-05-01 MED ORDER — FUROSEMIDE 20 MG PO TABS: 20.0000 mg | ORAL_TABLET | Freq: Every day | ORAL | Status: DC

## 2011-05-01 MED ORDER — ENALAPRIL MALEATE 20 MG PO TABS: 20.0000 mg | ORAL_TABLET | Freq: Every day | ORAL | Status: DC

## 2011-05-01 MED ORDER — CALCIUM CARBONATE-VITAMIN D 600-400 MG-UNIT PO TABS: 1.0000 | ORAL_TABLET | Freq: Every day | ORAL | Status: DC

## 2011-05-01 MED ORDER — METFORMIN HCL 1000 MG PO TABS: 1000.0000 mg | ORAL_TABLET | Freq: Two times a day (BID) | ORAL | Status: DC

## 2011-05-01 MED ORDER — OMEPRAZOLE 20 MG PO CPDR: 20.0000 mg | DELAYED_RELEASE_CAPSULE | Freq: Every day | ORAL | Status: DC

## 2011-05-01 MED ORDER — TRAMADOL HCL 50 MG PO TABS: 50.0000 mg | ORAL_TABLET | Freq: Four times a day (QID) | ORAL | Status: DC | PRN

## 2011-05-01 NOTE — Telephone Encounter (Signed)
[facility]

## 2011-05-02 ENCOUNTER — Encounter: Payer: Medicare PPO | Admitting: Physical Therapy

## 2011-05-05 ENCOUNTER — Encounter: Payer: Medicare PPO | Admitting: Physical Therapy

## 2011-05-08 ENCOUNTER — Encounter: Payer: Medicare PPO | Admitting: Physical Therapy

## 2011-05-10 ENCOUNTER — Encounter: Payer: Medicare PPO | Admitting: Physical Therapy

## 2011-06-29 ENCOUNTER — Telehealth: Payer: Self-pay | Admitting: *Deleted

## 2011-06-29 MED ORDER — METFORMIN HCL 1000 MG PO TABS: 1000.0000 mg | ORAL_TABLET | Freq: Two times a day (BID) | ORAL | Status: DC

## 2011-06-29 MED ORDER — TRAMADOL HCL 50 MG PO TABS: 50.0000 mg | ORAL_TABLET | Freq: Four times a day (QID) | ORAL | Status: DC | PRN

## 2011-06-29 MED ORDER — CYCLOBENZAPRINE HCL 5 MG PO TABS: 5.0000 mg | ORAL_TABLET | Freq: Three times a day (TID) | ORAL | Status: DC | PRN

## 2011-06-29 MED ORDER — OMEPRAZOLE 20 MG PO CPDR: 20.0000 mg | DELAYED_RELEASE_CAPSULE | Freq: Every day | ORAL | Status: DC

## 2011-06-29 MED ORDER — ENALAPRIL MALEATE 20 MG PO TABS: 20.0000 mg | ORAL_TABLET | Freq: Every day | ORAL | Status: DC

## 2011-06-29 MED ORDER — FUROSEMIDE 20 MG PO TABS: 20.0000 mg | ORAL_TABLET | Freq: Every day | ORAL | Status: DC

## 2011-06-29 MED ORDER — MELOXICAM 15 MG PO TABS: 15.0000 mg | ORAL_TABLET | Freq: Every day | ORAL | Status: DC

## 2011-06-29 NOTE — Telephone Encounter (Signed)
Pt. Called requesting written refill on all medications #90 with 3 refills.

## 2011-06-29 NOTE — Telephone Encounter (Signed)
Pt. Called requesting refill for Donnatal 16.2mg /46mL with 3 refills. Last refilled on 6.27.12. Indocin 25mg  with 3 refills. Last refilled on 6.27.12. Ok to refill?

## 2011-06-30 MED ORDER — BELLADONNA ALK-PHENOBARBITAL 16.2 MG/5ML PO ELIX: 5.0000 mL | ORAL_SOLUTION | Freq: Every day | ORAL | Status: DC

## 2011-06-30 MED ORDER — INDOMETHACIN 25 MG PO CAPS: 25.0000 mg | ORAL_CAPSULE | Freq: Four times a day (QID) | ORAL | Status: DC | PRN

## 2011-06-30 NOTE — Telephone Encounter (Signed)
Done

## 2011-06-30 NOTE — Telephone Encounter (Signed)
Ok for refills x 3 

## 2011-07-01 IMAGING — CR DG HIP COMPLETE 2+V*R*
3 series · 3 of 3 positions shown · non-contrast
Comparison: None.

CLINICAL DATA: History of right hip pain.  No history of injury.

RIGHT HIP - COMPLETE 2+ VIEW

[view not recorded (1 of 3)]
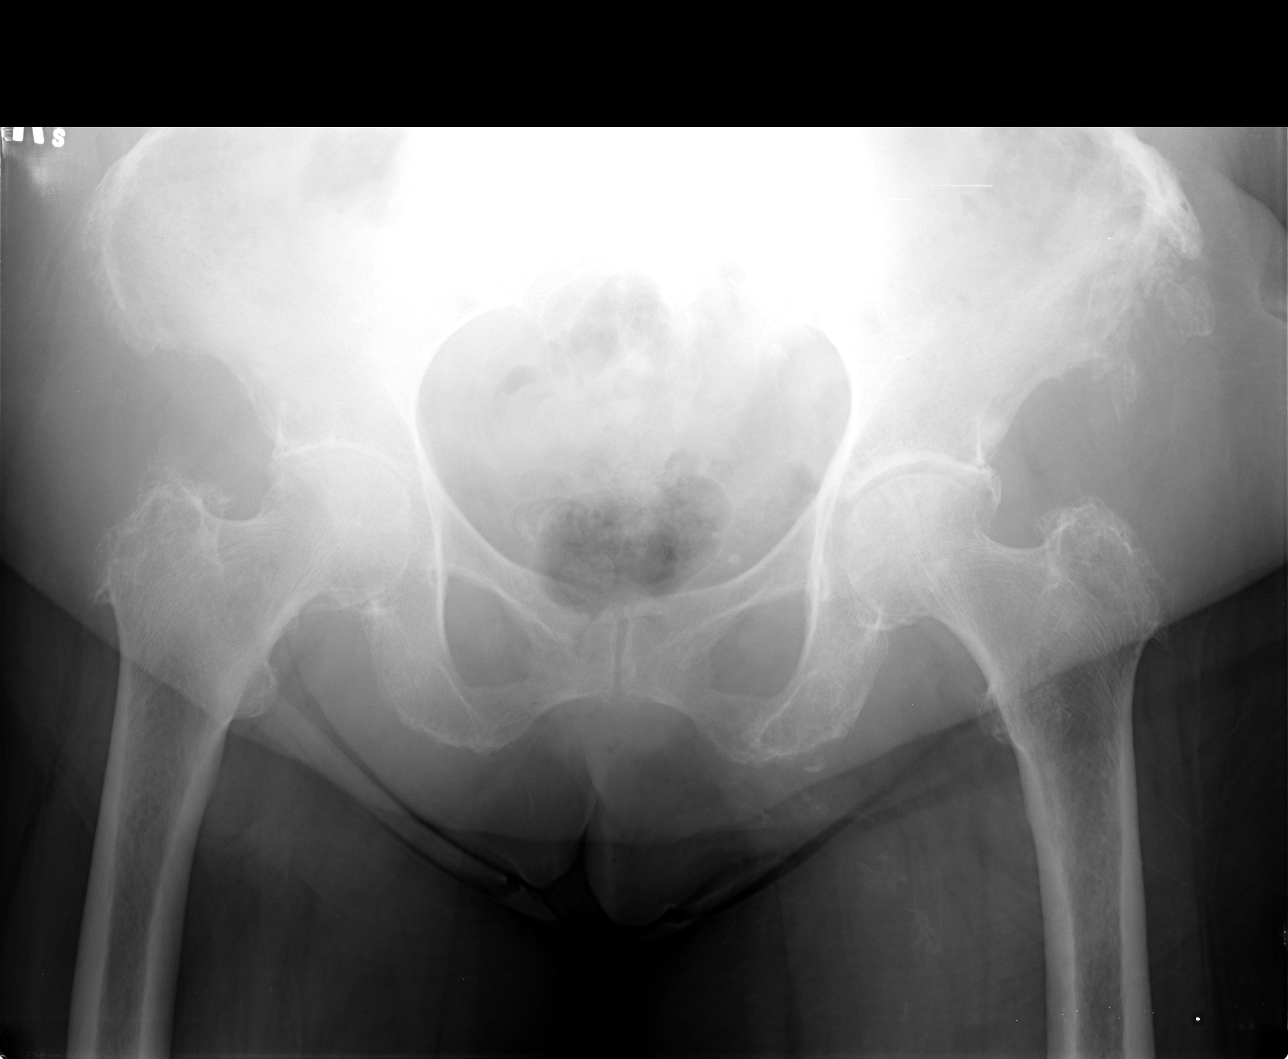

[view not recorded (2 of 3)]
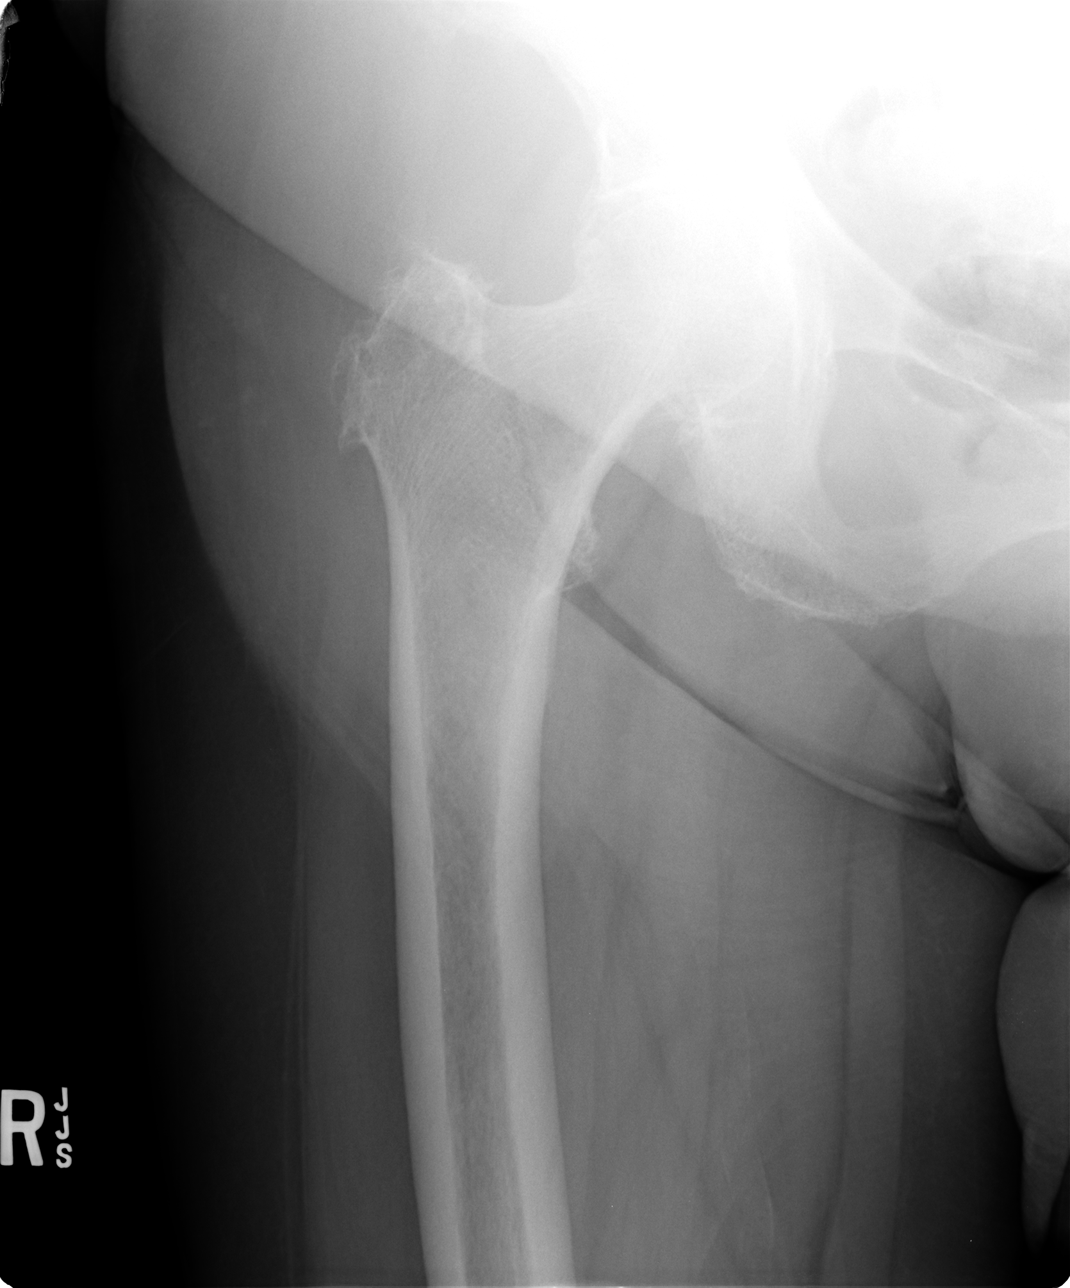

[view not recorded (3 of 3)]
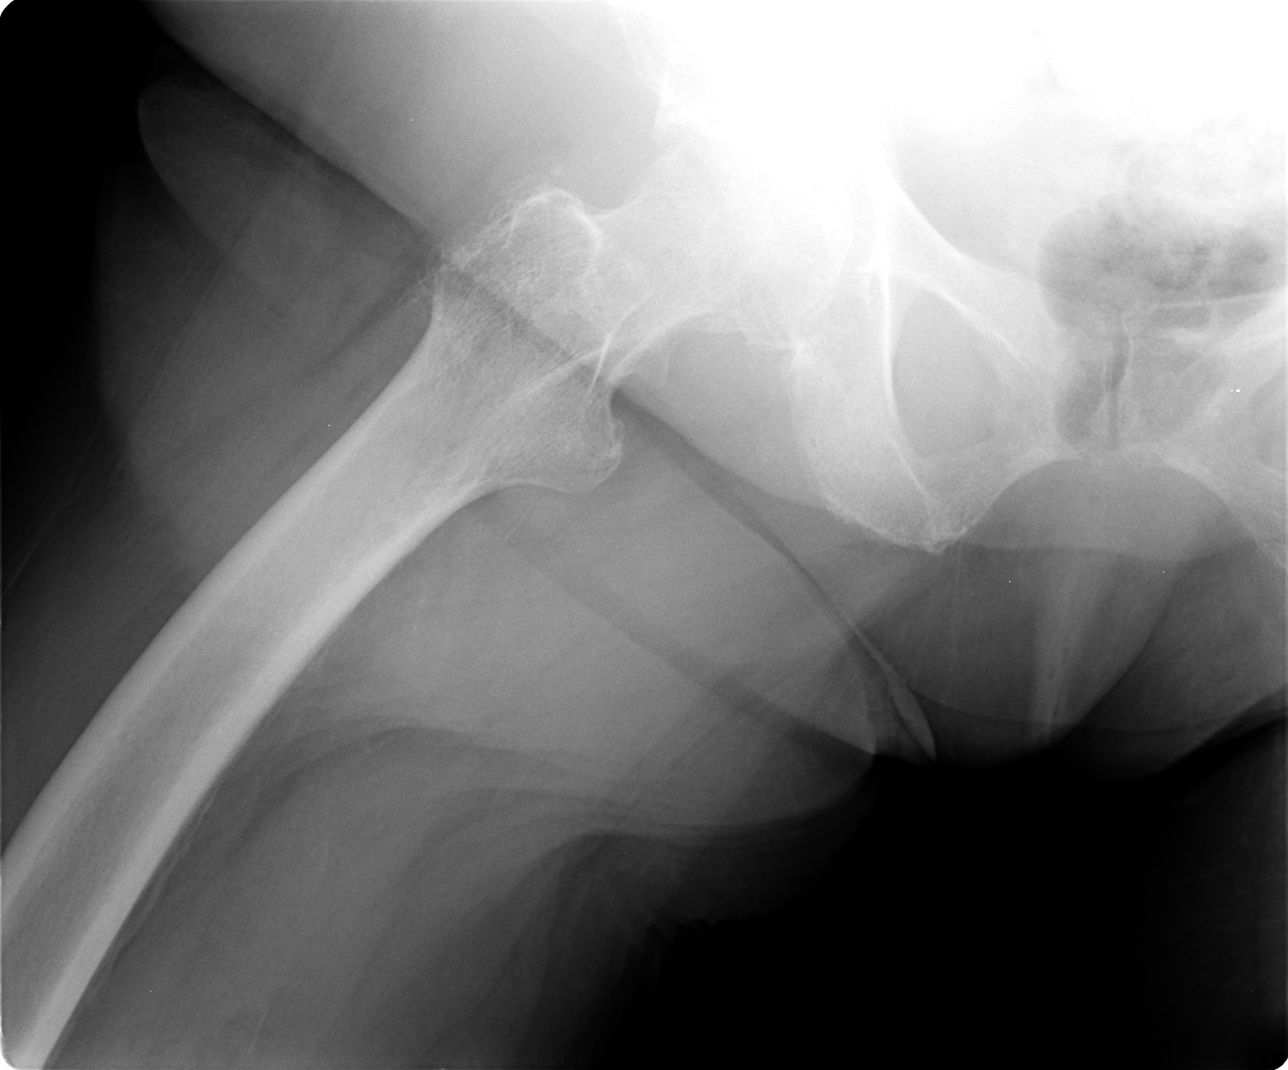

[3 of 3 positions shown; findings below may reference images not displayed]

FINDINGS: There is obesity.  SI joints and pubic symphysis appear
intact.  Phleboliths are seen in the pelvis.  There is narrowing of
the superior aspect of the right hip joint compared to the left hip
joint.  There is marginal osteophyte formation.  These findings
indicate osteoarthritic changes.  No calcific bursitis, fracture,
or bony destruction is seen.  There is slightly osteopenic
appearance of bones.
IMPRESSION: No fracture or dislocation.  Osteoarthritis changes.  Osteopenic
appearance of bones.

## 2011-07-04 ENCOUNTER — Other Ambulatory Visit: Payer: Self-pay

## 2011-07-04 MED ORDER — INDOMETHACIN 25 MG PO CAPS: 25.0000 mg | ORAL_CAPSULE | Freq: Four times a day (QID) | ORAL | Status: DC | PRN

## 2011-07-04 MED ORDER — BELLADONNA ALK-PHENOBARBITAL 16.2 MG/5ML PO ELIX: 5.0000 mL | ORAL_SOLUTION | Freq: Every day | ORAL | Status: DC

## 2011-07-04 NOTE — Telephone Encounter (Signed)
Pt called stating her Rx went to the wrong mail order company (see phone note 06/29/2011). Pt is requesting a re-print of medication and for them to be mailed to her home.

## 2011-07-04 NOTE — Telephone Encounter (Signed)
Rx signed and mailed to pt per her request. Address verified.

## 2011-07-12 ENCOUNTER — Other Ambulatory Visit: Payer: Self-pay

## 2011-07-12 MED ORDER — METFORMIN HCL 1000 MG PO TABS: 1000.0000 mg | ORAL_TABLET | Freq: Two times a day (BID) | ORAL | Status: DC

## 2011-07-12 MED ORDER — OMEPRAZOLE 20 MG PO CPDR: 20.0000 mg | DELAYED_RELEASE_CAPSULE | Freq: Every day | ORAL | Status: DC

## 2011-07-12 MED ORDER — ENALAPRIL MALEATE 20 MG PO TABS: 20.0000 mg | ORAL_TABLET | Freq: Every day | ORAL | Status: DC

## 2011-07-12 MED ORDER — FUROSEMIDE 20 MG PO TABS: 20.0000 mg | ORAL_TABLET | Freq: Every day | ORAL | Status: DC

## 2011-07-12 MED ORDER — CYCLOBENZAPRINE HCL 5 MG PO TABS: 5.0000 mg | ORAL_TABLET | Freq: Three times a day (TID) | ORAL | Status: DC | PRN

## 2011-07-12 MED ORDER — MELOXICAM 15 MG PO TABS: 15.0000 mg | ORAL_TABLET | Freq: Every day | ORAL | Status: DC

## 2011-07-12 NOTE — Telephone Encounter (Signed)
I regret that her Rx's were sent to the wrong pharmacy. She may certainly have all her prescriptions in print.

## 2011-07-12 NOTE — Telephone Encounter (Signed)
Patient called stating Flexeril, Lasix, Meloxicam, Metformin and Omeprazole were sent to the wrong pharmacy. She is requesting these be re-printed and mailed to her home, she can then mail in, please advise.

## 2011-07-12 NOTE — Telephone Encounter (Signed)
Called informed the patient prescription will be mailed as requested.

## 2011-07-18 ENCOUNTER — Telehealth: Payer: Self-pay | Admitting: *Deleted

## 2011-07-18 MED ORDER — TRAMADOL HCL 50 MG PO TABS: 50.0000 mg | ORAL_TABLET | Freq: Four times a day (QID) | ORAL | Status: DC | PRN

## 2011-07-18 MED ORDER — METFORMIN HCL 1000 MG PO TABS: 1000.0000 mg | ORAL_TABLET | Freq: Two times a day (BID) | ORAL | Status: DC

## 2011-07-18 NOTE — Telephone Encounter (Signed)
Patient called and left voice message requesting a return phone call regarding her Rx's.  Call was returned to patient regarding Rx 's. She is requesting metformin and tramadol to [facility] order. She was informed Rx will be sent to pharmacy. She was advised to check with her mail order that the rx's have been received. Patient verbalized understanding and agrees as instructed.

## 2011-07-27 ENCOUNTER — Telehealth: Payer: Self-pay | Admitting: *Deleted

## 2011-07-27 MED ORDER — TRAMADOL HCL 50 MG PO TABS: 50.0000 mg | ORAL_TABLET | Freq: Four times a day (QID) | ORAL | Status: DC | PRN

## 2011-07-27 NOTE — Telephone Encounter (Signed)
Faxed script back to [facility], and notified pt with update... 07/27/11@4 :24pm/LMB

## 2011-07-27 NOTE — Telephone Encounter (Signed)
Pt  States she has been trying to get her tramadol rx renew x's 1 month. Per EPIC refill was sent back to [facility] on 07/18/11. Pt states they told her they was waiting on md response. Inform pt will call [facility] @ [phone] to check on status... 07/27/11@3 :13pm/LMB

## 2011-07-27 NOTE — Telephone Encounter (Signed)
Called [facility] they states did received electronic rx for tramadol, but they are not able to accept control substance electronically, will ned rx fax with md signature. Reprinted rx will get md to sign and fax back to [phone]... 07/27/11@3 :22pm/LMB

## 2011-11-07 ENCOUNTER — Encounter (INDEPENDENT_AMBULATORY_CARE_PROVIDER_SITE_OTHER): Payer: Medicare Other | Admitting: Ophthalmology

## 2011-11-07 DIAGNOSIS — E1139 Type 2 diabetes mellitus with other diabetic ophthalmic complication: Secondary | ICD-10-CM

## 2011-11-07 DIAGNOSIS — H3530 Unspecified macular degeneration: Secondary | ICD-10-CM

## 2011-11-07 DIAGNOSIS — E11319 Type 2 diabetes mellitus with unspecified diabetic retinopathy without macular edema: Secondary | ICD-10-CM

## 2011-11-07 DIAGNOSIS — H43819 Vitreous degeneration, unspecified eye: Secondary | ICD-10-CM

## 2011-11-07 DIAGNOSIS — H2510 Age-related nuclear cataract, unspecified eye: Secondary | ICD-10-CM

## 2011-11-08 ENCOUNTER — Encounter (INDEPENDENT_AMBULATORY_CARE_PROVIDER_SITE_OTHER): Payer: Medicare PPO | Admitting: Ophthalmology

## 2011-12-15 ENCOUNTER — Other Ambulatory Visit: Payer: Self-pay | Admitting: *Deleted

## 2011-12-15 NOTE — Telephone Encounter (Signed)
Patient request refill on cyclobenzarprine tab 5mg . LOV  03/27/2011

## 2011-12-15 NOTE — Telephone Encounter (Signed)
Ok x 5 

## 2011-12-18 HISTORY — PX: CATARACT EXTRACTION, BILATERAL: SHX1313

## 2011-12-18 MED ORDER — CYCLOBENZAPRINE HCL 5 MG PO TABS: 5.0000 mg | ORAL_TABLET | Freq: Three times a day (TID) | ORAL | Status: DC | PRN

## 2011-12-18 NOTE — Telephone Encounter (Signed)
Refill sent to  [facility] .  Cyclobenzaprine. Patient notified

## 2011-12-19 ENCOUNTER — Other Ambulatory Visit: Payer: Self-pay | Admitting: *Deleted

## 2011-12-19 MED ORDER — CYCLOBENZAPRINE HCL 5 MG PO TABS: 5.0000 mg | ORAL_TABLET | Freq: Three times a day (TID) | ORAL | Status: DC | PRN

## 2011-12-19 NOTE — Telephone Encounter (Signed)
Ok for refill on tramadol x 5

## 2011-12-19 NOTE — Telephone Encounter (Signed)
PATIENT RX SENT TO [facility] FOR CYCLOBENZARPRINE. PATIENT ALSO NEED REFILL ON TRAMADOL TO SENT TO [facility].

## 2011-12-20 MED ORDER — TRAMADOL HCL 50 MG PO TABS: 50.0000 mg | ORAL_TABLET | Freq: Four times a day (QID) | ORAL | Status: DC | PRN

## 2011-12-20 NOTE — Telephone Encounter (Signed)
Tramadol refill to [facility]

## 2012-01-17 ENCOUNTER — Other Ambulatory Visit: Payer: Self-pay | Admitting: *Deleted

## 2012-01-17 MED ORDER — INDOMETHACIN 25 MG PO CAPS: 25.0000 mg | ORAL_CAPSULE | Freq: Four times a day (QID) | ORAL | Status: DC | PRN

## 2012-01-17 NOTE — Telephone Encounter (Signed)
Refill indomethacin to [facility]

## 2012-01-19 ENCOUNTER — Other Ambulatory Visit: Payer: Self-pay | Admitting: *Deleted

## 2012-01-19 MED ORDER — TRAMADOL HCL 50 MG PO TABS: 50.0000 mg | ORAL_TABLET | Freq: Four times a day (QID) | ORAL | Status: DC | PRN

## 2012-01-19 MED ORDER — CYCLOBENZAPRINE HCL 5 MG PO TABS: 5.0000 mg | ORAL_TABLET | Freq: Three times a day (TID) | ORAL | Status: DC | PRN

## 2012-02-26 ENCOUNTER — Other Ambulatory Visit (INDEPENDENT_AMBULATORY_CARE_PROVIDER_SITE_OTHER): Payer: Medicare Other

## 2012-02-26 ENCOUNTER — Ambulatory Visit (INDEPENDENT_AMBULATORY_CARE_PROVIDER_SITE_OTHER): Payer: Medicare Other | Admitting: Internal Medicine

## 2012-02-26 ENCOUNTER — Encounter: Payer: Self-pay | Admitting: Internal Medicine

## 2012-02-26 VITALS — BP 180/84 | HR 89 | Temp 97.2°F | Resp 16 | Wt 166.0 lb

## 2012-02-26 DIAGNOSIS — Z23 Encounter for immunization: Secondary | ICD-10-CM

## 2012-02-26 DIAGNOSIS — I10 Essential (primary) hypertension: Secondary | ICD-10-CM

## 2012-02-26 DIAGNOSIS — D509 Iron deficiency anemia, unspecified: Secondary | ICD-10-CM

## 2012-02-26 DIAGNOSIS — IMO0002 Reserved for concepts with insufficient information to code with codable children: Secondary | ICD-10-CM

## 2012-02-26 DIAGNOSIS — H9190 Unspecified hearing loss, unspecified ear: Secondary | ICD-10-CM

## 2012-02-26 DIAGNOSIS — H8110 Benign paroxysmal vertigo, unspecified ear: Secondary | ICD-10-CM

## 2012-02-26 DIAGNOSIS — E119 Type 2 diabetes mellitus without complications: Secondary | ICD-10-CM

## 2012-02-26 LAB — COMPREHENSIVE METABOLIC PANEL
Alkaline Phosphatase: 68 U/L (ref 39–117)
Creatinine, Ser: 1 mg/dL (ref 0.4–1.2)
Glucose, Bld: 142 mg/dL — ABNORMAL HIGH (ref 70–99)
Sodium: 139 mEq/L (ref 135–145)
Total Bilirubin: 0.4 mg/dL (ref 0.3–1.2)
Total Protein: 7.7 g/dL (ref 6.0–8.3)

## 2012-02-26 LAB — CBC WITH DIFFERENTIAL/PLATELET
Basophils Relative: 0.4 % (ref 0.0–3.0)
Eosinophils Absolute: 0.3 10*3/uL (ref 0.0–0.7)
Eosinophils Relative: 3.1 % (ref 0.0–5.0)
Lymphocytes Relative: 38 % (ref 12.0–46.0)
Neutrophils Relative %: 51.5 % (ref 43.0–77.0)
RBC: 4.36 Mil/uL (ref 3.87–5.11)
WBC: 8.2 10*3/uL (ref 4.5–10.5)

## 2012-02-26 LAB — HEMOGLOBIN A1C: Hgb A1c MFr Bld: 7.3 % — ABNORMAL HIGH (ref 4.6–6.5)

## 2012-02-26 LAB — LIPID PANEL
HDL: 52.1 mg/dL (ref >39.00)
Total CHOL/HDL Ratio: 4
Triglycerides: 250 mg/dL — ABNORMAL HIGH (ref 0.0–149.0)

## 2012-02-26 NOTE — Assessment & Plan Note (Addendum)
Patient has no sensation to vibration and impaired sensation to pinprick in both of her feet. The left is more impaired than the right. Her last recorded HA1C in 2011 was 8 and her LDL was above goal at 189.  PLAN - today we will obtain HA1C, Lipid panel, and CMP to assess kidneys and adjust treatment pending lab results.

## 2012-02-26 NOTE — Assessment & Plan Note (Signed)
BP Readings from Last 3 Encounters:  02/26/12 180/84  04/13/11 158/72  03/27/11 134/62    PT's last two BPs in office were elevated above goal.  PLAN - Advise patient to take BP at home and send record to clinic so that medications and plan can be adjusted.

## 2012-02-26 NOTE — Assessment & Plan Note (Signed)
CBC    Component Value Date/Time   WBC 8.1 12/08/2010 1630   RBC 3.69* 12/08/2010 1630   HGB 11.0* 12/08/2010 1630   HCT 32.8* 12/08/2010 1630   PLT 197.0 12/08/2010 1630   MCV 88.9 12/08/2010 1630   MCH 28.6 11/09/2010 0510   MCHC 33.6 12/08/2010 1630   RDW 14.4 12/08/2010 1630   LYMPHSABS 2.7 12/08/2010 1630   MONOABS 0.6 12/08/2010 1630   EOSABS 0.6 12/08/2010 1630   BASOSABS 0.0 12/08/2010 1630   Patient's last CBC showed a low HGB in May 2012.  PLAN - Will repeat CBC today and follow up results in needed.

## 2012-02-26 NOTE — Progress Notes (Signed)
Subjective:     Patient ID: Andrea Wilkinson, female   DOB: 1931-12-02, 78 y.o.   MRN: [id number]  HPI Comments: Andrea Wilkinson is a 78 yo female with a history of HTN, DM2, and iron deficiency anemia ho has come to the clinic today for lab work and 1 week of ear pain. Her ear pain is in both ears, but more pronounced in the left ear. She states that the pain is worse at night. She reports tinnitus in her left ear with a feeling of pressure in both ears. She is also experiencing dizziness which she states feels like the room is spinning. She feels off balance when she stands up, but this resolves after 15 seconds. She has nausea but has not vomited. She reports that she has diminished hearing. She denies fever, chest pain, or SOB.  Andrea Wilkinson also wanted to have lab work for her diabetes. She has not been compliant with follow up for annual exams. Her last HA1C was 8 in 2011, and her last LDL was 189 in 2010. She reports that she has recently seen an opthalmologist to have cataracts removes in both eyes throughout July 2013. She states that she has some tingling in her feet and that she has swelling in her left leg and foot. She reports no changes in urination, urine quality or bowel movements. She reports that she drinks soda once a day and sweet tea with artificial sweetener. Her diet contains a fruits and vegetables with a small portion of meat at lunch and dinner with higher fiber foods such as oatmeal and cereal for breakfast. She reports that she keeps a record of her blood sugar at home, but has forgot to bring it today.  Past Medical History  Diagnosis Date  . GERD (gastroesophageal reflux disease)   . Hypertension   . Cervical spine arthritis   . DJD (degenerative joint disease)   . DM (diabetes mellitus), type 2   . Esophageal stricture    Past Surgical History  Procedure Date  . Abdominal hysterectomy   . Dilation and curettage of uterus   . Tubal ligation   . Left breast lumpectomy   .  Benign tumor removed from left hip   . Cholecystectomy   . Total knee arthroplasty     left Jan '12; right May '12 - by Dr. Charlann Boxer  . Cataract extraction, bilateral July 2013    Left done on 12/18/11 and right done on 01/15/2012   Family History  Problem Relation Age of Onset  . Diabetes Father    History   Social History  . Marital Status: Widowed    Spouse Name: N/A    Number of Children: N/A  . Years of Education: N/A   Occupational History  . Not on file.   Social History Main Topics  . Smoking status: Never Smoker   . Smokeless tobacco: Never Used  . Alcohol Use: No  . Drug Use: Not on file  . Sexually Active: Not on file   Other Topics Concern  . Not on file   Social History Narrative   Married [phone] sons- '56, '57, '59, '62, '70Grandchildren 10, 6 great grandchildrenLives in own home, son lives with her. I- ADLsDiscussed end of life care: would not want resuscitation or being maintained with mechanical ventilation.Daily caffeine use 1 cup of coffee per day    Current Outpatient Prescriptions on File Prior to Visit  Medication Sig Dispense Refill  . benzonatate (TESSALON) 100 MG capsule  Take 100 mg by mouth 3 (three) times daily as needed.        . Calcium Carbonate-Vitamin D (CALTRATE 600+D) 600-400 MG-UNIT per tablet Take 1 tablet by mouth daily.  90 tablet  3  . cyclobenzaprine (FLEXERIL) 5 MG tablet Take 1 tablet (5 mg total) by mouth 3 (three) times daily as needed.  90 tablet  3  . enalapril (VASOTEC) 20 MG tablet Take 1 tablet (20 mg total) by mouth daily.  90 tablet  3  . furosemide (LASIX) 20 MG tablet Take 1 tablet (20 mg total) by mouth daily.  90 tablet  3  . indomethacin (INDOCIN) 25 MG capsule Take 1 capsule (25 mg total) by mouth every 6 (six) hours as needed.  90 capsule  3  . meloxicam (MOBIC) 15 MG tablet Take 1 tablet (15 mg total) by mouth daily.  90 tablet  3  . metFORMIN (GLUCOPHAGE) 1000 MG tablet Take 1 tablet (1,000 mg total) by mouth 2  (two) times daily with a meal.  180 tablet  3  . omeprazole (PRILOSEC) 20 MG capsule Take 1 capsule (20 mg total) by mouth daily.  90 capsule  3  . oxybutynin (DITROPAN-XL) 5 MG 24 hr tablet Take 5 mg by mouth daily.      . traMADol (ULTRAM) 50 MG tablet Take 1 tablet (50 mg total) by mouth every 6 (six) hours as needed.  120 tablet  5  . belladonna-PHENObarbital (DONNATAL) 16.2 MG/5ML ELIX Take 5 mLs (16.2 mg total) by mouth daily. Please fill for 90day supply and 3 r/f  600 mL  3     Review of Systems  All other systems reviewed and are negative.       Objective:   Physical Exam  Constitutional: No distress.       Obese.  HENT:  Head: Normocephalic.  Right Ear: External ear and ear canal normal. No lacerations. No drainage or swelling. No foreign bodies. Tympanic membrane mobility is abnormal. No hemotympanum.  Left Ear: External ear and ear canal normal. No lacerations. No drainage or swelling. No foreign bodies. Tympanic membrane mobility is abnormal. No hemotympanum. Decreased hearing is noted.  Nose: Nose normal.  Mouth/Throat: Oropharynx is clear and moist and mucous membranes are normal. She has dentures. No oral lesions. No oropharyngeal exudate or tonsillar abscesses.       Both TM showed decreased mobility. Ear canals clear of wax and debris. No signs of external ear infection.  Eyes: Pupils are equal, round, and reactive to light. Right eye exhibits no discharge. Left eye exhibits no discharge. No scleral icterus.  Neck: Normal range of motion. Neck supple. No thyromegaly present.  Cardiovascular: Normal rate, regular rhythm and normal heart sounds.  Exam reveals no gallop and no friction rub.   No murmur heard.      2+ carotid pulses. 2+ radial pulses. 1+ pedal pulses  Pulmonary/Chest: Effort normal and breath sounds normal. No respiratory distress. She has no wheezes. She has no rales.  Abdominal: Soft. Bowel sounds are normal. She exhibits no distension. There is no  tenderness.  Lymphadenopathy:    She has cervical adenopathy (Left post-auricular lymphnode swelling).  Neurological: She is alert.       Romberg shows loss of balance. No supinator drift. Able to walk with tandem gait. PT seemed unsteady why getting onto the examining table. Heel-to-shin testing normal. Rapid alternating movement impaired due to pain from arthritis.  Impaired hearing in left hear. Marena Chancy test localizes  to right.  No facial droop observed. No tongue or pallet deviation.  Loss of sensation to vibration in both feet. Impaired sensation to pinprick in both feet more pronounced on left.  Skin: No rash noted. She is not diaphoretic. No erythema. No pallor.       Foot exam showed no ulcers or lesions on feet. Feet were cold to touch.  Psychiatric: She has a normal mood and affect. Her behavior is normal. Judgment and thought content normal.       Assessment & Plan:     1. Bilateral Ear Pain - Likely from Eustachian tube dysfunction. Given history of pressure and pain in ears with no discharge or fluid in inner ear. Physical exam showed loss of motion of TM which makes this Dx more likely.  PLAN - Recommend OTC pseudophedrin   2. Carotid Dysautonomia - Patient's vertigo is intermittent and resolves in less than 20 seconds. She showed no neurological dysfunction on exam.  PLAN - Advise patient to be aware of vertigo when she stands up and wait 20 seconds before  walking after standing.  Attending note: patient interviewed and examined. She presents for follow with last visit more than 12 months ago and no recent monitoring lab work. Her cc: brief period of poor balance with change in position - sitting to standing- lasting 15-20 seconds. No other issues of balance or vertigo. She c/o pressure in her ears, running water tinnitus, some discomfort left ear.  Exam: Cor - RRR, no carotid bruits  Neuro - A&O x 3, speech clear, cognition grossly normal; CN II-XII normal except for mild  hearing loss AD, Marena Chancy lateralizes right; MS 5/5; cerebellar - mild balance problem with standing at station, no pronator drift, slow gait, mild trouble with tandem gait, normal F-N maneuver, normal heel-shin, normal rapid finger movement, no dysdiadochokinesia.    Agree with assessment and plan per Mr. Kai Levins, MSIII - see problem oriented charting.

## 2012-02-26 NOTE — Patient Instructions (Addendum)
Hearing loss - you have decreased hearing more on the right than the left. Plan - you need hearing testing and we will refer you to an audiologist for testing.  Eustachian tube dysfunction - there is increased pressure in your middle ear which can also affect your hearing.  Plan -  Sudafed (generic) 30 mg twice a day to relieve the pressure.  Positional vertigo (carotid dysautonomia) - this is very common and is not a serious problem except you must be aware of the dizziness so you do not fall. Plan  "rule of 20,"  =  When you first stand up you need to count to 20 before you start to walk or move. This will give enough time for the dizziness to pass  Diabetes - will check lab today with recommendations to follow.  Cholesterol - due to the diabetes we will check your cholesterol to be sure it is ok.  Anemia - history of low blood and low iron.  Plan - lab today with recommendations to follow.   Blood pressure: BP Readings from Last 3 Encounters:  02/26/12 180/84  04/13/11 158/72  03/27/11 134/62   Blood pressure has been high the last two office visits. Plan Continue your present medications, including the lasix every day.  Check you BP at home twice a day for the next week and send me the numbers so we can make adjustments in your medications as needed.     Benign Positional Vertigo Vertigo means you feel like you or your surroundings are moving when they are not. Benign positional vertigo is the most common form of vertigo. Benign means that the cause of your condition is not serious. Benign positional vertigo is more common in older adults. CAUSES   Benign positional vertigo is the result of an upset in the labyrinth system. This is an area in the middle ear that helps control your balance. This may be caused by a viral infection, head injury, or repetitive motion. However, often no specific cause is found. SYMPTOMS   Symptoms of benign positional vertigo occur when you move your  head or eyes in different directions. Some of the symptoms may include:  Loss of balance and falls.   Vomiting.   Blurred vision.   Dizziness.   Nausea.   Involuntary eye movements (nystagmus).  DIAGNOSIS   Benign positional vertigo is usually diagnosed by physical exam. If the specific cause of your benign positional vertigo is unknown, your caregiver may perform imaging tests, such as magnetic resonance imaging (MRI) or computed tomography (CT). TREATMENT   Your caregiver may recommend movements or procedures to correct the benign positional vertigo. Medicines such as meclizine, benzodiazepines, and medicines for nausea may be used to treat your symptoms. In rare cases, if your symptoms are caused by certain conditions that affect the inner ear, you may need surgery. HOME CARE INSTRUCTIONS    Follow your caregiver's instructions.   Move slowly. Do not make sudden body or head movements.   Avoid driving.   Avoid operating heavy machinery.   Avoid performing any tasks that would be dangerous to you or others during a vertigo episode.   Drink enough fluids to keep your urine clear or pale yellow.  SEEK IMMEDIATE MEDICAL CARE IF:    You develop problems with walking, weakness, numbness, or using your arms, hands, or legs.   You have difficulty speaking.   You develop severe headaches.   Your nausea or vomiting continues or gets worse.  You develop visual changes.   Your family or friends notice any behavioral changes.   Your condition gets worse.   You have a fever.   You develop a stiff neck or sensitivity to light.  MAKE SURE YOU:    Understand these instructions.   Will watch your condition.   Will get help right away if you are not doing well or get worse.  Document Released: 03/13/2006 Document Revised: 05/25/2011 Document Reviewed: 02/23/2011 [facility] Patient Information 2012 [place], [place].   Barotitis Media Barotitis media is soreness  (inflammation) of the area behind the eardrum (middle ear). This occurs when the auditory tube (Eustachian tube) leading from the back of the throat to the eardrum is blocked. When it is blocked air cannot move in and out of the middle ear to equalize pressure changes. These pressure changes come from changes in altitude when:  Flying.   Driving in the mountains.   Diving.  Problems are more likely to occur with pressure changes during times when you are congested as from:  Hay fever.   Upper respiratory infection.   A cold.  Damage or hearing loss (barotrauma) caused by this may be permanent. HOME CARE INSTRUCTIONS    Use medicines as recommended by your caregiver. Over the counter medicines will help unblock the canal and can help during times of air travel.   Do not put anything into your ears to clean or unplug them. Eardrops will not be helpful.   Do not swim, dive, or fly until your caregiver says it is all right to do so. If these activities are necessary, chewing gum with frequent swallowing may help. It is also helpful to hold your nose and gently blow to pop your ears for equalizing pressure changes. This forces air into the Eustachian tube.   For little ones with problems, give your baby a bottle of water or juice during periods when pressure changes would be anticipated such as during take offs and landings associated with air travel.   Only take over-the-counter or prescription medicines for pain, discomfort, or fever as directed by your caregiver.   A decongestant may be helpful in de-congesting the middle ear and make pressure equalization easier. This can be even more effective if the drops (spray) are delivered with the head lying over the edge of a bed with the head tilted toward the ear on the affected side.   If your caregiver has given you a follow-up appointment, it is very important to keep that appointment. Not keeping the appointment could result in a chronic or  permanent injury, pain, hearing loss and disability. If there is any problem keeping the appointment, you must call back to this facility for assistance.  SEEK IMMEDIATE MEDICAL CARE IF:    You develop a severe headache, dizziness, severe ear pain, or bloody or pus-like drainage from your ears.   An oral temperature above 102 F (38.9 C) develops.   Your problems do not improve or become worse.  MAKE SURE YOU:    Understand these instructions.   Will watch your condition.   Will get help right away if you are not doing well or get worse.  Document Released: 06/02/2000 Document Revised: 05/25/2011 Document Reviewed: 01/09/2008 [facility] Patient Information 2012 [place], [place].

## 2012-02-27 DIAGNOSIS — H9190 Unspecified hearing loss, unspecified ear: Secondary | ICD-10-CM | POA: Insufficient documentation

## 2012-02-27 DIAGNOSIS — IMO0002 Reserved for concepts with insufficient information to code with codable children: Secondary | ICD-10-CM | POA: Insufficient documentation

## 2012-02-27 LAB — LDL CHOLESTEROL, DIRECT: Direct LDL: 116.1 mg/dL

## 2012-02-27 NOTE — Assessment & Plan Note (Signed)
By history and exam her symptoms are c/w positional vertigo.  Plan - "rule of 20"

## 2012-02-27 NOTE — Assessment & Plan Note (Signed)
By patient complaint and decreased hearing to tuning fork, postive [employer]- audiology evaluation

## 2012-02-28 ENCOUNTER — Telehealth: Payer: Self-pay | Admitting: *Deleted

## 2012-02-28 NOTE — Telephone Encounter (Signed)
Message copied by Elnora Morrison on Wed Feb 28, 2012  9:47 AM ------      Message from: Jacques Navy      Created: Tue Feb 27, 2012  4:21 AM       Please check to see if patient went to lab 02/26/12 as instructed. If not ( no results came up) please call her to come in.

## 2012-02-28 NOTE — Telephone Encounter (Signed)
LEFT MESSAGE FOR PATIENT TO RETURN CALL ABOUT HER LAB TEST TO BE DONE. 02/28/2012 9;51

## 2012-03-01 ENCOUNTER — Encounter: Payer: Self-pay | Admitting: Internal Medicine

## 2012-03-02 ENCOUNTER — Emergency Department (HOSPITAL_COMMUNITY)
Admission: EM | Admit: 2012-03-02 | Discharge: 2012-03-02 | Disposition: A | Discharge status: Home / Self Care | Payer: Medicare Other | Attending: Emergency Medicine | Admitting: Emergency Medicine

## 2012-03-02 ENCOUNTER — Emergency Department (HOSPITAL_COMMUNITY): Payer: Medicare Other

## 2012-03-02 ENCOUNTER — Encounter (HOSPITAL_COMMUNITY): Payer: Self-pay | Admitting: Family Medicine

## 2012-03-02 DIAGNOSIS — K219 Gastro-esophageal reflux disease without esophagitis: Secondary | ICD-10-CM | POA: Insufficient documentation

## 2012-03-02 DIAGNOSIS — M169 Osteoarthritis of hip, unspecified: Secondary | ICD-10-CM

## 2012-03-02 DIAGNOSIS — I10 Essential (primary) hypertension: Secondary | ICD-10-CM | POA: Insufficient documentation

## 2012-03-02 DIAGNOSIS — E119 Type 2 diabetes mellitus without complications: Secondary | ICD-10-CM | POA: Insufficient documentation

## 2012-03-02 DIAGNOSIS — Z886 Allergy status to analgesic agent status: Secondary | ICD-10-CM | POA: Insufficient documentation

## 2012-03-02 DIAGNOSIS — M1610 Unilateral primary osteoarthritis, unspecified hip: Secondary | ICD-10-CM | POA: Insufficient documentation

## 2012-03-02 MED ORDER — OXYCODONE-ACETAMINOPHEN 5-325 MG PO TABS
1.0000 | ORAL_TABLET | Freq: Once | ORAL | Status: AC
  Administered 2012-03-02: 1 via ORAL
  Filled 2012-03-02: qty 1

## 2012-03-02 MED ORDER — HYDROCODONE-ACETAMINOPHEN 5-500 MG PO TABS: 1.0000 | ORAL_TABLET | Freq: Four times a day (QID) | ORAL | Status: AC | PRN

## 2012-03-02 NOTE — ED Provider Notes (Signed)
History  Scribed for Andrea Chick, MD, the patient was seen in room TR08C/TR08C. This chart was scribed by Candelaria Stagers. The patient's care started at 11:43 AM    CSN: [id number]  Arrival date & time 03/02/12  4098   First MD Initiated Contact with Patient 03/02/12 1133      Chief Complaint  Patient presents with  . Hip Pain     The history is provided by the patient. No language interpreter was used.   Andrea Wilkinson is a 78 y.o. female who presents to the Emergency Department complaining of chronic left hip pain that has gotten worse over the last few days.  Pt has h/o arthritis to this hip.  She denies any recent fall or injury.  Pt has called her PCP who cannot see her until Monday.  She denies fever or swelling in the legs.  Nothing seems to make the pain better or worse. She is able to ambulate.  No weakness of legs, denies back pain.  No fever/chills.  States she has had similar symptoms in the past.   Past Medical History  Diagnosis Date  . GERD (gastroesophageal reflux disease)   . Hypertension   . Cervical spine arthritis   . DJD (degenerative joint disease)   . DM (diabetes mellitus), type 2   . Esophageal stricture     Past Surgical History  Procedure Date  . Abdominal hysterectomy   . Dilation and curettage of uterus   . Tubal ligation   . Left breast lumpectomy   . Benign tumor removed from left hip   . Cholecystectomy   . Total knee arthroplasty     left Jan '12; right May '12 - by Dr. Charlann Boxer  . Cataract extraction, bilateral July 2013    Left done on 12/18/11 and right done on 01/15/2012    Family History  Problem Relation Age of Onset  . Diabetes Father     History  Substance Use Topics  . Smoking status: Never Smoker   . Smokeless tobacco: Never Used  . Alcohol Use: No    OB History    Grav Para Term Preterm Abortions TAB SAB Ect Mult Living                  Review of Systems  Constitutional: Negative for fever.  Cardiovascular:  Negative for leg swelling.  Musculoskeletal: Positive for arthralgias (left hip pain ). Negative for back pain.  All other systems reviewed and are negative.    Allergies  Aspirin and Codeine  Home Medications   Current Outpatient Rx  Name Route Sig Dispense Refill  . CALCIUM CARBONATE-VITAMIN D 600-400 MG-UNIT PO TABS Oral Take 1 tablet by mouth daily.    . CYCLOBENZAPRINE HCL 5 MG PO TABS Oral Take 5 mg by mouth 3 (three) times daily as needed. Muscle spasms    . ENALAPRIL MALEATE 20 MG PO TABS Oral Take 20 mg by mouth daily.    . FUROSEMIDE 20 MG PO TABS Oral Take 20 mg by mouth daily.    . INDOMETHACIN 25 MG PO CAPS Oral Take 25 mg by mouth every 6 (six) hours as needed. arthritis    . MELOXICAM 15 MG PO TABS Oral Take 15 mg by mouth daily.    Marland Kitchen METFORMIN HCL 1000 MG PO TABS Oral Take 1,000 mg by mouth 2 (two) times daily with a meal.    . OMEPRAZOLE 20 MG PO CPDR Oral Take 20 mg by mouth  daily.    . OXYBUTYNIN CHLORIDE ER 5 MG PO TB24 Oral Take 5 mg by mouth daily.    . TRAMADOL HCL 50 MG PO TABS Oral Take 50 mg by mouth every 6 (six) hours as needed. For pain    . HYDROCODONE-ACETAMINOPHEN 5-500 MG PO TABS Oral Take 1-2 tablets by mouth every 6 (six) hours as needed for pain. 15 tablet 0    BP 166/70  Pulse 79  Temp 98 F (36.7 C)  Resp 18  SpO2 96%  Physical Exam  Nursing note and vitals reviewed. Constitutional: She is oriented to person, place, and time. She appears well-developed and well-nourished. No distress.  HENT:  Head: Normocephalic and atraumatic.  Eyes: EOM are normal. Pupils are equal, round, and reactive to light.  Neck: Neck supple. No tracheal deviation present.  Cardiovascular: Normal rate.   Pulmonary/Chest: Effort normal. No respiratory distress. She has no wheezes.  Musculoskeletal: She exhibits no edema.       No pain with ROM of left hip.  No midline spinal tenderness.    Neurological: She is alert and oriented to person, place, and time.    Skin: Skin is warm and dry.  Psychiatric: She has a normal mood and affect. Her behavior is normal.    ED Course  Procedures   DIAGNOSTIC STUDIES: Oxygen Saturation is 96% on room air, normal by my interpretation.    COORDINATION OF CARE:  11:48 Ordered: DG Hip Complete Left   Labs Reviewed - No data to display Dg Hip Complete Left  03/02/2012  *RADIOLOGY REPORT*  Clinical Data: Left hip pain.  LEFT HIP - COMPLETE 2+ VIEW  Comparison: Left hip x-rays 05/22/2007.  Findings: No evidence of acute or subacute fracture or dislocation. Moderate joint space narrowing and associated hypertrophic spurring involving the femoral head.  Enthesopathy at the insertion of the gluteal tendons on the greater trochanter.  Included AP pelvis shows severe joint space narrowing in the contralateral right hip.  Enthesopathic spurring is noted at multiple musculotendinous insertions sites on the iliac spines and the ischia bilaterally and the right greater trochanter. Sacroiliac joints and symphysis pubis intact.  Degenerative changes involving the visualized lumbar spine.  IMPRESSION: No acute or subacute osseous abnormality.  Moderate osteoarthritis in the left hip.  Severe osteoarthritis in the right hip.   Original Report Authenticated By: Arnell Sieving, M.D.      1. Osteoarthritis of hip       MDM  Pt presents with c/o left hip pain.  No trauma.  Xray is c/w osteoarthritis of hip.  Pt given pain medication.  No systemic symptoms.  Pt is overall nontoxic and well hydrated in appearance.  Discharged with strict return precautions.  Pt agreeable with plan.  I personally performed the services described in this documentation, which was scribed in my presence. The recorded information has been reviewed and considered.        Andrea Chick, MD 03/03/12 [phone]

## 2012-03-02 NOTE — ED Notes (Signed)
Pt complaining of left hip pain due to her arthritis for a few days.

## 2012-03-05 ENCOUNTER — Telehealth: Payer: Self-pay | Admitting: *Deleted

## 2012-03-05 NOTE — Telephone Encounter (Signed)
Patient is calling back to return Sue's call, please call patient back at [phone]

## 2012-03-05 NOTE — Telephone Encounter (Signed)
Message copied by Elnora Morrison on Tue Mar 05, 2012  1:10 PM ------      Message from: Jacques Navy      Created: Tue Feb 27, 2012  4:21 AM       Please check to see if patient went to lab 02/26/12 as instructed. If not ( no results came up) please call her to come in.

## 2012-03-19 ENCOUNTER — Ambulatory Visit: Payer: Medicare Other | Attending: Internal Medicine | Admitting: Audiology

## 2012-03-21 ENCOUNTER — Telehealth: Payer: Self-pay | Admitting: Internal Medicine

## 2012-03-21 NOTE — Telephone Encounter (Signed)
Forward 4 pages from [facility] to Dr. Illene Regulus for review on 03-21-12 ym

## 2012-03-26 ENCOUNTER — Other Ambulatory Visit: Payer: Self-pay | Admitting: Internal Medicine

## 2012-03-26 MED ORDER — GLUCOSE BLOOD VI STRP: ORAL_STRIP | Status: DC

## 2012-03-26 NOTE — Telephone Encounter (Signed)
Rx FOR DIABETIC SUPPLIES AND LANCETS FAXED TO [facility]

## 2012-03-26 NOTE — Telephone Encounter (Signed)
The patient called the triage line and is hoping to get her diabetic testing supplies sent refilled.

## 2012-03-27 ENCOUNTER — Telehealth: Payer: Self-pay | Admitting: Internal Medicine

## 2012-03-27 NOTE — Telephone Encounter (Signed)
Caller: [name]/Patient; Patient Name: Andrea Wilkinson; PCP: Illene Regulus (Adults only); Best Callback Phone Number: [phone]. Patient states she used to get her Diabetic supplies  through [facility] mail order delivery.  She states they went out of business and is now out of her Test Strips.  She has been without Diabetic touch strips a "month or two" . She is currently using  Meter-  Accu-check. Strips- Accu-Check Compact Bottle with 51 test strips.  The test strips fit in the Accu check meter.  She Uses [facility] in [place] [phone].  she cannot tell if sugar is too high or low.  She states earlier she was Weak, dizzy with standing, not thirsty, normal vision, no n/v, Afebrile.   no abdominal pain, Eating and drinking.  She states she is compliant with medication. She refuses ambulance, ER for GBS evaluation. Emergent s/sx ruled out per Diabetes : Control Problems with the exception, "New or increasing symptoms or glucose not within the provider defined guidelines and not taking medication /following treatment plan".  See Provider in 4 hours.  Patient declines. she is going to call a neighbor to come check her GBS .   PATIENT NEEDS TEST STRIPS . HAS NOT HAD THEM FOR MONTHS. PLEASE CONTACT PATIENT FOR ASSISTANCE WITH MEDICATION .

## 2012-03-27 NOTE — Telephone Encounter (Signed)
Patient notified of Rx for lancets and test strips sent to [facility] on [address]. Patient will pick up tomorrow.

## 2012-05-09 ENCOUNTER — Ambulatory Visit (INDEPENDENT_AMBULATORY_CARE_PROVIDER_SITE_OTHER): Payer: Medicare Other | Admitting: Ophthalmology

## 2012-05-10 ENCOUNTER — Ambulatory Visit (INDEPENDENT_AMBULATORY_CARE_PROVIDER_SITE_OTHER): Payer: Medicare Other | Admitting: Ophthalmology

## 2012-05-10 DIAGNOSIS — E1165 Type 2 diabetes mellitus with hyperglycemia: Secondary | ICD-10-CM

## 2012-05-10 DIAGNOSIS — H35039 Hypertensive retinopathy, unspecified eye: Secondary | ICD-10-CM

## 2012-05-10 DIAGNOSIS — H3530 Unspecified macular degeneration: Secondary | ICD-10-CM

## 2012-05-10 DIAGNOSIS — I10 Essential (primary) hypertension: Secondary | ICD-10-CM

## 2012-05-10 DIAGNOSIS — H43819 Vitreous degeneration, unspecified eye: Secondary | ICD-10-CM

## 2012-05-10 DIAGNOSIS — E11319 Type 2 diabetes mellitus with unspecified diabetic retinopathy without macular edema: Secondary | ICD-10-CM

## 2012-05-22 ENCOUNTER — Other Ambulatory Visit: Payer: Self-pay | Admitting: *Deleted

## 2012-05-22 MED ORDER — OMEPRAZOLE 20 MG PO CPDR: 20.0000 mg | DELAYED_RELEASE_CAPSULE | Freq: Every day | ORAL | Status: DC

## 2012-05-30 ENCOUNTER — Other Ambulatory Visit: Payer: Self-pay | Admitting: *Deleted

## 2012-05-30 MED ORDER — OMEPRAZOLE 20 MG PO CPDR: 20.0000 mg | DELAYED_RELEASE_CAPSULE | Freq: Every day | ORAL | Status: DC

## 2012-05-30 MED ORDER — ENALAPRIL MALEATE 20 MG PO TABS: 20.0000 mg | ORAL_TABLET | Freq: Every day | ORAL | Status: DC

## 2012-06-04 ENCOUNTER — Other Ambulatory Visit: Payer: Self-pay | Admitting: *Deleted

## 2012-06-04 MED ORDER — OMEPRAZOLE 20 MG PO CPDR: 20.0000 mg | DELAYED_RELEASE_CAPSULE | Freq: Every day | ORAL | Status: DC

## 2012-06-04 MED ORDER — ENALAPRIL MALEATE 20 MG PO TABS: 20.0000 mg | ORAL_TABLET | Freq: Every day | ORAL | Status: DC

## 2012-06-04 NOTE — Telephone Encounter (Signed)
MEDICATION REFILL SENT TO [facility]. PATIENT CALLED REQUEST THEY GO TO [facility] , MAIL ORDER.

## 2012-06-13 ENCOUNTER — Telehealth: Payer: Self-pay | Admitting: Internal Medicine

## 2012-06-13 NOTE — Telephone Encounter (Signed)
Patient left message on triage about having two medications refilled, left message for patient to verify which medications she needed called in and to which pharmacy

## 2012-06-25 ENCOUNTER — Other Ambulatory Visit: Payer: Self-pay | Admitting: *Deleted

## 2012-06-25 MED ORDER — METFORMIN HCL 1000 MG PO TABS: 1000.0000 mg | ORAL_TABLET | Freq: Two times a day (BID) | ORAL | Status: DC

## 2012-06-25 MED ORDER — FUROSEMIDE 20 MG PO TABS: 20.0000 mg | ORAL_TABLET | Freq: Every day | ORAL | Status: DC

## 2012-06-25 MED ORDER — MELOXICAM 15 MG PO TABS: 15.0000 mg | ORAL_TABLET | Freq: Every day | ORAL | Status: DC

## 2012-09-30 ENCOUNTER — Other Ambulatory Visit: Payer: Self-pay | Admitting: Internal Medicine

## 2012-10-29 ENCOUNTER — Other Ambulatory Visit: Payer: Self-pay | Admitting: Internal Medicine

## 2012-10-29 ENCOUNTER — Ambulatory Visit (INDEPENDENT_AMBULATORY_CARE_PROVIDER_SITE_OTHER): Payer: Medicare Other | Admitting: Internal Medicine

## 2012-10-29 ENCOUNTER — Other Ambulatory Visit (INDEPENDENT_AMBULATORY_CARE_PROVIDER_SITE_OTHER): Payer: Medicare Other

## 2012-10-29 ENCOUNTER — Encounter: Payer: Self-pay | Admitting: Internal Medicine

## 2012-10-29 VITALS — BP 152/82 | HR 80 | Temp 98.2°F | Ht 60.0 in | Wt 161.0 lb

## 2012-10-29 DIAGNOSIS — IMO0001 Reserved for inherently not codable concepts without codable children: Secondary | ICD-10-CM

## 2012-10-29 DIAGNOSIS — E119 Type 2 diabetes mellitus without complications: Secondary | ICD-10-CM

## 2012-10-29 DIAGNOSIS — I10 Essential (primary) hypertension: Secondary | ICD-10-CM

## 2012-10-29 DIAGNOSIS — M1990 Unspecified osteoarthritis, unspecified site: Secondary | ICD-10-CM

## 2012-10-29 LAB — BASIC METABOLIC PANEL
BUN: 17 mg/dL (ref 6–23)
Calcium: 9.9 mg/dL (ref 8.4–10.5)
Creatinine, Ser: 0.9 mg/dL (ref 0.4–1.2)
GFR: 61.8 mL/min (ref >60.00)
Glucose, Bld: 205 mg/dL — ABNORMAL HIGH (ref 70–99)
Potassium: 4.2 mEq/L (ref 3.5–5.1)

## 2012-10-29 LAB — HEPATIC FUNCTION PANEL
Albumin: 4.2 g/dL (ref 3.5–5.2)
Total Bilirubin: 0.3 mg/dL (ref 0.3–1.2)

## 2012-10-29 LAB — LIPID PANEL
HDL: 48.6 mg/dL (ref >39.00)
Triglycerides: 339 mg/dL — ABNORMAL HIGH (ref 0.0–149.0)
VLDL: 67.8 mg/dL — ABNORMAL HIGH (ref 0.0–40.0)

## 2012-10-29 LAB — LDL CHOLESTEROL, DIRECT: Direct LDL: 101.7 mg/dL

## 2012-10-29 LAB — HEMOGLOBIN A1C: Hgb A1c MFr Bld: 8.4 % — ABNORMAL HIGH (ref 4.6–6.5)

## 2012-10-29 MED ORDER — BELLADONNA ALK-PHENOBARBITAL 48 MG PO TBCR: 1.0000 | EXTENDED_RELEASE_TABLET | Freq: Every day | ORAL | Status: DC | PRN

## 2012-10-29 MED ORDER — CYCLOBENZAPRINE HCL 5 MG PO TABS: ORAL_TABLET | ORAL | Status: DC

## 2012-10-29 MED ORDER — MELOXICAM 15 MG PO TABS: 15.0000 mg | ORAL_TABLET | Freq: Every day | ORAL | Status: DC

## 2012-10-29 MED ORDER — GLUCOSE BLOOD VI STRP: ORAL_STRIP | Status: DC

## 2012-10-29 MED ORDER — FUROSEMIDE 20 MG PO TABS: 20.0000 mg | ORAL_TABLET | Freq: Every day | ORAL | Status: DC

## 2012-10-29 MED ORDER — OMEPRAZOLE 20 MG PO CPDR: 20.0000 mg | DELAYED_RELEASE_CAPSULE | Freq: Every day | ORAL | Status: DC

## 2012-10-29 MED ORDER — METFORMIN HCL 1000 MG PO TABS: 1000.0000 mg | ORAL_TABLET | Freq: Two times a day (BID) | ORAL | Status: DC

## 2012-10-29 MED ORDER — ENALAPRIL MALEATE 20 MG PO TABS: 20.0000 mg | ORAL_TABLET | Freq: Every day | ORAL | Status: DC

## 2012-10-29 MED ORDER — OXYBUTYNIN CHLORIDE ER 5 MG PO TB24: 5.0000 mg | ORAL_TABLET | Freq: Every day | ORAL | Status: DC

## 2012-10-29 MED ORDER — GLIPIZIDE ER 2.5 MG PO TB24: 2.5000 mg | ORAL_TABLET | Freq: Every day | ORAL | Status: DC

## 2012-10-29 NOTE — Patient Instructions (Addendum)
Please continue all other medications as before, and refills have been done if requested. Please have the pharmacy call with any other refills you may need. Please continue your efforts at being more active, low cholesterol diabetic diet, and weight control. Please go to the LAB in the Basement (turn left off the elevator) for the tests to be done today You will be contacted by phone if any changes need to be made immediately.  Otherwise, you will receive a letter about your results with an explanation Please remember to sign up for My Chart if you have not done so, as this will be important to you in the future with finding out test results, communicating by private email, and scheduling acute appointments online when needed. Please return in 6 months, or sooner if needed, to Dr Debby Bud

## 2012-10-29 NOTE — Assessment & Plan Note (Signed)
Stable, for nsaid med refill,  to f/u any worsening symptoms or concerns

## 2012-10-29 NOTE — Progress Notes (Signed)
Subjective:    Patient ID: Andrea Wilkinson, female    DOB: [birth year], 79 y.o.   MRN: [id number]  HPI  Here to f/u; overall doing ok,  Pt denies chest pain, increased sob or doe, wheezing, orthopnea, PND, increased LE swelling, palpitations, dizziness or syncope.  Pt denies polydipsia, polyuria, or low sugar symptoms such as weakness or confusion improved with po intake.  Pt denies new neurological symptoms such as new headache, or facial or extremity weakness or numbness.   Pt states overall good compliance with meds, has been trying to follow lower cholesterol, diabetic diet, with wt overall stable,  but little exercise however.  Needs med refills for DJD pain, prior working well Past Medical History  Diagnosis Date  . GERD (gastroesophageal reflux disease)   . Hypertension   . Cervical spine arthritis   . DJD (degenerative joint disease)   . DM (diabetes mellitus), type 2   . Esophageal stricture    Past Surgical History  Procedure Laterality Date  . Abdominal hysterectomy    . Dilation and curettage of uterus    . Tubal ligation    . Left breast lumpectomy    . Benign tumor removed from left hip    . Cholecystectomy    . Total knee arthroplasty      left Jan '12; right May '12 - by Dr. Charlann Boxer  . Cataract extraction, bilateral  July 2013    Left done on 12/18/11 and right done on 01/15/2012    reports that she has never smoked. She has never used smokeless tobacco. She reports that she does not drink alcohol. Her drug history is not on file. family history includes Diabetes in her father. Allergies  Allergen Reactions  . Aspirin     REACTION: rash  . Codeine     REACTION: nausea and vomiting   Current Outpatient Prescriptions on File Prior to Visit  Medication Sig Dispense Refill  . Calcium Carbonate-Vitamin D 600-400 MG-UNIT per tablet Take 1 tablet by mouth daily.      . indomethacin (INDOCIN) 25 MG capsule Take 25 mg by mouth every 6 (six) hours as needed. arthritis      .  traMADol (ULTRAM) 50 MG tablet Take 50 mg by mouth every 6 (six) hours as needed. For pain      . [DISCONTINUED] oxybutynin (DITROPAN-XL) 5 MG 24 hr tablet Take 5 mg by mouth daily.       No current facility-administered medications on file prior to visit.   Review of Systems  Constitutional: Negative for unexpected weight change, or unusual diaphoresis  HENT: Negative for tinnitus.   Eyes: Negative for photophobia and visual disturbance.  Respiratory: Negative for choking and stridor.   Gastrointestinal: Negative for vomiting and blood in stool.  Genitourinary: Negative for hematuria and decreased urine volume.  Musculoskeletal: Negative for acute joint swelling Skin: Negative for color change and wound.  Neurological: Negative for tremors and numbness other than noted  Psychiatric/Behavioral: Negative for decreased concentration or  hyperactivity.       Objective:   Physical Exam BP 152/82  Pulse 80  Temp(Src) 98.2 F (36.8 C) (Oral)  Ht 5' (1.524 m)  Wt 161 lb (73.029 kg)  BMI 31.44 kg/m2  SpO2 97% VS noted,  Constitutional: Pt appears well-developed and well-nourished.  HENT: Head: NCAT.  Right Ear: External ear normal.  Left Ear: External ear normal.  Eyes: Conjunctivae and EOM are normal. Pupils are equal, round, and reactive to light.  Neck: Normal range of motion. Neck supple.  Cardiovascular: Normal rate and regular rhythm.   Pulmonary/Chest: Effort normal and breath sounds normal.  Joint:  No synovitis Neurological: Pt is alert. Not confused  Skin: Skin is warm. No erythema.  Psychiatric: Pt behavior is normal. Thought content normal.     Assessment & Plan:

## 2012-10-29 NOTE — Assessment & Plan Note (Signed)
stable overall by history and exam,  and pt to continue medical treatment as before,  to f/u any worsening symptoms or concerns, for lab f/u today

## 2012-10-29 NOTE — Assessment & Plan Note (Signed)
stable overall by history and exam, recent data reviewed with pt, and pt to continue medical treatment as before,  to f/u any worsening symptoms or concerns BP Readings from Last 3 Encounters:  10/29/12 152/82  03/02/12 166/70  02/26/12 180/84

## 2012-11-07 ENCOUNTER — Ambulatory Visit (INDEPENDENT_AMBULATORY_CARE_PROVIDER_SITE_OTHER): Payer: Medicare Other | Admitting: Ophthalmology

## 2012-11-14 ENCOUNTER — Ambulatory Visit (INDEPENDENT_AMBULATORY_CARE_PROVIDER_SITE_OTHER): Payer: Medicare Other | Admitting: Ophthalmology

## 2012-11-14 DIAGNOSIS — E1139 Type 2 diabetes mellitus with other diabetic ophthalmic complication: Secondary | ICD-10-CM

## 2012-11-14 DIAGNOSIS — H3530 Unspecified macular degeneration: Secondary | ICD-10-CM

## 2012-11-14 DIAGNOSIS — H35039 Hypertensive retinopathy, unspecified eye: Secondary | ICD-10-CM

## 2012-11-14 DIAGNOSIS — I10 Essential (primary) hypertension: Secondary | ICD-10-CM

## 2012-11-14 DIAGNOSIS — H43819 Vitreous degeneration, unspecified eye: Secondary | ICD-10-CM

## 2012-11-14 DIAGNOSIS — E11319 Type 2 diabetes mellitus with unspecified diabetic retinopathy without macular edema: Secondary | ICD-10-CM

## 2012-12-19 ENCOUNTER — Other Ambulatory Visit: Payer: Self-pay

## 2012-12-19 MED ORDER — TRAMADOL HCL 50 MG PO TABS: 50.0000 mg | ORAL_TABLET | Freq: Four times a day (QID) | ORAL | Status: DC | PRN

## 2012-12-23 NOTE — Telephone Encounter (Addendum)
Tramadol script has been faxed to [facility] at [phone]

## 2013-02-05 ENCOUNTER — Other Ambulatory Visit: Payer: Self-pay

## 2013-02-05 MED ORDER — TRAMADOL HCL 50 MG PO TABS: 50.0000 mg | ORAL_TABLET | Freq: Four times a day (QID) | ORAL | Status: DC | PRN

## 2013-02-05 NOTE — Telephone Encounter (Signed)
Faxed Tramadol script to [facility] [phone]

## 2013-03-10 ENCOUNTER — Other Ambulatory Visit: Payer: Self-pay

## 2013-03-10 MED ORDER — CYCLOBENZAPRINE HCL 5 MG PO TABS: ORAL_TABLET | ORAL | Status: DC

## 2013-03-11 ENCOUNTER — Other Ambulatory Visit: Payer: Self-pay

## 2013-03-11 MED ORDER — TRAMADOL HCL 50 MG PO TABS
50.0000 mg | ORAL_TABLET | Freq: Four times a day (QID) | ORAL | Status: DC | PRN
Start: 2013-03-11 — End: 2013-04-08

## 2013-03-11 NOTE — Telephone Encounter (Signed)
Patient called requesting a refill on her Tramadol be called in to [facility] on [address].

## 2013-04-08 ENCOUNTER — Other Ambulatory Visit: Payer: Self-pay | Admitting: Internal Medicine

## 2013-04-09 ENCOUNTER — Other Ambulatory Visit: Payer: Self-pay | Admitting: Internal Medicine

## 2013-05-01 ENCOUNTER — Ambulatory Visit (INDEPENDENT_AMBULATORY_CARE_PROVIDER_SITE_OTHER): Payer: Medicare Other | Admitting: Internal Medicine

## 2013-05-01 ENCOUNTER — Encounter: Payer: Self-pay | Admitting: Internal Medicine

## 2013-05-01 ENCOUNTER — Other Ambulatory Visit (INDEPENDENT_AMBULATORY_CARE_PROVIDER_SITE_OTHER): Payer: Medicare Other

## 2013-05-01 VITALS — BP 172/82 | HR 86 | Temp 98.5°F | Wt 166.4 lb

## 2013-05-01 DIAGNOSIS — Z23 Encounter for immunization: Secondary | ICD-10-CM

## 2013-05-01 DIAGNOSIS — R350 Frequency of micturition: Secondary | ICD-10-CM

## 2013-05-01 DIAGNOSIS — E119 Type 2 diabetes mellitus without complications: Secondary | ICD-10-CM

## 2013-05-01 LAB — POCT URINALYSIS DIPSTICK
Bilirubin, UA: NEGATIVE
Glucose, UA: NEGATIVE
Nitrite, UA: NEGATIVE
Urobilinogen, UA: 0.2

## 2013-05-01 MED ORDER — SULFAMETHOXAZOLE-TMP DS 800-160 MG PO TABS: 1.0000 | ORAL_TABLET | Freq: Two times a day (BID) | ORAL | Status: DC

## 2013-05-01 NOTE — Progress Notes (Signed)
Subjective:    Patient ID: SRIYA KROEZE, female    DOB: 1932/03/06, 79 y.o.   MRN: [id number]  HPI Mrs. Halls presents for a two day h/o dysuria, frequency, no fever, no flank pain, no N/V. She has post micturition bladder cramping. Dip U/A + Leu.  PMH, FamHx and SocHx reviewed for any changes and relevance. Current Outpatient Prescriptions on File Prior to Visit  Medication Sig Dispense Refill  . Calcium Carbonate-Vitamin D 600-400 MG-UNIT per tablet Take 1 tablet by mouth daily.      . cyclobenzaprine (FLEXERIL) 5 MG tablet 1 tab by mouth at bedtime as needed  90 tablet  1  . enalapril (VASOTEC) 20 MG tablet Take 1 tablet (20 mg total) by mouth daily.  90 tablet  3  . furosemide (LASIX) 20 MG tablet Take 1 tablet (20 mg total) by mouth daily.  90 tablet  3  . glipiZIDE (GLIPIZIDE XL) 2.5 MG 24 hr tablet Take 1 tablet (2.5 mg total) by mouth daily.  90 tablet  3  . glucose blood (ACCU-CHEK COMPACT TEST DRUM) test strip Use as instructed  100 each  12  . indomethacin (INDOCIN) 25 MG capsule Take 25 mg by mouth every 6 (six) hours as needed. arthritis      . meloxicam (MOBIC) 15 MG tablet Take 1 tablet (15 mg total) by mouth daily.  90 tablet  1  . metFORMIN (GLUCOPHAGE) 1000 MG tablet Take 1 tablet (1,000 mg total) by mouth 2 (two) times daily with a meal.  180 tablet  3  . omeprazole (PRILOSEC) 20 MG capsule Take 1 capsule (20 mg total) by mouth daily.  90 capsule  3  . oxybutynin (DITROPAN-XL) 5 MG 24 hr tablet Take 1 tablet (5 mg total) by mouth daily.  90 tablet  3  . phenobarbital-belladonna alkaloids (DONNATAL EXTENTABS) 48 MG CR tablet Take 1 tablet (48 mg total) by mouth daily as needed.  90 tablet  1  . traMADol (ULTRAM) 50 MG tablet TAKE ONE TABLET BY MOUTH EVERY 6 HOURS AS NEEDED FOR PAIN  120 tablet  0  . [DISCONTINUED] oxybutynin (DITROPAN-XL) 5 MG 24 hr tablet Take 5 mg by mouth daily.       No current facility-administered medications on file prior to visit.       Review of Systems System review is negative for any constitutional, cardiac, pulmonary, GI or neuro symptoms or complaints other than as described in the HPI.     Objective:   Physical Exam Filed Vitals:   05/01/13 1032  BP: 172/82  Pulse: 86  Temp: 98.5 F (36.9 C)   Gen'l- over wieght older woman in no distress Cor- RRR Pulm - normal respirations Abd - suprapubic tenderness, no flank tenderness  Recent Results (from the past 2160 hour(s))  POCT URINALYSIS DIPSTICK     Status: Abnormal   Collection Time    05/01/13 10:44 AM      Result Value Range   Color, UA light yellow     Clarity, UA clear     Glucose, UA negative     Bilirubin, UA negative     Ketones, UA negative     Spec Grav, UA 1.015     Blood, UA hemolyzed trace     pH, UA 6.0     Protein, UA negative     Urobilinogen, UA 0.2     Nitrite, UA negative     Leukocytes, UA moderate (2+)    HEMOGLOBIN  A1C     Status: Abnormal   Collection Time    05/01/13 11:56 AM      Result Value Range   Hemoglobin A1C 6.9 (*) 4.6 - 6.5 %   Comment: Glycemic Control Guidelines for People with Diabetes:Non Diabetic:  <6%Goal of Therapy: <7%Additional Action Suggested:  >8%         Assessment & Plan:  Bladder infection based on history, exam and urinalysis.  Plan Septra DS twice a day for 5 days  Fluids, including cranberry juice

## 2013-05-01 NOTE — Progress Notes (Signed)
Pre visit review using our clinic review tool, if applicable. No additional management support is needed unless otherwise documented below in the visit note. 

## 2013-05-01 NOTE — Patient Instructions (Signed)
Bladder infection based on history, exam and urinalysis.  Plan Septra DS twice a day for 5 days  Fluids, including cranberry juice  Will recheck the A1C for diabetes check today. You had normal labs inMay and these do not need to be repeated - the results were good.  Flu shot today. Return in 3 weeks for a companion pneumonia vaccine Prevnar.  Please get the shingles vaccine - prescription provided for pharmacy administration.

## 2013-05-03 NOTE — Assessment & Plan Note (Signed)
A1C 6.9% - good control on present medications. She is advised to have regular meals given the risk of hypoglycemia with sulfonylureas

## 2013-05-05 ENCOUNTER — Telehealth: Payer: Self-pay | Admitting: *Deleted

## 2013-05-05 ENCOUNTER — Other Ambulatory Visit: Payer: Self-pay | Admitting: Internal Medicine

## 2013-05-05 MED ORDER — TRAMADOL HCL 50 MG PO TABS: ORAL_TABLET | ORAL | Status: DC

## 2013-05-05 NOTE — Telephone Encounter (Signed)
Pt called requesting Tramadol refill.  Please advise 

## 2013-05-05 NOTE — Telephone Encounter (Signed)
Ok for refill  x3 

## 2013-05-05 NOTE — Telephone Encounter (Signed)
Tramadol refilled x3

## 2013-05-14 ENCOUNTER — Other Ambulatory Visit: Payer: Self-pay

## 2013-05-14 MED ORDER — CYCLOBENZAPRINE HCL 5 MG PO TABS: ORAL_TABLET | ORAL | Status: DC

## 2013-05-14 MED ORDER — TRAMADOL HCL 50 MG PO TABS: 50.0000 mg | ORAL_TABLET | Freq: Four times a day (QID) | ORAL | Status: DC | PRN

## 2013-05-14 NOTE — Telephone Encounter (Signed)
Tramadol script faxed to [facility]

## 2013-05-14 NOTE — Addendum Note (Signed)
Addended by: Lyanne Co R on: 05/14/2013 01:46 PM   Modules accepted: Orders

## 2013-07-01 ENCOUNTER — Other Ambulatory Visit: Payer: Self-pay

## 2013-07-01 MED ORDER — MELOXICAM 15 MG PO TABS: 15.0000 mg | ORAL_TABLET | Freq: Every day | ORAL | Status: DC

## 2013-07-10 ENCOUNTER — Other Ambulatory Visit: Payer: Self-pay | Admitting: *Deleted

## 2013-07-10 MED ORDER — MELOXICAM 15 MG PO TABS: 15.0000 mg | ORAL_TABLET | Freq: Every day | ORAL | Status: DC

## 2013-07-10 MED ORDER — CYCLOBENZAPRINE HCL 5 MG PO TABS: ORAL_TABLET | ORAL | Status: DC

## 2013-07-10 MED ORDER — INDOMETHACIN 25 MG PO CAPS: 25.0000 mg | ORAL_CAPSULE | Freq: Four times a day (QID) | ORAL | Status: DC | PRN

## 2013-07-10 MED ORDER — OXYBUTYNIN CHLORIDE ER 5 MG PO TB24: 5.0000 mg | ORAL_TABLET | Freq: Every day | ORAL | Status: DC

## 2013-07-10 MED ORDER — ENALAPRIL MALEATE 20 MG PO TABS: 20.0000 mg | ORAL_TABLET | Freq: Every day | ORAL | Status: DC

## 2013-07-10 MED ORDER — OMEPRAZOLE 20 MG PO CPDR: 20.0000 mg | DELAYED_RELEASE_CAPSULE | Freq: Every day | ORAL | Status: DC

## 2013-07-10 MED ORDER — TRAMADOL HCL 50 MG PO TABS: 50.0000 mg | ORAL_TABLET | Freq: Four times a day (QID) | ORAL | Status: DC | PRN

## 2013-07-10 MED ORDER — GLIPIZIDE ER 2.5 MG PO TB24
2.5000 mg | ORAL_TABLET | Freq: Every day | ORAL | Status: DC
Start: 2013-07-10 — End: 2014-08-04

## 2013-07-10 MED ORDER — METFORMIN HCL 1000 MG PO TABS: 1000.0000 mg | ORAL_TABLET | Freq: Two times a day (BID) | ORAL | Status: DC

## 2013-07-10 MED ORDER — FUROSEMIDE 20 MG PO TABS: 20.0000 mg | ORAL_TABLET | Freq: Every day | ORAL | Status: DC

## 2013-07-10 NOTE — Telephone Encounter (Signed)
Pt left msg on triage stating she called yesterday left msg her insurance has change, and she has to use [facility] to get her medications. Requesting md to send renewals on all meds into [facility] on [address]...Andrea Wilkinson

## 2013-07-10 NOTE — Telephone Encounter (Signed)
Sent new scripts on all meds except tramadol. Inform pt medications has been sent to [facility] she states will no longer use mail service...Andrea Wilkinson

## 2013-07-11 NOTE — Telephone Encounter (Signed)
Tramadol has been called to pharmacy  

## 2013-07-15 ENCOUNTER — Telehealth: Payer: Self-pay

## 2013-07-15 NOTE — Telephone Encounter (Signed)
Med list updated. I spoke with [name] and let her know to cancel the Indomethacin.

## 2013-07-15 NOTE — Telephone Encounter (Signed)
Should be taking meloxicam, not indomethacin. (this was dealt with already, I thought).

## 2013-07-15 NOTE — Telephone Encounter (Signed)
Phone call from [facility] on [address] [phone]) wanting to verify if patient is to be on Meloxicam and Indomethacin. Please advise.

## 2013-08-22 ENCOUNTER — Other Ambulatory Visit: Payer: Self-pay

## 2013-08-22 MED ORDER — TRAMADOL HCL 50 MG PO TABS: 50.0000 mg | ORAL_TABLET | Freq: Four times a day (QID) | ORAL | Status: DC | PRN

## 2013-08-22 MED ORDER — ENALAPRIL MALEATE 20 MG PO TABS: 20.0000 mg | ORAL_TABLET | Freq: Every day | ORAL | Status: DC

## 2013-08-22 MED ORDER — FUROSEMIDE 20 MG PO TABS: 20.0000 mg | ORAL_TABLET | Freq: Every day | ORAL | Status: DC

## 2013-08-25 ENCOUNTER — Other Ambulatory Visit: Payer: Self-pay

## 2013-08-25 MED ORDER — MELOXICAM 15 MG PO TABS: 15.0000 mg | ORAL_TABLET | Freq: Every day | ORAL | Status: AC

## 2013-08-25 MED ORDER — CYCLOBENZAPRINE HCL 5 MG PO TABS: ORAL_TABLET | ORAL | Status: DC

## 2013-08-25 MED ORDER — METFORMIN HCL 1000 MG PO TABS: 1000.0000 mg | ORAL_TABLET | Freq: Two times a day (BID) | ORAL | Status: DC

## 2013-08-28 ENCOUNTER — Telehealth: Payer: Self-pay

## 2013-08-28 NOTE — Telephone Encounter (Signed)
Received a favorable outcome

## 2013-08-28 NOTE — Telephone Encounter (Signed)
PA has been submitted to patient's insurance for cyclobenzaprine 5 mg

## 2013-08-29 ENCOUNTER — Telehealth: Payer: Self-pay

## 2013-08-29 NOTE — Telephone Encounter (Signed)
Opened in error

## 2013-09-08 ENCOUNTER — Ambulatory Visit: Payer: Medicare Other | Admitting: Internal Medicine

## 2013-09-17 ENCOUNTER — Ambulatory Visit: Payer: Medicare Other | Admitting: Physician Assistant

## 2013-09-23 ENCOUNTER — Other Ambulatory Visit: Payer: Self-pay

## 2013-09-23 MED ORDER — OMEPRAZOLE 20 MG PO CPDR: 20.0000 mg | DELAYED_RELEASE_CAPSULE | Freq: Every day | ORAL | Status: DC

## 2013-09-24 ENCOUNTER — Encounter: Payer: Self-pay | Admitting: Internal Medicine

## 2013-09-24 ENCOUNTER — Telehealth: Payer: Self-pay | Admitting: Internal Medicine

## 2013-09-24 ENCOUNTER — Other Ambulatory Visit (INDEPENDENT_AMBULATORY_CARE_PROVIDER_SITE_OTHER): Payer: Commercial Managed Care - HMO

## 2013-09-24 ENCOUNTER — Ambulatory Visit (INDEPENDENT_AMBULATORY_CARE_PROVIDER_SITE_OTHER): Payer: Commercial Managed Care - HMO | Admitting: Internal Medicine

## 2013-09-24 VITALS — BP 136/90 | HR 90 | Temp 98.4°F | Resp 16 | Ht 60.0 in | Wt 164.0 lb

## 2013-09-24 DIAGNOSIS — N390 Urinary tract infection, site not specified: Secondary | ICD-10-CM

## 2013-09-24 DIAGNOSIS — E1165 Type 2 diabetes mellitus with hyperglycemia: Principal | ICD-10-CM

## 2013-09-24 DIAGNOSIS — E785 Hyperlipidemia, unspecified: Secondary | ICD-10-CM

## 2013-09-24 DIAGNOSIS — IMO0001 Reserved for inherently not codable concepts without codable children: Secondary | ICD-10-CM

## 2013-09-24 DIAGNOSIS — I10 Essential (primary) hypertension: Secondary | ICD-10-CM

## 2013-09-24 DIAGNOSIS — M1990 Unspecified osteoarthritis, unspecified site: Secondary | ICD-10-CM

## 2013-09-24 DIAGNOSIS — K130 Diseases of lips: Secondary | ICD-10-CM | POA: Insufficient documentation

## 2013-09-24 LAB — URINALYSIS, ROUTINE W REFLEX MICROSCOPIC
Bilirubin Urine: NEGATIVE
Hgb urine dipstick: NEGATIVE
Ketones, ur: NEGATIVE
Nitrite: NEGATIVE
PH: 7 (ref 5.0–8.0)
RBC / HPF: NONE SEEN (ref 0–?)
SPECIFIC GRAVITY, URINE: 1.015 (ref 1.000–1.030)
Total Protein, Urine: NEGATIVE
URINE GLUCOSE: NEGATIVE
UROBILINOGEN UA: 1 (ref 0.0–1.0)

## 2013-09-24 LAB — TSH: TSH: 0.79 u[IU]/mL (ref 0.35–5.50)

## 2013-09-24 LAB — COMPREHENSIVE METABOLIC PANEL
ALK PHOS: 66 U/L (ref 39–117)
ALT: 18 U/L (ref 0–35)
AST: 21 U/L (ref 0–37)
Albumin: 4.4 g/dL (ref 3.5–5.2)
BUN: 18 mg/dL (ref 6–23)
CO2: 29 mEq/L (ref 19–32)
Calcium: 10.1 mg/dL (ref 8.4–10.5)
Chloride: 99 mEq/L (ref 96–112)
Creatinine, Ser: 1.1 mg/dL (ref 0.4–1.2)
GFR: 50.8 mL/min — ABNORMAL LOW (ref >60.00)
GLUCOSE: 177 mg/dL — AB (ref 70–99)
Potassium: 4.1 mEq/L (ref 3.5–5.1)
Sodium: 139 mEq/L (ref 135–145)
TOTAL PROTEIN: 7.8 g/dL (ref 6.0–8.3)
Total Bilirubin: 0.6 mg/dL (ref 0.3–1.2)

## 2013-09-24 LAB — CBC WITH DIFFERENTIAL/PLATELET
BASOS PCT: 0.8 % (ref 0.0–3.0)
Basophils Absolute: 0.1 10*3/uL (ref 0.0–0.1)
EOS PCT: 2.3 % (ref 0.0–5.0)
Eosinophils Absolute: 0.2 10*3/uL (ref 0.0–0.7)
HEMATOCRIT: 38.2 % (ref 36.0–46.0)
Hemoglobin: 12.7 g/dL (ref 12.0–15.0)
LYMPHS ABS: 2.5 10*3/uL (ref 0.7–4.0)
Lymphocytes Relative: 33.2 % (ref 12.0–46.0)
MCHC: 33.2 g/dL (ref 30.0–36.0)
MCV: 89.8 fl (ref 78.0–100.0)
MONO ABS: 0.5 10*3/uL (ref 0.1–1.0)
Monocytes Relative: 6.9 % (ref 3.0–12.0)
Neutro Abs: 4.3 10*3/uL (ref 1.4–7.7)
Neutrophils Relative %: 56.8 % (ref 43.0–77.0)
Platelets: 231 10*3/uL (ref 150.0–400.0)
RBC: 4.25 Mil/uL (ref 3.87–5.11)
RDW: 13.6 % (ref 11.5–14.6)
WBC: 7.6 10*3/uL (ref 4.5–10.5)

## 2013-09-24 LAB — LIPID PANEL
CHOL/HDL RATIO: 5
CHOLESTEROL: 222 mg/dL — AB (ref 0–200)
HDL: 49.2 mg/dL (ref >39.00)
LDL Cholesterol: 119 mg/dL — ABNORMAL HIGH (ref 0–99)
Triglycerides: 268 mg/dL — ABNORMAL HIGH (ref 0.0–149.0)
VLDL: 53.6 mg/dL — ABNORMAL HIGH (ref 0.0–40.0)

## 2013-09-24 LAB — HEMOGLOBIN A1C: Hgb A1c MFr Bld: 7.7 % — ABNORMAL HIGH (ref 4.6–6.5)

## 2013-09-24 MED ORDER — CIPROFLOXACIN HCL 250 MG PO TABS: 250.0000 mg | ORAL_TABLET | Freq: Two times a day (BID) | ORAL | Status: DC

## 2013-09-24 MED ORDER — CLOTRIMAZOLE 10 MG MT LOZG: 10.0000 mg | LOZENGE | Freq: Three times a day (TID) | OROMUCOSAL | Status: DC

## 2013-09-24 NOTE — Progress Notes (Signed)
Pre visit review using our clinic review tool, if applicable. No additional management support is needed unless otherwise documented below in the visit note. 

## 2013-09-24 NOTE — Progress Notes (Signed)
Subjective:    Patient ID: Andrea Wilkinson, female    DOB: 1931-11-09, 80 y.o.   MRN: [id number]  Dysuria  This is a new problem. The problem occurs every urination. The problem has been unchanged. The quality of the pain is described as burning. The pain is at a severity of 1/10. The patient is experiencing no pain. There has been no fever. The fever has been present for less than 1 day. She is not sexually active. There is no history of pyelonephritis. Associated symptoms include frequency. Pertinent negatives include no chills, discharge, flank pain, hematuria, hesitancy, nausea, possible pregnancy, sweats, urgency or vomiting. She has tried nothing for the symptoms. The treatment provided no relief. Her past medical history is significant for recurrent UTIs and urinary stasis. There is no history of catheterization, kidney stones, a single kidney or a urological procedure.      Review of Systems  Constitutional: Negative.  Negative for fever, chills, diaphoresis, appetite change and fatigue.  HENT: Negative.   Eyes: Negative.   Respiratory: Negative.  Negative for cough, choking, chest tightness, shortness of breath, wheezing and stridor.   Cardiovascular: Negative.  Negative for chest pain, palpitations and leg swelling.  Gastrointestinal: Negative.  Negative for nausea, vomiting, abdominal pain, diarrhea, constipation and blood in stool.  Endocrine: Negative.  Negative for polydipsia, polyphagia and polyuria.  Genitourinary: Positive for dysuria and frequency. Negative for hesitancy, urgency, hematuria, flank pain, decreased urine volume, enuresis, difficulty urinating and dyspareunia.  Musculoskeletal: Positive for arthralgias. Negative for back pain, joint swelling, neck pain and neck stiffness.  Skin: Positive for rash. Negative for color change, pallor and wound.       She has a red irritated place in the left corner of her mouth that has been present for several months     Allergic/Immunologic: Negative.   Neurological: Negative.   Hematological: Negative.  Negative for adenopathy. Does not bruise/bleed easily.  Psychiatric/Behavioral: Negative.        Objective:   Physical Exam  Vitals reviewed. Constitutional: She is oriented to person, place, and time. She appears well-developed and well-nourished.  Non-toxic appearance. She does not have a sickly appearance. She does not appear ill. No distress.  HENT:  Head: Normocephalic and atraumatic.  Mouth/Throat: Oropharynx is clear and moist and mucous membranes are normal. Mucous membranes are not pale, not dry and not cyanotic. No oropharyngeal exudate, posterior oropharyngeal edema, posterior oropharyngeal erythema or tonsillar abscesses.    Eyes: Conjunctivae are normal. Right eye exhibits no discharge. Left eye exhibits no discharge. No scleral icterus.  Neck: Normal range of motion. Neck supple. No JVD present. No tracheal deviation present. No thyromegaly present.  Cardiovascular: Normal rate, regular rhythm, normal heart sounds and intact distal pulses.  Exam reveals no gallop and no friction rub.   No murmur heard. Pulmonary/Chest: Effort normal and breath sounds normal. No stridor. No respiratory distress. She has no wheezes. She has no rales. She exhibits no tenderness.  Abdominal: Soft. Bowel sounds are normal. She exhibits no distension and no mass. There is no hepatosplenomegaly, splenomegaly or hepatomegaly. There is no tenderness. There is no rebound, no guarding and no CVA tenderness.  Musculoskeletal: Normal range of motion. She exhibits no edema and no tenderness.  Lymphadenopathy:    She has no cervical adenopathy.  Neurological: She is oriented to person, place, and time.  Skin: Skin is warm and dry. No rash noted. She is not diaphoretic. No erythema. No pallor.  Psychiatric: She has  a normal mood and affect. Her behavior is normal. Judgment and thought content normal.     Lab Results   Component Value Date   WBC 8.2 02/26/2012   HGB 12.9 02/26/2012   HCT 39.5 02/26/2012   PLT 230.0 02/26/2012   GLUCOSE 205* 10/29/2012   CHOL 198 10/29/2012   TRIG 339.0* 10/29/2012   HDL 48.60 10/29/2012   LDLDIRECT 101.7 10/29/2012   LDLCALC 112* 05/20/2008   ALT 22 10/29/2012   AST 22 10/29/2012   NA 138 10/29/2012   K 4.2 10/29/2012   CL 100 10/29/2012   CREATININE 0.9 10/29/2012   BUN 17 10/29/2012   CO2 29 10/29/2012   TSH 0.95 05/06/2009   INR 1.00 10/31/2010   HGBA1C 6.9* 05/01/2013       Assessment & Plan:

## 2013-09-24 NOTE — Telephone Encounter (Signed)
Relevant patient education mailed to patient.  

## 2013-09-24 NOTE — Patient Instructions (Signed)
Urinary Tract Infection  Urinary tract infections (UTIs) can develop anywhere along your urinary tract. Your urinary tract is your body's drainage system for removing wastes and extra water. Your urinary tract includes two kidneys, two ureters, a bladder, and a urethra. Your kidneys are a pair of bean-shaped organs. Each kidney is about the size of your fist. They are located below your ribs, one on each side of your spine.  CAUSES  Infections are caused by microbes, which are microscopic organisms, including fungi, viruses, and bacteria. These organisms are so small that they can only be seen through a microscope. Bacteria are the microbes that most commonly cause UTIs.  SYMPTOMS   Symptoms of UTIs may vary by age and gender of the patient and by the location of the infection. Symptoms in young women typically include a frequent and intense urge to urinate and a painful, burning feeling in the bladder or urethra during urination. Older women and men are more likely to be tired, shaky, and weak and have muscle aches and abdominal pain. A fever may mean the infection is in your kidneys. Other symptoms of a kidney infection include pain in your back or sides below the ribs, nausea, and vomiting.  DIAGNOSIS  To diagnose a UTI, your caregiver will ask you about your symptoms. Your caregiver also will ask to provide a urine sample. The urine sample will be tested for bacteria and white blood cells. White blood cells are made by your body to help fight infection.  TREATMENT   Typically, UTIs can be treated with medication. Because most UTIs are caused by a bacterial infection, they usually can be treated with the use of antibiotics. The choice of antibiotic and length of treatment depend on your symptoms and the type of bacteria causing your infection.  HOME CARE INSTRUCTIONS   If you were prescribed antibiotics, take them exactly as your caregiver instructs you. Finish the medication even if you feel better after you  have only taken some of the medication.   Drink enough water and fluids to keep your urine clear or pale yellow.   Avoid caffeine, tea, and carbonated beverages. They tend to irritate your bladder.   Empty your bladder often. Avoid holding urine for long periods of time.   Empty your bladder before and after sexual intercourse.   After a bowel movement, women should cleanse from front to back. Use each tissue only once.  SEEK MEDICAL CARE IF:    You have back pain.   You develop a fever.   Your symptoms do not begin to resolve within 3 days.  SEEK IMMEDIATE MEDICAL CARE IF:    You have severe back pain or lower abdominal pain.   You develop chills.   You have nausea or vomiting.   You have continued burning or discomfort with urination.  MAKE SURE YOU:    Understand these instructions.   Will watch your condition.   Will get help right away if you are not doing well or get worse.  Document Released: 03/15/2005 Document Revised: 12/05/2011 Document Reviewed: 07/14/2011  [employer] Patient Information 2014 [employer].

## 2013-09-25 ENCOUNTER — Encounter: Payer: Self-pay | Admitting: Internal Medicine

## 2013-09-25 NOTE — Assessment & Plan Note (Signed)
She will cont tramadol as needed She completed a CSC and did a UDS today

## 2013-09-25 NOTE — Assessment & Plan Note (Signed)
Her BP is well controlled 

## 2013-09-25 NOTE — Assessment & Plan Note (Signed)
Her A1C is relatively well controlled

## 2013-09-25 NOTE — Assessment & Plan Note (Signed)
Will treat with mycelex troches

## 2013-09-25 NOTE — Assessment & Plan Note (Signed)
Her UA is positive, urine clx is pending Will treat with cipro

## 2013-09-26 ENCOUNTER — Encounter: Payer: Self-pay | Admitting: Internal Medicine

## 2013-09-26 LAB — CULTURE, URINE COMPREHENSIVE
Colony Count: NO GROWTH
Organism ID, Bacteria: NO GROWTH

## 2013-10-14 ENCOUNTER — Ambulatory Visit (INDEPENDENT_AMBULATORY_CARE_PROVIDER_SITE_OTHER): Payer: Commercial Managed Care - HMO | Admitting: Internal Medicine

## 2013-10-14 ENCOUNTER — Ambulatory Visit (INDEPENDENT_AMBULATORY_CARE_PROVIDER_SITE_OTHER)
Admission: RE | Admit: 2013-10-14 | Discharge: 2013-10-14 | Disposition: A | Discharge status: Home / Self Care | Payer: Commercial Managed Care - HMO | Source: Ambulatory Visit | Attending: Internal Medicine | Admitting: Internal Medicine

## 2013-10-14 ENCOUNTER — Encounter: Payer: Self-pay | Admitting: Internal Medicine

## 2013-10-14 VITALS — BP 130/80 | HR 89 | Temp 97.9°F | Resp 16 | Wt 167.0 lb

## 2013-10-14 DIAGNOSIS — Z0000 Encounter for general adult medical examination without abnormal findings: Secondary | ICD-10-CM

## 2013-10-14 DIAGNOSIS — R10817 Generalized abdominal tenderness: Secondary | ICD-10-CM

## 2013-10-14 DIAGNOSIS — I10 Essential (primary) hypertension: Secondary | ICD-10-CM

## 2013-10-14 NOTE — Progress Notes (Signed)
Subjective:    Patient ID: Andrea Wilkinson, female    DOB: 1931-08-12, 80 y.o.   MRN: [id number]  Abdominal Pain This is a new problem. The current episode started in the past 7 days. The onset quality is gradual. The problem occurs intermittently. The most recent episode lasted 3 days. The problem has been unchanged. The pain is located in the LLQ. The pain is at a severity of 1/10. The pain is mild. The quality of the pain is aching. The abdominal pain does not radiate. Pertinent negatives include no anorexia, arthralgias, belching, constipation, diarrhea, dysuria, fever, flatus, frequency, headaches, hematochezia, hematuria, melena, myalgias, nausea, vomiting or weight loss. Nothing aggravates the pain. She has tried antibiotics for the symptoms. The treatment provided mild relief. Her past medical history is significant for abdominal surgery. There is no history of colon cancer, Crohn's disease, gallstones, GERD, irritable bowel syndrome, pancreatitis, PUD or ulcerative colitis.      Review of Systems  Constitutional: Negative.  Negative for fever, chills, weight loss, diaphoresis, appetite change and fatigue.  HENT: Negative.   Eyes: Negative.   Respiratory: Negative.  Negative for choking, chest tightness, shortness of breath, wheezing and stridor.   Cardiovascular: Negative.  Negative for chest pain, palpitations and leg swelling.  Gastrointestinal: Positive for abdominal pain. Negative for nausea, vomiting, diarrhea, constipation, blood in stool, melena, hematochezia, abdominal distention, anal bleeding, rectal pain, anorexia and flatus.  Endocrine: Negative.   Genitourinary: Negative.  Negative for dysuria, urgency, frequency, hematuria, flank pain, decreased urine volume, vaginal bleeding, vaginal discharge, difficulty urinating and vaginal pain.  Musculoskeletal: Negative.  Negative for arthralgias and myalgias.  Skin: Negative.   Allergic/Immunologic: Negative.   Neurological:  Negative.  Negative for headaches.  Hematological: Negative.  Negative for adenopathy. Does not bruise/bleed easily.  Psychiatric/Behavioral: Negative.        Objective:   Physical Exam  Vitals reviewed. Constitutional: She is oriented to person, place, and time. She appears well-developed and well-nourished.  Non-toxic appearance. She does not have a sickly appearance. She does not appear ill. No distress.  HENT:  Head: Normocephalic and atraumatic.  Mouth/Throat: Oropharynx is clear and moist. No oropharyngeal exudate.  Eyes: Conjunctivae are normal. Right eye exhibits no discharge. Left eye exhibits no discharge. No scleral icterus.  Neck: Normal range of motion. Neck supple. No JVD present. No tracheal deviation present. No thyromegaly present.  Cardiovascular: Normal rate, regular rhythm, normal heart sounds and intact distal pulses.  Exam reveals no gallop and no friction rub.   No murmur heard. Pulmonary/Chest: Effort normal and breath sounds normal. No stridor. No respiratory distress. She has no wheezes. She has no rales. She exhibits no tenderness.  Abdominal: Soft. Normal appearance and bowel sounds are normal. She exhibits no shifting dullness, no distension, no pulsatile liver, no fluid wave, no abdominal bruit, no ascites, no pulsatile midline mass and no mass. There is no hepatosplenomegaly, splenomegaly or hepatomegaly. There is tenderness in the right lower quadrant and left lower quadrant. There is no rebound, no guarding, no CVA tenderness, no tenderness at McBurney's point and negative Murphy's sign. No hernia. Hernia confirmed negative in the ventral area, confirmed negative in the right inguinal area and confirmed negative in the left inguinal area.  Musculoskeletal: Normal range of motion. She exhibits no edema and no tenderness.  Lymphadenopathy:    She has no cervical adenopathy.  Neurological: She is oriented to person, place, and time.  Skin: Skin is warm and dry. No  rash  noted. She is not diaphoretic. No erythema. No pallor.     Lab Results  Component Value Date   WBC 7.6 09/24/2013   HGB 12.7 09/24/2013   HCT 38.2 09/24/2013   PLT 231.0 09/24/2013   GLUCOSE 177* 09/24/2013   CHOL 222* 09/24/2013   TRIG 268.0* 09/24/2013   HDL 49.20 09/24/2013   LDLDIRECT 101.7 10/29/2012   LDLCALC 119* 09/24/2013   ALT 18 09/24/2013   AST 21 09/24/2013   NA 139 09/24/2013   K 4.1 09/24/2013   CL 99 09/24/2013   CREATININE 1.1 09/24/2013   BUN 18 09/24/2013   CO2 29 09/24/2013   TSH 0.79 09/24/2013   INR 1.00 10/31/2010   HGBA1C 7.7* 09/24/2013       Assessment & Plan:

## 2013-10-14 NOTE — Assessment & Plan Note (Signed)
Her BP is well controlled 

## 2013-10-14 NOTE — Assessment & Plan Note (Signed)
Her recent labs were normal, her exam today is benign Plain films show stool retention She will treat the constipation and will let me know how she responds to that If the pain persists then I will consider labs and/or CT scan

## 2013-10-14 NOTE — Patient Instructions (Signed)
Abdominal Pain, Adult °Many things can cause abdominal pain. Usually, abdominal pain is not caused by a disease and will improve without treatment. It can often be observed and treated at home. Your health care provider will do a physical exam and possibly order blood tests and X-rays to help determine the seriousness of your pain. However, in many cases, more time must pass before a clear cause of the pain can be found. Before that point, your health care provider may not know if you need more testing or further treatment. °HOME CARE INSTRUCTIONS  °Monitor your abdominal pain for any changes. The following actions may help to alleviate any discomfort you are experiencing: °· Only take over-the-counter or prescription medicines as directed by your health care provider. °· Do not take laxatives unless directed to do so by your health care provider. °· Try a clear liquid diet (broth, tea, or water) as directed by your health care provider. Slowly move to a bland diet as tolerated. °SEEK MEDICAL CARE IF: °· You have unexplained abdominal pain. °· You have abdominal pain associated with nausea or diarrhea. °· You have pain when you urinate or have a bowel movement. °· You experience abdominal pain that wakes you in the night. °· You have abdominal pain that is worsened or improved by eating food. °· You have abdominal pain that is worsened with eating fatty foods. °SEEK IMMEDIATE MEDICAL CARE IF:  °· Your pain does not go away within 2 hours. °· You have a fever. °· You keep throwing up (vomiting). °· Your pain is felt only in portions of the abdomen, such as the right side or the left lower portion of the abdomen. °· You pass bloody or black tarry stools. °MAKE SURE YOU: °· Understand these instructions.   °· Will watch your condition.   °· Will get help right away if you are not doing well or get worse.   °Document Released: 03/15/2005 Document Revised: 03/26/2013 Document Reviewed: 02/12/2013 °Exit[employer]® Patient  Information ©2014 [employer]. ° °

## 2013-10-14 NOTE — Progress Notes (Signed)
Pre visit review using our clinic review tool, if applicable. No additional management support is needed unless otherwise documented below in the visit note. 

## 2013-10-27 ENCOUNTER — Telehealth: Payer: Self-pay | Admitting: Internal Medicine

## 2013-10-27 NOTE — Telephone Encounter (Signed)
Pt called stated that [facility] request our office to fax in written Rx or get an approval from Dr. Ronnald Ramp for Cyclobenzaprine. Please advise.

## 2013-10-28 NOTE — Telephone Encounter (Signed)
cyclobenzaprine (FLEXERIL) 5 MG tablet 90 tablet 3 08/25/2013     Sig: 1 tab by mouth at bedtime as needed    E-Prescribing Status: Receipt confirmed by pharmacy (08/25/2013 12:48 PM EDT)       Sent to mail order on 08/25/13 # 90 with refills, too soon to approve

## 2013-11-06 ENCOUNTER — Encounter: Payer: Self-pay | Admitting: Internal Medicine

## 2013-11-11 ENCOUNTER — Ambulatory Visit (INDEPENDENT_AMBULATORY_CARE_PROVIDER_SITE_OTHER): Payer: Commercial Managed Care - HMO | Admitting: Ophthalmology

## 2013-11-11 DIAGNOSIS — H43819 Vitreous degeneration, unspecified eye: Secondary | ICD-10-CM

## 2013-11-11 DIAGNOSIS — E11319 Type 2 diabetes mellitus with unspecified diabetic retinopathy without macular edema: Secondary | ICD-10-CM

## 2013-11-11 DIAGNOSIS — H35039 Hypertensive retinopathy, unspecified eye: Secondary | ICD-10-CM

## 2013-11-11 DIAGNOSIS — H35379 Puckering of macula, unspecified eye: Secondary | ICD-10-CM

## 2013-11-11 DIAGNOSIS — E1139 Type 2 diabetes mellitus with other diabetic ophthalmic complication: Secondary | ICD-10-CM

## 2013-11-11 DIAGNOSIS — I10 Essential (primary) hypertension: Secondary | ICD-10-CM

## 2013-11-11 DIAGNOSIS — E1165 Type 2 diabetes mellitus with hyperglycemia: Secondary | ICD-10-CM

## 2013-11-11 DIAGNOSIS — H3530 Unspecified macular degeneration: Secondary | ICD-10-CM

## 2013-11-14 ENCOUNTER — Telehealth: Payer: Self-pay | Admitting: Internal Medicine

## 2013-11-14 MED ORDER — TRAMADOL HCL 50 MG PO TABS: 50.0000 mg | ORAL_TABLET | Freq: Four times a day (QID) | ORAL | Status: DC | PRN

## 2013-11-14 NOTE — Telephone Encounter (Signed)
Patient is calling to request a refill on her Tramadol, rx. Patient uses [facility] mail order pharmacy.

## 2013-11-14 NOTE — Telephone Encounter (Signed)
done

## 2013-12-15 ENCOUNTER — Telehealth: Payer: Self-pay

## 2013-12-15 DIAGNOSIS — M1990 Unspecified osteoarthritis, unspecified site: Secondary | ICD-10-CM

## 2013-12-15 NOTE — Telephone Encounter (Signed)
done

## 2013-12-15 NOTE — Telephone Encounter (Signed)
Patient called requesting referral to cover the appt that she has schedule with Dr. Alvan Dame w/ [facility] on Wednesday 12/17/13. Thanks

## 2014-01-07 ENCOUNTER — Other Ambulatory Visit: Payer: Self-pay | Admitting: *Deleted

## 2014-01-07 MED ORDER — ACCU-CHEK AVIVA PLUS W/DEVICE KIT: PACK | Status: AC

## 2014-01-07 MED ORDER — GLUCOSE BLOOD VI STRP: ORAL_STRIP | Status: DC

## 2014-01-07 MED ORDER — ACCU-CHEK MULTICLIX LANCETS MISC
Status: DC
Start: 2014-01-07 — End: 2016-04-14

## 2014-01-07 NOTE — Telephone Encounter (Signed)
Pt states [facility] has been trying to get her diabetic supplies. Inform pt we will send script to [facility] for her diabetic supplies...Johny Chess

## 2014-01-27 ENCOUNTER — Encounter: Payer: Self-pay | Admitting: Gastroenterology

## 2014-01-28 LAB — HM DIABETES EYE EXAM

## 2014-03-17 ENCOUNTER — Encounter: Payer: Self-pay | Admitting: Internal Medicine

## 2014-03-17 ENCOUNTER — Ambulatory Visit (INDEPENDENT_AMBULATORY_CARE_PROVIDER_SITE_OTHER): Payer: Commercial Managed Care - HMO | Admitting: Internal Medicine

## 2014-03-17 VITALS — BP 140/80 | HR 88 | Temp 98.2°F | Resp 16 | Wt 164.0 lb

## 2014-03-17 DIAGNOSIS — I10 Essential (primary) hypertension: Secondary | ICD-10-CM

## 2014-03-17 DIAGNOSIS — M479 Spondylosis, unspecified: Secondary | ICD-10-CM

## 2014-03-17 DIAGNOSIS — T50995A Adverse effect of other drugs, medicaments and biological substances, initial encounter: Secondary | ICD-10-CM

## 2014-03-17 DIAGNOSIS — E785 Hyperlipidemia, unspecified: Secondary | ICD-10-CM

## 2014-03-17 DIAGNOSIS — IMO0001 Reserved for inherently not codable concepts without codable children: Secondary | ICD-10-CM

## 2014-03-17 DIAGNOSIS — E1165 Type 2 diabetes mellitus with hyperglycemia: Secondary | ICD-10-CM

## 2014-03-17 DIAGNOSIS — T7840XA Allergy, unspecified, initial encounter: Secondary | ICD-10-CM | POA: Insufficient documentation

## 2014-03-17 MED ORDER — METHYLPREDNISOLONE ACETATE 80 MG/ML IJ SUSP
120.0000 mg | Freq: Once | INTRAMUSCULAR | Status: AC
  Administered 2014-03-17: 120 mg via INTRAMUSCULAR

## 2014-03-17 MED ORDER — HYDROXYZINE HCL 50 MG PO TABS: 50.0000 mg | ORAL_TABLET | Freq: Three times a day (TID) | ORAL | Status: DC | PRN

## 2014-03-17 NOTE — Progress Notes (Signed)
Subjective:    Patient ID: Andrea Wilkinson, female    DOB: 1932/02/19, 80 y.o.   MRN: [id number]  Rash This is a new problem. The current episode started in the past 7 days. The problem is unchanged. The rash is diffuse. The rash is characterized by redness, swelling and itchiness. She was exposed to a new medication. Pertinent negatives include no anorexia, congestion, cough, diarrhea, eye pain, facial edema, fatigue, fever, joint pain, nail changes, rhinorrhea, shortness of breath, sore throat or vomiting. Past treatments include anti-itch cream. The treatment provided no relief. There is no history of allergies, asthma, eczema or varicella.      Review of Systems  Constitutional: Negative.  Negative for fever, chills, diaphoresis, activity change, appetite change, fatigue and unexpected weight change.  HENT: Negative.  Negative for congestion, rhinorrhea and sore throat.   Eyes: Negative.  Negative for pain.  Respiratory: Negative.  Negative for cough, choking, chest tightness, shortness of breath and stridor.   Cardiovascular: Negative.  Negative for chest pain, palpitations and leg swelling.  Gastrointestinal: Negative.  Negative for nausea, vomiting, abdominal pain, diarrhea, constipation, blood in stool and anorexia.  Endocrine: Negative.   Genitourinary: Negative.   Musculoskeletal: Negative.  Negative for arthralgias, back pain, joint pain, myalgias and neck pain.  Skin: Positive for color change and rash. Negative for nail changes, pallor and wound.  Allergic/Immunologic: Negative.   Neurological: Negative.   Hematological: Negative.  Negative for adenopathy. Does not bruise/bleed easily.  Psychiatric/Behavioral: Negative.        Objective:   Physical Exam  Vitals reviewed. Constitutional: She is oriented to person, place, and time. She appears well-developed and well-nourished.  Non-toxic appearance. She does not have a sickly appearance. She does not appear ill. No  distress.  HENT:  Head: Normocephalic and atraumatic.  Mouth/Throat: Oropharynx is clear and moist. No oropharyngeal exudate.  Eyes: Conjunctivae are normal. Right eye exhibits no discharge. Left eye exhibits no discharge. No scleral icterus.  Neck: Normal range of motion. Neck supple. No JVD present. No tracheal deviation present. No thyromegaly present.  Cardiovascular: Normal rate, regular rhythm, normal heart sounds and intact distal pulses.  Exam reveals no gallop and no friction rub.   No murmur heard. Pulmonary/Chest: Effort normal and breath sounds normal. No stridor. No respiratory distress. She has no wheezes. She has no rales. She exhibits no tenderness.  Abdominal: Soft. Bowel sounds are normal. She exhibits no distension and no mass. There is no tenderness. There is no rebound and no guarding.  Musculoskeletal: Normal range of motion. She exhibits no edema and no tenderness.  Lymphadenopathy:    She has no cervical adenopathy.  Neurological: She is oriented to person, place, and time.  Skin: Skin is warm and dry. Rash noted. No abrasion, no bruising and no purpura noted. Rash is papular. Rash is not macular, not maculopapular, not nodular, not pustular, not vesicular and not urticarial. She is not diaphoretic. No erythema. No pallor.  There are scattered erythematous papules over the torso and UE and LE, some are blanching urticarial wheals. There are no targets, there is no involvement of the mucous membranes/palms/soles.  Psychiatric: She has a normal mood and affect. Her behavior is normal. Judgment and thought content normal.     Lab Results  Component Value Date   WBC 7.6 09/24/2013   HGB 12.7 09/24/2013   HCT 38.2 09/24/2013   PLT 231.0 09/24/2013   GLUCOSE 177* 09/24/2013   CHOL 222* 09/24/2013  TRIG 268.0* 09/24/2013   HDL 49.20 09/24/2013   LDLDIRECT 101.7 10/29/2012   LDLCALC 119* 09/24/2013   ALT 18 09/24/2013   AST 21 09/24/2013   NA 139 09/24/2013   K 4.1 09/24/2013   CL 99  09/24/2013   CREATININE 1.1 09/24/2013   BUN 18 09/24/2013   CO2 29 09/24/2013   TSH 0.79 09/24/2013   INR 1.00 10/31/2010   HGBA1C 7.7* 09/24/2013       Assessment & Plan:

## 2014-03-17 NOTE — Assessment & Plan Note (Signed)
She will stop vicoprofen, I am not sure if the allergy was caused by ibuprofen or hydrocodone, will list these as an allergy She was given an injection of depo-medrol IM today to treat the allergic reaction, will use hydroxyzine to treat the itching

## 2014-03-17 NOTE — Progress Notes (Signed)
Pre visit review using our clinic review tool, if applicable. No additional management support is needed unless otherwise documented below in the visit note. 

## 2014-03-17 NOTE — Patient Instructions (Signed)

## 2014-03-17 NOTE — Addendum Note (Signed)
Addended by: Cresenciano Lick on: 03/17/2014 01:56 PM   Modules accepted: Orders

## 2014-03-17 NOTE — Assessment & Plan Note (Signed)
Her BP is adequately well controlled 

## 2014-04-07 ENCOUNTER — Encounter: Payer: Self-pay | Admitting: Internal Medicine

## 2014-04-07 ENCOUNTER — Ambulatory Visit (INDEPENDENT_AMBULATORY_CARE_PROVIDER_SITE_OTHER): Payer: Commercial Managed Care - HMO | Admitting: Internal Medicine

## 2014-04-07 ENCOUNTER — Other Ambulatory Visit (INDEPENDENT_AMBULATORY_CARE_PROVIDER_SITE_OTHER): Payer: Commercial Managed Care - HMO

## 2014-04-07 VITALS — BP 122/66 | HR 86 | Temp 97.9°F | Resp 16 | Ht 60.0 in | Wt 164.0 lb

## 2014-04-07 DIAGNOSIS — E118 Type 2 diabetes mellitus with unspecified complications: Secondary | ICD-10-CM

## 2014-04-07 DIAGNOSIS — I10 Essential (primary) hypertension: Secondary | ICD-10-CM

## 2014-04-07 DIAGNOSIS — L239 Allergic contact dermatitis, unspecified cause: Secondary | ICD-10-CM | POA: Insufficient documentation

## 2014-04-07 DIAGNOSIS — L200 Besnier's prurigo: Secondary | ICD-10-CM

## 2014-04-07 DIAGNOSIS — R0609 Other forms of dyspnea: Secondary | ICD-10-CM

## 2014-04-07 DIAGNOSIS — Z23 Encounter for immunization: Secondary | ICD-10-CM

## 2014-04-07 DIAGNOSIS — E785 Hyperlipidemia, unspecified: Secondary | ICD-10-CM

## 2014-04-07 LAB — CBC WITH DIFFERENTIAL/PLATELET
BASOS PCT: 0.3 % (ref 0.0–3.0)
Basophils Absolute: 0 10*3/uL (ref 0.0–0.1)
EOS PCT: 4 % (ref 0.0–5.0)
Eosinophils Absolute: 0.4 10*3/uL (ref 0.0–0.7)
HCT: 41.1 % (ref 36.0–46.0)
Hemoglobin: 13.5 g/dL (ref 12.0–15.0)
LYMPHS ABS: 2.4 10*3/uL (ref 0.7–4.0)
Lymphocytes Relative: 26.4 % (ref 12.0–46.0)
MCHC: 32.8 g/dL (ref 30.0–36.0)
MCV: 90.2 fl (ref 78.0–100.0)
MONO ABS: 0.6 10*3/uL (ref 0.1–1.0)
Monocytes Relative: 6.1 % (ref 3.0–12.0)
NEUTROS ABS: 5.7 10*3/uL (ref 1.4–7.7)
Neutrophils Relative %: 63.2 % (ref 43.0–77.0)
PLATELETS: 272 10*3/uL (ref 150.0–400.0)
RBC: 4.56 Mil/uL (ref 3.87–5.11)
RDW: 14.1 % (ref 11.5–15.5)
WBC: 9.1 10*3/uL (ref 4.0–10.5)

## 2014-04-07 LAB — COMPREHENSIVE METABOLIC PANEL
ALK PHOS: 67 U/L (ref 39–117)
ALT: 14 U/L (ref 0–35)
AST: 18 U/L (ref 0–37)
Albumin: 3.9 g/dL (ref 3.5–5.2)
BILIRUBIN TOTAL: 0.3 mg/dL (ref 0.2–1.2)
BUN: 18 mg/dL (ref 6–23)
CO2: 28 mEq/L (ref 19–32)
Calcium: 10.1 mg/dL (ref 8.4–10.5)
Chloride: 100 mEq/L (ref 96–112)
Creatinine, Ser: 1.1 mg/dL (ref 0.4–1.2)
GFR: 51.81 mL/min — AB (ref >60.00)
GLUCOSE: 247 mg/dL — AB (ref 70–99)
Potassium: 4.3 mEq/L (ref 3.5–5.1)
SODIUM: 137 meq/L (ref 135–145)
TOTAL PROTEIN: 8 g/dL (ref 6.0–8.3)

## 2014-04-07 LAB — LIPID PANEL
Cholesterol: 207 mg/dL — ABNORMAL HIGH (ref 0–200)
HDL: 54.4 mg/dL (ref >39.00)
NonHDL: 152.6
TRIGLYCERIDES: 203 mg/dL — AB (ref 0.0–149.0)
Total CHOL/HDL Ratio: 4
VLDL: 40.6 mg/dL — AB (ref 0.0–40.0)

## 2014-04-07 LAB — HEMOGLOBIN A1C: Hgb A1c MFr Bld: 7.6 % — ABNORMAL HIGH (ref 4.6–6.5)

## 2014-04-07 LAB — TSH: TSH: 0.95 u[IU]/mL (ref 0.35–4.50)

## 2014-04-07 LAB — LDL CHOLESTEROL, DIRECT: Direct LDL: 131.4 mg/dL

## 2014-04-07 NOTE — Patient Instructions (Signed)

## 2014-04-07 NOTE — Progress Notes (Signed)
Subjective:    Patient ID: Andrea Wilkinson, female    DOB: [birth year], 80 y.o.   MRN: [id number]  Hypertension This is a chronic problem. The current episode started more than 1 year ago. The problem is unchanged. The problem is controlled. Associated symptoms include shortness of breath (she complains of worsening DOE). Pertinent negatives include no anxiety, blurred vision, chest pain, headaches, malaise/fatigue, neck pain, orthopnea, palpitations, peripheral edema, PND or sweats. Agents associated with hypertension include NSAIDs. Past treatments include ACE inhibitors and diuretics. The current treatment provides moderate improvement. Compliance problems include exercise and diet.       Review of Systems  Constitutional: Positive for fatigue. Negative for fever, chills, malaise/fatigue, diaphoresis, activity change, appetite change and unexpected weight change.  HENT: Negative.   Eyes: Negative.  Negative for blurred vision.  Respiratory: Positive for shortness of breath (she complains of worsening DOE). Negative for apnea, cough, choking, chest tightness, wheezing and stridor.   Cardiovascular: Negative.  Negative for chest pain, palpitations, orthopnea, leg swelling and PND.  Gastrointestinal: Negative.  Negative for nausea, vomiting, abdominal pain, diarrhea, constipation and blood in stool.  Endocrine: Negative.   Genitourinary: Negative.   Musculoskeletal: Negative.  Negative for arthralgias, back pain, joint swelling, myalgias and neck pain.  Skin: Positive for rash. Negative for color change, pallor and wound.       The last time I saw her she had an "all over" rash, she tells me that that rash has resolved. Today, she complains of a red, itchy area on the right side of her face.  Allergic/Immunologic: Negative.   Neurological: Negative.  Negative for dizziness, tremors, speech difficulty, weakness, light-headedness and headaches.  Hematological: Negative.  Negative for adenopathy.  Does not bruise/bleed easily.  Psychiatric/Behavioral: Negative.        Objective:   Physical Exam  Vitals reviewed. Constitutional: She is oriented to person, place, and time. She appears well-developed and well-nourished. No distress.  HENT:  Head: Normocephalic and atraumatic.  Mouth/Throat: Oropharynx is clear and moist. No oropharyngeal exudate.  Eyes: Conjunctivae are normal. Right eye exhibits no discharge. Left eye exhibits no discharge. No scleral icterus.  Neck: Normal range of motion. Neck supple. No JVD present. No tracheal deviation present. No thyromegaly present.  Cardiovascular: Normal rate, regular rhythm, normal heart sounds and intact distal pulses.  Exam reveals no gallop and no friction rub.   No murmur heard. Pulmonary/Chest: Effort normal and breath sounds normal. No stridor. No respiratory distress. She has no wheezes. She has no rales. She exhibits no tenderness.  Abdominal: Soft. Bowel sounds are normal. She exhibits no distension and no mass. There is no tenderness. There is no rebound and no guarding.  Musculoskeletal: Normal range of motion. She exhibits no edema and no tenderness.  Lymphadenopathy:    She has no cervical adenopathy.  Neurological: She is oriented to person, place, and time.  Skin: Skin is warm and dry. Rash noted. No bruising, no ecchymosis and no purpura noted. Rash is papular. Rash is not macular, not maculopapular, not nodular, not pustular, not vesicular and not urticarial. She is not diaphoretic. No erythema. No pallor.  Right lower face shows an area of scale, erythema  Psychiatric: She has a normal mood and affect. Her behavior is normal. Judgment and thought content normal.     Lab Results  Component Value Date   WBC 7.6 09/24/2013   HGB 12.7 09/24/2013   HCT 38.2 09/24/2013   PLT 231.0 09/24/2013  GLUCOSE 177* 09/24/2013   CHOL 222* 09/24/2013   TRIG 268.0* 09/24/2013   HDL 49.20 09/24/2013   LDLDIRECT 101.7 10/29/2012   LDLCALC 119*  09/24/2013   ALT 18 09/24/2013   AST 21 09/24/2013   NA 139 09/24/2013   K 4.1 09/24/2013   CL 99 09/24/2013   CREATININE 1.1 09/24/2013   BUN 18 09/24/2013   CO2 29 09/24/2013   TSH 0.79 09/24/2013   INR 1.00 10/31/2010   HGBA1C 7.7* 09/24/2013        Assessment & Plan:

## 2014-04-07 NOTE — Progress Notes (Signed)
Pre visit review using our clinic review tool, if applicable. No additional management support is needed unless otherwise documented below in the visit note. 

## 2014-04-08 ENCOUNTER — Telehealth: Payer: Self-pay | Admitting: Internal Medicine

## 2014-04-08 DIAGNOSIS — R0609 Other forms of dyspnea: Secondary | ICD-10-CM

## 2014-04-08 MED ORDER — TRAMADOL HCL 50 MG PO TABS: 50.0000 mg | ORAL_TABLET | Freq: Four times a day (QID) | ORAL | Status: DC | PRN

## 2014-04-08 MED ORDER — DESONIDE 0.05 % EX LOTN: TOPICAL_LOTION | Freq: Two times a day (BID) | CUTANEOUS | Status: DC

## 2014-04-08 MED ORDER — CYCLOBENZAPRINE HCL 5 MG PO TABS: ORAL_TABLET | ORAL | Status: DC

## 2014-04-08 NOTE — Assessment & Plan Note (Signed)
Her blood sugars are well controlled 

## 2014-04-08 NOTE — Telephone Encounter (Signed)
Notified pt md sent ointment. Pt also stated she was suppose to get refills on her tramadol & flexeril as well. Pls advise...Andrea Wilkinson

## 2014-04-08 NOTE — Assessment & Plan Note (Signed)
Will treat with desowen lotion

## 2014-04-08 NOTE — Assessment & Plan Note (Signed)
Her exam is benign today Her EKG shows a very mild loss of voltage in V1 and V2 but the rest of the EKG is normal with no LVH or ST/T waves changes I am concerned that she may have CAD and she wishes to pursue this further so I have ordered a Lexiscan Will check her labs today to look for other causes If all this is normal then will attribute the DOE to aging and conditioning

## 2014-04-08 NOTE — Assessment & Plan Note (Signed)
Her BP is well controlled Lytes and renal function are stable 

## 2014-04-08 NOTE — Telephone Encounter (Signed)
Pt called in said that she checked with drug store and the meds that dr Ronnald Ramp was calling in for her face was not there.  It was a ointment for the breakout on her face.  She was not sure  the name of the med.  She was calling in to check on it.      Best number [phone]- home number

## 2014-04-08 NOTE — Assessment & Plan Note (Signed)
She will not take a statin 

## 2014-04-08 NOTE — Telephone Encounter (Signed)
Rx sent 

## 2014-04-08 NOTE — Telephone Encounter (Signed)
No ointment mention pls advise on msg ago...Andrea Wilkinson

## 2014-04-08 NOTE — Telephone Encounter (Signed)
F[facility]ed scripts to [facility] pt already notified...Andrea Wilkinson

## 2014-04-08 NOTE — Telephone Encounter (Signed)
done

## 2014-04-29 ENCOUNTER — Ambulatory Visit: Payer: Commercial Managed Care - HMO

## 2014-05-05 ENCOUNTER — Other Ambulatory Visit: Payer: Self-pay | Admitting: Internal Medicine

## 2014-05-05 DIAGNOSIS — T7840XA Allergy, unspecified, initial encounter: Secondary | ICD-10-CM

## 2014-05-05 MED ORDER — HYDROXYZINE HCL 50 MG PO TABS: 50.0000 mg | ORAL_TABLET | Freq: Three times a day (TID) | ORAL | Status: DC | PRN

## 2014-05-05 NOTE — Telephone Encounter (Signed)
Called pt back about medication. Patient stated that she did not get the lotion that was sent in for rash because the out of pocket cost was over $100. Patient stated that she was prescribed a tablet. Patient does not remember the name of med. Their was never a tablet prescribed to her for a rash.

## 2014-05-05 NOTE — Telephone Encounter (Signed)
Pt states it was a pill, Atarax 50 mg tablets.

## 2014-05-05 NOTE — Telephone Encounter (Signed)
Please advise refill for Atarax?

## 2014-05-05 NOTE — Telephone Encounter (Signed)
Pt has rash on arms, back & legs. Pt states she saw MD for this recently. Pt requesting refill of med that was given for this (however, she forgets the name of med) Pls advise.

## 2014-05-05 NOTE — Telephone Encounter (Signed)
I need to know what the cream is  T. Ronnald Ramp

## 2014-08-04 ENCOUNTER — Other Ambulatory Visit: Payer: Self-pay | Admitting: *Deleted

## 2014-08-04 MED ORDER — ENALAPRIL MALEATE 20 MG PO TABS
20.0000 mg | ORAL_TABLET | Freq: Every day | ORAL | Status: DC
Start: 2014-08-04 — End: 2014-08-24

## 2014-08-04 MED ORDER — METFORMIN HCL 1000 MG PO TABS: 1000.0000 mg | ORAL_TABLET | Freq: Two times a day (BID) | ORAL | Status: DC

## 2014-08-04 MED ORDER — GLIPIZIDE ER 2.5 MG PO TB24: 2.5000 mg | ORAL_TABLET | Freq: Every day | ORAL | Status: DC

## 2014-08-04 NOTE — Telephone Encounter (Signed)
Left msg on triage pt is needing new prescriptions on her enalapril, metformin. Sent refill to [facility].Marland KitchenJohny Chess

## 2014-08-12 ENCOUNTER — Ambulatory Visit: Payer: Commercial Managed Care - HMO | Admitting: Internal Medicine

## 2014-08-17 ENCOUNTER — Ambulatory Visit (INDEPENDENT_AMBULATORY_CARE_PROVIDER_SITE_OTHER): Payer: Commercial Managed Care - HMO | Admitting: Internal Medicine

## 2014-08-17 ENCOUNTER — Encounter: Payer: Self-pay | Admitting: Internal Medicine

## 2014-08-17 ENCOUNTER — Other Ambulatory Visit (INDEPENDENT_AMBULATORY_CARE_PROVIDER_SITE_OTHER): Payer: Commercial Managed Care - HMO

## 2014-08-17 VITALS — BP 128/80 | HR 94 | Temp 98.1°F | Resp 16 | Ht 60.0 in | Wt 167.0 lb

## 2014-08-17 DIAGNOSIS — E118 Type 2 diabetes mellitus with unspecified complications: Secondary | ICD-10-CM

## 2014-08-17 DIAGNOSIS — I10 Essential (primary) hypertension: Secondary | ICD-10-CM

## 2014-08-17 DIAGNOSIS — M47816 Spondylosis without myelopathy or radiculopathy, lumbar region: Secondary | ICD-10-CM

## 2014-08-17 DIAGNOSIS — Z23 Encounter for immunization: Secondary | ICD-10-CM

## 2014-08-17 DIAGNOSIS — K589 Irritable bowel syndrome without diarrhea: Secondary | ICD-10-CM | POA: Diagnosis not present

## 2014-08-17 LAB — URINALYSIS, ROUTINE W REFLEX MICROSCOPIC
Bilirubin Urine: NEGATIVE
Hgb urine dipstick: NEGATIVE
Ketones, ur: NEGATIVE
Nitrite: NEGATIVE
RBC / HPF: NONE SEEN (ref 0–?)
SPECIFIC GRAVITY, URINE: 1.01 (ref 1.000–1.030)
Total Protein, Urine: NEGATIVE
Urine Glucose: NEGATIVE
Urobilinogen, UA: 0.2 (ref 0.0–1.0)
pH: 5 (ref 5.0–8.0)

## 2014-08-17 LAB — BASIC METABOLIC PANEL
BUN: 16 mg/dL (ref 6–23)
CHLORIDE: 100 meq/L (ref 96–112)
CO2: 28 meq/L (ref 19–32)
Calcium: 10.2 mg/dL (ref 8.4–10.5)
Creatinine, Ser: 1.13 mg/dL (ref 0.40–1.20)
GFR: 49.13 mL/min — ABNORMAL LOW (ref >60.00)
GLUCOSE: 156 mg/dL — AB (ref 70–99)
POTASSIUM: 4.5 meq/L (ref 3.5–5.1)
Sodium: 140 mEq/L (ref 135–145)

## 2014-08-17 LAB — MICROALBUMIN / CREATININE URINE RATIO
Creatinine,U: 34.6 mg/dL
MICROALB/CREAT RATIO: 2.3 mg/g (ref 0.0–30.0)
Microalb, Ur: 0.8 mg/dL (ref 0.0–1.9)

## 2014-08-17 LAB — HEMOGLOBIN A1C: Hgb A1c MFr Bld: 8.4 % — ABNORMAL HIGH (ref 4.6–6.5)

## 2014-08-17 MED ORDER — ELUXADOLINE 75 MG PO TABS: 1.0000 | ORAL_TABLET | Freq: Two times a day (BID) | ORAL | Status: DC

## 2014-08-17 MED ORDER — TIZANIDINE HCL 2 MG PO TABS: 2.0000 mg | ORAL_TABLET | Freq: Four times a day (QID) | ORAL | Status: DC | PRN

## 2014-08-17 MED ORDER — PB-HYOSCY-ATROPINE-SCOPOLAMINE 16.2 MG PO TABS: 1.0000 | ORAL_TABLET | Freq: Three times a day (TID) | ORAL | Status: DC | PRN

## 2014-08-17 NOTE — Assessment & Plan Note (Signed)
Her BP is well controlled Will monitor her lytes and renal function 

## 2014-08-17 NOTE — Assessment & Plan Note (Signed)
Her insurance will not cover flexeril, will change to tizanidine Cont mobic and tramadol as well

## 2014-08-17 NOTE — Assessment & Plan Note (Signed)
Her blood sugars have been relatively well controlled Will recheck her A1C and will advise further, will also monitor her renal fucntion

## 2014-08-17 NOTE — Progress Notes (Signed)
Subjective:    Patient ID: Andrea Wilkinson, female    DOB: April 12, 1932, 81 y.o.   MRN: [id number]  Diabetes She presents for her follow-up diabetic visit. She has type 2 diabetes mellitus. Her disease course has been stable. There are no hypoglycemic associated symptoms. Pertinent negatives for hypoglycemia include no dizziness. There are no diabetic associated symptoms. Pertinent negatives for diabetes include no blurred vision, no chest pain, no fatigue, no foot paresthesias, no foot ulcerations, no polydipsia, no polyphagia, no polyuria, no visual change, no weakness and no weight loss. There are no hypoglycemic complications. There are no diabetic complications. Current diabetic treatment includes oral agent (dual therapy). She is compliant with treatment all of the time. Her weight is stable. She is following a generally healthy diet. Meal planning includes avoidance of concentrated sweets. She never participates in exercise. An ACE inhibitor/angiotensin II receptor blocker is being taken. She does not see a podiatrist.Eye exam is current.      Review of Systems  Constitutional: Negative.  Negative for fever, chills, weight loss, diaphoresis, appetite change and fatigue.  HENT: Negative.  Negative for trouble swallowing and voice change.   Eyes: Negative.  Negative for blurred vision.  Respiratory: Negative.  Negative for cough, choking, chest tightness, shortness of breath and stridor.   Cardiovascular: Negative.  Negative for chest pain, palpitations and leg swelling.  Gastrointestinal: Positive for diarrhea. Negative for nausea, vomiting, abdominal pain, constipation, blood in stool, abdominal distention and rectal pain.       She has chronic abd symptoms with queazy feeling, diarrhea with urgency, cramping, and bloating, imodium helps but she wants to try something else to help with her symptoms.  Endocrine: Negative.  Negative for polydipsia, polyphagia and polyuria.  Genitourinary:  Negative.   Musculoskeletal: Positive for back pain and arthralgias. Negative for myalgias, joint swelling and gait problem.  Skin: Negative.   Allergic/Immunologic: Negative.   Neurological: Negative.  Negative for dizziness and weakness.  Hematological: Negative.  Negative for adenopathy. Does not bruise/bleed easily.  Psychiatric/Behavioral: Negative.        Objective:   Physical Exam  Constitutional: She is oriented to person, place, and time. She appears well-developed and well-nourished. No distress.  HENT:  Head: Normocephalic and atraumatic.  Mouth/Throat: Oropharynx is clear and moist. No oropharyngeal exudate.  Eyes: Conjunctivae are normal. Right eye exhibits no discharge. Left eye exhibits no discharge. No scleral icterus.  Neck: Normal range of motion. Neck supple. No JVD present. No tracheal deviation present. No thyromegaly present.  Cardiovascular: Normal rate, regular rhythm, normal heart sounds and intact distal pulses.  Exam reveals no gallop and no friction rub.   No murmur heard. Pulmonary/Chest: Effort normal and breath sounds normal. No stridor. No respiratory distress. She has no wheezes. She has no rales. She exhibits no tenderness.  Abdominal: Soft. Bowel sounds are normal. She exhibits no distension and no mass. There is no tenderness. There is no rebound and no guarding.  Musculoskeletal: Normal range of motion. She exhibits no edema or tenderness.  Lymphadenopathy:    She has no cervical adenopathy.  Neurological: She is oriented to person, place, and time.  Skin: Skin is warm and dry. No rash noted. She is not diaphoretic. No erythema. No pallor.  Vitals reviewed.    Lab Results  Component Value Date   WBC 9.1 04/07/2014   HGB 13.5 04/07/2014   HCT 41.1 04/07/2014   PLT 272.0 04/07/2014   GLUCOSE 247* 04/07/2014   CHOL 207*  04/07/2014   TRIG 203.0* 04/07/2014   HDL 54.40 04/07/2014   LDLDIRECT 131.4 04/07/2014   LDLCALC 119* 09/24/2013   ALT 14  04/07/2014   AST 18 04/07/2014   NA 137 04/07/2014   K 4.3 04/07/2014   CL 100 04/07/2014   CREATININE 1.1 04/07/2014   BUN 18 04/07/2014   CO2 28 04/07/2014   TSH 0.95 04/07/2014   INR 1.00 10/31/2010   HGBA1C 7.6* 04/07/2014       Assessment & Plan:

## 2014-08-17 NOTE — Patient Instructions (Signed)
Irritable Bowel Syndrome Irritable bowel syndrome (IBS) is caused by a disturbance of normal bowel function and is a common digestive disorder. You may also hear this condition called spastic colon, mucous colitis, and irritable colon. There is no cure for IBS. However, symptoms often gradually improve or disappear with a good diet, stress management, and medicine. This condition usually appears in late adolescence or early adulthood. Women develop it twice as often as men. CAUSES  After food has been digested and absorbed in the small intestine, waste material is moved into the large intestine, or colon. In the colon, water and salts are absorbed from the undigested products coming from the small intestine. The remaining residue, or fecal material, is held for elimination. Under normal circumstances, gentle, rhythmic contractions of the bowel walls push the fecal material along the colon toward the rectum. In IBS, however, these contractions are irregular and poorly coordinated. The fecal material is either retained too long, resulting in constipation, or expelled too soon, producing diarrhea. SIGNS AND SYMPTOMS  The most common symptom of IBS is abdominal pain. It is often in the lower left side of the abdomen, but it may occur anywhere in the abdomen. The pain comes from spasms of the bowel muscles happening too much and from the buildup of gas and fecal material in the colon. This pain:  Can range from sharp abdominal cramps to a dull, continuous ache.  Often worsens soon after eating.  Is often relieved by having a bowel movement or passing gas. Abdominal pain is usually accompanied by constipation, but it may also produce diarrhea. The diarrhea often occurs right after a meal or upon waking up in the morning. The stools are often soft, watery, and flecked with mucus. Other symptoms of IBS include:  Bloating.  Loss of appetite.  Heartburn.  Backache.  Dull pain in the arms or  shoulders.  Nausea.  Burping.  Vomiting.  Gas. IBS may also cause symptoms that are unrelated to the digestive system, such as:  Fatigue.  Headaches.  Anxiety.  Shortness of breath.  Trouble concentrating.  Dizziness. These symptoms tend to come and go. DIAGNOSIS  The symptoms of IBS may seem like symptoms of other, more serious digestive disorders. Your health care provider may want to perform tests to exclude these disorders.  TREATMENT Many medicines are available to help correct bowel function or relieve bowel spasms and abdominal pain. Among the medicines available are:  Laxatives for severe constipation and to help restore normal bowel habits.  Specific antidiarrheal medicines to treat severe or lasting diarrhea.  Antispasmodic agents to relieve intestinal cramps. Your health care provider may also decide to treat you with a mild tranquilizer or sedative during unusually stressful periods in your life. Your health care provider may also prescribe antidepressant medicine. The use of this medicine has been shown to reduce pain and other symptoms of IBS. Remember that if any medicine is prescribed for you, you should take it exactly as directed. Make sure your health care provider knows how well it worked for you. HOME CARE INSTRUCTIONS   Take all medicines as directed by your health care provider.  Avoid foods that are high in fat or oils, such as heavy cream, butter, frankfurters, sausage, and other fatty meats.  Avoid foods that make you go to the bathroom, such as fruit, fruit juice, and dairy products.  Cut out carbonated drinks, chewing gum, and "gassy" foods such as beans and cabbage. This may help relieve bloating and burping.    Eat foods with bran, and drink plenty of liquids with the bran foods. This helps relieve constipation.  Keep track of what foods seem to bring on your symptoms.  Avoid emotionally charged situations or circumstances that produce  anxiety.  Start or continue exercising.  Get plenty of rest and sleep. Document Released: 06/05/2005 Document Revised: 06/10/2013 Document Reviewed: 01/24/2008 ExitCare Patient Information 2015 [employer]. This information is not intended to replace advice given to you by your health care provider. Make sure you discuss any questions you have with your health care provider.  

## 2014-08-17 NOTE — Progress Notes (Signed)
Pre visit review using our clinic review tool, if applicable. No additional management support is needed unless otherwise documented below in the visit note. 

## 2014-08-17 NOTE — Assessment & Plan Note (Signed)
Will try Viberzi for this, she wants to try belladonna/PB as well

## 2014-08-19 ENCOUNTER — Telehealth: Payer: Self-pay | Admitting: Internal Medicine

## 2014-08-19 DIAGNOSIS — K589 Irritable bowel syndrome without diarrhea: Secondary | ICD-10-CM

## 2014-08-19 MED ORDER — PB-HYOSCY-ATROPINE-SCOPOLAMINE 16.2 MG/5ML PO ELIX: 5.0000 mL | ORAL_SOLUTION | Freq: Three times a day (TID) | ORAL | Status: DC | PRN

## 2014-08-19 NOTE — Telephone Encounter (Signed)
pls fax in the new Rx

## 2014-08-19 NOTE — Telephone Encounter (Signed)
Pt called stated [facility] pharmacy does not carrie belladona alk-PHENObarbital (DONNATAL) 16.2 MG, please send in something else to [facility] [place] [facility] on [address]. Please advise.

## 2014-08-19 NOTE — Telephone Encounter (Signed)
Done

## 2014-08-20 ENCOUNTER — Telehealth: Payer: Self-pay | Admitting: Internal Medicine

## 2014-08-20 DIAGNOSIS — M47816 Spondylosis without myelopathy or radiculopathy, lumbar region: Secondary | ICD-10-CM

## 2014-08-20 DIAGNOSIS — M159 Polyosteoarthritis, unspecified: Secondary | ICD-10-CM

## 2014-08-20 DIAGNOSIS — M150 Primary generalized (osteo)arthritis: Principal | ICD-10-CM

## 2014-08-20 MED ORDER — TRAMADOL HCL 50 MG PO TABS: 50.0000 mg | ORAL_TABLET | Freq: Four times a day (QID) | ORAL | Status: DC | PRN

## 2014-08-20 NOTE — Telephone Encounter (Signed)
Pt request refill for traMADol (ULTRAM) 50 MG tablet for 3 month supply, please send to [facility] on [address] if possible.

## 2014-08-20 NOTE — Telephone Encounter (Signed)
done

## 2014-08-24 ENCOUNTER — Telehealth: Payer: Self-pay | Admitting: Internal Medicine

## 2014-08-24 MED ORDER — ENALAPRIL MALEATE 20 MG PO TABS: 20.0000 mg | ORAL_TABLET | Freq: Every day | ORAL | Status: DC

## 2014-08-24 MED ORDER — GLIPIZIDE ER 2.5 MG PO TB24: 2.5000 mg | ORAL_TABLET | Freq: Every day | ORAL | Status: DC

## 2014-08-24 MED ORDER — METFORMIN HCL 1000 MG PO TABS: 1000.0000 mg | ORAL_TABLET | Freq: Two times a day (BID) | ORAL | Status: DC

## 2014-08-24 NOTE — Telephone Encounter (Signed)
Pt called in said that all her meds are suppose to be called in for 3 months.  She is having to pay high prices because the me meds are only call in for 30days.  She wants to know if she could get a call back from nurse to talk about her meds

## 2014-08-25 NOTE — Telephone Encounter (Signed)
Called [facility] spoke with Sharyn Lull who advise rx recived and ready for pick up. Pt notified//[email or web address]

## 2014-08-25 NOTE — Telephone Encounter (Signed)
All med request have been sent to [facility] via e-script and faxed. Per triage, pt checking to see if Tr[facility] was [facility] per [facility] until 9[facility], will try again later.

## 2014-11-12 ENCOUNTER — Ambulatory Visit (INDEPENDENT_AMBULATORY_CARE_PROVIDER_SITE_OTHER): Payer: Commercial Managed Care - HMO | Admitting: Ophthalmology

## 2014-11-12 DIAGNOSIS — H35033 Hypertensive retinopathy, bilateral: Secondary | ICD-10-CM | POA: Diagnosis not present

## 2014-11-12 DIAGNOSIS — H35372 Puckering of macula, left eye: Secondary | ICD-10-CM

## 2014-11-12 DIAGNOSIS — I10 Essential (primary) hypertension: Secondary | ICD-10-CM | POA: Diagnosis not present

## 2014-11-12 DIAGNOSIS — E11319 Type 2 diabetes mellitus with unspecified diabetic retinopathy without macular edema: Secondary | ICD-10-CM | POA: Diagnosis not present

## 2014-11-12 DIAGNOSIS — H3531 Nonexudative age-related macular degeneration: Secondary | ICD-10-CM | POA: Diagnosis not present

## 2014-11-12 DIAGNOSIS — E11329 Type 2 diabetes mellitus with mild nonproliferative diabetic retinopathy without macular edema: Secondary | ICD-10-CM | POA: Diagnosis not present

## 2014-11-18 ENCOUNTER — Other Ambulatory Visit: Payer: Self-pay

## 2014-11-18 MED ORDER — OMEPRAZOLE 20 MG PO CPDR: 20.0000 mg | DELAYED_RELEASE_CAPSULE | Freq: Every day | ORAL | Status: DC

## 2014-11-20 DIAGNOSIS — E11329 Type 2 diabetes mellitus with mild nonproliferative diabetic retinopathy without macular edema: Secondary | ICD-10-CM | POA: Diagnosis not present

## 2014-11-20 DIAGNOSIS — Z961 Presence of intraocular lens: Secondary | ICD-10-CM | POA: Diagnosis not present

## 2014-11-20 DIAGNOSIS — H35372 Puckering of macula, left eye: Secondary | ICD-10-CM | POA: Diagnosis not present

## 2014-11-20 DIAGNOSIS — H35361 Drusen (degenerative) of macula, right eye: Secondary | ICD-10-CM | POA: Diagnosis not present

## 2014-11-20 DIAGNOSIS — D3132 Benign neoplasm of left choroid: Secondary | ICD-10-CM | POA: Diagnosis not present

## 2014-11-20 DIAGNOSIS — H3531 Nonexudative age-related macular degeneration: Secondary | ICD-10-CM | POA: Diagnosis not present

## 2014-11-20 DIAGNOSIS — H26493 Other secondary cataract, bilateral: Secondary | ICD-10-CM | POA: Diagnosis not present

## 2014-11-20 DIAGNOSIS — H04123 Dry eye syndrome of bilateral lacrimal glands: Secondary | ICD-10-CM | POA: Diagnosis not present

## 2014-11-20 LAB — HM DIABETES EYE EXAM

## 2014-12-02 ENCOUNTER — Other Ambulatory Visit: Payer: Self-pay

## 2014-12-02 DIAGNOSIS — M150 Primary generalized (osteo)arthritis: Principal | ICD-10-CM

## 2014-12-02 DIAGNOSIS — M47816 Spondylosis without myelopathy or radiculopathy, lumbar region: Secondary | ICD-10-CM

## 2014-12-02 DIAGNOSIS — M159 Polyosteoarthritis, unspecified: Secondary | ICD-10-CM

## 2014-12-02 MED ORDER — TRAMADOL HCL 50 MG PO TABS: 50.0000 mg | ORAL_TABLET | Freq: Four times a day (QID) | ORAL | Status: DC | PRN

## 2014-12-02 MED ORDER — OMEPRAZOLE 20 MG PO CPDR: 20.0000 mg | DELAYED_RELEASE_CAPSULE | Freq: Every day | ORAL | Status: DC

## 2014-12-04 DIAGNOSIS — H26491 Other secondary cataract, right eye: Secondary | ICD-10-CM | POA: Diagnosis not present

## 2014-12-10 ENCOUNTER — Telehealth: Payer: Self-pay

## 2014-12-10 DIAGNOSIS — M47816 Spondylosis without myelopathy or radiculopathy, lumbar region: Secondary | ICD-10-CM

## 2014-12-10 DIAGNOSIS — M159 Polyosteoarthritis, unspecified: Secondary | ICD-10-CM

## 2014-12-10 DIAGNOSIS — M150 Primary generalized (osteo)arthritis: Secondary | ICD-10-CM

## 2014-12-10 MED ORDER — MELOXICAM 15 MG PO TABS: 15.0000 mg | ORAL_TABLET | Freq: Every day | ORAL | Status: DC

## 2014-12-10 NOTE — Telephone Encounter (Signed)
done

## 2014-12-10 NOTE — Telephone Encounter (Signed)
Patient called to follow3 up on request. Advised that we have received and it is pending dr Ronnald Ramp approval. She requests a call to advise her either way today

## 2014-12-10 NOTE — Telephone Encounter (Signed)
Patient notified

## 2014-12-10 NOTE — Addendum Note (Signed)
Addended by: Janith Lima on: 12/10/2014 12:10 PM   Modules accepted: Orders

## 2014-12-10 NOTE — Telephone Encounter (Signed)
Received refill request from [facility]   request refills for Meloxicam 15 mg tablet. Rx last written 08/25/2013 and pt last seen 08/17/2014   .

## 2014-12-16 ENCOUNTER — Ambulatory Visit (INDEPENDENT_AMBULATORY_CARE_PROVIDER_SITE_OTHER): Payer: Commercial Managed Care - HMO | Admitting: Internal Medicine

## 2014-12-16 VITALS — BP 126/74 | HR 93 | Temp 98.3°F | Resp 16 | Ht 60.0 in | Wt 162.0 lb

## 2014-12-16 DIAGNOSIS — I10 Essential (primary) hypertension: Secondary | ICD-10-CM | POA: Diagnosis not present

## 2014-12-16 DIAGNOSIS — L239 Allergic contact dermatitis, unspecified cause: Secondary | ICD-10-CM

## 2014-12-16 DIAGNOSIS — L200 Besnier's prurigo: Secondary | ICD-10-CM | POA: Diagnosis not present

## 2014-12-16 MED ORDER — TRIAMCINOLONE ACETONIDE 0.5 % EX CREA: 1.0000 "application " | TOPICAL_CREAM | Freq: Three times a day (TID) | CUTANEOUS | Status: DC

## 2014-12-16 NOTE — Progress Notes (Signed)
Pre visit review using our clinic review tool, if applicable. No additional management support is needed unless otherwise documented below in the visit note. 

## 2014-12-16 NOTE — Patient Instructions (Signed)
Eczema Eczema, also called atopic dermatitis, is a skin disorder that causes inflammation of the skin. It causes a red rash and dry, scaly skin. The skin becomes very itchy. Eczema is generally worse during the cooler winter months and often improves with the warmth of summer. Eczema usually starts showing signs in infancy. Some children outgrow eczema, but it may last through adulthood.  CAUSES  The exact cause of eczema is not known, but it appears to run in families. People with eczema often have a family history of eczema, allergies, asthma, or hay fever. Eczema is not contagious. Flare-ups of the condition may be caused by:   Contact with something you are sensitive or allergic to.   Stress. SIGNS AND SYMPTOMS  Dry, scaly skin.   Red, itchy rash.   Itchiness. This may occur before the skin rash and may be very intense.  DIAGNOSIS  The diagnosis of eczema is usually made based on symptoms and medical history. TREATMENT  Eczema cannot be cured, but symptoms usually can be controlled with treatment and other strategies. A treatment plan might include:  Controlling the itching and scratching.   Use over-the-counter antihistamines as directed for itching. This is especially useful at night when the itching tends to be worse.   Use over-the-counter steroid creams as directed for itching.   Avoid scratching. Scratching makes the rash and itching worse. It may also result in a skin infection (impetigo) due to a break in the skin caused by scratching.   Keeping the skin well moisturized with creams every day. This will seal in moisture and help prevent dryness. Lotions that contain alcohol and water should be avoided because they can dry the skin.   Limiting exposure to things that you are sensitive or allergic to (allergens).   Recognizing situations that cause stress.   Developing a plan to manage stress.  HOME CARE INSTRUCTIONS   Only take over-the-counter or  prescription medicines as directed by your health care provider.   Do not use anything on the skin without checking with your health care provider.   Keep baths or showers short (5 minutes) in warm (not hot) water. Use mild cleansers for bathing. These should be unscented. You may add nonperfumed bath oil to the bath water. It is best to avoid soap and bubble bath.   Immediately after a bath or shower, when the skin is still damp, apply a moisturizing ointment to the entire body. This ointment should be a petroleum ointment. This will seal in moisture and help prevent dryness. The thicker the ointment, the better. These should be unscented.   Keep fingernails cut short. Children with eczema may need to wear soft gloves or mittens at night after applying an ointment.   Dress in clothes made of cotton or cotton blends. Dress lightly, because heat increases itching.   A child with eczema should stay away from anyone with fever blisters or cold sores. The virus that causes fever blisters (herpes simplex) can cause a serious skin infection in children with eczema. SEEK MEDICAL CARE IF:   Your itching interferes with sleep.   Your rash gets worse or is not better within 1 week after starting treatment.   You see pus or soft yellow scabs in the rash area.   You have a fever.   You have a rash flare-up after contact with someone who has fever blisters.  Document Released: 06/02/2000 Document Revised: 03/26/2013 Document Reviewed: 01/06/2013 ExitCare Patient Information 2015 [employer]. This information   is not intended to replace advice given to you by your health care provider. Make sure you discuss any questions you have with your health care provider.  

## 2014-12-17 ENCOUNTER — Encounter: Payer: Self-pay | Admitting: Internal Medicine

## 2014-12-17 NOTE — Assessment & Plan Note (Signed)
Will add TAC cream to the atarax for symptom relief

## 2014-12-17 NOTE — Assessment & Plan Note (Signed)
Her BP is well controlled 

## 2014-12-17 NOTE — Progress Notes (Signed)
Subjective:  Patient ID: Andrea Wilkinson, female    DOB: Jun 09, 1932  Age: 81 y.o. MRN: [id number]  CC: Rash   HPI Neiva Maenza Frazee presents for recurrent itchy rash on her arms and legs, she has been using an OTC itch cream without much relief. Atarax helps for the itch but the rash persists.  Outpatient Prescriptions Prior to Visit  Medication Sig Dispense Refill  . belladonna-PHENObarbital (DONNATAL) 16.2 MG/5ML ELIX Take 5 mLs (16.2 mg total) by mouth 3 (three) times daily as needed for cramping. 600 mL 2  . Blood Glucose Monitoring Suppl (ACCU-CHEK AVIVA PLUS) W/DEVICE KIT Use to check blood sugars daily 1 kit 0  . Calcium Carbonate-Vitamin D 600-400 MG-UNIT per tablet Take 1 tablet by mouth daily.    . clotrimazole (MYCELEX) 10 MG troche Take 1 lozenge (10 mg total) by mouth 3 (three) times daily. 25 lozenge 3  . desonide (DESOWEN) 0.05 % lotion Apply topically 2 (two) times daily. 59 mL 2  . Eluxadoline (VIBERZI) 75 MG TABS Take 1 tablet by mouth 2 (two) times daily. 180 tablet 1  . enalapril (VASOTEC) 20 MG tablet Take 1 tablet (20 mg total) by mouth daily. 90 tablet 3  . furosemide (LASIX) 20 MG tablet Take 1 tablet (20 mg total) by mouth daily. 90 tablet 3  . glipiZIDE (GLIPIZIDE XL) 2.5 MG 24 hr tablet Take 1 tablet (2.5 mg total) by mouth daily. 90 tablet 3  . glucose blood (ACCU-CHEK AVIVA PLUS) test strip Use to check blood sugar twice a day Dx 250.00 180 each 3  . glucose blood (ACCU-CHEK COMPACT TEST DRUM) test strip Use as instructed 100 each 12  . hydrOXYzine (ATARAX/VISTARIL) 50 MG tablet Take 1 tablet (50 mg total) by mouth 3 (three) times daily as needed. 30 tablet 3  . Lancets (ACCU-CHEK MULTICLIX) lancets Use to help check blood sugars twice a day Dx 250.00 180 each 3  . meloxicam (MOBIC) 15 MG tablet Take 1 tablet (15 mg total) by mouth daily. 90 tablet 1  . metFORMIN (GLUCOPHAGE) 1000 MG tablet Take 1 tablet (1,000 mg total) by mouth 2 (two) times daily with a meal.  180 tablet 3  . omeprazole (PRILOSEC) 20 MG capsule Take 1 capsule (20 mg total) by mouth daily. 90 capsule 3  . oxybutynin (DITROPAN-XL) 5 MG 24 hr tablet Take 1 tablet (5 mg total) by mouth daily. 90 tablet 3  . tiZANidine (ZANAFLEX) 2 MG tablet Take 1 tablet (2 mg total) by mouth every 6 (six) hours as needed for muscle spasms. 120 tablet 3  . traMADol (ULTRAM) 50 MG tablet Take 1 tablet (50 mg total) by mouth every 6 (six) hours as needed. 360 tablet 1   No facility-administered medications prior to visit.    ROS Review of Systems  Constitutional: Negative.  Negative for fever, chills, diaphoresis, appetite change and fatigue.  HENT: Negative.   Eyes: Negative.   Respiratory: Negative.  Negative for cough, choking, chest tightness, shortness of breath and stridor.   Cardiovascular: Negative.  Negative for chest pain, palpitations and leg swelling.  Gastrointestinal: Negative.  Negative for nausea, vomiting, abdominal pain, diarrhea and constipation.  Endocrine: Negative.   Genitourinary: Negative.   Musculoskeletal: Positive for back pain and arthralgias.  Skin: Positive for rash.  Allergic/Immunologic: Negative.   Neurological: Negative.   Hematological: Negative.  Negative for adenopathy. Does not bruise/bleed easily.  Psychiatric/Behavioral: Negative.     Objective:  BP 126/74 mmHg  Pulse 93  Temp(Src) 98.3 F (36.8 C) (Oral)  Ht 5' (1.524 m)  Wt 162 lb (73.483 kg)  BMI 31.64 kg/m2  SpO2 96%  BP Readings from Last 3 Encounters:  12/16/14 126/74  08/17/14 128/80  04/07/14 122/66    Wt Readings from Last 3 Encounters:  12/16/14 162 lb (73.483 kg)  08/17/14 167 lb (75.751 kg)  04/07/14 164 lb (74.39 kg)    Physical Exam  Constitutional: She is oriented to person, place, and time. No distress.  HENT:  Mouth/Throat: Oropharynx is clear and moist. No oropharyngeal exudate.  Eyes: Conjunctivae are normal. Right eye exhibits no discharge. Left eye exhibits no  discharge. No scleral icterus.  Neck: Normal range of motion. Neck supple. No JVD present. No tracheal deviation present. No thyromegaly present.  Cardiovascular: Normal rate, regular rhythm, normal heart sounds and intact distal pulses.  Exam reveals no gallop and no friction rub.   No murmur heard. Pulmonary/Chest: Effort normal and breath sounds normal. No stridor. No respiratory distress. She has no wheezes. She has no rales. She exhibits no tenderness.  Abdominal: Soft. Bowel sounds are normal. She exhibits no distension and no mass. There is no tenderness. There is no rebound and no guarding.  Musculoskeletal: Normal range of motion. She exhibits no edema or tenderness.  Lymphadenopathy:    She has no cervical adenopathy.  Neurological: She is oriented to person, place, and time.  Skin: Skin is warm, dry and intact. Purpura and rash noted. No abrasion, no bruising, no burn, no ecchymosis, no laceration, no lesion and no petechiae noted. Rash is not macular, not papular, not maculopapular, not nodular, not pustular, not vesicular and not urticarial. She is not diaphoretic. No cyanosis or erythema. No pallor. Nails show no clubbing.     There are scattered areas of papules that have coalesced, there is mild erythema and scale  Psychiatric: She has a normal mood and affect. Her behavior is normal. Judgment and thought content normal.    Lab Results  Component Value Date   WBC 9.1 04/07/2014   HGB 13.5 04/07/2014   HCT 41.1 04/07/2014   PLT 272.0 04/07/2014   GLUCOSE 156* 08/17/2014   CHOL 207* 04/07/2014   TRIG 203.0* 04/07/2014   HDL 54.40 04/07/2014   LDLDIRECT 131.4 04/07/2014   LDLCALC 119* 09/24/2013   ALT 14 04/07/2014   AST 18 04/07/2014   NA 140 08/17/2014   K 4.5 08/17/2014   CL 100 08/17/2014   CREATININE 1.13 08/17/2014   BUN 16 08/17/2014   CO2 28 08/17/2014   TSH 0.95 04/07/2014   INR 1.00 10/31/2010   HGBA1C 8.4* 08/17/2014   MICROALBUR 0.8 08/17/2014    Dg  Abd Acute W/chest  10/14/2013   CLINICAL DATA:  Abdominal pain and pressure  EXAM: ACUTE ABDOMEN SERIES (ABDOMEN 2 VIEW & CHEST 1 VIEW)  COMPARISON:  Chest radiograph June 17, 2010; CT abdomen March 08, 2005  FINDINGS: PA chest: There is mild scarring in the left base, stable. There is no edema or consolidation. The heart size and pulmonary vascularity are normal. No adenopathy. There old rib fractures as well as an old clavicle fracture on the right, stable.  Supine and upright abdomen: There is diffuse stool throughout the colon. Overall, the bowel gas pattern is unremarkable. No obstruction or free air. There are multiple surgical clips in the right upper abdomen. There is arthropathy in both hip joints. There is degenerative change in the lumbar spine with levoscoliosis. There is atherosclerotic calcification in  the aorta.  IMPRESSION: Bowel gas pattern unremarkable. Fairly diffuse stool in colon. Atherosclerotic change noted. No edema or consolidation.   Electronically Signed   By: Lowella Grip M.D.   On: 10/14/2013 13:54    Assessment & Plan:   Krystine was seen today for rash.  Diagnoses and all orders for this visit:  Eczema, allergic Orders: -     triamcinolone cream (KENALOG) 0.5 %; Apply 1 application topically 3 (three) times daily.   I am having Ms. Hoopingarner start on triamcinolone cream. I am also having her maintain her Calcium Carbonate-Vitamin D, glucose blood, oxybutynin, furosemide, clotrimazole, ACCU-CHEK AVIVA PLUS, glucose blood, accu-chek multiclix, desonide, hydrOXYzine, tiZANidine, Eluxadoline, belladonna-PHENObarbital, enalapril, glipiZIDE, metFORMIN, omeprazole, traMADol, and meloxicam.  Meds ordered this encounter  Medications  . triamcinolone cream (KENALOG) 0.5 %    Sig: Apply 1 application topically 3 (three) times daily.    Dispense:  100 g    Refill:  3     Follow-up: Return in about 2 months (around 02/15/2015).  Scarlette Calico, MD

## 2015-01-04 ENCOUNTER — Telehealth: Payer: Self-pay | Admitting: Internal Medicine

## 2015-01-04 NOTE — Telephone Encounter (Signed)
Pt called in and needs refill on her traMADol (ULTRAM) 50 MG tablet [[id number]]  Sent to her mail order pharmacy

## 2015-01-04 NOTE — Telephone Encounter (Signed)
Faxed to [facility] mail order   traMADol (ULTRAM) 50 MG tablet 360 tablet 1 12/02/2014      Sig - Route: Take 1 tablet (50 mg total) by mouth every 6 (six) hours as needed. - Oral     Class: Print

## 2015-01-20 DIAGNOSIS — L509 Urticaria, unspecified: Secondary | ICD-10-CM | POA: Diagnosis not present

## 2015-03-17 ENCOUNTER — Other Ambulatory Visit: Payer: Self-pay | Admitting: Internal Medicine

## 2015-03-31 ENCOUNTER — Other Ambulatory Visit: Payer: Self-pay | Admitting: Internal Medicine

## 2015-04-30 ENCOUNTER — Other Ambulatory Visit: Payer: Self-pay

## 2015-04-30 DIAGNOSIS — M150 Primary generalized (osteo)arthritis: Secondary | ICD-10-CM

## 2015-04-30 DIAGNOSIS — M159 Polyosteoarthritis, unspecified: Secondary | ICD-10-CM

## 2015-04-30 DIAGNOSIS — M47816 Spondylosis without myelopathy or radiculopathy, lumbar region: Secondary | ICD-10-CM

## 2015-04-30 MED ORDER — MELOXICAM 15 MG PO TABS: 15.0000 mg | ORAL_TABLET | Freq: Every day | ORAL | Status: DC

## 2015-05-05 ENCOUNTER — Ambulatory Visit (INDEPENDENT_AMBULATORY_CARE_PROVIDER_SITE_OTHER): Payer: Commercial Managed Care - HMO | Admitting: Internal Medicine

## 2015-05-05 ENCOUNTER — Encounter: Payer: Self-pay | Admitting: Internal Medicine

## 2015-05-05 ENCOUNTER — Telehealth: Payer: Self-pay

## 2015-05-05 ENCOUNTER — Other Ambulatory Visit (INDEPENDENT_AMBULATORY_CARE_PROVIDER_SITE_OTHER): Payer: Commercial Managed Care - HMO

## 2015-05-05 VITALS — BP 148/86 | HR 83 | Temp 97.9°F | Resp 16 | Ht 60.0 in | Wt 154.0 lb

## 2015-05-05 DIAGNOSIS — Z0000 Encounter for general adult medical examination without abnormal findings: Secondary | ICD-10-CM | POA: Insufficient documentation

## 2015-05-05 DIAGNOSIS — M150 Primary generalized (osteo)arthritis: Secondary | ICD-10-CM

## 2015-05-05 DIAGNOSIS — M159 Polyosteoarthritis, unspecified: Secondary | ICD-10-CM

## 2015-05-05 DIAGNOSIS — I10 Essential (primary) hypertension: Secondary | ICD-10-CM | POA: Diagnosis not present

## 2015-05-05 DIAGNOSIS — M47816 Spondylosis without myelopathy or radiculopathy, lumbar region: Secondary | ICD-10-CM

## 2015-05-05 DIAGNOSIS — E118 Type 2 diabetes mellitus with unspecified complications: Secondary | ICD-10-CM

## 2015-05-05 DIAGNOSIS — Z23 Encounter for immunization: Secondary | ICD-10-CM

## 2015-05-05 DIAGNOSIS — E785 Hyperlipidemia, unspecified: Secondary | ICD-10-CM | POA: Diagnosis not present

## 2015-05-05 DIAGNOSIS — N3281 Overactive bladder: Secondary | ICD-10-CM | POA: Insufficient documentation

## 2015-05-05 LAB — LDL CHOLESTEROL, DIRECT: LDL DIRECT: 90 mg/dL

## 2015-05-05 LAB — HEMOGLOBIN A1C: HEMOGLOBIN A1C: 7.5 % — AB (ref 4.6–6.5)

## 2015-05-05 LAB — LIPID PANEL
CHOL/HDL RATIO: 4
CHOLESTEROL: 178 mg/dL (ref 0–200)
HDL: 47 mg/dL (ref >39.00)
NONHDL: 131.02
TRIGLYCERIDES: 252 mg/dL — AB (ref 0.0–149.0)
VLDL: 50.4 mg/dL — ABNORMAL HIGH (ref 0.0–40.0)

## 2015-05-05 LAB — BASIC METABOLIC PANEL
BUN: 18 mg/dL (ref 6–23)
CALCIUM: 9.9 mg/dL (ref 8.4–10.5)
CHLORIDE: 99 meq/L (ref 96–112)
CO2: 28 meq/L (ref 19–32)
CREATININE: 1.14 mg/dL (ref 0.40–1.20)
GFR: 48.55 mL/min — ABNORMAL LOW (ref >60.00)
Glucose, Bld: 130 mg/dL — ABNORMAL HIGH (ref 70–99)
Potassium: 4.1 mEq/L (ref 3.5–5.1)
Sodium: 139 mEq/L (ref 135–145)

## 2015-05-05 MED ORDER — OXYBUTYNIN CHLORIDE ER 5 MG PO TB24: 5.0000 mg | ORAL_TABLET | Freq: Every day | ORAL | Status: DC

## 2015-05-05 MED ORDER — TRAMADOL HCL 50 MG PO TABS: 50.0000 mg | ORAL_TABLET | Freq: Four times a day (QID) | ORAL | Status: DC | PRN

## 2015-05-05 NOTE — Patient Instructions (Signed)
Preventive Care for Adults, Female A healthy lifestyle and preventive care can promote health and wellness. Preventive health guidelines for women include the following key practices.  A routine yearly physical is a good way to check with your health care provider about your health and preventive screening. It is a chance to share any concerns and updates on your health and to receive a thorough exam.  Visit your dentist for a routine exam and preventive care every 6 months. Brush your teeth twice a day and floss once a day. Good oral hygiene prevents tooth decay and gum disease.  The frequency of eye exams is based on your age, health, family medical history, use of contact lenses, and other factors. Follow your health care provider's recommendations for frequency of eye exams.  Eat a healthy diet. Foods like vegetables, fruits, whole grains, low-fat dairy products, and lean protein foods contain the nutrients you need without too many calories. Decrease your intake of foods high in solid fats, added sugars, and salt. Eat the right amount of calories for you.Get information about a proper diet from your health care provider, if necessary.  Regular physical exercise is one of the most important things you can do for your health. Most adults should get at least 150 minutes of moderate-intensity exercise (any activity that increases your heart rate and causes you to sweat) each week. In addition, most adults need muscle-strengthening exercises on 2 or more days a week.  Maintain a healthy weight. The body mass index (BMI) is a screening tool to identify possible weight problems. It provides an estimate of body fat based on height and weight. Your health care provider can find your BMI and can help you achieve or maintain a healthy weight.For adults 20 years and older:  A BMI below 18.5 is considered underweight.  A BMI of 18.5 to 24.9 is normal.  A BMI of 25 to 29.9 is considered overweight.  A  BMI of 30 and above is considered obese.  Maintain normal blood lipids and cholesterol levels by exercising and minimizing your intake of saturated fat. Eat a balanced diet with plenty of fruit and vegetables. Blood tests for lipids and cholesterol should begin at age 45 and be repeated every 5 years. If your lipid or cholesterol levels are high, you are over 50, or you are at high risk for heart disease, you may need your cholesterol levels checked more frequently.Ongoing high lipid and cholesterol levels should be treated with medicines if diet and exercise are not working.  If you smoke, find out from your health care provider how to quit. If you do not use tobacco, do not start.  Lung cancer screening is recommended for adults aged 45-80 years who are at high risk for developing lung cancer because of a history of smoking. A yearly low-dose CT scan of the lungs is recommended for people who have at least a 30-pack-year history of smoking and are a current smoker or have quit within the past 15 years. A pack year of smoking is smoking an average of 1 pack of cigarettes a day for 1 year (for example: 1 pack a day for 30 years or 2 packs a day for 15 years). Yearly screening should continue until the smoker has stopped smoking for at least 15 years. Yearly screening should be stopped for people who develop a health problem that would prevent them from having lung cancer treatment.  If you are pregnant, do not drink alcohol. If you are  breastfeeding, be very cautious about drinking alcohol. If you are not pregnant and choose to drink alcohol, do not have more than 1 drink per day. One drink is considered to be 12 ounces (355 mL) of beer, 5 ounces (148 mL) of wine, or 1.5 ounces (44 mL) of liquor.  Avoid use of street drugs. Do not share needles with anyone. Ask for help if you need support or instructions about stopping the use of drugs.  High blood pressure causes heart disease and increases the risk  of stroke. Your blood pressure should be checked at least every 1 to 2 years. Ongoing high blood pressure should be treated with medicines if weight loss and exercise do not work.  If you are 55-79 years old, ask your health care provider if you should take aspirin to prevent strokes.  Diabetes screening is done by taking a blood sample to check your blood glucose level after you have not eaten for a certain period of time (fasting). If you are not overweight and you do not have risk factors for diabetes, you should be screened once every 3 years starting at age 45. If you are overweight or obese and you are 40-70 years of age, you should be screened for diabetes every year as part of your cardiovascular risk assessment.  Breast cancer screening is essential preventive care for women. You should practice "breast self-awareness." This means understanding the normal appearance and feel of your breasts and may include breast self-examination. Any changes detected, no matter how small, should be reported to a health care provider. Women in their 20s and 30s should have a clinical breast exam (CBE) by a health care provider as part of a regular health exam every 1 to 3 years. After age 40, women should have a CBE every year. Starting at age 40, women should consider having a mammogram (breast X-ray test) every year. Women who have a family history of breast cancer should talk to their health care provider about genetic screening. Women at a high risk of breast cancer should talk to their health care providers about having an MRI and a mammogram every year.  Breast cancer gene (BRCA)-related cancer risk assessment is recommended for women who have family members with BRCA-related cancers. BRCA-related cancers include breast, ovarian, tubal, and peritoneal cancers. Having family members with these cancers may be associated with an increased risk for harmful changes (mutations) in the breast cancer genes BRCA1 and  BRCA2. Results of the assessment will determine the need for genetic counseling and BRCA1 and BRCA2 testing.  Your health care provider may recommend that you be screened regularly for cancer of the pelvic organs (ovaries, uterus, and vagina). This screening involves a pelvic examination, including checking for microscopic changes to the surface of your cervix (Pap test). You may be encouraged to have this screening done every 3 years, beginning at age 21.  For women ages 30-65, health care providers may recommend pelvic exams and Pap testing every 3 years, or they may recommend the Pap and pelvic exam, combined with testing for human papilloma virus (HPV), every 5 years. Some types of HPV increase your risk of cervical cancer. Testing for HPV may also be done on women of any age with unclear Pap test results.  Other health care providers may not recommend any screening for nonpregnant women who are considered low risk for pelvic cancer and who do not have symptoms. Ask your health care provider if a screening pelvic exam is right for   you.  If you have had past treatment for cervical cancer or a condition that could lead to cancer, you need Pap tests and screening for cancer for at least 20 years after your treatment. If Pap tests have been discontinued, your risk factors (such as having a new sexual partner) need to be reassessed to determine if screening should resume. Some women have medical problems that increase the chance of getting cervical cancer. In these cases, your health care provider may recommend more frequent screening and Pap tests.  Colorectal cancer can be detected and often prevented. Most routine colorectal cancer screening begins at the age of 50 years and continues through age 75 years. However, your health care provider may recommend screening at an earlier age if you have risk factors for colon cancer. On a yearly basis, your health care provider may provide home test kits to check  for hidden blood in the stool. Use of a small camera at the end of a tube, to directly examine the colon (sigmoidoscopy or colonoscopy), can detect the earliest forms of colorectal cancer. Talk to your health care provider about this at age 50, when routine screening begins. Direct exam of the colon should be repeated every 5-10 years through age 75 years, unless early forms of precancerous polyps or small growths are found.  People who are at an increased risk for hepatitis B should be screened for this virus. You are considered at high risk for hepatitis B if:  You were born in a country where hepatitis B occurs often. Talk with your health care provider about which countries are considered high risk.  Your parents were born in a high-risk country and you have not received a shot to protect against hepatitis B (hepatitis B vaccine).  You have HIV or AIDS.  You use needles to inject street drugs.  You live with, or have sex with, someone who has hepatitis B.  You get hemodialysis treatment.  You take certain medicines for conditions like cancer, organ transplantation, and autoimmune conditions.  Hepatitis C blood testing is recommended for all people born from 1945 through 1965 and any individual with known risks for hepatitis C.  Practice safe sex. Use condoms and avoid high-risk sexual practices to reduce the spread of sexually transmitted infections (STIs). STIs include gonorrhea, chlamydia, syphilis, trichomonas, herpes, HPV, and human immunodeficiency virus (HIV). Herpes, HIV, and HPV are viral illnesses that have no cure. They can result in disability, cancer, and death.  You should be screened for sexually transmitted illnesses (STIs) including gonorrhea and chlamydia if:  You are sexually active and are younger than 24 years.  You are older than 24 years and your health care provider tells you that you are at risk for this type of infection.  Your sexual activity has changed  since you were last screened and you are at an increased risk for chlamydia or gonorrhea. Ask your health care provider if you are at risk.  If you are at risk of being infected with HIV, it is recommended that you take a prescription medicine daily to prevent HIV infection. This is called preexposure prophylaxis (PrEP). You are considered at risk if:  You are sexually active and do not regularly use condoms or know the HIV status of your partner(s).  You take drugs by injection.  You are sexually active with a partner who has HIV.  Talk with your health care provider about whether you are at high risk of being infected with HIV. If   you choose to begin PrEP, you should first be tested for HIV. You should then be tested every 3 months for as long as you are taking PrEP.  Osteoporosis is a disease in which the bones lose minerals and strength with aging. This can result in serious bone fractures or breaks. The risk of osteoporosis can be identified using a bone density scan. Women ages 67 years and over and women at risk for fractures or osteoporosis should discuss screening with their health care providers. Ask your health care provider whether you should take a calcium supplement or vitamin D to reduce the rate of osteoporosis.  Menopause can be associated with physical symptoms and risks. Hormone replacement therapy is available to decrease symptoms and risks. You should talk to your health care provider about whether hormone replacement therapy is right for you.  Use sunscreen. Apply sunscreen liberally and repeatedly throughout the day. You should seek shade when your shadow is shorter than you. Protect yourself by wearing long sleeves, pants, a wide-brimmed hat, and sunglasses year round, whenever you are outdoors.  Once a month, do a whole body skin exam, using a mirror to look at the skin on your back. Tell your health care provider of new moles, moles that have irregular borders, moles that  are larger than a pencil eraser, or moles that have changed in shape or color.  Stay current with required vaccines (immunizations).  Influenza vaccine. All adults should be immunized every year.  Tetanus, diphtheria, and acellular pertussis (Td, Tdap) vaccine. Pregnant women should receive 1 dose of Tdap vaccine during each pregnancy. The dose should be obtained regardless of the length of time since the last dose. Immunization is preferred during the 27th-36th week of gestation. An adult who has not previously received Tdap or who does not know her vaccine status should receive 1 dose of Tdap. This initial dose should be followed by tetanus and diphtheria toxoids (Td) booster doses every 10 years. Adults with an unknown or incomplete history of completing a 3-dose immunization series with Td-containing vaccines should begin or complete a primary immunization series including a Tdap dose. Adults should receive a Td booster every 10 years.  Varicella vaccine. An adult without evidence of immunity to varicella should receive 2 doses or a second dose if she has previously received 1 dose. Pregnant females who do not have evidence of immunity should receive the first dose after pregnancy. This first dose should be obtained before leaving the health care facility. The second dose should be obtained 4-8 weeks after the first dose.  Human papillomavirus (HPV) vaccine. Females aged 13-26 years who have not received the vaccine previously should obtain the 3-dose series. The vaccine is not recommended for use in pregnant females. However, pregnancy testing is not needed before receiving a dose. If a female is found to be pregnant after receiving a dose, no treatment is needed. In that case, the remaining doses should be delayed until after the pregnancy. Immunization is recommended for any person with an immunocompromised condition through the age of 61 years if she did not get any or all doses earlier. During the  3-dose series, the second dose should be obtained 4-8 weeks after the first dose. The third dose should be obtained 24 weeks after the first dose and 16 weeks after the second dose.  Zoster vaccine. One dose is recommended for adults aged 30 years or older unless certain conditions are present.  Measles, mumps, and rubella (MMR) vaccine. Adults born  before 1957 generally are considered immune to measles and mumps. Adults born in 1957 or later should have 1 or more doses of MMR vaccine unless there is a contraindication to the vaccine or there is laboratory evidence of immunity to each of the three diseases. A routine second dose of MMR vaccine should be obtained at least 28 days after the first dose for students attending postsecondary schools, health care workers, or international travelers. People who received inactivated measles vaccine or an unknown type of measles vaccine during 1963-1967 should receive 2 doses of MMR vaccine. People who received inactivated mumps vaccine or an unknown type of mumps vaccine before 1979 and are at high risk for mumps infection should consider immunization with 2 doses of MMR vaccine. For females of childbearing age, rubella immunity should be determined. If there is no evidence of immunity, females who are not pregnant should be vaccinated. If there is no evidence of immunity, females who are pregnant should delay immunization until after pregnancy. Unvaccinated health care workers born before 1957 who lack laboratory evidence of measles, mumps, or rubella immunity or laboratory confirmation of disease should consider measles and mumps immunization with 2 doses of MMR vaccine or rubella immunization with 1 dose of MMR vaccine.  Pneumococcal 13-valent conjugate (PCV13) vaccine. When indicated, a person who is uncertain of his immunization history and has no record of immunization should receive the PCV13 vaccine. All adults 65 years of age and older should receive this  vaccine. An adult aged 19 years or older who has certain medical conditions and has not been previously immunized should receive 1 dose of PCV13 vaccine. This PCV13 should be followed with a dose of pneumococcal polysaccharide (PPSV23) vaccine. Adults who are at high risk for pneumococcal disease should obtain the PPSV23 vaccine at least 8 weeks after the dose of PCV13 vaccine. Adults older than 79 years of age who have normal immune system function should obtain the PPSV23 vaccine dose at least 1 year after the dose of PCV13 vaccine.  Pneumococcal polysaccharide (PPSV23) vaccine. When PCV13 is also indicated, PCV13 should be obtained first. All adults aged 65 years and older should be immunized. An adult younger than age 65 years who has certain medical conditions should be immunized. Any person who resides in a nursing home or long-term care facility should be immunized. An adult smoker should be immunized. People with an immunocompromised condition and certain other conditions should receive both PCV13 and PPSV23 vaccines. People with human immunodeficiency virus (HIV) infection should be immunized as soon as possible after diagnosis. Immunization during chemotherapy or radiation therapy should be avoided. Routine use of PPSV23 vaccine is not recommended for American Indians, [place] Natives, or people younger than 65 years unless there are medical conditions that require PPSV23 vaccine. When indicated, people who have unknown immunization and have no record of immunization should receive PPSV23 vaccine. One-time revaccination 5 years after the first dose of PPSV23 is recommended for people aged 19-64 years who have chronic kidney failure, nephrotic syndrome, asplenia, or immunocompromised conditions. People who received 1-2 doses of PPSV23 before age 65 years should receive another dose of PPSV23 vaccine at age 65 years or later if at least 5 years have passed since the previous dose. Doses of PPSV23 are not  needed for people immunized with PPSV23 at or after age 65 years.  Meningococcal vaccine. Adults with asplenia or persistent complement component deficiencies should receive 2 doses of quadrivalent meningococcal conjugate (MenACWY-D) vaccine. The doses should be obtained   at least 2 months apart. Microbiologists working with certain meningococcal bacteria, [place] recruits, people at risk during an outbreak, and people who travel to or live in countries with a high rate of meningitis should be immunized. A first-year college student up through age 34 years who is living in a residence hall should receive a dose if she did not receive a dose on or after her 16th birthday. Adults who have certain high-risk conditions should receive one or more doses of vaccine.  Hepatitis A vaccine. Adults who wish to be protected from this disease, have certain high-risk conditions, work with hepatitis A-infected animals, work in hepatitis A research labs, or travel to or work in countries with a high rate of hepatitis A should be immunized. Adults who were previously unvaccinated and who anticipate close contact with an international adoptee during the first 60 days after arrival in the [place] from a country with a high rate of hepatitis A should be immunized.  Hepatitis B vaccine. Adults who wish to be protected from this disease, have certain high-risk conditions, may be exposed to blood or other infectious body fluids, are household contacts or sex partners of hepatitis B positive people, are clients or workers in certain care facilities, or travel to or work in countries with a high rate of hepatitis B should be immunized.  Haemophilus influenzae type b (Hib) vaccine. A previously unvaccinated person with asplenia or sickle cell disease or having a scheduled splenectomy should receive 1 dose of Hib vaccine. Regardless of previous immunization, a recipient of a hematopoietic stem cell transplant should receive a  3-dose series 6-12 months after her successful transplant. Hib vaccine is not recommended for adults with HIV infection. Preventive Services / Frequency Ages 35 to 4 years  Blood pressure check.** / Every 3-5 years.  Lipid and cholesterol check.** / Every 5 years beginning at age 60.  Clinical breast exam.** / Every 3 years for women in their 71s and 10s.  BRCA-related cancer risk assessment.** / For women who have family members with a BRCA-related cancer (breast, ovarian, tubal, or peritoneal cancers).  Pap test.** / Every 2 years from ages 76 through 26. Every 3 years starting at age 61 through age 76 or 93 with a history of 3 consecutive normal Pap tests.  HPV screening.** / Every 3 years from ages 37 through ages 60 to 51 with a history of 3 consecutive normal Pap tests.  Hepatitis C blood test.** / For any individual with known risks for hepatitis C.  Skin self-exam. / Monthly.  Influenza vaccine. / Every year.  Tetanus, diphtheria, and acellular pertussis (Tdap, Td) vaccine.** / Consult your health care provider. Pregnant women should receive 1 dose of Tdap vaccine during each pregnancy. 1 dose of Td every 10 years.  Varicella vaccine.** / Consult your health care provider. Pregnant females who do not have evidence of immunity should receive the first dose after pregnancy.  HPV vaccine. / 3 doses over 6 months, if 93 and younger. The vaccine is not recommended for use in pregnant females. However, pregnancy testing is not needed before receiving a dose.  Measles, mumps, rubella (MMR) vaccine.** / You need at least 1 dose of MMR if you were born in 1957 or later. You may also need a 2nd dose. For females of childbearing age, rubella immunity should be determined. If there is no evidence of immunity, females who are not pregnant should be vaccinated. If there is no evidence of immunity, females who are  pregnant should delay immunization until after pregnancy.  Pneumococcal  13-valent conjugate (PCV13) vaccine.** / Consult your health care provider.  Pneumococcal polysaccharide (PPSV23) vaccine.** / 1 to 2 doses if you smoke cigarettes or if you have certain conditions.  Meningococcal vaccine.** / 1 dose if you are age 68 to 8 years and a Market researcher living in a residence hall, or have one of several medical conditions, you need to get vaccinated against meningococcal disease. You may also need additional booster doses.  Hepatitis A vaccine.** / Consult your health care provider.  Hepatitis B vaccine.** / Consult your health care provider.  Haemophilus influenzae type b (Hib) vaccine.** / Consult your health care provider. Ages 7 to 53 years  Blood pressure check.** / Every year.  Lipid and cholesterol check.** / Every 5 years beginning at age 25 years.  Lung cancer screening. / Every year if you are aged 11-80 years and have a 30-pack-year history of smoking and currently smoke or have quit within the past 15 years. Yearly screening is stopped once you have quit smoking for at least 15 years or develop a health problem that would prevent you from having lung cancer treatment.  Clinical breast exam.** / Every year after age 48 years.  BRCA-related cancer risk assessment.** / For women who have family members with a BRCA-related cancer (breast, ovarian, tubal, or peritoneal cancers).  Mammogram.** / Every year beginning at age 41 years and continuing for as long as you are in good health. Consult with your health care provider.  Pap test.** / Every 3 years starting at age 65 years through age 37 or 70 years with a history of 3 consecutive normal Pap tests.  HPV screening.** / Every 3 years from ages 72 years through ages 60 to 40 years with a history of 3 consecutive normal Pap tests.  Fecal occult blood test (FOBT) of stool. / Every year beginning at age 21 years and continuing until age 5 years. You may not need to do this test if you get  a colonoscopy every 10 years.  Flexible sigmoidoscopy or colonoscopy.** / Every 5 years for a flexible sigmoidoscopy or every 10 years for a colonoscopy beginning at age 35 years and continuing until age 48 years.  Hepatitis C blood test.** / For all people born from [birth year] through 1965 and any individual with known risks for hepatitis C.  Skin self-exam. / Monthly.  Influenza vaccine. / Every year.  Tetanus, diphtheria, and acellular pertussis (Tdap/Td) vaccine.** / Consult your health care provider. Pregnant women should receive 1 dose of Tdap vaccine during each pregnancy. 1 dose of Td every 10 years.  Varicella vaccine.** / Consult your health care provider. Pregnant females who do not have evidence of immunity should receive the first dose after pregnancy.  Zoster vaccine.** / 1 dose for adults aged 30 years or older.  Measles, mumps, rubella (MMR) vaccine.** / You need at least 1 dose of MMR if you were born in 1957 or later. You may also need a second dose. For females of childbearing age, rubella immunity should be determined. If there is no evidence of immunity, females who are not pregnant should be vaccinated. If there is no evidence of immunity, females who are pregnant should delay immunization until after pregnancy.  Pneumococcal 13-valent conjugate (PCV13) vaccine.** / Consult your health care provider.  Pneumococcal polysaccharide (PPSV23) vaccine.** / 1 to 2 doses if you smoke cigarettes or if you have certain conditions.  Meningococcal vaccine.** /  Consult your health care provider.  Hepatitis A vaccine.** / Consult your health care provider.  Hepatitis B vaccine.** / Consult your health care provider.  Haemophilus influenzae type b (Hib) vaccine.** / Consult your health care provider. Ages 64 years and over  Blood pressure check.** / Every year.  Lipid and cholesterol check.** / Every 5 years beginning at age 23 years.  Lung cancer screening. / Every year if you  are aged 16-80 years and have a 30-pack-year history of smoking and currently smoke or have quit within the past 15 years. Yearly screening is stopped once you have quit smoking for at least 15 years or develop a health problem that would prevent you from having lung cancer treatment.  Clinical breast exam.** / Every year after age 74 years.  BRCA-related cancer risk assessment.** / For women who have family members with a BRCA-related cancer (breast, ovarian, tubal, or peritoneal cancers).  Mammogram.** / Every year beginning at age 44 years and continuing for as long as you are in good health. Consult with your health care provider.  Pap test.** / Every 3 years starting at age 58 years through age 22 or 39 years with 3 consecutive normal Pap tests. Testing can be stopped between 65 and 70 years with 3 consecutive normal Pap tests and no abnormal Pap or HPV tests in the past 10 years.  HPV screening.** / Every 3 years from ages 64 years through ages 70 or 61 years with a history of 3 consecutive normal Pap tests. Testing can be stopped between 65 and 70 years with 3 consecutive normal Pap tests and no abnormal Pap or HPV tests in the past 10 years.  Fecal occult blood test (FOBT) of stool. / Every year beginning at age 40 years and continuing until age 27 years. You may not need to do this test if you get a colonoscopy every 10 years.  Flexible sigmoidoscopy or colonoscopy.** / Every 5 years for a flexible sigmoidoscopy or every 10 years for a colonoscopy beginning at age 7 years and continuing until age 32 years.  Hepatitis C blood test.** / For all people born from [birth year] through 1963 and any individual with known risks for hepatitis C.  Osteoporosis screening.** / A one-time screening for women ages 30 years and over and women at risk for fractures or osteoporosis.  Skin self-exam. / Monthly.  Influenza vaccine. / Every year.  Tetanus, diphtheria, and acellular pertussis (Tdap/Td)  vaccine.** / 1 dose of Td every 10 years.  Varicella vaccine.** / Consult your health care provider.  Zoster vaccine.** / 1 dose for adults aged 35 years or older.  Pneumococcal 13-valent conjugate (PCV13) vaccine.** / Consult your health care provider.  Pneumococcal polysaccharide (PPSV23) vaccine.** / 1 dose for all adults aged 46 years and older.  Meningococcal vaccine.** / Consult your health care provider.  Hepatitis A vaccine.** / Consult your health care provider.  Hepatitis B vaccine.** / Consult your health care provider.  Haemophilus influenzae type b (Hib) vaccine.** / Consult your health care provider. ** Family history and personal history of risk and conditions may change your health care provider's recommendations.   This information is not intended to replace advice given to you by your health care provider. Make sure you discuss any questions you have with your health care provider.   Document Released: 08/01/2001 Document Revised: 06/26/2014 Document Reviewed: 10/31/2010 Elsevier Interactive Patient Education [employer].

## 2015-05-05 NOTE — Progress Notes (Signed)
Subjective:  Patient ID: Andrea Wilkinson, female    DOB: 1932/05/26  Age: 81 y.o. MRN: [id number]  CC: Hyperlipidemia; Hypertension; Diabetes; and Annual Exam   HPI Andrea Wilkinson presents for a CPX and f/up on DM2. HTN, high cholesterol. She offers no new complaints today.  Outpatient Prescriptions Prior to Visit  Medication Sig Dispense Refill  . ACCU-CHEK AVIVA PLUS test strip USE TO CHECK BLOOD SUGAR TWICE DAILY 200 each 3  . Blood Glucose Monitoring Suppl (ACCU-CHEK AVIVA PLUS) W/DEVICE KIT Use to check blood sugars daily 1 kit 0  . Calcium Carbonate-Vitamin D 600-400 MG-UNIT per tablet Take 1 tablet by mouth daily.    . enalapril (VASOTEC) 20 MG tablet Take 1 tablet (20 mg total) by mouth daily. 90 tablet 3  . furosemide (LASIX) 20 MG tablet Take 1 tablet (20 mg total) by mouth daily. 90 tablet 3  . glucose blood (ACCU-CHEK AVIVA PLUS) test strip Use to check blood sugar twice a day Dx 250.00 180 each 3  . glucose blood (ACCU-CHEK COMPACT TEST DRUM) test strip Use as instructed 100 each 12  . Lancets (ACCU-CHEK MULTICLIX) lancets Use to help check blood sugars twice a day Dx 250.00 180 each 3  . meloxicam (MOBIC) 15 MG tablet Take 1 tablet (15 mg total) by mouth daily. 90 tablet 1  . metFORMIN (GLUCOPHAGE) 1000 MG tablet Take 1 tablet (1,000 mg total) by mouth 2 (two) times daily with a meal. 180 tablet 3  . omeprazole (PRILOSEC) 20 MG capsule Take 1 capsule (20 mg total) by mouth daily. 90 capsule 3  . tiZANidine (ZANAFLEX) 2 MG tablet TAKE 1 TABLET EVERY 6 HOURS AS NEEDED FOR MUSCLE SPASMS 120 tablet 3  . clotrimazole (MYCELEX) 10 MG troche Take 1 lozenge (10 mg total) by mouth 3 (three) times daily. 25 lozenge 3  . desonide (DESOWEN) 0.05 % lotion Apply topically 2 (two) times daily. 59 mL 2  . Eluxadoline (VIBERZI) 75 MG TABS Take 1 tablet by mouth 2 (two) times daily. 180 tablet 1  . glipiZIDE (GLIPIZIDE XL) 2.5 MG 24 hr tablet Take 1 tablet (2.5 mg total) by mouth daily. 90  tablet 3  . hydrOXYzine (ATARAX/VISTARIL) 50 MG tablet Take 1 tablet (50 mg total) by mouth 3 (three) times daily as needed. 30 tablet 3  . oxybutynin (DITROPAN-XL) 5 MG 24 hr tablet Take 1 tablet (5 mg total) by mouth daily. 90 tablet 3  . traMADol (ULTRAM) 50 MG tablet Take 1 tablet (50 mg total) by mouth every 6 (six) hours as needed. 360 tablet 1  . triamcinolone cream (KENALOG) 0.5 % Apply 1 application topically 3 (three) times daily. 100 g 3  . belladonna-PHENObarbital (DONNATAL) 16.2 MG/5ML ELIX Take 5 mLs (16.2 mg total) by mouth 3 (three) times daily as needed for cramping. (Patient not taking: Reported on 05/05/2015) 600 mL 2   No facility-administered medications prior to visit.    ROS Review of Systems  Constitutional: Negative.  Negative for fever, chills, diaphoresis, appetite change and fatigue.  HENT: Negative.   Eyes: Negative.   Respiratory: Negative.  Negative for cough, choking, chest tightness, shortness of breath and stridor.   Cardiovascular: Negative.  Negative for chest pain, palpitations and leg swelling.  Gastrointestinal: Negative.  Negative for vomiting, abdominal pain, diarrhea, constipation and blood in stool.  Endocrine: Negative.  Negative for polydipsia, polyphagia and polyuria.  Genitourinary: Negative.  Negative for difficulty urinating.  Musculoskeletal: Positive for back pain and arthralgias.  Negative for myalgias and joint swelling.  Skin: Negative.  Negative for rash.  Allergic/Immunologic: Negative.   Neurological: Negative.  Negative for dizziness, tremors, syncope, light-headedness, numbness and headaches.  Hematological: Negative.  Negative for adenopathy. Does not bruise/bleed easily.  Psychiatric/Behavioral: Negative.     Objective:  BP 148/86 mmHg  Pulse 83  Temp(Src) 97.9 F (36.6 C) (Oral)  Resp 16  Ht 5' (1.524 m)  Wt 154 lb (69.854 kg)  BMI 30.08 kg/m2  SpO2 98%  BP Readings from Last 3 Encounters:  05/05/15 148/86  12/16/14  126/74  08/17/14 128/80    Wt Readings from Last 3 Encounters:  05/05/15 154 lb (69.854 kg)  12/16/14 162 lb (73.483 kg)  08/17/14 167 lb (75.751 kg)    Physical Exam  Constitutional: She is oriented to person, place, and time. No distress.  HENT:  Head: Normocephalic and atraumatic.  Mouth/Throat: Oropharynx is clear and moist. No oropharyngeal exudate.  Eyes: Conjunctivae are normal. Right eye exhibits no discharge. Left eye exhibits no discharge. No scleral icterus.  Neck: Normal range of motion. Neck supple. No JVD present. No tracheal deviation present. No thyromegaly present.  Cardiovascular: Normal rate, regular rhythm, normal heart sounds and intact distal pulses.  Exam reveals no gallop and no friction rub.   No murmur heard. Pulmonary/Chest: Effort normal and breath sounds normal. No stridor. No respiratory distress. She has no wheezes. She has no rales. She exhibits no tenderness.  Abdominal: Soft. Bowel sounds are normal. She exhibits no distension and no mass. There is no tenderness. There is no rebound and no guarding.  Musculoskeletal: Normal range of motion. She exhibits no edema or tenderness.  Lymphadenopathy:    She has no cervical adenopathy.  Neurological: She is oriented to person, place, and time.  Skin: Skin is warm and dry. No rash noted. She is not diaphoretic. No erythema. No pallor.  Psychiatric: She has a normal mood and affect. Her behavior is normal. Judgment and thought content normal.  Vitals reviewed.   Lab Results  Component Value Date   WBC 9.1 04/07/2014   HGB 13.5 04/07/2014   HCT 41.1 04/07/2014   PLT 272.0 04/07/2014   GLUCOSE 130* 05/05/2015   CHOL 178 05/05/2015   TRIG 252.0* 05/05/2015   HDL 47.00 05/05/2015   LDLDIRECT 90.0 05/05/2015   LDLCALC 119* 09/24/2013   ALT 14 04/07/2014   AST 18 04/07/2014   NA 139 05/05/2015   K 4.1 05/05/2015   CL 99 05/05/2015   CREATININE 1.14 05/05/2015   BUN 18 05/05/2015   CO2 28 05/05/2015     TSH 0.95 04/07/2014   INR 1.00 10/31/2010   HGBA1C 7.5* 05/05/2015   MICROALBUR 0.8 08/17/2014    Dg Abd Acute W/chest  10/14/2013  CLINICAL DATA:  Abdominal pain and pressure EXAM: ACUTE ABDOMEN SERIES (ABDOMEN 2 VIEW & CHEST 1 VIEW) COMPARISON:  Chest radiograph June 17, 2010; CT abdomen March 08, 2005 FINDINGS: PA chest: There is mild scarring in the left base, stable. There is no edema or consolidation. The heart size and pulmonary vascularity are normal. No adenopathy. There old rib fractures as well as an old clavicle fracture on the right, stable. Supine and upright abdomen: There is diffuse stool throughout the colon. Overall, the bowel gas pattern is unremarkable. No obstruction or free air. There are multiple surgical clips in the right upper abdomen. There is arthropathy in both hip joints. There is degenerative change in the lumbar spine with levoscoliosis. There is  atherosclerotic calcification in the aorta. IMPRESSION: Bowel gas pattern unremarkable. Fairly diffuse stool in colon. Atherosclerotic change noted. No edema or consolidation. Electronically Signed   By: Lowella Grip M.D.   On: 10/14/2013 13:54    Assessment & Plan:   Rogelio was seen today for hyperlipidemia, hypertension, diabetes and annual exam.  Diagnoses and all orders for this visit:  Type 2 diabetes mellitus with complication, without long-term current use of insulin ([place])- her blood sugars are well controlled, renal function is stable -     Lipid panel; Future -     Basic metabolic panel; Future -     Hemoglobin A1c; Future  Essential hypertension- her BP is well controlled, lytes and renal function are stable -     Basic metabolic panel; Future -     Hemoglobin A1c; Future  Hyperlipidemia with target LDL less than 100- she has achieved her LDL goal and os doing well on the statin -     Lipid panel; Future  Primary osteoarthritis involving multiple joints -     traMADol (ULTRAM) 50 MG tablet;  Take 1 tablet (50 mg total) by mouth every 6 (six) hours as needed.  Spondylosis of lumbar region without myelopathy or radiculopathy -     traMADol (ULTRAM) 50 MG tablet; Take 1 tablet (50 mg total) by mouth every 6 (six) hours as needed.  Encounter for immunization  OAB (overactive bladder) -     oxybutynin (DITROPAN-XL) 5 MG 24 hr tablet; Take 1 tablet (5 mg total) by mouth daily.  Routine general medical examination at a health care facility  Other orders -     Flu vaccine HIGH DOSE PF   I have discontinued Ms. Crombie's clotrimazole, desonide, hydrOXYzine, Eluxadoline, belladonna-PHENObarbital, glipiZIDE, and triamcinolone cream. I am also having her maintain her Calcium Carbonate-Vitamin D, glucose blood, furosemide, ACCU-CHEK AVIVA PLUS, glucose blood, accu-chek multiclix, enalapril, metFORMIN, omeprazole, ACCU-CHEK AVIVA PLUS, tiZANidine, meloxicam, oxybutynin, and traMADol.  Meds ordered this encounter  Medications  . oxybutynin (DITROPAN-XL) 5 MG 24 hr tablet    Sig: Take 1 tablet (5 mg total) by mouth daily.    Dispense:  90 tablet    Refill:  3  . traMADol (ULTRAM) 50 MG tablet    Sig: Take 1 tablet (50 mg total) by mouth every 6 (six) hours as needed.    Dispense:  360 tablet    Refill:  1   See AVS for instructions about healthy living and anticipatory guidance.  Follow-up: Return in about 6 months (around 11/02/2015).  Scarlette Calico, MD

## 2015-05-05 NOTE — Progress Notes (Signed)
Pre visit review using our clinic review tool, if applicable. No additional management support is needed unless otherwise documented below in the visit note. 

## 2015-05-05 NOTE — Telephone Encounter (Signed)
Calling to inform pt she left her jacket. Will be back up to get in. Located in cabinet at front

## 2015-05-14 ENCOUNTER — Telehealth: Payer: Self-pay | Admitting: Internal Medicine

## 2015-05-14 NOTE — Telephone Encounter (Signed)
Patient is requesting tramadol to be sent to [facility] mail order.  Please follow follow up with patient in regards.  States she was given a written script but she needs sent to [facility].

## 2015-05-17 NOTE — Telephone Encounter (Signed)
This has been called in. Will inform pt . Spoke will pharmACY.According to filling schedule she is not due for another refill until December 28th they will hold rf request and send out next shipment on 12/19.

## 2015-05-19 ENCOUNTER — Encounter: Payer: Self-pay | Admitting: Internal Medicine

## 2015-05-20 ENCOUNTER — Ambulatory Visit (INDEPENDENT_AMBULATORY_CARE_PROVIDER_SITE_OTHER): Payer: Commercial Managed Care - HMO | Admitting: Ophthalmology

## 2015-05-20 DIAGNOSIS — E11311 Type 2 diabetes mellitus with unspecified diabetic retinopathy with macular edema: Secondary | ICD-10-CM | POA: Diagnosis not present

## 2015-05-20 DIAGNOSIS — H35372 Puckering of macula, left eye: Secondary | ICD-10-CM

## 2015-05-20 DIAGNOSIS — H35033 Hypertensive retinopathy, bilateral: Secondary | ICD-10-CM

## 2015-05-20 DIAGNOSIS — E113392 Type 2 diabetes mellitus with moderate nonproliferative diabetic retinopathy without macular edema, left eye: Secondary | ICD-10-CM

## 2015-05-20 DIAGNOSIS — I10 Essential (primary) hypertension: Secondary | ICD-10-CM

## 2015-05-20 DIAGNOSIS — H43813 Vitreous degeneration, bilateral: Secondary | ICD-10-CM | POA: Diagnosis not present

## 2015-05-20 DIAGNOSIS — H353132 Nonexudative age-related macular degeneration, bilateral, intermediate dry stage: Secondary | ICD-10-CM

## 2015-05-20 DIAGNOSIS — E113291 Type 2 diabetes mellitus with mild nonproliferative diabetic retinopathy without macular edema, right eye: Secondary | ICD-10-CM | POA: Diagnosis not present

## 2015-05-28 ENCOUNTER — Encounter: Payer: Self-pay | Admitting: Gastroenterology

## 2015-06-11 ENCOUNTER — Emergency Department (HOSPITAL_COMMUNITY): Payer: Commercial Managed Care - HMO

## 2015-06-11 ENCOUNTER — Encounter (HOSPITAL_COMMUNITY): Payer: Self-pay | Admitting: Emergency Medicine

## 2015-06-11 ENCOUNTER — Emergency Department (HOSPITAL_COMMUNITY)
Admission: EM | Admit: 2015-06-11 | Discharge: 2015-06-11 | Disposition: A | Discharge status: Home / Self Care | Payer: Commercial Managed Care - HMO | Attending: Emergency Medicine | Admitting: Emergency Medicine

## 2015-06-11 DIAGNOSIS — S79911A Unspecified injury of right hip, initial encounter: Secondary | ICD-10-CM | POA: Insufficient documentation

## 2015-06-11 DIAGNOSIS — S20211A Contusion of right front wall of thorax, initial encounter: Secondary | ICD-10-CM | POA: Insufficient documentation

## 2015-06-11 DIAGNOSIS — S4991XA Unspecified injury of right shoulder and upper arm, initial encounter: Secondary | ICD-10-CM | POA: Insufficient documentation

## 2015-06-11 DIAGNOSIS — T148XXA Other injury of unspecified body region, initial encounter: Secondary | ICD-10-CM

## 2015-06-11 DIAGNOSIS — K219 Gastro-esophageal reflux disease without esophagitis: Secondary | ICD-10-CM | POA: Insufficient documentation

## 2015-06-11 DIAGNOSIS — I10 Essential (primary) hypertension: Secondary | ICD-10-CM | POA: Diagnosis not present

## 2015-06-11 DIAGNOSIS — W1839XA Other fall on same level, initial encounter: Secondary | ICD-10-CM | POA: Diagnosis not present

## 2015-06-11 DIAGNOSIS — S199XXA Unspecified injury of neck, initial encounter: Secondary | ICD-10-CM | POA: Diagnosis not present

## 2015-06-11 DIAGNOSIS — Y9289 Other specified places as the place of occurrence of the external cause: Secondary | ICD-10-CM | POA: Insufficient documentation

## 2015-06-11 DIAGNOSIS — Y9301 Activity, walking, marching and hiking: Secondary | ICD-10-CM | POA: Insufficient documentation

## 2015-06-11 DIAGNOSIS — R0602 Shortness of breath: Secondary | ICD-10-CM | POA: Diagnosis not present

## 2015-06-11 DIAGNOSIS — R55 Syncope and collapse: Secondary | ICD-10-CM | POA: Diagnosis not present

## 2015-06-11 DIAGNOSIS — M25551 Pain in right hip: Secondary | ICD-10-CM | POA: Diagnosis not present

## 2015-06-11 DIAGNOSIS — Y998 Other external cause status: Secondary | ICD-10-CM | POA: Diagnosis not present

## 2015-06-11 DIAGNOSIS — S76011A Strain of muscle, fascia and tendon of right hip, initial encounter: Secondary | ICD-10-CM | POA: Diagnosis not present

## 2015-06-11 DIAGNOSIS — M25511 Pain in right shoulder: Secondary | ICD-10-CM | POA: Diagnosis not present

## 2015-06-11 DIAGNOSIS — Z79899 Other long term (current) drug therapy: Secondary | ICD-10-CM | POA: Insufficient documentation

## 2015-06-11 DIAGNOSIS — T148 Other injury of unspecified body region: Secondary | ICD-10-CM | POA: Diagnosis not present

## 2015-06-11 DIAGNOSIS — W19XXXA Unspecified fall, initial encounter: Secondary | ICD-10-CM

## 2015-06-11 DIAGNOSIS — S46911A Strain of unspecified muscle, fascia and tendon at shoulder and upper arm level, right arm, initial encounter: Secondary | ICD-10-CM | POA: Diagnosis not present

## 2015-06-11 DIAGNOSIS — S29001A Unspecified injury of muscle and tendon of front wall of thorax, initial encounter: Secondary | ICD-10-CM | POA: Diagnosis present

## 2015-06-11 DIAGNOSIS — Z96651 Presence of right artificial knee joint: Secondary | ICD-10-CM | POA: Diagnosis not present

## 2015-06-11 DIAGNOSIS — Z791 Long term (current) use of non-steroidal anti-inflammatories (NSAID): Secondary | ICD-10-CM | POA: Insufficient documentation

## 2015-06-11 DIAGNOSIS — M4682 Other specified inflammatory spondylopathies, cervical region: Secondary | ICD-10-CM | POA: Diagnosis not present

## 2015-06-11 DIAGNOSIS — Z471 Aftercare following joint replacement surgery: Secondary | ICD-10-CM | POA: Diagnosis not present

## 2015-06-11 DIAGNOSIS — R110 Nausea: Secondary | ICD-10-CM | POA: Insufficient documentation

## 2015-06-11 DIAGNOSIS — E119 Type 2 diabetes mellitus without complications: Secondary | ICD-10-CM | POA: Insufficient documentation

## 2015-06-11 DIAGNOSIS — Z7984 Long term (current) use of oral hypoglycemic drugs: Secondary | ICD-10-CM | POA: Diagnosis not present

## 2015-06-11 DIAGNOSIS — J90 Pleural effusion, not elsewhere classified: Secondary | ICD-10-CM | POA: Diagnosis not present

## 2015-06-11 DIAGNOSIS — M25552 Pain in left hip: Secondary | ICD-10-CM | POA: Diagnosis not present

## 2015-06-11 DIAGNOSIS — S0990XA Unspecified injury of head, initial encounter: Secondary | ICD-10-CM | POA: Diagnosis not present

## 2015-06-11 LAB — CBC
HEMATOCRIT: 39.9 % (ref 36.0–46.0)
Hemoglobin: 12.7 g/dL (ref 12.0–15.0)
MCH: 29.9 pg (ref 26.0–34.0)
MCHC: 31.8 g/dL (ref 30.0–36.0)
MCV: 93.9 fL (ref 78.0–100.0)
Platelets: 241 10*3/uL (ref 150–400)
RBC: 4.25 MIL/uL (ref 3.87–5.11)
RDW: 13.3 % (ref 11.5–15.5)
WBC: 8 10*3/uL (ref 4.0–10.5)

## 2015-06-11 LAB — BASIC METABOLIC PANEL
Anion gap: 12 (ref 5–15)
BUN: 13 mg/dL (ref 6–20)
CALCIUM: 10.1 mg/dL (ref 8.9–10.3)
CO2: 29 mmol/L (ref 22–32)
CREATININE: 1 mg/dL (ref 0.44–1.00)
Chloride: 100 mmol/L — ABNORMAL LOW (ref 101–111)
GFR calc non Af Amer: 51 mL/min — ABNORMAL LOW (ref >60)
GFR, EST AFRICAN AMERICAN: 60 mL/min — AB (ref >60)
Glucose, Bld: 176 mg/dL — ABNORMAL HIGH (ref 65–99)
Potassium: 4.1 mmol/L (ref 3.5–5.1)
SODIUM: 141 mmol/L (ref 135–145)

## 2015-06-11 LAB — TROPONIN I: Troponin I: <0.03 ng/mL (ref <0.031)

## 2015-06-11 LAB — CBG MONITORING, ED: GLUCOSE-CAPILLARY: 163 mg/dL — AB (ref 65–99)

## 2015-06-11 MED ORDER — TRAMADOL HCL 50 MG PO TABS
50.0000 mg | ORAL_TABLET | Freq: Once | ORAL | Status: AC
  Administered 2015-06-11: 50 mg via ORAL
  Filled 2015-06-11: qty 1

## 2015-06-11 MED ORDER — LIDOCAINE 5 % EX PTCH: 1.0000 | MEDICATED_PATCH | CUTANEOUS | Status: DC

## 2015-06-11 NOTE — ED Notes (Signed)
Charge Teacher, adult education at bedside performing (IS). Performed without difficulty. Informed EDPA Dorothea Ogle

## 2015-06-11 NOTE — Progress Notes (Signed)
CSW spoke with patient at bedside. Patient was oriented during the conversation. Patient stated she was at an outside building at her home. Patient stated she does not know if she passed out, however, she missed the ramp at the outside building. Patient stated she has not fallen in several years.   Patient stated she does reside at home with her youngest son, Kylah Puricelli. Patient could not remember his contact number. Patient stated her other son, Antonietta Barcelona Weidler brought her into the ED. Patient stated her sons are her main support and she has two other sons as well.   Patient stated she uses a cane at times, as she has arthritis in both hips and her back. Patient stated she was able to complete her ADL's prior to being admitted to the ED.   Bark Bobe, son, [phone]  Patient had no further questions for CSW at this time.   Genice Rouge [id number] ED CSW 06/11/2015 1:01 PM

## 2015-06-11 NOTE — ED Notes (Signed)
Patient transported to X-ray 

## 2015-06-11 NOTE — Discharge Instructions (Signed)
Please read and follow all provided instructions.  Your diagnoses today include:  1. Rib contusion, right, initial encounter   2. Fall   3. Muscle strain    Tests performed today include:  Vital signs. See below for your results today.   Medications prescribed:   Lidoderm Patches (see prescription directions) Remove every 12 hours.   Home care instructions:  Follow any educational materials contained in this packet. Use Spirometer to keep lungs strong and prevent pneumonia. It's important to practice deep breathing exercises at least 10-15 times an hour.   Follow-up instructions: Please follow-up with your primary care provider in the next 48 hours for further evaluation of symptoms and referral to Cardiologist due to loss of consciousness   Return instructions:   Please return to the Emergency Department if you do not get better, if you get worse, or new symptoms OR  - Fever (temperature greater than 101.36F)  - Bleeding that does not stop with holding pressure to the area    -Severe pain (please note that you may be more sore the day after your accident)  - Chest Pain  - Difficulty breathing  - Severe nausea or vomiting  - Inability to tolerate food and liquids  - Passing out  - Skin becoming red around your wounds  - Change in mental status (confusion or lethargy)  - New numbness or weakness     Please return if you have any other emergent concerns.  Additional Information:  Your vital signs today were: BP 180/84 mmHg   Pulse 79   Temp(Src) 98.1 F (36.7 C) (Oral)   Resp 18   Ht 5' (1.524 m)   Wt 69.4 kg   BMI 29.88 kg/m2   SpO2 97% If your blood pressure (BP) was elevated above 135/85 this visit, please have this repeated by your doctor within one month. ---------------

## 2015-06-11 NOTE — ED Notes (Signed)
Guided pt through incentive spirometry. Pt was easily able to display inspiration between 750 and 1000 ml for each attempt. Pt sts that she has done incentive spirometry before. Primary RN and PA Dorothea Ogle made aware of pt progress

## 2015-06-11 NOTE — ED Notes (Signed)
Pt comes to Ed with c/o of pain from a previous fall on Wednesday, c/o of leg pain, shoulder pain, knee pain, generalized pain all over. Pt reports LOC after the fall but did not seek medical attention at that time.  Pt never hit her head, but did feel nausea after her fall.

## 2015-06-11 NOTE — Progress Notes (Signed)
CSW went to room to speak with patient. Patient was out of the room at the time.  Genice Rouge [id number] ED CSW 06/11/2015 11:35 AM

## 2015-06-11 NOTE — ED Notes (Signed)
Pt has a hx of HTN  ( Enapril)

## 2015-06-11 NOTE — ED Provider Notes (Signed)
CSN: [id number]     Arrival date & time 06/11/15  0940 History   First MD Initiated Contact with Patient 06/11/15 [phone]     Chief Complaint  Patient presents with  . Fall  . Leg Pain   (Consider location/radiation/quality/duration/timing/severity/associated sxs/prior Treatment) HPI 81 y.o. female with a hx of HTN, presents to the Emergency Department today complaining of a fall on Wednesday. Reports that she was walking towards a ramp until she fell down on her right side. She states that she lost consciousness, but no one was around to witness. She is unsure if the LOC happened before or after the fall. Unsure of the duration of LOC. No hx seizures or stroke. She called out to her son who helped her inside. Pt had continued soreness around her right shoulder and right hip, as well as her chest. She feels as though she broke her ribs on the right side. States that the pain is a 9/10 when moving around. She has shortness of breath. No fevers. Nausea but no vomiting. No chest pain, no abd pain.      Past Medical History  Diagnosis Date  . GERD (gastroesophageal reflux disease)   . Hypertension   . Cervical spine arthritis ([place])   . DJD (degenerative joint disease)   . DM (diabetes mellitus), type 2 ([place])   . Esophageal stricture    Past Surgical History  Procedure Laterality Date  . Abdominal hysterectomy    . Dilation and curettage of uterus    . Tubal ligation    . Left breast lumpectomy    . Benign tumor removed from left hip    . Cholecystectomy    . Total knee arthroplasty      left Jan '12; right May '12 - by Dr. Alvan Dame  . Cataract extraction, bilateral  July 2013    Left done on 12/18/11 and right done on 01/15/2012   Family History  Problem Relation Age of Onset  . Diabetes Father    Social History  Substance Use Topics  . Smoking status: Never Smoker   . Smokeless tobacco: Never Used  . Alcohol Use: No   OB History    No data available     Review of Systems   Constitutional: Negative for fever, chills, diaphoresis and fatigue.  HENT: Negative for congestion, sinus pressure, sore throat and tinnitus.   Eyes: Negative for visual disturbance.  Respiratory: Positive for shortness of breath. Negative for cough.   Cardiovascular: Negative for chest pain.  Gastrointestinal: Positive for nausea. Negative for vomiting, abdominal pain, diarrhea and constipation.  Endocrine: Negative for cold intolerance and heat intolerance.  Musculoskeletal: Positive for arthralgias. Negative for back pain.  Skin: Negative for color change.  Neurological: Positive for syncope. Negative for dizziness, seizures, weakness, numbness and headaches.   Allergies  Vicoprofen; Aspirin; and Codeine  Home Medications   Prior to Admission medications   Medication Sig Start Date End Date Taking? Authorizing Provider  ACCU-CHEK AVIVA PLUS test strip USE TO CHECK BLOOD SUGAR TWICE DAILY 03/17/15   Janith Lima, MD  Blood Glucose Monitoring Suppl (ACCU-CHEK AVIVA PLUS) W/DEVICE KIT Use to check blood sugars daily 01/07/14   Janith Lima, MD  Calcium Carbonate-Vitamin D 600-400 MG-UNIT per tablet Take 1 tablet by mouth daily. 05/01/11   Neena Rhymes, MD  enalapril (VASOTEC) 20 MG tablet Take 1 tablet (20 mg total) by mouth daily. 08/24/14   Janith Lima, MD  furosemide (LASIX) 20 MG tablet  Take 1 tablet (20 mg total) by mouth daily. 08/22/13   Neena Rhymes, MD  glucose blood (ACCU-CHEK AVIVA PLUS) test strip Use to check blood sugar twice a day Dx 250.00 01/07/14   Janith Lima, MD  glucose blood (ACCU-CHEK COMPACT TEST DRUM) test strip Use as instructed 10/29/12   Biagio Borg, MD  Lancets (ACCU-CHEK MULTICLIX) lancets Use to help check blood sugars twice a day Dx 250.00 01/07/14   Janith Lima, MD  meloxicam (MOBIC) 15 MG tablet Take 1 tablet (15 mg total) by mouth daily. 04/30/15   Janith Lima, MD  metFORMIN (GLUCOPHAGE) 1000 MG tablet Take 1 tablet (1,000 mg total)  by mouth 2 (two) times daily with a meal. 08/24/14   Janith Lima, MD  omeprazole (PRILOSEC) 20 MG capsule Take 1 capsule (20 mg total) by mouth daily. 12/02/14   Janith Lima, MD  oxybutynin (DITROPAN-XL) 5 MG 24 hr tablet Take 1 tablet (5 mg total) by mouth daily. 05/05/15   Janith Lima, MD  tiZANidine (ZANAFLEX) 2 MG tablet TAKE 1 TABLET EVERY 6 HOURS AS NEEDED FOR MUSCLE SPASMS 03/31/15   Janith Lima, MD  traMADol (ULTRAM) 50 MG tablet Take 1 tablet (50 mg total) by mouth every 6 (six) hours as needed. 05/05/15   Janith Lima, MD   BP 199/102 mmHg  Pulse 83  Temp(Src) 98.1 F (36.7 C) (Oral)  Resp 16  Ht 5' (1.524 m)  Wt 69.4 kg  BMI 29.88 kg/m2  SpO2 99%   Physical Exam  Constitutional: She is oriented to person, place, and time. She appears well-developed and well-nourished.  HENT:  Head: Normocephalic and atraumatic. Head is without raccoon's eyes, without Battle's sign, without contusion, without laceration, without right periorbital erythema and without left periorbital erythema.  Right Ear: Hearing and tympanic membrane normal. No hemotympanum.  Left Ear: Hearing and tympanic membrane normal. No hemotympanum.  Nose: Nose normal.  Mouth/Throat: Uvula is midline, oropharynx is clear and moist and mucous membranes are normal.  Eyes: Conjunctivae and EOM are normal. Pupils are equal, round, and reactive to light.  Neck: Normal range of motion. Neck supple. No spinous process tenderness and no muscular tenderness present. Normal range of motion present.  Cardiovascular: Normal rate, regular rhythm, S1 normal, S2 normal and normal pulses.   Pulmonary/Chest: Effort normal and breath sounds normal.  Tenderness upon palpation of ribs 9-12 on the right side  Abdominal: Soft.  Musculoskeletal:       Right shoulder: She exhibits decreased range of motion, tenderness and pain.       Left shoulder: Normal.       Right elbow: Normal.      Left elbow: Normal.       Right wrist:  Normal.       Left wrist: Normal.       Right hip: She exhibits decreased range of motion and tenderness.       Left hip: Normal.       Right knee: Normal.       Left knee: Normal.       Right ankle: Normal.       Left ankle: Normal.       Cervical back: Normal.       Thoracic back: Normal.       Lumbar back: Normal.  Pt able to ambulate well  Neurological: She is alert and oriented to person, place, and time. She has normal strength. No cranial  nerve deficit or sensory deficit. She displays a negative Romberg sign.  Skin: Skin is warm and dry.  Psychiatric: She has a normal mood and affect. Her behavior is normal. Thought content normal.  Nursing note and vitals reviewed.   ED Course  Procedures (including critical care time) Labs Review Labs Reviewed  BASIC METABOLIC PANEL - Abnormal; Notable for the following:    Chloride 100 (*)    Glucose, Bld 176 (*)    GFR calc non Af Amer 51 (*)    GFR calc Af Amer 60 (*)    All other components within normal limits  CBG MONITORING, ED - Abnormal; Notable for the following:    Glucose-Capillary 163 (*)    All other components within normal limits  CBC  TROPONIN I   Imaging Review Dg Chest 2 View  06/11/2015  CLINICAL DATA:  81 year old female who fell off of ran but home 2 days ago. Right side pain. Initial encounter. EXAM: CHEST  2 VIEW COMPARISON:  10/14/2013 and earlier. FINDINGS: Semi upright AP and lateral views of the chest. Chronic right clavicle deformity. Osteopenia. Chronic posterior right third and fourth rib fractures. No acute osseous abnormality identified. Stable lung volumes. Normal cardiac size and mediastinal contours. Calcified aortic atherosclerosis. Stable surgical clips in the right upper abdomen. There are small bilateral pleural effusions. IMPRESSION: 1. Small bilateral pleural effusions appear new. 2. Osteopenia with chronic right side clavicle and rib fractures. No definite acute traumatic injury identified.  Electronically Signed   By: Genevie Ann M.D.   On: 06/11/2015 12:01   Dg Shoulder Right  06/11/2015  CLINICAL DATA:  Pain following fall 2 days prior EXAM: RIGHT SHOULDER - 2+ VIEW COMPARISON:  None. FINDINGS: Frontal and Y scapular images obtained. There is evidence of a prior fracture of the mid right clavicle with remodeling. There is no demonstrable acute fracture or dislocation. There is generalized osteoarthritic change. No erosive change. Bones are osteoporotic. IMPRESSION: Old healed fracture with remodeling mid right clavicle. No acute fracture or dislocation. Generalized osteoporosis with generalized osteoarthritic change, most severe in the glenohumeral joint region. Electronically Signed   By: Lowella Grip III M.D.   On: 06/11/2015 12:00   Ct Head Wo Contrast  06/11/2015  CLINICAL DATA:  Status post fall with loss of consciousness 2 days ago. EXAM: CT HEAD WITHOUT CONTRAST CT CERVICAL SPINE WITHOUT CONTRAST TECHNIQUE: Multidetector CT imaging of the head and cervical spine was performed following the standard protocol without intravenous contrast. Multiplanar CT image reconstructions of the cervical spine were also generated. COMPARISON:  None. FINDINGS: CT HEAD FINDINGS No mass effect or midline shift. No evidence of acute intracranial hemorrhage, or infarction. No abnormal extra-axial fluid collections. Gray-white matter differentiation is normal. Basal cisterns are preserved. There is moderate brain parenchymal atrophy and chronic small vessel disease changes. Atherosclerotic calcifications of the vertebral and basilar arteries are again seen. No depressed skull fractures. Visualized paranasal sinuses and mastoid air cells are not opacified. CT CERVICAL SPINE FINDINGS There is no evidence for acute fracture or dislocation. There are moderate-to-severe multilevel osteoarthritic changes of the cervical spine, with remodeling of the vertebral bodies, disc space loss and large osteophytes and  heterotopic calcifications formation. Prevertebral soft tissues have a normal appearance. Lung apices have a normal appearance. IMPRESSION: No acute intracranial abnormality. Atrophy, chronic microvascular disease. No evidence of fracture subluxation of the cervical spine. Moderate to severe multilevel osteoarthritic changes of the cervical spine. Electronically Signed   By: Fidela Salisbury  M.D.   On: 06/11/2015 11:41   Ct Cervical Spine Wo Contrast  06/11/2015  CLINICAL DATA:  Status post fall with loss of consciousness 2 days ago. EXAM: CT HEAD WITHOUT CONTRAST CT CERVICAL SPINE WITHOUT CONTRAST TECHNIQUE: Multidetector CT imaging of the head and cervical spine was performed following the standard protocol without intravenous contrast. Multiplanar CT image reconstructions of the cervical spine were also generated. COMPARISON:  None. FINDINGS: CT HEAD FINDINGS No mass effect or midline shift. No evidence of acute intracranial hemorrhage, or infarction. No abnormal extra-axial fluid collections. Gray-white matter differentiation is normal. Basal cisterns are preserved. There is moderate brain parenchymal atrophy and chronic small vessel disease changes. Atherosclerotic calcifications of the vertebral and basilar arteries are again seen. No depressed skull fractures. Visualized paranasal sinuses and mastoid air cells are not opacified. CT CERVICAL SPINE FINDINGS There is no evidence for acute fracture or dislocation. There are moderate-to-severe multilevel osteoarthritic changes of the cervical spine, with remodeling of the vertebral bodies, disc space loss and large osteophytes and heterotopic calcifications formation. Prevertebral soft tissues have a normal appearance. Lung apices have a normal appearance. IMPRESSION: No acute intracranial abnormality. Atrophy, chronic microvascular disease. No evidence of fracture subluxation of the cervical spine. Moderate to severe multilevel osteoarthritic changes of  the cervical spine. Electronically Signed   By: Fidela Salisbury M.D.   On: 06/11/2015 11:41   Dg Humerus Right  06/11/2015  CLINICAL DATA:  Pain following fall EXAM: RIGHT HUMERUS - 2+ VIEW COMPARISON:  None. FINDINGS: Frontal and lateral views were obtained. There is no demonstrable acute fracture or dislocation. Bones are osteoporotic. There is moderate osteoarthritic change in the right shoulder and elbow regions. IMPRESSION: Arthropathy in the right shoulder and elbow. Bones osteoporotic. No acute fracture or dislocation evident. Electronically Signed   By: Lowella Grip III M.D.   On: 06/11/2015 12:02   Dg Hips Bilat With Pelvis Min 5 Views  06/11/2015  CLINICAL DATA:  81 year old female fell from a ramp at home 2 days ago with bilateral hip pain. Initial encounter. EXAM: DG HIP (WITH OR WITHOUT PELVIS) 5+V BILAT COMPARISON:  Abdominal radiographs 10/14/2013. FINDINGS: Extensive chronic enthesophytosis about the pelvis. Osteopenia. Pelvis intact. SI joints appear stable. Both femoral heads are normally located. Bilateral hip joint space loss and osteoarthritis. Right proximal femur appears intact. Calcified right femoral artery atherosclerosis. Proximal left femur appears intact. Calcified left femoral artery atherosclerosis. IMPRESSION: No acute fracture or dislocation identified about the bilateral hips or pelvis. Electronically Signed   By: Genevie Ann M.D.   On: 06/11/2015 12:03   I have personally reviewed and evaluated these images and lab results as part of my medical decision-making.    EKG Interpretation   Date/Time:  Friday June 11 2015 10:43:22 EST Ventricular Rate:  81 PR Interval:  144 QRS Duration: 91 QT Interval:  357 QTC Calculation: 414 R Axis:   19 Text Interpretation:  Sinus rhythm T wave inversions in I and aVL new  compared to previous Reconfirmed by NGUYEN, EMILY ([id number]) on 06/11/2015  10:56:08 AM     MDM  I have reviewed relevant laboratory values. I  have reviewed relevant imaging studies.I have reviewed the relevant EKG. I have reviewed the relevant previous healthcare records. I have reviewed EMS Documentation. I obtained HPI from historian. Cases discussed with Attending Physician  ED Course: Spirometer- 1000  Assessment: 83yF with hx htn and dm presents after a fall on Wedensday. Unsure of head trauma. Will CT head and neck  to evaluate. POCT glucose due to hx DM. Will do syncope work up to evaluate cause of LOC. Most likely only minor bruising. Exam does not suggest any acute fractures from fall.   CXR Showed small bilateral pleural effusions. No acute injuries. XR shoulder/humerus/Hip/Knee showed no acute abnormalities. CT Neg. CT Cspine Neg. Given incentive spirometer due to rib contusions. EKG showed T wave inversions. Told to follow up with PCP to have evaluated and possible referral to Cardiologist. Troponin was negative. No symptoms of chest pain in ED. VS stable. Patient in NAD.   Disposition/Plan:  D/C home with Lidoderm patch for pain control  Additional Verbal discharge instructions given and discussed with patient.  Pt Instructed to f/u with PCP in the next 48 hours for evaluation and treatment of symptoms and LOC.    Patient was discussed with Harvel Quale, MD   Final diagnoses:  Rib contusion, right, initial encounter  Muscle strain       Shary Decamp, PA-C 06/11/15 2013  Harvel Quale, MD 06/15/15 [phone]

## 2015-06-11 NOTE — ED Notes (Signed)
EDPA TYLER at bedside. Pt to ask questions at discharge. All questions answered. Pt verbalized understanding with EDPA in room and is OK with going home

## 2015-06-15 ENCOUNTER — Telehealth: Payer: Self-pay | Admitting: Internal Medicine

## 2015-06-15 ENCOUNTER — Ambulatory Visit (INDEPENDENT_AMBULATORY_CARE_PROVIDER_SITE_OTHER): Payer: Commercial Managed Care - HMO | Admitting: Internal Medicine

## 2015-06-15 ENCOUNTER — Encounter: Payer: Self-pay | Admitting: Internal Medicine

## 2015-06-15 VITALS — BP 148/92 | HR 84 | Temp 98.5°F | Resp 16 | Ht 60.0 in | Wt 153.0 lb

## 2015-06-15 DIAGNOSIS — M150 Primary generalized (osteo)arthritis: Secondary | ICD-10-CM | POA: Diagnosis not present

## 2015-06-15 DIAGNOSIS — M47816 Spondylosis without myelopathy or radiculopathy, lumbar region: Secondary | ICD-10-CM | POA: Diagnosis not present

## 2015-06-15 DIAGNOSIS — M47897 Other spondylosis, lumbosacral region: Secondary | ICD-10-CM

## 2015-06-15 DIAGNOSIS — M159 Polyosteoarthritis, unspecified: Secondary | ICD-10-CM

## 2015-06-15 MED ORDER — FENTANYL 12 MCG/HR TD PT72: 12.5000 ug | MEDICATED_PATCH | TRANSDERMAL | Status: DC

## 2015-06-15 MED ORDER — TRAMADOL HCL 50 MG PO TABS: 50.0000 mg | ORAL_TABLET | Freq: Four times a day (QID) | ORAL | Status: DC | PRN

## 2015-06-15 NOTE — Telephone Encounter (Signed)
Pt stated that pain patch that Dr. Ronnald Ramp gave her will cost $129, this is too expensive, can Dr. Ronnald Ramp give her something else that will be cheaper. Please call pt back

## 2015-06-15 NOTE — Progress Notes (Signed)
Subjective:  Patient ID: Andrea Wilkinson, female    DOB: 1931-11-02  Age: 81 y.o. MRN: [id number]  CC: Osteoarthritis   HPI Andrea Wilkinson presents for f/up after a fall about 1 week ago - she was seen in the ER and multiple xrays were normal. She is not getting much pain relief with tramadol and mobic, she was given an Rx for lidocaine patches but they are too expensive for her to try.  Outpatient Prescriptions Prior to Visit  Medication Sig Dispense Refill  . ACCU-CHEK AVIVA PLUS test strip USE TO CHECK BLOOD SUGAR TWICE DAILY 200 each 3  . beta carotene w/minerals (OCUVITE) tablet Take 1 tablet by mouth daily.    . Blood Glucose Monitoring Suppl (ACCU-CHEK AVIVA PLUS) W/DEVICE KIT Use to check blood sugars daily 1 kit 0  . enalapril (VASOTEC) 20 MG tablet Take 1 tablet (20 mg total) by mouth daily. 90 tablet 3  . furosemide (LASIX) 20 MG tablet Take 1 tablet (20 mg total) by mouth daily. 90 tablet 3  . glucose blood (ACCU-CHEK AVIVA PLUS) test strip Use to check blood sugar twice a day Dx 250.00 180 each 3  . glucose blood (ACCU-CHEK COMPACT TEST DRUM) test strip Use as instructed 100 each 12  . Hyprom-Naphaz-Polysorb-Zn Sulf (CLEAR EYES COMPLETE) SOLN Place 1 drop into both eyes as needed (for dry eyes).    . Lancets (ACCU-CHEK MULTICLIX) lancets Use to help check blood sugars twice a day Dx 250.00 180 each 3  . meloxicam (MOBIC) 15 MG tablet Take 1 tablet (15 mg total) by mouth daily. 90 tablet 1  . metFORMIN (GLUCOPHAGE) 1000 MG tablet Take 1 tablet (1,000 mg total) by mouth 2 (two) times daily with a meal. 180 tablet 3  . omeprazole (PRILOSEC) 20 MG capsule Take 1 capsule (20 mg total) by mouth daily. 90 capsule 3  . oxybutynin (DITROPAN-XL) 5 MG 24 hr tablet Take 1 tablet (5 mg total) by mouth daily. (Patient taking differently: Take 5 mg by mouth daily as needed (for frequency). ) 90 tablet 3  . tiZANidine (ZANAFLEX) 2 MG tablet TAKE 1 TABLET EVERY 6 HOURS AS NEEDED FOR MUSCLE  SPASMS (Patient taking differently: take 1 tablet by mouth every night at bedtime) 120 tablet 3  . traMADol (ULTRAM) 50 MG tablet Take 1 tablet (50 mg total) by mouth every 6 (six) hours as needed. 360 tablet 1  . lidocaine (LIDODERM) 5 % Place 1 patch onto the skin daily. Remove & Discard patch within 12 hours or as directed by MD (Patient not taking: Reported on 06/15/2015) 30 patch 0   No facility-administered medications prior to visit.    ROS Review of Systems  Constitutional: Positive for fatigue. Negative for fever, chills, diaphoresis, appetite change and unexpected weight change.  HENT: Negative.   Eyes: Negative.   Respiratory: Negative.  Negative for cough, choking, chest tightness, shortness of breath and stridor.   Cardiovascular: Negative.  Negative for chest pain, palpitations and leg swelling.  Gastrointestinal: Negative.  Negative for nausea, vomiting, abdominal pain, diarrhea and constipation.  Endocrine: Negative.   Genitourinary: Negative.   Musculoskeletal: Positive for myalgias and arthralgias (hips, shoulders, knees). Negative for back pain and neck pain.  Skin: Negative.  Negative for color change and rash.  Allergic/Immunologic: Negative.   Neurological: Negative.  Negative for dizziness, syncope, speech difficulty, light-headedness, numbness and headaches.  Hematological: Negative.  Negative for adenopathy. Does not bruise/bleed easily.  Psychiatric/Behavioral: Negative.  Objective:  BP 148/92 mmHg  Pulse 84  Temp(Src) 98.5 F (36.9 C) (Oral)  Resp 16  Ht 5' (1.524 m)  Wt 153 lb (69.4 kg)  BMI 29.88 kg/m2  SpO2 97%  BP Readings from Last 3 Encounters:  06/15/15 148/92  06/11/15 154/75  05/05/15 148/86    Wt Readings from Last 3 Encounters:  06/15/15 153 lb (69.4 kg)  06/11/15 153 lb (69.4 kg)  05/05/15 154 lb (69.854 kg)    Physical Exam  Constitutional: She is oriented to person, place, and time. No distress.  HENT:  Head: Normocephalic  and atraumatic.  Mouth/Throat: Oropharynx is clear and moist. No oropharyngeal exudate.  Eyes: Conjunctivae are normal. Right eye exhibits no discharge. Left eye exhibits no discharge. No scleral icterus.  Neck: Normal range of motion. Neck supple. No JVD present. No tracheal deviation present. No thyromegaly present.  Cardiovascular: Normal rate, regular rhythm, normal heart sounds and intact distal pulses.  Exam reveals no gallop and no friction rub.   No murmur heard. Pulmonary/Chest: Effort normal and breath sounds normal. No stridor. No respiratory distress. She has no wheezes. She has no rales. She exhibits no tenderness.  Abdominal: Soft. Bowel sounds are normal. She exhibits no distension and no mass. There is no tenderness. There is no rebound and no guarding.  Musculoskeletal: Normal range of motion. She exhibits no edema.       Right shoulder: She exhibits tenderness. She exhibits normal range of motion and no deformity.       Left shoulder: She exhibits tenderness. She exhibits normal range of motion and no deformity.       Right hip: She exhibits tenderness. She exhibits normal strength and no bony tenderness.       Left hip: She exhibits tenderness. She exhibits normal strength and no bony tenderness.       Right knee: She exhibits deformity (DJD). She exhibits normal range of motion and no swelling. Tenderness found.       Left knee: She exhibits deformity (DJD). She exhibits normal range of motion and no swelling. Tenderness found.  Lymphadenopathy:    She has no cervical adenopathy.  Neurological: She is oriented to person, place, and time.  Skin: Skin is warm and dry. No rash noted. She is not diaphoretic. No erythema. No pallor.  Vitals reviewed.   Lab Results  Component Value Date   WBC 8.0 06/11/2015   HGB 12.7 06/11/2015   HCT 39.9 06/11/2015   PLT 241 06/11/2015   GLUCOSE 176* 06/11/2015   CHOL 178 05/05/2015   TRIG 252.0* 05/05/2015   HDL 47.00 05/05/2015    LDLDIRECT 90.0 05/05/2015   LDLCALC 119* 09/24/2013   ALT 14 04/07/2014   AST 18 04/07/2014   NA 141 06/11/2015   K 4.1 06/11/2015   CL 100* 06/11/2015   CREATININE 1.00 06/11/2015   BUN 13 06/11/2015   CO2 29 06/11/2015   TSH 0.95 04/07/2014   INR 1.00 10/31/2010   HGBA1C 7.5* 05/05/2015   MICROALBUR 0.8 08/17/2014    Dg Chest 2 View  06/11/2015  CLINICAL DATA:  81 year old female who fell off of ran but home 2 days ago. Right side pain. Initial encounter. EXAM: CHEST  2 VIEW COMPARISON:  10/14/2013 and earlier. FINDINGS: Semi upright AP and lateral views of the chest. Chronic right clavicle deformity. Osteopenia. Chronic posterior right third and fourth rib fractures. No acute osseous abnormality identified. Stable lung volumes. Normal cardiac size and mediastinal contours. Calcified aortic atherosclerosis.  Stable surgical clips in the right upper abdomen. There are small bilateral pleural effusions. IMPRESSION: 1. Small bilateral pleural effusions appear new. 2. Osteopenia with chronic right side clavicle and rib fractures. No definite acute traumatic injury identified. Electronically Signed   By: Genevie Ann M.D.   On: 06/11/2015 12:01   Dg Shoulder Right  06/11/2015  CLINICAL DATA:  Pain following fall 2 days prior EXAM: RIGHT SHOULDER - 2+ VIEW COMPARISON:  None. FINDINGS: Frontal and Y scapular images obtained. There is evidence of a prior fracture of the mid right clavicle with remodeling. There is no demonstrable acute fracture or dislocation. There is generalized osteoarthritic change. No erosive change. Bones are osteoporotic. IMPRESSION: Old healed fracture with remodeling mid right clavicle. No acute fracture or dislocation. Generalized osteoporosis with generalized osteoarthritic change, most severe in the glenohumeral joint region. Electronically Signed   By: Lowella Grip III M.D.   On: 06/11/2015 12:00   Ct Head Wo Contrast  06/11/2015  CLINICAL DATA:  Status post fall with  loss of consciousness 2 days ago. EXAM: CT HEAD WITHOUT CONTRAST CT CERVICAL SPINE WITHOUT CONTRAST TECHNIQUE: Multidetector CT imaging of the head and cervical spine was performed following the standard protocol without intravenous contrast. Multiplanar CT image reconstructions of the cervical spine were also generated. COMPARISON:  None. FINDINGS: CT HEAD FINDINGS No mass effect or midline shift. No evidence of acute intracranial hemorrhage, or infarction. No abnormal extra-axial fluid collections. Gray-white matter differentiation is normal. Basal cisterns are preserved. There is moderate brain parenchymal atrophy and chronic small vessel disease changes. Atherosclerotic calcifications of the vertebral and basilar arteries are again seen. No depressed skull fractures. Visualized paranasal sinuses and mastoid air cells are not opacified. CT CERVICAL SPINE FINDINGS There is no evidence for acute fracture or dislocation. There are moderate-to-severe multilevel osteoarthritic changes of the cervical spine, with remodeling of the vertebral bodies, disc space loss and large osteophytes and heterotopic calcifications formation. Prevertebral soft tissues have a normal appearance. Lung apices have a normal appearance. IMPRESSION: No acute intracranial abnormality. Atrophy, chronic microvascular disease. No evidence of fracture subluxation of the cervical spine. Moderate to severe multilevel osteoarthritic changes of the cervical spine. Electronically Signed   By: Fidela Salisbury M.D.   On: 06/11/2015 11:41   Ct Cervical Spine Wo Contrast  06/11/2015  CLINICAL DATA:  Status post fall with loss of consciousness 2 days ago. EXAM: CT HEAD WITHOUT CONTRAST CT CERVICAL SPINE WITHOUT CONTRAST TECHNIQUE: Multidetector CT imaging of the head and cervical spine was performed following the standard protocol without intravenous contrast. Multiplanar CT image reconstructions of the cervical spine were also generated.  COMPARISON:  None. FINDINGS: CT HEAD FINDINGS No mass effect or midline shift. No evidence of acute intracranial hemorrhage, or infarction. No abnormal extra-axial fluid collections. Gray-white matter differentiation is normal. Basal cisterns are preserved. There is moderate brain parenchymal atrophy and chronic small vessel disease changes. Atherosclerotic calcifications of the vertebral and basilar arteries are again seen. No depressed skull fractures. Visualized paranasal sinuses and mastoid air cells are not opacified. CT CERVICAL SPINE FINDINGS There is no evidence for acute fracture or dislocation. There are moderate-to-severe multilevel osteoarthritic changes of the cervical spine, with remodeling of the vertebral bodies, disc space loss and large osteophytes and heterotopic calcifications formation. Prevertebral soft tissues have a normal appearance. Lung apices have a normal appearance. IMPRESSION: No acute intracranial abnormality. Atrophy, chronic microvascular disease. No evidence of fracture subluxation of the cervical spine. Moderate to severe multilevel osteoarthritic  changes of the cervical spine. Electronically Signed   By: Fidela Salisbury M.D.   On: 06/11/2015 11:41   Dg Knee Complete 4 Views Right  06/11/2015  CLINICAL DATA:  Fall at home, right side rib pain, right knee pain EXAM: RIGHT KNEE - COMPLETE 4+ VIEW COMPARISON:  Report 04/19/1998 no images available FINDINGS: Four views of the right knee submitted. No acute fracture or subluxation. There is right knee prosthesis with anatomic alignment. No evidence of prosthesis loosening. IMPRESSION: No acute fracture or subluxation. Right knee prosthesis with anatomic alignment. Electronically Signed   By: Lahoma Crocker M.D.   On: 06/11/2015 12:05   Dg Humerus Right  06/11/2015  CLINICAL DATA:  Pain following fall EXAM: RIGHT HUMERUS - 2+ VIEW COMPARISON:  None. FINDINGS: Frontal and lateral views were obtained. There is no demonstrable  acute fracture or dislocation. Bones are osteoporotic. There is moderate osteoarthritic change in the right shoulder and elbow regions. IMPRESSION: Arthropathy in the right shoulder and elbow. Bones osteoporotic. No acute fracture or dislocation evident. Electronically Signed   By: Lowella Grip III M.D.   On: 06/11/2015 12:02   Dg Hips Bilat With Pelvis Min 5 Views  06/11/2015  CLINICAL DATA:  81 year old female fell from a ramp at home 2 days ago with bilateral hip pain. Initial encounter. EXAM: DG HIP (WITH OR WITHOUT PELVIS) 5+V BILAT COMPARISON:  Abdominal radiographs 10/14/2013. FINDINGS: Extensive chronic enthesophytosis about the pelvis. Osteopenia. Pelvis intact. SI joints appear stable. Both femoral heads are normally located. Bilateral hip joint space loss and osteoarthritis. Right proximal femur appears intact. Calcified right femoral artery atherosclerosis. Proximal left femur appears intact. Calcified left femoral artery atherosclerosis. IMPRESSION: No acute fracture or dislocation identified about the bilateral hips or pelvis. Electronically Signed   By: Genevie Ann M.D.   On: 06/11/2015 12:03    Assessment & Plan:   Andrea Wilkinson was seen today for osteoarthritis.  Diagnoses and all orders for this visit:  Other osteoarthritis of spine, lumbosacral region -     traMADol (ULTRAM) 50 MG tablet; Take 1 tablet (50 mg total) by mouth every 6 (six) hours as needed. -     fentaNYL (DURAGESIC - DOSED MCG/HR) 12 MCG/HR; Place 1 patch (12.5 mcg total) onto the skin every 3 (three) days.  Primary osteoarthritis involving multiple joints -     traMADol (ULTRAM) 50 MG tablet; Take 1 tablet (50 mg total) by mouth every 6 (six) hours as needed. -     fentaNYL (DURAGESIC - DOSED MCG/HR) 12 MCG/HR; Place 1 patch (12.5 mcg total) onto the skin every 3 (three) days.  Spondylosis of lumbar region without myelopathy or radiculopathy -     traMADol (ULTRAM) 50 MG tablet; Take 1 tablet (50 mg total) by mouth  every 6 (six) hours as needed. -     fentaNYL (DURAGESIC - DOSED MCG/HR) 12 MCG/HR; Place 1 patch (12.5 mcg total) onto the skin every 3 (three) days.  I have discontinued Andrea Wilkinson's lidocaine. I am also having her start on fentaNYL. Additionally, I am having her maintain her glucose blood, furosemide, ACCU-CHEK AVIVA PLUS, glucose blood, accu-chek multiclix, enalapril, metFORMIN, omeprazole, ACCU-CHEK AVIVA PLUS, tiZANidine, meloxicam, oxybutynin, beta carotene w/minerals, CLEAR EYES COMPLETE, and traMADol.  Meds ordered this encounter  Medications  . traMADol (ULTRAM) 50 MG tablet    Sig: Take 1 tablet (50 mg total) by mouth every 6 (six) hours as needed.    Dispense:  360 tablet    Refill:  1  . fentaNYL (DURAGESIC - DOSED MCG/HR) 12 MCG/HR    Sig: Place 1 patch (12.5 mcg total) onto the skin every 3 (three) days.    Dispense:  10 patch    Refill:  0     Follow-up: Return in about 4 weeks (around 07/13/2015).  Scarlette Calico, MD

## 2015-06-15 NOTE — Patient Instructions (Signed)

## 2015-06-15 NOTE — Progress Notes (Signed)
Pre visit review using our clinic review tool, if applicable. No additional management support is needed unless otherwise documented below in the visit note. 

## 2015-06-16 NOTE — Telephone Encounter (Signed)
I recommend that she try the pain patch

## 2015-06-16 NOTE — Telephone Encounter (Signed)
Notified pt with md response pt states she does not have $129 she just can't afford it!...Johny Chess

## 2015-06-17 ENCOUNTER — Ambulatory Visit: Payer: Commercial Managed Care - HMO | Admitting: Internal Medicine

## 2015-06-20 DIAGNOSIS — I4891 Unspecified atrial fibrillation: Secondary | ICD-10-CM

## 2015-06-20 DIAGNOSIS — I9789 Other postprocedural complications and disorders of the circulatory system, not elsewhere classified: Secondary | ICD-10-CM

## 2015-06-20 HISTORY — DX: Unspecified atrial fibrillation: I48.91

## 2015-06-20 HISTORY — DX: Other postprocedural complications and disorders of the circulatory system, not elsewhere classified: I97.89

## 2015-07-26 ENCOUNTER — Other Ambulatory Visit: Payer: Self-pay | Admitting: Internal Medicine

## 2015-08-02 ENCOUNTER — Encounter (INDEPENDENT_AMBULATORY_CARE_PROVIDER_SITE_OTHER): Payer: Commercial Managed Care - HMO | Admitting: Ophthalmology

## 2015-08-02 DIAGNOSIS — D3132 Benign neoplasm of left choroid: Secondary | ICD-10-CM

## 2015-08-02 DIAGNOSIS — E11319 Type 2 diabetes mellitus with unspecified diabetic retinopathy without macular edema: Secondary | ICD-10-CM

## 2015-08-02 DIAGNOSIS — E113293 Type 2 diabetes mellitus with mild nonproliferative diabetic retinopathy without macular edema, bilateral: Secondary | ICD-10-CM | POA: Diagnosis not present

## 2015-08-02 DIAGNOSIS — H353132 Nonexudative age-related macular degeneration, bilateral, intermediate dry stage: Secondary | ICD-10-CM | POA: Diagnosis not present

## 2015-08-02 DIAGNOSIS — H35033 Hypertensive retinopathy, bilateral: Secondary | ICD-10-CM

## 2015-08-02 DIAGNOSIS — H35372 Puckering of macula, left eye: Secondary | ICD-10-CM | POA: Diagnosis not present

## 2015-08-02 DIAGNOSIS — H43813 Vitreous degeneration, bilateral: Secondary | ICD-10-CM | POA: Diagnosis not present

## 2015-08-02 DIAGNOSIS — I10 Essential (primary) hypertension: Secondary | ICD-10-CM | POA: Diagnosis not present

## 2015-08-02 LAB — HM DIABETES EYE EXAM

## 2015-08-02 NOTE — H&P (Signed)
Andrea Wilkinson is an 82 y.o. female.   Chief Complaint:Loss of vision left eye HPI: Painless loss of vision for three months left eye  Past Medical History  Diagnosis Date  . GERD (gastroesophageal reflux disease)   . Hypertension   . Cervical spine arthritis ([place])   . DJD (degenerative joint disease)   . DM (diabetes mellitus), type 2 ([place])   . Esophageal stricture     Past Surgical History  Procedure Laterality Date  . Abdominal hysterectomy    . Dilation and curettage of uterus    . Tubal ligation    . Left breast lumpectomy    . Benign tumor removed from left hip    . Cholecystectomy    . Total knee arthroplasty      left Jan '12; right May '12 - by Dr. Alvan Dame  . Cataract extraction, bilateral  July 2013    Left done on 12/18/11 and right done on 01/15/2012    Family History  Problem Relation Age of Onset  . Diabetes Father    Social History:  reports that she has never smoked. She has never used smokeless tobacco. She reports that she does not drink alcohol or use illicit drugs.  Allergies:  Allergies  Allergen Reactions  . Vicoprofen [Hydrocodone-Ibuprofen] Rash  . Codeine Nausea And Vomiting  . Aspirin Rash    No prescriptions prior to admission    Review of systems otherwise negative  There were no vitals taken for this visit.  Physical exam: Mental status: oriented x3. Eyes: See eye exam associated with this date of surgery in media tab.  Scanned in by scanning center Ears, Nose, Throat: within normal limits Neck: Within Normal limits General: within normal limits Chest: Within normal limits Breast: deferred Heart: Within normal limits Abdomen: Within normal limits GU: deferred Extremities: within normal limits Skin: within normal limits  Assessment/Plan Preretinal fibrosis left eye.  Diabetic retinopath left eye Plan: To [facility] for Pars plana vitrectomy, membrane peel, laser treatment, gas injection left eye  Hayden Pedro 08/02/2015,  12:37 PM

## 2015-08-06 ENCOUNTER — Encounter (HOSPITAL_COMMUNITY): Payer: Self-pay | Admitting: *Deleted

## 2015-08-06 NOTE — Progress Notes (Signed)
Pt denies cardiac history, chest pain or sob. Pt is diabetic. Last A1C was 7.5 in November/2016. Pt states her fasting blood sugar usually runs around 130.

## 2015-08-09 MED ORDER — PHENYLEPHRINE HCL 2.5 % OP SOLN
1.0000 [drp] | OPHTHALMIC | Status: AC | PRN
  Administered 2015-08-10 (×3): 1 [drp] via OPHTHALMIC
  Filled 2015-08-09: qty 2

## 2015-08-09 MED ORDER — GATIFLOXACIN 0.5 % OP SOLN
1.0000 [drp] | OPHTHALMIC | Status: AC | PRN
  Administered 2015-08-10 (×3): 1 [drp] via OPHTHALMIC
  Filled 2015-08-09: qty 2.5

## 2015-08-09 MED ORDER — CYCLOPENTOLATE HCL 1 % OP SOLN
1.0000 [drp] | OPHTHALMIC | Status: AC | PRN
  Administered 2015-08-10 (×3): 1 [drp] via OPHTHALMIC
  Filled 2015-08-09: qty 2

## 2015-08-09 MED ORDER — CEFAZOLIN SODIUM-DEXTROSE 2-3 GM-% IV SOLR
2.0000 g | INTRAVENOUS | Status: AC
  Administered 2015-08-10: 2 g via INTRAVENOUS
  Filled 2015-08-09: qty 50

## 2015-08-09 MED ORDER — TROPICAMIDE 1 % OP SOLN
1.0000 [drp] | OPHTHALMIC | Status: AC | PRN
  Administered 2015-08-10 (×3): 1 [drp] via OPHTHALMIC
  Filled 2015-08-09: qty 3

## 2015-08-09 NOTE — Progress Notes (Signed)
Anesthesia Chart Review:  Pt is an 82 year old female scheduled for L pars plana vitrectomy on 08/10/2015 with Dr. Zigmund Daniel.   Pt is a same day work up.   PCP is Dr. Scarlette Calico.   PMH includes:  HTN, DM, GERD. Never smoker.  Medications include: enalapril, lasix, metformin, prilosec  Labs will be obtained DOS.   Chest x-ray 06/11/15 reviewed.  1. Small bilateral pleural effusions appear new. 2. Osteopenia with chronic right side clavicle and rib fractures. No definite acute traumatic injury identified.  EKG 06/11/15: Sinus rhythm. T wave inversions in I and aVL new compared to previous EKG 04/07/14.   Reviewed case with Dr. Therisa Doyne. He asked that Dr. Ronnald Ramp be contacted for input on EKG changes. I spoke with Dr. Ronnald Ramp by telephone, and he felt pt would be ok to proceed with this surgery.   If labs acceptable DOS, I anticipate pt can proceed as scheduled.   Willeen Cass, FNP-BC [facility]/Anesthesiology Phone: [phone] 08/09/2015 2:09 PM

## 2015-08-10 ENCOUNTER — Encounter (HOSPITAL_COMMUNITY): Payer: Self-pay | Admitting: *Deleted

## 2015-08-10 ENCOUNTER — Ambulatory Visit (HOSPITAL_COMMUNITY): Payer: Commercial Managed Care - HMO | Admitting: Emergency Medicine

## 2015-08-10 ENCOUNTER — Ambulatory Visit (HOSPITAL_COMMUNITY)
Admission: RE | Admit: 2015-08-10 | Discharge: 2015-08-11 | Disposition: A | Discharge status: Home / Self Care | Payer: Commercial Managed Care - HMO | Source: Ambulatory Visit | Attending: Ophthalmology | Admitting: Ophthalmology

## 2015-08-10 ENCOUNTER — Encounter (HOSPITAL_COMMUNITY)
Admission: RE | Disposition: A | Discharge status: Home / Self Care | Payer: Self-pay | Source: Ambulatory Visit | Attending: Ophthalmology

## 2015-08-10 DIAGNOSIS — H35362 Drusen (degenerative) of macula, left eye: Secondary | ICD-10-CM | POA: Insufficient documentation

## 2015-08-10 DIAGNOSIS — Z96653 Presence of artificial knee joint, bilateral: Secondary | ICD-10-CM | POA: Insufficient documentation

## 2015-08-10 DIAGNOSIS — E11319 Type 2 diabetes mellitus with unspecified diabetic retinopathy without macular edema: Secondary | ICD-10-CM | POA: Diagnosis not present

## 2015-08-10 DIAGNOSIS — Z9841 Cataract extraction status, right eye: Secondary | ICD-10-CM | POA: Diagnosis not present

## 2015-08-10 DIAGNOSIS — Z79899 Other long term (current) drug therapy: Secondary | ICD-10-CM | POA: Insufficient documentation

## 2015-08-10 DIAGNOSIS — I10 Essential (primary) hypertension: Secondary | ICD-10-CM | POA: Diagnosis not present

## 2015-08-10 DIAGNOSIS — H35342 Macular cyst, hole, or pseudohole, left eye: Secondary | ICD-10-CM | POA: Diagnosis not present

## 2015-08-10 DIAGNOSIS — Z9842 Cataract extraction status, left eye: Secondary | ICD-10-CM | POA: Insufficient documentation

## 2015-08-10 DIAGNOSIS — Z791 Long term (current) use of non-steroidal anti-inflammatories (NSAID): Secondary | ICD-10-CM | POA: Insufficient documentation

## 2015-08-10 DIAGNOSIS — K219 Gastro-esophageal reflux disease without esophagitis: Secondary | ICD-10-CM | POA: Insufficient documentation

## 2015-08-10 DIAGNOSIS — H35372 Puckering of macula, left eye: Secondary | ICD-10-CM | POA: Diagnosis not present

## 2015-08-10 HISTORY — DX: Depression, unspecified: F32.A

## 2015-08-10 HISTORY — PX: MEMBRANE PEEL: SHX5967

## 2015-08-10 HISTORY — DX: Personal history of other diseases of the digestive system: Z87.19

## 2015-08-10 HISTORY — DX: Major depressive disorder, single episode, unspecified: F32.9

## 2015-08-10 HISTORY — DX: Type 2 diabetes mellitus with unspecified diabetic retinopathy without macular edema: E11.319

## 2015-08-10 HISTORY — PX: AIR/FLUID EXCHANGE: SHX6494

## 2015-08-10 HISTORY — PX: LASER PHOTO ABLATION: SHX5942

## 2015-08-10 HISTORY — PX: PARS PLANA VITRECTOMY: SHX2166

## 2015-08-10 HISTORY — DX: Malignant (primary) neoplasm, unspecified: C80.1

## 2015-08-10 LAB — BASIC METABOLIC PANEL
Anion gap: 10 (ref 5–15)
BUN: 16 mg/dL (ref 6–20)
CALCIUM: 9.6 mg/dL (ref 8.9–10.3)
CHLORIDE: 103 mmol/L (ref 101–111)
CO2: 28 mmol/L (ref 22–32)
CREATININE: 1.24 mg/dL — AB (ref 0.44–1.00)
GFR calc non Af Amer: 40 mL/min — ABNORMAL LOW (ref >60)
GFR, EST AFRICAN AMERICAN: 46 mL/min — AB (ref >60)
Glucose, Bld: 132 mg/dL — ABNORMAL HIGH (ref 65–99)
Potassium: 4.7 mmol/L (ref 3.5–5.1)
SODIUM: 141 mmol/L (ref 135–145)

## 2015-08-10 LAB — GLUCOSE, CAPILLARY
GLUCOSE-CAPILLARY: 112 mg/dL — AB (ref 65–99)
GLUCOSE-CAPILLARY: 124 mg/dL — AB (ref 65–99)
GLUCOSE-CAPILLARY: 127 mg/dL — AB (ref 65–99)
GLUCOSE-CAPILLARY: 181 mg/dL — AB (ref 65–99)
GLUCOSE-CAPILLARY: 205 mg/dL — AB (ref 65–99)
Glucose-Capillary: 289 mg/dL — ABNORMAL HIGH (ref 65–99)

## 2015-08-10 LAB — HEMOGLOBIN: HEMOGLOBIN: 11.7 g/dL — AB (ref 12.0–15.0)

## 2015-08-10 SURGERY — PARS PLANA VITRECTOMY WITH 25 GAUGE
Anesthesia: General | Site: Eye | Laterality: Left

## 2015-08-10 MED ORDER — TEMAZEPAM 15 MG PO CAPS: 15.0000 mg | ORAL_CAPSULE | Freq: Every evening | ORAL | Status: DC | PRN

## 2015-08-10 MED ORDER — CEFTAZIDIME 1 G IJ SOLR
INTRAMUSCULAR | Status: AC
  Filled 2015-08-10: qty 1

## 2015-08-10 MED ORDER — STERILE WATER FOR INJECTION IJ SOLN
INTRAMUSCULAR | Status: AC
  Filled 2015-08-10: qty 20

## 2015-08-10 MED ORDER — ONDANSETRON HCL 4 MG/2ML IJ SOLN
4.0000 mg | Freq: Four times a day (QID) | INTRAMUSCULAR | Status: DC
  Administered 2015-08-11: 4 mg via INTRAVENOUS
  Filled 2015-08-10: qty 2

## 2015-08-10 MED ORDER — BSS IO SOLN
INTRAOCULAR | Status: AC
  Filled 2015-08-10: qty 15

## 2015-08-10 MED ORDER — FENTANYL CITRATE (PF) 100 MCG/2ML IJ SOLN
INTRAMUSCULAR | Status: DC | PRN
  Administered 2015-08-10 (×4): 50 ug via INTRAVENOUS

## 2015-08-10 MED ORDER — ONDANSETRON HCL 4 MG/2ML IJ SOLN
INTRAMUSCULAR | Status: DC | PRN
  Administered 2015-08-10: 4 mg via INTRAVENOUS

## 2015-08-10 MED ORDER — SODIUM HYALURONATE 10 MG/ML IO SOLN
INTRAOCULAR | Status: DC | PRN
  Administered 2015-08-10: 0.85 mL via INTRAOCULAR

## 2015-08-10 MED ORDER — LATANOPROST 0.005 % OP SOLN
1.0000 [drp] | Freq: Every day | OPHTHALMIC | Status: DC
  Filled 2015-08-10: qty 2.5

## 2015-08-10 MED ORDER — EPINEPHRINE HCL 1 MG/ML IJ SOLN
INTRAMUSCULAR | Status: AC
  Filled 2015-08-10: qty 1

## 2015-08-10 MED ORDER — GATIFLOXACIN 0.5 % OP SOLN
1.0000 [drp] | Freq: Four times a day (QID) | OPHTHALMIC | Status: DC
  Filled 2015-08-10: qty 2.5

## 2015-08-10 MED ORDER — POLYVINYL ALCOHOL 1.4 % OP SOLN
1.0000 [drp] | OPHTHALMIC | Status: DC | PRN
  Filled 2015-08-10: qty 15

## 2015-08-10 MED ORDER — ROCURONIUM BROMIDE 100 MG/10ML IV SOLN
INTRAVENOUS | Status: DC | PRN
  Administered 2015-08-10: 40 mg via INTRAVENOUS

## 2015-08-10 MED ORDER — BUPIVACAINE HCL (PF) 0.75 % IJ SOLN
INTRAMUSCULAR | Status: AC
  Filled 2015-08-10: qty 10

## 2015-08-10 MED ORDER — OCUVITE PO TABS
1.0000 | ORAL_TABLET | Freq: Every day | ORAL | Status: DC
  Administered 2015-08-10: 1 via ORAL
  Filled 2015-08-10 (×3): qty 1

## 2015-08-10 MED ORDER — EPINEPHRINE HCL 1 MG/ML IJ SOLN
INTRAMUSCULAR | Status: DC | PRN
  Administered 2015-08-10: 11:00:00

## 2015-08-10 MED ORDER — MORPHINE SULFATE (PF) 2 MG/ML IV SOLN: 1.0000 mg | INTRAVENOUS | Status: DC | PRN

## 2015-08-10 MED ORDER — SODIUM HYALURONATE 10 MG/ML IO SOLN
INTRAOCULAR | Status: AC
  Filled 2015-08-10: qty 0.85

## 2015-08-10 MED ORDER — TRIAMCINOLONE ACETONIDE 40 MG/ML IJ SUSP
INTRAMUSCULAR | Status: AC
  Filled 2015-08-10: qty 5

## 2015-08-10 MED ORDER — ONDANSETRON HCL 4 MG/2ML IJ SOLN
4.0000 mg | Freq: Once | INTRAMUSCULAR | Status: DC | PRN
Start: 2015-08-10 — End: 2015-08-10

## 2015-08-10 MED ORDER — BACITRACIN-POLYMYXIN B 500-10000 UNIT/GM OP OINT
1.0000 "application " | TOPICAL_OINTMENT | Freq: Three times a day (TID) | OPHTHALMIC | Status: DC
  Filled 2015-08-10: qty 3.5

## 2015-08-10 MED ORDER — FENTANYL CITRATE (PF) 250 MCG/5ML IJ SOLN
INTRAMUSCULAR | Status: AC
  Filled 2015-08-10: qty 5

## 2015-08-10 MED ORDER — MAGNESIUM HYDROXIDE 400 MG/5ML PO SUSP: 15.0000 mL | Freq: Four times a day (QID) | ORAL | Status: DC | PRN

## 2015-08-10 MED ORDER — BSS PLUS IO SOLN
INTRAOCULAR | Status: AC
  Filled 2015-08-10: qty 500

## 2015-08-10 MED ORDER — PHENYLEPHRINE 40 MCG/ML (10ML) SYRINGE FOR IV PUSH (FOR BLOOD PRESSURE SUPPORT)
PREFILLED_SYRINGE | INTRAVENOUS | Status: AC
  Filled 2015-08-10: qty 10

## 2015-08-10 MED ORDER — FENTANYL CITRATE (PF) 100 MCG/2ML IJ SOLN
25.0000 ug | INTRAMUSCULAR | Status: DC | PRN
  Administered 2015-08-10 (×2): 50 ug via INTRAVENOUS

## 2015-08-10 MED ORDER — SODIUM CHLORIDE 0.45 % IV SOLN
INTRAVENOUS | Status: DC
  Administered 2015-08-10: 17:00:00 via INTRAVENOUS

## 2015-08-10 MED ORDER — LIDOCAINE HCL 4 % MT SOLN
OROMUCOSAL | Status: DC | PRN
  Administered 2015-08-10: 4 mL via TOPICAL

## 2015-08-10 MED ORDER — SODIUM CHLORIDE 0.9 % IJ SOLN
INTRAMUSCULAR | Status: AC
  Filled 2015-08-10: qty 10

## 2015-08-10 MED ORDER — SODIUM CHLORIDE 0.9 % IV SOLN
INTRAVENOUS | Status: DC
  Administered 2015-08-10 (×2): via INTRAVENOUS

## 2015-08-10 MED ORDER — METFORMIN HCL 500 MG PO TABS: 1000.0000 mg | ORAL_TABLET | Freq: Two times a day (BID) | ORAL | Status: DC

## 2015-08-10 MED ORDER — OXYBUTYNIN CHLORIDE ER 5 MG PO TB24
5.0000 mg | ORAL_TABLET | Freq: Every day | ORAL | Status: DC
  Filled 2015-08-10 (×2): qty 1

## 2015-08-10 MED ORDER — PROPOFOL 10 MG/ML IV BOLUS
INTRAVENOUS | Status: DC | PRN
  Administered 2015-08-10: 160 mg via INTRAVENOUS

## 2015-08-10 MED ORDER — HYPROMELLOSE (GONIOSCOPIC) 2.5 % OP SOLN
OPHTHALMIC | Status: AC
  Filled 2015-08-10: qty 15

## 2015-08-10 MED ORDER — STERILE WATER FOR INJECTION IJ SOLN
INTRAMUSCULAR | Status: DC | PRN
  Administered 2015-08-10: 20 mL

## 2015-08-10 MED ORDER — ACETAZOLAMIDE SODIUM 500 MG IJ SOLR
500.0000 mg | Freq: Once | INTRAMUSCULAR | Status: AC
  Administered 2015-08-11: 500 mg via INTRAVENOUS
  Filled 2015-08-10: qty 500

## 2015-08-10 MED ORDER — ENALAPRIL MALEATE 20 MG PO TABS
20.0000 mg | ORAL_TABLET | Freq: Every day | ORAL | Status: DC
  Administered 2015-08-10: 20 mg via ORAL
  Filled 2015-08-10 (×2): qty 1

## 2015-08-10 MED ORDER — BUPIVACAINE HCL (PF) 0.75 % IJ SOLN
INTRAMUSCULAR | Status: DC | PRN
Start: 2015-08-10 — End: 2015-08-10
  Administered 2015-08-10: 10 mL

## 2015-08-10 MED ORDER — TRAMADOL HCL 50 MG PO TABS: 50.0000 mg | ORAL_TABLET | Freq: Four times a day (QID) | ORAL | Status: DC | PRN

## 2015-08-10 MED ORDER — 0.9 % SODIUM CHLORIDE (POUR BTL) OPTIME
TOPICAL | Status: DC | PRN
  Administered 2015-08-10: 200 mL

## 2015-08-10 MED ORDER — PREDNISOLONE ACETATE 1 % OP SUSP
1.0000 [drp] | Freq: Four times a day (QID) | OPHTHALMIC | Status: DC
  Filled 2015-08-10: qty 5

## 2015-08-10 MED ORDER — BACITRACIN-POLYMYXIN B 500-10000 UNIT/GM OP OINT
TOPICAL_OINTMENT | OPHTHALMIC | Status: DC | PRN
  Administered 2015-08-10: 1 via OPHTHALMIC

## 2015-08-10 MED ORDER — STERILE WATER FOR IRRIGATION IR SOLN
Status: DC | PRN
  Administered 2015-08-10: 1000 mL

## 2015-08-10 MED ORDER — ACETAMINOPHEN 325 MG PO TABS: 325.0000 mg | ORAL_TABLET | ORAL | Status: DC | PRN

## 2015-08-10 MED ORDER — METFORMIN HCL 500 MG PO TABS
1000.0000 mg | ORAL_TABLET | Freq: Two times a day (BID) | ORAL | Status: DC
  Administered 2015-08-11: 1000 mg via ORAL
  Filled 2015-08-10: qty 2

## 2015-08-10 MED ORDER — MELOXICAM 7.5 MG PO TABS
15.0000 mg | ORAL_TABLET | Freq: Every day | ORAL | Status: DC
  Administered 2015-08-10: 15 mg via ORAL
  Filled 2015-08-10: qty 2

## 2015-08-10 MED ORDER — FUROSEMIDE 20 MG PO TABS
20.0000 mg | ORAL_TABLET | Freq: Every day | ORAL | Status: DC
  Administered 2015-08-10: 20 mg via ORAL
  Filled 2015-08-10: qty 1

## 2015-08-10 MED ORDER — GLYCOPYRROLATE 0.2 MG/ML IJ SOLN
INTRAMUSCULAR | Status: DC | PRN
  Administered 2015-08-10: 0.6 mg via INTRAVENOUS

## 2015-08-10 MED ORDER — HYDROCODONE-ACETAMINOPHEN 5-325 MG PO TABS: 1.0000 | ORAL_TABLET | ORAL | Status: DC | PRN

## 2015-08-10 MED ORDER — BRIMONIDINE TARTRATE 0.2 % OP SOLN
1.0000 [drp] | Freq: Two times a day (BID) | OPHTHALMIC | Status: DC
  Filled 2015-08-10: qty 5

## 2015-08-10 MED ORDER — POLYMYXIN B SULFATE 500000 UNITS IJ SOLR
INTRAMUSCULAR | Status: AC
  Filled 2015-08-10: qty 1

## 2015-08-10 MED ORDER — TIZANIDINE HCL 2 MG PO TABS
2.0000 mg | ORAL_TABLET | Freq: Every day | ORAL | Status: DC
  Administered 2015-08-10: 2 mg via ORAL
  Filled 2015-08-10: qty 1

## 2015-08-10 MED ORDER — LIDOCAINE HCL (CARDIAC) 20 MG/ML IV SOLN
INTRAVENOUS | Status: DC | PRN
  Administered 2015-08-10: 40 mg via INTRAVENOUS

## 2015-08-10 MED ORDER — DEXAMETHASONE SODIUM PHOSPHATE 10 MG/ML IJ SOLN
INTRAMUSCULAR | Status: AC
  Filled 2015-08-10: qty 1

## 2015-08-10 MED ORDER — ATROPINE SULFATE 1 % OP SOLN
OPHTHALMIC | Status: AC
  Filled 2015-08-10: qty 5

## 2015-08-10 MED ORDER — PANTOPRAZOLE SODIUM 40 MG PO TBEC
40.0000 mg | DELAYED_RELEASE_TABLET | Freq: Every day | ORAL | Status: DC
  Administered 2015-08-10: 40 mg via ORAL
  Filled 2015-08-10: qty 1

## 2015-08-10 MED ORDER — HYALURONIDASE HUMAN 150 UNIT/ML IJ SOLN
INTRAMUSCULAR | Status: AC
  Filled 2015-08-10: qty 1

## 2015-08-10 MED ORDER — CLEAR EYES COMPLETE OP SOLN: 1.0000 [drp] | OPHTHALMIC | Status: DC | PRN

## 2015-08-10 MED ORDER — HEMOSTATIC AGENTS (NO CHARGE) OPTIME
TOPICAL | Status: DC | PRN
  Administered 2015-08-10: 1 via TOPICAL

## 2015-08-10 MED ORDER — MIDAZOLAM HCL 2 MG/2ML IJ SOLN
INTRAMUSCULAR | Status: AC
  Filled 2015-08-10: qty 2

## 2015-08-10 MED ORDER — BACITRACIN-POLYMYXIN B 500-10000 UNIT/GM OP OINT
TOPICAL_OINTMENT | OPHTHALMIC | Status: AC
  Filled 2015-08-10: qty 3.5

## 2015-08-10 MED ORDER — TETRACAINE HCL 0.5 % OP SOLN
2.0000 [drp] | Freq: Once | OPHTHALMIC | Status: DC
  Filled 2015-08-10: qty 2

## 2015-08-10 MED ORDER — INSULIN ASPART 100 UNIT/ML ~~LOC~~ SOLN
0.0000 [IU] | SUBCUTANEOUS | Status: DC
  Administered 2015-08-10: 5 [IU] via SUBCUTANEOUS
  Administered 2015-08-10: 8 [IU] via SUBCUTANEOUS
  Administered 2015-08-11: 3 [IU] via SUBCUTANEOUS
  Administered 2015-08-11: 2 [IU] via SUBCUTANEOUS
  Administered 2015-08-11: 5 [IU] via SUBCUTANEOUS

## 2015-08-10 MED ORDER — DEXAMETHASONE SODIUM PHOSPHATE 10 MG/ML IJ SOLN
INTRAMUSCULAR | Status: DC | PRN
  Administered 2015-08-10: 10 mg

## 2015-08-10 MED ORDER — FENTANYL CITRATE (PF) 100 MCG/2ML IJ SOLN
INTRAMUSCULAR | Status: AC
  Administered 2015-08-10: 50 ug via INTRAVENOUS
  Filled 2015-08-10: qty 2

## 2015-08-10 MED ORDER — NEOSTIGMINE METHYLSULFATE 10 MG/10ML IV SOLN
INTRAVENOUS | Status: DC | PRN
  Administered 2015-08-10: 4 mg via INTRAVENOUS

## 2015-08-10 SURGICAL SUPPLY — 72 items
APL SRG 3 HI ABS STRL LF PLS (MISCELLANEOUS)
APPLICATOR DR MATTHEWS STRL (MISCELLANEOUS) IMPLANT
BALL CTTN LRG ABS STRL LF (GAUZE/BANDAGES/DRESSINGS) ×3
BLADE EYE CATARACT 19 1.4 BEAV (BLADE) IMPLANT
BLADE MVR KNIFE 19G (BLADE) IMPLANT
BLADE MVR KNIFE 20G (BLADE) IMPLANT
CANNULA DUAL BORE 23G (CANNULA) IMPLANT
CANNULA VLV SOFT TIP 25G (OPHTHALMIC) ×1 IMPLANT
CANNULA VLV SOFT TIP 25GA (OPHTHALMIC) ×3 IMPLANT
CORDS BIPOLAR (ELECTRODE) IMPLANT
COTTONBALL LRG STERILE PKG (GAUZE/BANDAGES/DRESSINGS) ×9 IMPLANT
COVER MAYO STAND STRL (DRAPES) IMPLANT
DRAPE INCISE 51X51 W/FILM STRL (DRAPES) IMPLANT
DRAPE OPHTHALMIC 77X100 STRL (CUSTOM PROCEDURE TRAY) ×5 IMPLANT
FILTER BLUE MILLIPORE (MISCELLANEOUS) IMPLANT
FILTER STRAW FLUID ASPIR (MISCELLANEOUS) IMPLANT
FORCEPS ECKARDT ILM 25G SERR (OPHTHALMIC RELATED) IMPLANT
FORCEPS GRIESHABER ILM 25G A (INSTRUMENTS) ×2 IMPLANT
GLOVE SS BIOGEL STRL SZ 6.5 (GLOVE) ×1 IMPLANT
GLOVE SS BIOGEL STRL SZ 7 (GLOVE) ×1 IMPLANT
GLOVE SUPERSENSE BIOGEL SZ 6.5 (GLOVE) ×2
GLOVE SUPERSENSE BIOGEL SZ 7 (GLOVE) ×4
GLOVE SURG 8.5 LATEX PF (GLOVE) ×3 IMPLANT
GOWN STRL REUS W/ TWL LRG LVL3 (GOWN DISPOSABLE) ×3 IMPLANT
GOWN STRL REUS W/TWL LRG LVL3 (GOWN DISPOSABLE) ×9
HANDLE PNEUMATIC FOR CONSTEL (OPHTHALMIC) ×2 IMPLANT
KIT BASIN OR (CUSTOM PROCEDURE TRAY) ×3 IMPLANT
KNIFE CRESCENT 2.5 55 ANG (BLADE) IMPLANT
MICROPICK 25G (MISCELLANEOUS) ×3
NDL 18GX1X1/2 (RX/OR ONLY) (NEEDLE) ×1 IMPLANT
NDL 25GX 5/8IN NON SAFETY (NEEDLE) IMPLANT
NDL FILTER BLUNT 18X1 1/2 (NEEDLE) ×1 IMPLANT
NDL HYPO 30X.5 LL (NEEDLE) IMPLANT
NEEDLE 18GX1X1/2 (RX/OR ONLY) (NEEDLE) ×3 IMPLANT
NEEDLE 25GX 5/8IN NON SAFETY (NEEDLE) IMPLANT
NEEDLE FILTER BLUNT 18X 1/2SAF (NEEDLE) ×2
NEEDLE FILTER BLUNT 18X1 1/2 (NEEDLE) ×1 IMPLANT
NEEDLE HYPO 30X.5 LL (NEEDLE) IMPLANT
NS IRRIG 1000ML POUR BTL (IV SOLUTION) ×3 IMPLANT
PACK VITRECTOMY CUSTOM (CUSTOM PROCEDURE TRAY) ×3 IMPLANT
PAD ARMBOARD 7.5X6 YLW CONV (MISCELLANEOUS) ×6 IMPLANT
PAK PIK VITRECTOMY CVS 25GA (OPHTHALMIC) ×3 IMPLANT
PENCIL BIPOLAR 25GA STR DISP (OPHTHALMIC RELATED) IMPLANT
PIC ILLUMINATED 25G (OPHTHALMIC) ×3
PICK MICROPICK 25G (MISCELLANEOUS) IMPLANT
PIK ILLUMINATED 25G (OPHTHALMIC) ×1 IMPLANT
PROBE LASER ILLUM FLEX CVD 25G (OPHTHALMIC) ×2 IMPLANT
REPL STRA BRUSH NDL (NEEDLE) IMPLANT
REPL STRA BRUSH NEEDLE (NEEDLE) IMPLANT
RESERVOIR BACK FLUSH (MISCELLANEOUS) IMPLANT
ROLLS DENTAL (MISCELLANEOUS) ×6 IMPLANT
SCISSORS TIP ADVANCED DSP 25GA (INSTRUMENTS) IMPLANT
SCRAPER DIAMOND 25GA (OPHTHALMIC RELATED) ×2 IMPLANT
SCRAPER DIAMOND DUST MEMBRANE (MISCELLANEOUS) IMPLANT
SPONGE SURGIFOAM ABS GEL 12-7 (HEMOSTASIS) ×3 IMPLANT
STOPCOCK 4 WAY LG BORE MALE ST (IV SETS) IMPLANT
SUT CHROMIC 7 0 TG140 8 (SUTURE) IMPLANT
SUT ETHILON 10 0 CS140 6 (SUTURE) IMPLANT
SUT ETHILON 9 0 TG140 8 (SUTURE) IMPLANT
SUT POLY NON ABSORB 10-0 8 STR (SUTURE) IMPLANT
SUT SILK 4 0 RB 1 (SUTURE) IMPLANT
SUT VICRYL 7 0 TG140 8 (SUTURE) ×2 IMPLANT
SYR 20CC LL (SYRINGE) ×3 IMPLANT
SYR 5ML LL (SYRINGE) IMPLANT
SYR BULB 3OZ (MISCELLANEOUS) ×3 IMPLANT
SYR BULB IRRIGATION 50ML (SYRINGE) ×2 IMPLANT
SYR TB 1ML LUER SLIP (SYRINGE) ×3 IMPLANT
SYRINGE 10CC LL (SYRINGE) IMPLANT
TOWEL OR 17X24 6PK STRL BLUE (TOWEL DISPOSABLE) ×3 IMPLANT
TUBING HIGH PRESS EXTEN 6IN (TUBING) IMPLANT
WATER STERILE IRR 1000ML POUR (IV SOLUTION) ×3 IMPLANT
WIPE INSTRUMENT VISIWIPE 73X73 (MISCELLANEOUS) IMPLANT

## 2015-08-10 NOTE — Progress Notes (Addendum)
Pt arrived to floor via cart at 3pm. Ambulated to BR, voided. Denies pain. VS per flowsheet. Oriented to room and call bell. Son with pt in room. Head of bed at 35 degrees per order - pt aware to keep it at 35.

## 2015-08-10 NOTE — Brief Op Note (Signed)
Brief Operative note   Preoperative diagnosis:  pre retinal fibrosis left eye.  Diabetic retinopathy left eye Postoperative diagnosis  * No Diagnosis Codes entered *  Procedures: pars plana vitrectomy, membrane peel, pan retinal laser, gas injection left eye  Surgeon:  Hayden Pedro, MD...  Assistant:  Deatra Ina SA    Anesthesia: General  Specimen: none  Estimated blood loss:  1cc  Complications: none  Patient sent to PACU in good condition  Composed by Hayden Pedro MD  Dictation number: [id number]

## 2015-08-10 NOTE — Op Note (Signed)
NAME:  Andrea Wilkinson, Andrea Wilkinson                 ACCOUNT NO.:  [id number]  MEDICAL RECORD NO.:  [id number]  LOCATION:  [id number]                        FACILITY:  [facility]  PHYSICIAN:  Chrystie Nose. Zigmund Daniel, M.D. DATE OF BIRTH:  May 27, 1932  DATE OF PROCEDURE:  08/10/2015 DATE OF DISCHARGE:                              OPERATIVE REPORT   ADMISSION DIAGNOSES:  Preretinal fibrosis and diabetic retinopathy in the left eye.  PROCEDURES:  Pars plana vitrectomy with membrane peel, retinal photocoagulation, gas-fluid exchange in the left eye.  SURGEON:  Chrystie Nose. Zigmund Daniel, M.D.  ASSISTANT:  Deatra Ina, SA.  ANESTHESIA:  General.  DETAILS:  Usual prep and drape, the indirect ophthalmoscope laser was moved into place.  560 burns were placed around the far periphery with the power of 400 mW, 1000 microns each and 0.1 seconds each.  Attention was carried to the pars plana area where the 25-gauge trocars were placed at 10, 2 and 4 o'clock.  Contact lens ring anchored into place at 6 and 12 o'clock.  Provisc placed on the corneal surface and the flat contact lens was placed.  Pars plana vitrectomy was begun just behind the lens.  Dense white vitreous membranes were encountered, these were carefully removed under low suction and rapid cutting down to the macular surface in a core fashion.  The vitrectomy was carried into the midperiphery with a 30-degree prismatic lens.  The vitrectomy was carried into the far periphery with a 30-degree prismatic lens as well. All vitreous was removed from the core, the mid and the far periphery down to the vitreous base.  The magnifying contact lens was placed onto a layer of methylcellulose down to the corneal surface.  The macular membrane was apparent.  The 25-gauge pick was used to engage the membrane and lifted free from its attachment to the retinal surface. The vitreous forceps were used to grasp the membrane and peel it across the macular region.  The membrane came up in  several pieces superior, temporal and inferiorly.  There were drusen and there was macular degeneration present in the macula.  Once the membrane was entirely removed from this macular surface, it was removed from the eye with the vitreous cutter.  The endolaser was positioned in the eye.  555 burns were placed around the retinal periphery in a panretinal manner.  The power was between 400 and 500 mW, 1000 microns each and 0.1 seconds each.  The 30% gas-fluid exchange was carried out.  The instruments were removed from the eye and the 25-gauge trocars were removed from the eye. The wounds were tested and found to be secure.  Polymyxin and ceftazidime were irrigated into tenon space.  Marcaine was injected around the globe for postop pain.  Decadron 10 mg was injected to the lower subconjunctival space.  Closing pressure was 10 with a Barraquer tonometer.  Polysporin ophthalmic ointment, a patch and shield were placed.  The patient was awakened and taken to the recovery in satisfactory condition.     Chrystie Nose. Zigmund Daniel, M.D.     JDM/MEDQ  D:  08/10/2015  T:  08/10/2015  Job:  [id number]

## 2015-08-10 NOTE — Transfer of Care (Signed)
Immediate Anesthesia Transfer of Care Note  Patient: Andrea Wilkinson  Procedure(s) Performed: Procedure(s): PARS PLANA VITRECTOMY WITH 25 GAUGE, headscope laser (Left) MEMBRANE PEEL, left eye (Left) AIR/FLUID EXCHANGE, left eye (Left) LASER PHOTO ABLATION, left eye (Left)  Patient Location: PACU  Anesthesia Type:General  Level of Consciousness: awake, alert , oriented and patient cooperative  Airway & Oxygen Therapy: Patient Spontanous Breathing and Patient connected to nasal cannula oxygen  Post-op Assessment: Report given to RN, Post -op Vital signs reviewed and stable and Patient moving all extremities  Post vital signs: Reviewed and stable  Last Vitals:  Filed Vitals:   08/10/15 1318 08/10/15 1330  BP: 169/82   Pulse: 96 82  Temp: 36.6 C   Resp: 10 14    Complications: No apparent anesthesia complications

## 2015-08-10 NOTE — Anesthesia Procedure Notes (Signed)
Procedure Name: Intubation Date/Time: 08/10/2015 11:53 AM Performed by: Willeen Cass P Pre-anesthesia Checklist: Patient identified, Timeout performed, Emergency Drugs available, Suction available and Patient being monitored Patient Re-evaluated:Patient Re-evaluated prior to inductionOxygen Delivery Method: Circle system utilized Preoxygenation: Pre-oxygenation with 100% oxygen Intubation Type: IV induction Ventilation: Mask ventilation without difficulty and Oral airway inserted - appropriate to patient size Laryngoscope Size: Mac and 3 Grade View: Grade I Tube type: Oral Tube size: 7.5 mm Number of attempts: 1 Airway Equipment and Method: Stylet and LTA kit utilized Placement Confirmation: ETT inserted through vocal cords under direct vision,  breath sounds checked- equal and bilateral and positive ETCO2 Secured at: 21 cm Tube secured with: Tape Dental Injury: Teeth and Oropharynx as per pre-operative assessment

## 2015-08-10 NOTE — Anesthesia Preprocedure Evaluation (Signed)
Anesthesia Evaluation  Patient identified by MRN, date of birth, ID band Patient awake    Reviewed: Allergy & Precautions, NPO status , Patient's Chart, lab work & pertinent test results  Airway Mallampati: II  TM Distance: >3 FB     Dental  (+) Edentulous Upper, Edentulous Lower   Pulmonary    breath sounds clear to auscultation       Cardiovascular hypertension,  Rhythm:Regular Rate:Normal     Neuro/Psych    GI/Hepatic   Endo/Other  diabetes  Renal/GU      Musculoskeletal   Abdominal   Peds  Hematology   Anesthesia Other Findings   Reproductive/Obstetrics                             Anesthesia Physical Anesthesia Plan  ASA: III  Anesthesia Plan: General   Post-op Pain Management:    Induction: Intravenous  Airway Management Planned: Oral ETT  Additional Equipment:   Intra-op Plan:   Post-operative Plan: Extubation in OR  Informed Consent: I have reviewed the patients History and Physical, chart, labs and discussed the procedure including the risks, benefits and alternatives for the proposed anesthesia with the patient or authorized representative who has indicated his/her understanding and acceptance.     Plan Discussed with: CRNA and Anesthesiologist  Anesthesia Plan Comments: (Diabetic Retinopathy L eye Type 2 DM glucose 117 Htn  Plan GA with oral ETT  Roberts Gaudy)        Anesthesia Quick Evaluation

## 2015-08-10 NOTE — H&P (Signed)
I examined the patient today and there is no change in the medical status 

## 2015-08-10 NOTE — Anesthesia Postprocedure Evaluation (Signed)
Anesthesia Post Note  Patient: Andrea Wilkinson  Procedure(s) Performed: Procedure(s) (LRB): PARS PLANA VITRECTOMY WITH 25 GAUGE, headscope laser (Left) MEMBRANE PEEL, left eye (Left) AIR/FLUID EXCHANGE, left eye (Left) LASER PHOTO ABLATION, left eye (Left)  Patient location during evaluation: PACU Anesthesia Type: General Level of consciousness: awake, awake and alert and oriented Pain management: pain level controlled Vital Signs Assessment: post-procedure vital signs reviewed and stable Respiratory status: spontaneous breathing, nonlabored ventilation and respiratory function stable Anesthetic complications: no    Last Vitals:  Filed Vitals:   08/10/15 1445 08/10/15 1500  BP:  166/63  Pulse: 75 80  Temp: 36.7 C 36.4 C  Resp: 14 16    Last Pain:  Filed Vitals:   08/10/15 1522  PainSc: 0-No pain                 Maciel Kegg COKER

## 2015-08-11 ENCOUNTER — Encounter (HOSPITAL_COMMUNITY): Payer: Self-pay | Admitting: Ophthalmology

## 2015-08-11 DIAGNOSIS — I10 Essential (primary) hypertension: Secondary | ICD-10-CM | POA: Diagnosis not present

## 2015-08-11 DIAGNOSIS — K219 Gastro-esophageal reflux disease without esophagitis: Secondary | ICD-10-CM | POA: Diagnosis not present

## 2015-08-11 DIAGNOSIS — H35372 Puckering of macula, left eye: Secondary | ICD-10-CM | POA: Diagnosis not present

## 2015-08-11 DIAGNOSIS — Z9842 Cataract extraction status, left eye: Secondary | ICD-10-CM | POA: Diagnosis not present

## 2015-08-11 DIAGNOSIS — Z9841 Cataract extraction status, right eye: Secondary | ICD-10-CM | POA: Diagnosis not present

## 2015-08-11 DIAGNOSIS — Z791 Long term (current) use of non-steroidal anti-inflammatories (NSAID): Secondary | ICD-10-CM | POA: Diagnosis not present

## 2015-08-11 DIAGNOSIS — H35362 Drusen (degenerative) of macula, left eye: Secondary | ICD-10-CM | POA: Diagnosis not present

## 2015-08-11 DIAGNOSIS — Z96653 Presence of artificial knee joint, bilateral: Secondary | ICD-10-CM | POA: Diagnosis not present

## 2015-08-11 DIAGNOSIS — E11319 Type 2 diabetes mellitus with unspecified diabetic retinopathy without macular edema: Secondary | ICD-10-CM | POA: Diagnosis not present

## 2015-08-11 LAB — GLUCOSE, CAPILLARY
GLUCOSE-CAPILLARY: 203 mg/dL — AB (ref 65–99)
Glucose-Capillary: 140 mg/dL — ABNORMAL HIGH (ref 65–99)

## 2015-08-11 MED ORDER — PREDNISOLONE ACETATE 1 % OP SUSP: 1.0000 [drp] | Freq: Four times a day (QID) | OPHTHALMIC | Status: DC

## 2015-08-11 MED ORDER — POLYVINYL ALCOHOL 1.4 % OP SOLN: 1.0000 [drp] | OPHTHALMIC | Status: DC | PRN

## 2015-08-11 MED ORDER — BACITRACIN-POLYMYXIN B 500-10000 UNIT/GM OP OINT: 1.0000 "application " | TOPICAL_OINTMENT | Freq: Three times a day (TID) | OPHTHALMIC | Status: DC

## 2015-08-11 MED ORDER — GATIFLOXACIN 0.5 % OP SOLN: 1.0000 [drp] | Freq: Four times a day (QID) | OPHTHALMIC | Status: DC

## 2015-08-11 NOTE — Progress Notes (Signed)
08/11/2015, 6:22 AM  Mental Status:  Awake, Alert, Oriented  Anterior segment: Cornea  Clear    Anterior Chamber Clear    Lens:   Clear, IOL, Cataract  Intra Ocular Pressure 15 mmHg with Tonopen  Vitreous: Clear 30%gas bubble   Retina:  Attached Good laser reaction   Impression: Excellent result Retina attached   Final Diagnosis: Principal Problem:   Preretinal fibrosis, left eye Active Problems:   Diabetic retinopathy (HCC)   Diabetic retinopathy of left eye associated with type 2 diabetes mellitus (HCC)   Epiretinal membrane, left eye   Plan: start post operative eye drops.  Discharge to home.  Give post operative instructions  Hayden Pedro 08/11/2015, 6:22 AM

## 2015-08-12 ENCOUNTER — Telehealth: Payer: Self-pay | Admitting: Internal Medicine

## 2015-08-12 NOTE — Telephone Encounter (Signed)
Patient is requesting [employer] referral to Dr. Charm Rings at [facility] on [address].  Patient can not get appointment until referral is sent over.  Patient is having issues with her hip and needs to follow up.

## 2015-08-12 NOTE — Telephone Encounter (Signed)
Referral done. Left msg to make pt aware.

## 2015-08-24 ENCOUNTER — Inpatient Hospital Stay (INDEPENDENT_AMBULATORY_CARE_PROVIDER_SITE_OTHER): Payer: Commercial Managed Care - HMO | Admitting: Ophthalmology

## 2015-08-24 DIAGNOSIS — H35372 Puckering of macula, left eye: Secondary | ICD-10-CM

## 2015-09-06 DIAGNOSIS — M5442 Lumbago with sciatica, left side: Secondary | ICD-10-CM | POA: Diagnosis not present

## 2015-09-14 ENCOUNTER — Encounter (INDEPENDENT_AMBULATORY_CARE_PROVIDER_SITE_OTHER): Payer: Commercial Managed Care - HMO | Admitting: Ophthalmology

## 2015-09-14 DIAGNOSIS — H35372 Puckering of macula, left eye: Secondary | ICD-10-CM

## 2015-10-11 DIAGNOSIS — M4316 Spondylolisthesis, lumbar region: Secondary | ICD-10-CM | POA: Diagnosis not present

## 2015-10-11 DIAGNOSIS — M160 Bilateral primary osteoarthritis of hip: Secondary | ICD-10-CM | POA: Diagnosis not present

## 2015-10-11 DIAGNOSIS — M5136 Other intervertebral disc degeneration, lumbar region: Secondary | ICD-10-CM | POA: Diagnosis not present

## 2015-10-11 DIAGNOSIS — M5416 Radiculopathy, lumbar region: Secondary | ICD-10-CM | POA: Diagnosis not present

## 2015-10-28 ENCOUNTER — Other Ambulatory Visit: Payer: Self-pay | Admitting: *Deleted

## 2015-10-28 DIAGNOSIS — M150 Primary generalized (osteo)arthritis: Secondary | ICD-10-CM

## 2015-10-28 DIAGNOSIS — M47816 Spondylosis without myelopathy or radiculopathy, lumbar region: Secondary | ICD-10-CM

## 2015-10-28 DIAGNOSIS — M159 Polyosteoarthritis, unspecified: Secondary | ICD-10-CM

## 2015-10-28 MED ORDER — FUROSEMIDE 20 MG PO TABS: 20.0000 mg | ORAL_TABLET | Freq: Every day | ORAL | Status: DC

## 2015-10-28 MED ORDER — MELOXICAM 15 MG PO TABS: 15.0000 mg | ORAL_TABLET | Freq: Every day | ORAL | Status: DC

## 2015-10-28 NOTE — Telephone Encounter (Signed)
Received call pt states she is needing refills sent to [facility] for her meloxicam & furosemide. Notified pt will send new script to mail service...Johny Chess

## 2015-11-03 ENCOUNTER — Ambulatory Visit (INDEPENDENT_AMBULATORY_CARE_PROVIDER_SITE_OTHER): Payer: Commercial Managed Care - HMO | Admitting: Internal Medicine

## 2015-11-03 ENCOUNTER — Encounter: Payer: Self-pay | Admitting: Internal Medicine

## 2015-11-03 ENCOUNTER — Other Ambulatory Visit (INDEPENDENT_AMBULATORY_CARE_PROVIDER_SITE_OTHER): Payer: Commercial Managed Care - HMO

## 2015-11-03 VITALS — BP 138/66 | HR 85 | Temp 98.7°F | Resp 16 | Ht 60.0 in | Wt 153.0 lb

## 2015-11-03 DIAGNOSIS — D539 Nutritional anemia, unspecified: Secondary | ICD-10-CM | POA: Diagnosis not present

## 2015-11-03 DIAGNOSIS — Z794 Long term (current) use of insulin: Secondary | ICD-10-CM | POA: Diagnosis not present

## 2015-11-03 DIAGNOSIS — M159 Polyosteoarthritis, unspecified: Secondary | ICD-10-CM

## 2015-11-03 DIAGNOSIS — D518 Other vitamin B12 deficiency anemias: Secondary | ICD-10-CM | POA: Insufficient documentation

## 2015-11-03 DIAGNOSIS — E11319 Type 2 diabetes mellitus with unspecified diabetic retinopathy without macular edema: Secondary | ICD-10-CM

## 2015-11-03 DIAGNOSIS — I10 Essential (primary) hypertension: Secondary | ICD-10-CM

## 2015-11-03 DIAGNOSIS — Z0000 Encounter for general adult medical examination without abnormal findings: Secondary | ICD-10-CM

## 2015-11-03 DIAGNOSIS — E785 Hyperlipidemia, unspecified: Secondary | ICD-10-CM

## 2015-11-03 DIAGNOSIS — E118 Type 2 diabetes mellitus with unspecified complications: Secondary | ICD-10-CM

## 2015-11-03 DIAGNOSIS — M150 Primary generalized (osteo)arthritis: Secondary | ICD-10-CM

## 2015-11-03 DIAGNOSIS — G63 Polyneuropathy in diseases classified elsewhere: Secondary | ICD-10-CM

## 2015-11-03 DIAGNOSIS — M47897 Other spondylosis, lumbosacral region: Secondary | ICD-10-CM

## 2015-11-03 DIAGNOSIS — E538 Deficiency of other specified B group vitamins: Secondary | ICD-10-CM

## 2015-11-03 DIAGNOSIS — M47816 Spondylosis without myelopathy or radiculopathy, lumbar region: Secondary | ICD-10-CM

## 2015-11-03 LAB — BASIC METABOLIC PANEL
BUN: 24 mg/dL — AB (ref 6–23)
CALCIUM: 10.1 mg/dL (ref 8.4–10.5)
CO2: 27 meq/L (ref 19–32)
CREATININE: 1.15 mg/dL (ref 0.40–1.20)
Chloride: 104 mEq/L (ref 96–112)
GFR: 48 mL/min — ABNORMAL LOW (ref >60.00)
GLUCOSE: 75 mg/dL (ref 70–99)
Potassium: 4.2 mEq/L (ref 3.5–5.1)
Sodium: 139 mEq/L (ref 135–145)

## 2015-11-03 LAB — CBC WITH DIFFERENTIAL/PLATELET
BASOS ABS: 0 10*3/uL (ref 0.0–0.1)
Basophils Relative: 0.3 % (ref 0.0–3.0)
EOS ABS: 0.2 10*3/uL (ref 0.0–0.7)
Eosinophils Relative: 2.4 % (ref 0.0–5.0)
HCT: 36 % (ref 36.0–46.0)
Hemoglobin: 12.1 g/dL (ref 12.0–15.0)
LYMPHS ABS: 2.6 10*3/uL (ref 0.7–4.0)
Lymphocytes Relative: 28.9 % (ref 12.0–46.0)
MCHC: 33.5 g/dL (ref 30.0–36.0)
MCV: 89 fl (ref 78.0–100.0)
MONO ABS: 0.6 10*3/uL (ref 0.1–1.0)
Monocytes Relative: 6.2 % (ref 3.0–12.0)
NEUTROS ABS: 5.7 10*3/uL (ref 1.4–7.7)
NEUTROS PCT: 62.2 % (ref 43.0–77.0)
PLATELETS: 297 10*3/uL (ref 150.0–400.0)
RBC: 4.05 Mil/uL (ref 3.87–5.11)
RDW: 13.6 % (ref 11.5–15.5)
WBC: 9.1 10*3/uL (ref 4.0–10.5)

## 2015-11-03 LAB — FERRITIN: FERRITIN: 19.2 ng/mL (ref 10.0–291.0)

## 2015-11-03 LAB — MICROALBUMIN / CREATININE URINE RATIO
CREATININE, U: 91.5 mg/dL
Microalb Creat Ratio: 1.2 mg/g (ref 0.0–30.0)
Microalb, Ur: 1.1 mg/dL (ref 0.0–1.9)

## 2015-11-03 LAB — TSH: TSH: 1.74 u[IU]/mL (ref 0.35–4.50)

## 2015-11-03 LAB — URINALYSIS, ROUTINE W REFLEX MICROSCOPIC
Bilirubin Urine: NEGATIVE
Hgb urine dipstick: NEGATIVE
KETONES UR: NEGATIVE
Nitrite: NEGATIVE
PH: 5 (ref 5.0–8.0)
SPECIFIC GRAVITY, URINE: 1.015 (ref 1.000–1.030)
Total Protein, Urine: NEGATIVE
URINE GLUCOSE: NEGATIVE
Urobilinogen, UA: 0.2 (ref 0.0–1.0)

## 2015-11-03 LAB — LIPID PANEL
CHOLESTEROL: 179 mg/dL (ref 0–200)
HDL: 45.2 mg/dL (ref >39.00)
LDL Cholesterol: 98 mg/dL (ref 0–99)
NONHDL: 134.08
Total CHOL/HDL Ratio: 4
Triglycerides: 178 mg/dL — ABNORMAL HIGH (ref 0.0–149.0)
VLDL: 35.6 mg/dL (ref 0.0–40.0)

## 2015-11-03 LAB — FOLATE: Folate: >23.3 ng/mL (ref >5.9)

## 2015-11-03 LAB — IBC PANEL
IRON: 81 ug/dL (ref 42–145)
SATURATION RATIOS: 21.8 % (ref 20.0–50.0)
TRANSFERRIN: 266 mg/dL (ref 212.0–360.0)

## 2015-11-03 LAB — VITAMIN B12: Vitamin B-12: 112 pg/mL — ABNORMAL LOW (ref 211–911)

## 2015-11-03 LAB — CK: Total CK: 60 U/L (ref 7–177)

## 2015-11-03 LAB — HEMOGLOBIN A1C: Hgb A1c MFr Bld: 7.6 % — ABNORMAL HIGH (ref 4.6–6.5)

## 2015-11-03 MED ORDER — TRAMADOL HCL 50 MG PO TABS: 50.0000 mg | ORAL_TABLET | Freq: Four times a day (QID) | ORAL | Status: DC | PRN

## 2015-11-03 NOTE — Patient Instructions (Signed)

## 2015-11-03 NOTE — Progress Notes (Signed)
Subjective:  Patient ID: Andrea Wilkinson, female    DOB: 1931/07/02  Age: 82 y.o. MRN: [id number]  CC: Hypertension; Hyperlipidemia; Diabetes; Osteoarthritis; and Back Pain   HPI Andrea Wilkinson presents for follow-up on the above medical problems. Since I last saw her she complains of worsening weakness and achiness. She complains of fatigue but denies chest pain or shortness of breath.  Outpatient Prescriptions Prior to Visit  Medication Sig Dispense Refill  . ACCU-CHEK AVIVA PLUS test strip USE TO CHECK BLOOD SUGAR TWICE DAILY 200 each 3  . beta carotene w/minerals (OCUVITE) tablet Take 1 tablet by mouth daily.    . Blood Glucose Monitoring Suppl (ACCU-CHEK AVIVA PLUS) W/DEVICE KIT Use to check blood sugars daily 1 kit 0  . enalapril (VASOTEC) 20 MG tablet Take 1 tablet (20 mg total) by mouth daily. 90 tablet 3  . furosemide (LASIX) 20 MG tablet Take 1 tablet (20 mg total) by mouth daily. 90 tablet 1  . glucose blood (ACCU-CHEK AVIVA PLUS) test strip Use to check blood sugar twice a day Dx 250.00 180 each 3  . glucose blood (ACCU-CHEK COMPACT TEST DRUM) test strip Use as instructed 100 each 12  . Hyprom-Naphaz-Polysorb-Zn Sulf (CLEAR EYES COMPLETE) SOLN Place 1 drop into both eyes as needed (for dry eyes).    . Lancets (ACCU-CHEK MULTICLIX) lancets Use to help check blood sugars twice a day Dx 250.00 180 each 3  . meloxicam (MOBIC) 15 MG tablet Take 1 tablet (15 mg total) by mouth daily. 90 tablet 1  . metFORMIN (GLUCOPHAGE) 1000 MG tablet Take 1 tablet (1,000 mg total) by mouth 2 (two) times daily with a meal. 180 tablet 3  . omeprazole (PRILOSEC) 20 MG capsule Take 1 capsule (20 mg total) by mouth daily. 90 capsule 3  . oxybutynin (DITROPAN-XL) 5 MG 24 hr tablet Take 1 tablet (5 mg total) by mouth daily. (Patient taking differently: Take 5 mg by mouth daily as needed (for frequency). ) 90 tablet 3  . tiZANidine (ZANAFLEX) 2 MG tablet TAKE 1 TABLET EVERY 6 HOURS AS NEEDED FOR MUSCLE  SPASMS (Patient taking differently: take 1 tablet by mouth every night at bedtime) 120 tablet 3  . bacitracin-polymyxin b (POLYSPORIN) ophthalmic ointment Place 1 application into the left eye 3 (three) times daily. apply to eye every 12 hours while awake 3.5 g 0  . gatifloxacin (ZYMAXID) 0.5 % SOLN Place 1 drop into the left eye 4 (four) times daily.    . polyvinyl alcohol (LIQUIFILM TEARS) 1.4 % ophthalmic solution Place 1 drop into both eyes as needed for dry eyes. 15 mL 0  . prednisoLONE acetate (PRED FORTE) 1 % ophthalmic suspension Place 1 drop into the left eye 4 (four) times daily. 5 mL 0  . traMADol (ULTRAM) 50 MG tablet Take 1 tablet (50 mg total) by mouth every 6 (six) hours as needed. 360 tablet 1  . fentaNYL (DURAGESIC - DOSED MCG/HR) 12 MCG/HR Place 1 patch (12.5 mcg total) onto the skin every 3 (three) days. (Patient not taking: Reported on 11/03/2015) 10 patch 0   No facility-administered medications prior to visit.    ROS Review of Systems  Constitutional: Positive for fatigue. Negative for fever, chills, diaphoresis and appetite change.  HENT: Negative.   Eyes: Negative.   Respiratory: Negative.  Negative for cough, choking, chest tightness, shortness of breath and stridor.   Cardiovascular: Negative.  Negative for chest pain, palpitations and leg swelling.  Gastrointestinal: Negative.  Negative  for nausea, vomiting, abdominal pain, diarrhea and constipation.  Endocrine: Negative.   Genitourinary: Negative.   Musculoskeletal: Positive for myalgias, back pain and arthralgias. Negative for neck pain.  Skin: Negative.  Negative for color change, pallor and rash.  Allergic/Immunologic: Negative.   Neurological: Positive for weakness and numbness. Negative for dizziness, light-headedness and headaches.       She complains of numbness in her fingertips  Hematological: Negative.  Negative for adenopathy. Does not bruise/bleed easily.  Psychiatric/Behavioral: Negative.      Objective:  BP 138/66 mmHg  Pulse 85  Temp(Src) 98.7 F (37.1 C) (Oral)  Resp 16  Ht 5' (1.524 m)  Wt 153 lb (69.4 kg)  BMI 29.88 kg/m2  SpO2 97%  BP Readings from Last 3 Encounters:  11/03/15 138/66  08/11/15 154/68  06/15/15 148/92    Wt Readings from Last 3 Encounters:  11/03/15 153 lb (69.4 kg)  08/10/15 148 lb 9.4 oz (67.4 kg)  06/15/15 153 lb (69.4 kg)    Physical Exam  Constitutional: She is oriented to person, place, and time. No distress.  HENT:  Mouth/Throat: Oropharynx is clear and moist. No oropharyngeal exudate.  Eyes: Conjunctivae are normal. Right eye exhibits no discharge. Left eye exhibits no discharge. No scleral icterus.  Neck: Normal range of motion. Neck supple. No JVD present. No tracheal deviation present. No thyromegaly present.  Cardiovascular: Normal rate, regular rhythm, normal heart sounds and intact distal pulses.  Exam reveals no gallop and no friction rub.   No murmur heard. Pulmonary/Chest: Breath sounds normal. No stridor. No respiratory distress. She has no wheezes. She has no rales. She exhibits no tenderness.  Abdominal: Soft. Bowel sounds are normal. She exhibits no distension and no mass. There is no tenderness. There is no rebound and no guarding.  Musculoskeletal: Normal range of motion. She exhibits no edema or tenderness.  Lymphadenopathy:    She has no cervical adenopathy.  Neurological: She is oriented to person, place, and time.  Skin: Skin is warm and dry. No rash noted. She is not diaphoretic. No erythema. No pallor.  Vitals reviewed.   Lab Results  Component Value Date   WBC 9.1 11/03/2015   HGB 12.1 11/03/2015   HCT 36.0 11/03/2015   PLT 297.0 11/03/2015   GLUCOSE 75 11/03/2015   CHOL 179 11/03/2015   TRIG 178.0* 11/03/2015   HDL 45.20 11/03/2015   LDLDIRECT 90.0 05/05/2015   LDLCALC 98 11/03/2015   ALT 14 04/07/2014   AST 18 04/07/2014   NA 139 11/03/2015   K 4.2 11/03/2015   CL 104 11/03/2015    CREATININE 1.15 11/03/2015   BUN 24* 11/03/2015   CO2 27 11/03/2015   TSH 1.74 11/03/2015   INR 1.00 10/31/2010   HGBA1C 7.6* 11/03/2015   MICROALBUR 1.1 11/03/2015    No results found.  Assessment & Plan:   [name] was seen today for hypertension, hyperlipidemia, diabetes, osteoarthritis and back pain.  Diagnoses and all orders for this visit:  Essential hypertension- her blood pressure is well-controlled, electrolytes and renal function are stable. -     Basic metabolic panel; Future -     CBC with Differential/Platelet; Future -     Urinalysis, Routine w reflex microscopic (not at [facility]); Future  Type 2 diabetes mellitus with complication, with long-term current use of insulin ([place])- her A1c is 7.6%, this is adequate blood pressure control considering her age and comorbidities. -     Basic metabolic panel; Future -  Hemoglobin A1c; Future -     Microalbumin / creatinine urine ratio; Future  Diabetic retinopathy of left eye associated with type 2 diabetes mellitus ([place])  Hyperlipidemia with target LDL less than 100- she has achieved her LDL goal and is not willing to take a statin -     Lipid panel; Future -     TSH; Future -     CK; Future  Deficiency anemia- her hemoglobin and hematocrit in the low normal range, her iron level is normal but she is B12 deficient -     IBC panel; Future -     Vitamin B12; Future -     Folate; Future -     Ferritin; Future  Primary osteoarthritis involving multiple joints -     traMADol (ULTRAM) 50 MG tablet; Take 1 tablet (50 mg total) by mouth every 6 (six) hours as needed.  Spondylosis of lumbar region without myelopathy or radiculopathy -     traMADol (ULTRAM) 50 MG tablet; Take 1 tablet (50 mg total) by mouth every 6 (six) hours as needed.  Other osteoarthritis of spine, lumbosacral region -     traMADol (ULTRAM) 50 MG tablet; Take 1 tablet (50 mg total) by mouth every 6 (six) hours as needed.  Vitamin B12 deficiency neuropathy-  we'll start B12 replacement therapy, she will get an injection weekly for 3 weeks and then monthly thereafter.  Routine general medical examination at a health care facility  I have discontinued Ms. Larivee's fentaNYL, bacitracin-polymyxin b, gatifloxacin, prednisoLONE acetate, and polyvinyl alcohol. I am also having her maintain her glucose blood, ACCU-CHEK AVIVA PLUS, glucose blood, accu-chek multiclix, enalapril, omeprazole, ACCU-CHEK AVIVA PLUS, tiZANidine, oxybutynin, beta carotene w/minerals, CLEAR EYES COMPLETE, metFORMIN, meloxicam, furosemide, and traMADol.  Meds ordered this encounter  Medications  . traMADol (ULTRAM) 50 MG tablet    Sig: Take 1 tablet (50 mg total) by mouth every 6 (six) hours as needed.    Dispense:  360 tablet    Refill:  1   See AVS for instructions about healthy living and anticipatory guidance.  Follow-up: Return in about 4 months (around 03/05/2016).  Scarlette Calico, MD

## 2015-11-03 NOTE — Progress Notes (Signed)
Pre visit review using our clinic review tool, if applicable. No additional management support is needed unless otherwise documented below in the visit note. 

## 2015-11-04 NOTE — Assessment & Plan Note (Signed)

## 2015-11-05 ENCOUNTER — Ambulatory Visit (INDEPENDENT_AMBULATORY_CARE_PROVIDER_SITE_OTHER): Payer: Commercial Managed Care - HMO

## 2015-11-05 DIAGNOSIS — E538 Deficiency of other specified B group vitamins: Secondary | ICD-10-CM

## 2015-11-05 MED ORDER — CYANOCOBALAMIN 1000 MCG/ML IJ SOLN
1000.0000 ug | Freq: Once | INTRAMUSCULAR | Status: AC
  Administered 2015-11-05: 1000 ug via INTRAMUSCULAR

## 2015-11-11 ENCOUNTER — Ambulatory Visit (INDEPENDENT_AMBULATORY_CARE_PROVIDER_SITE_OTHER): Payer: Commercial Managed Care - HMO

## 2015-11-11 DIAGNOSIS — E538 Deficiency of other specified B group vitamins: Secondary | ICD-10-CM

## 2015-11-11 MED ORDER — CYANOCOBALAMIN 1000 MCG/ML IJ SOLN
1000.0000 ug | Freq: Once | INTRAMUSCULAR | Status: AC
  Administered 2015-11-11: 1000 ug via INTRAMUSCULAR

## 2015-11-17 DIAGNOSIS — M5136 Other intervertebral disc degeneration, lumbar region: Secondary | ICD-10-CM | POA: Diagnosis not present

## 2015-11-18 ENCOUNTER — Ambulatory Visit (INDEPENDENT_AMBULATORY_CARE_PROVIDER_SITE_OTHER): Payer: Commercial Managed Care - HMO

## 2015-11-18 DIAGNOSIS — E538 Deficiency of other specified B group vitamins: Secondary | ICD-10-CM

## 2015-11-18 MED ORDER — CYANOCOBALAMIN 1000 MCG/ML IJ SOLN
1000.0000 ug | Freq: Once | INTRAMUSCULAR | Status: AC
  Administered 2015-11-18: 1000 ug via INTRAMUSCULAR

## 2015-11-23 ENCOUNTER — Encounter (INDEPENDENT_AMBULATORY_CARE_PROVIDER_SITE_OTHER): Payer: Commercial Managed Care - HMO | Admitting: Ophthalmology

## 2015-11-23 DIAGNOSIS — D3132 Benign neoplasm of left choroid: Secondary | ICD-10-CM

## 2015-11-23 DIAGNOSIS — H353123 Nonexudative age-related macular degeneration, left eye, advanced atrophic without subfoveal involvement: Secondary | ICD-10-CM

## 2015-11-23 DIAGNOSIS — H35372 Puckering of macula, left eye: Secondary | ICD-10-CM

## 2015-11-23 DIAGNOSIS — E113592 Type 2 diabetes mellitus with proliferative diabetic retinopathy without macular edema, left eye: Secondary | ICD-10-CM | POA: Diagnosis not present

## 2015-11-23 DIAGNOSIS — E11319 Type 2 diabetes mellitus with unspecified diabetic retinopathy without macular edema: Secondary | ICD-10-CM | POA: Diagnosis not present

## 2015-11-23 DIAGNOSIS — E113391 Type 2 diabetes mellitus with moderate nonproliferative diabetic retinopathy without macular edema, right eye: Secondary | ICD-10-CM | POA: Diagnosis not present

## 2015-11-23 DIAGNOSIS — I10 Essential (primary) hypertension: Secondary | ICD-10-CM

## 2015-11-23 DIAGNOSIS — H35033 Hypertensive retinopathy, bilateral: Secondary | ICD-10-CM | POA: Diagnosis not present

## 2015-11-30 ENCOUNTER — Other Ambulatory Visit: Payer: Self-pay | Admitting: *Deleted

## 2015-11-30 MED ORDER — INDOMETHACIN 25 MG PO CAPS: 25.0000 mg | ORAL_CAPSULE | Freq: Four times a day (QID) | ORAL | Status: DC | PRN

## 2015-11-30 NOTE — Telephone Encounter (Signed)
Received call pt requesting refill on Indomethacin. Verified pharmacy inform will send to [facility]...Johny Chess

## 2015-12-17 ENCOUNTER — Other Ambulatory Visit: Payer: Self-pay | Admitting: Internal Medicine

## 2015-12-22 ENCOUNTER — Ambulatory Visit (INDEPENDENT_AMBULATORY_CARE_PROVIDER_SITE_OTHER): Payer: Commercial Managed Care - HMO

## 2015-12-22 DIAGNOSIS — E538 Deficiency of other specified B group vitamins: Secondary | ICD-10-CM

## 2015-12-22 MED ORDER — CYANOCOBALAMIN 1000 MCG/ML IJ SOLN
1000.0000 ug | Freq: Once | INTRAMUSCULAR | Status: AC
  Administered 2015-12-22: 1000 ug via INTRAMUSCULAR

## 2016-01-18 ENCOUNTER — Encounter: Payer: Self-pay | Admitting: Internal Medicine

## 2016-01-18 ENCOUNTER — Ambulatory Visit (INDEPENDENT_AMBULATORY_CARE_PROVIDER_SITE_OTHER): Payer: Commercial Managed Care - HMO | Admitting: Internal Medicine

## 2016-01-18 VITALS — BP 138/80 | HR 90 | Temp 97.8°F | Resp 16 | Wt 153.0 lb

## 2016-01-18 DIAGNOSIS — K219 Gastro-esophageal reflux disease without esophagitis: Secondary | ICD-10-CM

## 2016-01-18 DIAGNOSIS — M7989 Other specified soft tissue disorders: Secondary | ICD-10-CM | POA: Insufficient documentation

## 2016-01-18 DIAGNOSIS — M799 Soft tissue disorder, unspecified: Secondary | ICD-10-CM | POA: Diagnosis not present

## 2016-01-18 DIAGNOSIS — I10 Essential (primary) hypertension: Secondary | ICD-10-CM

## 2016-01-18 MED ORDER — OMEPRAZOLE 20 MG PO CPDR: 20.0000 mg | DELAYED_RELEASE_CAPSULE | Freq: Every day | ORAL | 3 refills | Status: DC

## 2016-01-18 NOTE — Patient Instructions (Signed)

## 2016-01-18 NOTE — Progress Notes (Signed)
Subjective:  Patient ID: Andrea Wilkinson, female    DOB: [birth year]  Age: 82 y.o. MRN: [id number]  CC: Gastroesophageal Reflux   HPI [name] presents for follow-up regarding acid reflux disease as well as concerns about a hard area on her left buttocks.  Initially she complained of heartburn and requested a prescription for Zantac but then she tells me she is already taking a proton pump inhibitor and Zantac and getting adequately from her heartburn. She denies any recent episodes of odynophagia, dysphagia, choking, or chest pain.  She has a hard area over her left lower buttocks that she is concerned about. She describes seeing a surgeon years ago and having a biopsy done of this area. She now thinks there may be some cancer growing there.  Outpatient Medications Prior to Visit  Medication Sig Dispense Refill  . ACCU-CHEK AVIVA PLUS test strip USE TO CHECK BLOOD SUGAR TWICE DAILY 200 each 3  . beta carotene w/minerals (OCUVITE) tablet Take 1 tablet by mouth daily.    . Blood Glucose Monitoring Suppl (ACCU-CHEK AVIVA PLUS) W/DEVICE KIT Use to check blood sugars daily 1 kit 0  . enalapril (VASOTEC) 20 MG tablet Take 1 tablet (20 mg total) by mouth daily. 90 tablet 3  . furosemide (LASIX) 20 MG tablet Take 1 tablet (20 mg total) by mouth daily. 90 tablet 1  . glucose blood (ACCU-CHEK AVIVA PLUS) test strip Use to check blood sugar twice a day Dx 250.00 180 each 3  . glucose blood (ACCU-CHEK COMPACT TEST DRUM) test strip Use as instructed 100 each 12  . Lancets (ACCU-CHEK MULTICLIX) lancets Use to help check blood sugars twice a day Dx 250.00 180 each 3  . meloxicam (MOBIC) 15 MG tablet Take 1 tablet (15 mg total) by mouth daily. 90 tablet 1  . metFORMIN (GLUCOPHAGE) 1000 MG tablet Take 1 tablet (1,000 mg total) by mouth 2 (two) times daily with a meal. 180 tablet 3  . oxybutynin (DITROPAN-XL) 5 MG 24 hr tablet Take 1 tablet (5 mg total) by mouth daily. (Patient taking differently: Take  5 mg by mouth daily as needed (for frequency). ) 90 tablet 3  . tiZANidine (ZANAFLEX) 2 MG tablet TAKE 1 TABLET EVERY 6 HOURS AS NEEDED FOR MUSCLE SPASMS (Patient taking differently: take 1 tablet by mouth every night at bedtime) 120 tablet 3  . traMADol (ULTRAM) 50 MG tablet Take 1 tablet (50 mg total) by mouth every 6 (six) hours as needed. 360 tablet 1  . Hyprom-Naphaz-Polysorb-Zn Sulf (CLEAR EYES COMPLETE) SOLN Place 1 drop into both eyes as needed (for dry eyes).    . indomethacin (INDOCIN) 25 MG capsule Take 1 capsule (25 mg total) by mouth every 6 (six) hours as needed for mild pain or moderate pain. arthritis 30 capsule 0  . omeprazole (PRILOSEC) 20 MG capsule TAKE 1 CAPSULE EVERY DAY 90 capsule 3   No facility-administered medications prior to visit.     ROS Review of Systems  Constitutional: Negative.  Negative for appetite change, fatigue and unexpected weight change.  HENT: Negative.  Negative for trouble swallowing and voice change.   Eyes: Negative.  Negative for visual disturbance.  Respiratory: Negative.  Negative for cough, choking, chest tightness, shortness of breath and stridor.   Cardiovascular: Negative.  Negative for chest pain, palpitations and leg swelling.  Gastrointestinal: Negative.  Negative for abdominal pain, blood in stool, constipation, diarrhea, nausea and vomiting.  Endocrine: Negative.   Genitourinary: Negative.  Negative  for dysuria.  Musculoskeletal: Negative.  Negative for arthralgias, back pain, joint swelling and myalgias.  Skin: Negative.  Negative for color change and rash.  Allergic/Immunologic: Negative.   Neurological: Negative.  Negative for dizziness, tremors, weakness, light-headedness and headaches.  Hematological: Negative.  Negative for adenopathy. Does not bruise/bleed easily.  Psychiatric/Behavioral: Negative.     Objective:  BP 138/80   Pulse 90   Temp 97.8 F (36.6 C)   Resp 16   Wt 153 lb (69.4 kg)   SpO2 98%   BMI 29.88  kg/m   BP Readings from Last 3 Encounters:  01/18/16 138/80  11/03/15 138/66  08/11/15 (!) 154/68    Wt Readings from Last 3 Encounters:  01/18/16 153 lb (69.4 kg)  11/03/15 153 lb (69.4 kg)  08/10/15 148 lb 9.4 oz (67.4 kg)    Physical Exam  Constitutional: She is oriented to person, place, and time. No distress.  HENT:  Mouth/Throat: Oropharynx is clear and moist. No oropharyngeal exudate.  Eyes: Conjunctivae are normal. Right eye exhibits no discharge. Left eye exhibits no discharge. No scleral icterus.  Neck: Normal range of motion. Neck supple. No JVD present. No tracheal deviation present. No thyromegaly present.  Cardiovascular: Normal rate, regular rhythm, normal heart sounds and intact distal pulses.  Exam reveals no gallop and no friction rub.   No murmur heard. Pulmonary/Chest: Effort normal and breath sounds normal. No stridor. No respiratory distress. She has no wheezes. She has no rales. She exhibits no tenderness.  Abdominal: Soft. Bowel sounds are normal. She exhibits no distension and no mass. There is no tenderness. There is no rebound and no guarding.  Genitourinary:     Musculoskeletal: Normal range of motion. She exhibits no edema, tenderness or deformity.  Lymphadenopathy:    She has no cervical adenopathy.  Neurological: She is oriented to person, place, and time.  Skin: Skin is warm and dry. No rash noted. She is not diaphoretic. No erythema. No pallor.  Vitals reviewed.   Lab Results  Component Value Date   WBC 9.1 11/03/2015   HGB 12.1 11/03/2015   HCT 36.0 11/03/2015   PLT 297.0 11/03/2015   GLUCOSE 75 11/03/2015   CHOL 179 11/03/2015   TRIG 178.0 (H) 11/03/2015   HDL 45.20 11/03/2015   LDLDIRECT 90.0 05/05/2015   LDLCALC 98 11/03/2015   ALT 14 04/07/2014   AST 18 04/07/2014   NA 139 11/03/2015   K 4.2 11/03/2015   CL 104 11/03/2015   CREATININE 1.15 11/03/2015   BUN 24 (H) 11/03/2015   CO2 27 11/03/2015   TSH 1.74 11/03/2015   INR  1.00 10/31/2010   HGBA1C 7.6 (H) 11/03/2015   MICROALBUR 1.1 11/03/2015    No results found.  Assessment & Plan:   Jenina was seen today for gastroesophageal reflux.  Diagnoses and all orders for this visit:  Essential hypertension- her blood pressure is adequately well-controlled  Gastroesophageal reflux disease without esophagitis- she will continue to combination of a proton pump inhibitor and H2 blocker. -     omeprazole (PRILOSEC) 20 MG capsule; Take 1 capsule (20 mg total) by mouth daily.  Mass of soft tissue- I think that this is benign subcutaneous thickening but at her request will send her to Gen. surgery for further evaluation. -     Ambulatory referral to General Surgery   I have discontinued Ms. Myre's CLEAR EYES COMPLETE and indomethacin. I have also changed her omeprazole. Additionally, I am having her maintain her glucose  blood, ACCU-CHEK AVIVA PLUS, glucose blood, accu-chek multiclix, enalapril, ACCU-CHEK AVIVA PLUS, tiZANidine, oxybutynin, beta carotene w/minerals, metFORMIN, meloxicam, furosemide, and traMADol.  Meds ordered this encounter  Medications  . omeprazole (PRILOSEC) 20 MG capsule    Sig: Take 1 capsule (20 mg total) by mouth daily.    Dispense:  90 capsule    Refill:  3     Follow-up: Return in about 3 months (around 04/19/2016).  Scarlette Calico, MD

## 2016-01-18 NOTE — Progress Notes (Signed)
Pre visit review using our clinic review tool, if applicable. No additional management support is needed unless otherwise documented below in the visit note. 

## 2016-01-19 DIAGNOSIS — M5416 Radiculopathy, lumbar region: Secondary | ICD-10-CM | POA: Diagnosis not present

## 2016-02-04 ENCOUNTER — Other Ambulatory Visit: Payer: Self-pay | Admitting: General Surgery

## 2016-02-04 DIAGNOSIS — R2242 Localized swelling, mass and lump, left lower limb: Secondary | ICD-10-CM | POA: Diagnosis not present

## 2016-02-09 ENCOUNTER — Other Ambulatory Visit: Payer: Self-pay | Admitting: General Surgery

## 2016-02-09 DIAGNOSIS — R2242 Localized swelling, mass and lump, left lower limb: Secondary | ICD-10-CM

## 2016-02-13 ENCOUNTER — Ambulatory Visit
Admission: RE | Admit: 2016-02-13 | Discharge: 2016-02-13 | Disposition: A | Discharge status: Home / Self Care | Payer: Commercial Managed Care - HMO | Source: Ambulatory Visit | Attending: General Surgery | Admitting: General Surgery

## 2016-02-13 DIAGNOSIS — S76312A Strain of muscle, fascia and tendon of the posterior muscle group at thigh level, left thigh, initial encounter: Secondary | ICD-10-CM | POA: Diagnosis not present

## 2016-02-13 DIAGNOSIS — R2242 Localized swelling, mass and lump, left lower limb: Secondary | ICD-10-CM

## 2016-02-13 MED ORDER — GADOBENATE DIMEGLUMINE 529 MG/ML IV SOLN
7.0000 mL | Freq: Once | INTRAVENOUS | Status: AC | PRN
  Administered 2016-02-13: 7 mL via INTRAVENOUS

## 2016-02-18 DIAGNOSIS — M25552 Pain in left hip: Secondary | ICD-10-CM | POA: Diagnosis not present

## 2016-03-01 ENCOUNTER — Ambulatory Visit (INDEPENDENT_AMBULATORY_CARE_PROVIDER_SITE_OTHER): Payer: Commercial Managed Care - HMO | Admitting: Internal Medicine

## 2016-03-01 ENCOUNTER — Other Ambulatory Visit (INDEPENDENT_AMBULATORY_CARE_PROVIDER_SITE_OTHER): Payer: Commercial Managed Care - HMO

## 2016-03-01 ENCOUNTER — Encounter: Payer: Self-pay | Admitting: Internal Medicine

## 2016-03-01 VITALS — BP 138/78 | HR 90 | Temp 98.1°F | Resp 16 | Ht 60.0 in | Wt 154.2 lb

## 2016-03-01 DIAGNOSIS — I10 Essential (primary) hypertension: Secondary | ICD-10-CM

## 2016-03-01 DIAGNOSIS — K2100 Gastro-esophageal reflux disease with esophagitis, without bleeding: Secondary | ICD-10-CM

## 2016-03-01 DIAGNOSIS — R1310 Dysphagia, unspecified: Secondary | ICD-10-CM

## 2016-03-01 DIAGNOSIS — E118 Type 2 diabetes mellitus with unspecified complications: Secondary | ICD-10-CM | POA: Diagnosis not present

## 2016-03-01 DIAGNOSIS — E08311 Diabetes mellitus due to underlying condition with unspecified diabetic retinopathy with macular edema: Secondary | ICD-10-CM

## 2016-03-01 LAB — HEMOGLOBIN A1C: Hgb A1c MFr Bld: 7.6 % — ABNORMAL HIGH (ref 4.6–6.5)

## 2016-03-01 LAB — BASIC METABOLIC PANEL
BUN: 16 mg/dL (ref 6–23)
CALCIUM: 9.6 mg/dL (ref 8.4–10.5)
CO2: 30 mEq/L (ref 19–32)
Chloride: 99 mEq/L (ref 96–112)
Creatinine, Ser: 1.22 mg/dL — ABNORMAL HIGH (ref 0.40–1.20)
GFR: 44.8 mL/min — AB (ref >60.00)
GLUCOSE: 128 mg/dL — AB (ref 70–99)
POTASSIUM: 4.8 meq/L (ref 3.5–5.1)
Sodium: 138 mEq/L (ref 135–145)

## 2016-03-01 NOTE — Progress Notes (Signed)
Pre visit review using our clinic review tool, if applicable. No additional management support is needed unless otherwise documented below in the visit note. 

## 2016-03-01 NOTE — Progress Notes (Signed)
Subjective:  Patient ID: Andrea Wilkinson, female    DOB: 1931-06-20  Age: 82 y.o. MRN: [id number]  CC: Hypertension; Gastroesophageal Reflux; and Diabetes   HPI Andrea Wilkinson presents for follow-up on multiple medical problems. She complains of a 5 month history of worsening odynophagia and dysphagia mostly with foods like meats. She has had a couple of episodes of choking and bringing up food. She has not noticed any hematemesis, chest pain, abdominal pain, diarrhea, constipation, or melena. She is using Tums and omeprazole to control the heartburn. She has had prior upper endoscopies and it sounds like she's had dilations for strictures.  Outpatient Medications Prior to Visit  Medication Sig Dispense Refill  . ACCU-CHEK AVIVA PLUS test strip USE TO CHECK BLOOD SUGAR TWICE DAILY 200 each 3  . beta carotene w/minerals (OCUVITE) tablet Take 1 tablet by mouth daily.    . Blood Glucose Monitoring Suppl (ACCU-CHEK AVIVA PLUS) W/DEVICE KIT Use to check blood sugars daily 1 kit 0  . enalapril (VASOTEC) 20 MG tablet Take 1 tablet (20 mg total) by mouth daily. 90 tablet 3  . furosemide (LASIX) 20 MG tablet Take 1 tablet (20 mg total) by mouth daily. 90 tablet 1  . glucose blood (ACCU-CHEK AVIVA PLUS) test strip Use to check blood sugar twice a day Dx 250.00 180 each 3  . glucose blood (ACCU-CHEK COMPACT TEST DRUM) test strip Use as instructed 100 each 12  . Lancets (ACCU-CHEK MULTICLIX) lancets Use to help check blood sugars twice a day Dx 250.00 180 each 3  . metFORMIN (GLUCOPHAGE) 1000 MG tablet Take 1 tablet (1,000 mg total) by mouth 2 (two) times daily with a meal. 180 tablet 3  . omeprazole (PRILOSEC) 20 MG capsule Take 1 capsule (20 mg total) by mouth daily. 90 capsule 3  . oxybutynin (DITROPAN-XL) 5 MG 24 hr tablet Take 1 tablet (5 mg total) by mouth daily. (Patient taking differently: Take 5 mg by mouth daily as needed (for frequency). ) 90 tablet 3  . tiZANidine (ZANAFLEX) 2 MG tablet TAKE 1  TABLET EVERY 6 HOURS AS NEEDED FOR MUSCLE SPASMS (Patient taking differently: take 1 tablet by mouth every night at bedtime) 120 tablet 3  . traMADol (ULTRAM) 50 MG tablet Take 1 tablet (50 mg total) by mouth every 6 (six) hours as needed. 360 tablet 1  . meloxicam (MOBIC) 15 MG tablet Take 1 tablet (15 mg total) by mouth daily. 90 tablet 1   No facility-administered medications prior to visit.     ROS Review of Systems  Constitutional: Negative.  Negative for activity change, appetite change, chills, fatigue, fever and unexpected weight change.  HENT: Positive for trouble swallowing. Negative for sinus pressure and voice change.   Eyes: Negative.  Negative for visual disturbance.  Respiratory: Negative.  Negative for cough, choking, chest tightness, shortness of breath and stridor.   Cardiovascular: Negative.  Negative for chest pain, palpitations and leg swelling.  Gastrointestinal: Negative.  Negative for abdominal pain, blood in stool, constipation, diarrhea, nausea and vomiting.  Endocrine: Negative for polydipsia, polyphagia and polyuria.  Genitourinary: Negative.  Negative for difficulty urinating.  Musculoskeletal: Negative.  Negative for arthralgias, back pain, myalgias and neck pain.  Skin: Negative.  Negative for color change, pallor and rash.  Allergic/Immunologic: Negative.   Neurological: Negative.  Negative for dizziness, weakness and numbness.  Hematological: Negative for adenopathy. Does not bruise/bleed easily.  Psychiatric/Behavioral: Negative.     Objective:  BP 138/78 (BP Location: Left  Arm, Patient Position: Standing, Cuff Size: Normal)   Pulse 90   Temp 98.1 F (36.7 C) (Oral)   Resp 16   Ht 5' (1.524 m)   Wt 154 lb 4 oz (70 kg)   SpO2 97%   BMI 30.12 kg/m   BP Readings from Last 3 Encounters:  03/01/16 138/78  01/18/16 138/80  11/03/15 138/66    Wt Readings from Last 3 Encounters:  03/01/16 154 lb 4 oz (70 kg)  01/18/16 153 lb (69.4 kg)  11/03/15  153 lb (69.4 kg)    Physical Exam  Constitutional: She is oriented to person, place, and time. No distress.  HENT:  Mouth/Throat: Oropharynx is clear and moist. No oropharyngeal exudate.  Eyes: Conjunctivae are normal. Right eye exhibits no discharge. Left eye exhibits no discharge. No scleral icterus.  Neck: Normal range of motion. Neck supple. No JVD present. No tracheal deviation present. No thyromegaly present.  Cardiovascular: Normal rate, regular rhythm, normal heart sounds and intact distal pulses.  Exam reveals no gallop and no friction rub.   No murmur heard. Pulmonary/Chest: Effort normal and breath sounds normal. No stridor. No respiratory distress. She has no wheezes. She has no rales. She exhibits no tenderness.  Abdominal: Soft. Bowel sounds are normal. She exhibits no distension and no mass. There is no tenderness. There is no rebound and no guarding.  Musculoskeletal: Normal range of motion. She exhibits no edema, tenderness or deformity.  Lymphadenopathy:    She has no cervical adenopathy.  Neurological: She is oriented to person, place, and time.  Skin: Skin is warm and dry. No rash noted. She is not diaphoretic. No erythema. No pallor.  Vitals reviewed.   Lab Results  Component Value Date   WBC 9.1 11/03/2015   HGB 12.1 11/03/2015   HCT 36.0 11/03/2015   PLT 297.0 11/03/2015   GLUCOSE 128 (H) 03/01/2016   CHOL 179 11/03/2015   TRIG 178.0 (H) 11/03/2015   HDL 45.20 11/03/2015   LDLDIRECT 90.0 05/05/2015   LDLCALC 98 11/03/2015   ALT 14 04/07/2014   AST 18 04/07/2014   NA 138 03/01/2016   K 4.8 03/01/2016   CL 99 03/01/2016   CREATININE 1.22 (H) 03/01/2016   BUN 16 03/01/2016   CO2 30 03/01/2016   TSH 1.74 11/03/2015   INR 1.00 10/31/2010   HGBA1C 7.6 (H) 03/01/2016   MICROALBUR 1.1 11/03/2015    Mr Femur Left W Wo Contrast  Result Date: 02/13/2016 CLINICAL DATA:  Pain in proximal upper thigh/buttock area for about 6 months - marked with vit E  capsule, especially when sitting on it. History of multiple surgeries in that area in the 1970s to remove benign growths according to patient. EXAM: MR OF THE LEFT LOWER EXTREMITY WITHOUT AND WITH CONTRAST TECHNIQUE: Multiplanar, multisequence MR imaging of the lower left extremity was performed both before and after administration of intravenous contrast. CONTRAST:  59m MULTIHANCE GADOBENATE DIMEGLUMINE 529 MG/ML IV SOLN COMPARISON:  None. FINDINGS: Bones/Joint/Cartilage No marrow signal abnormality. No fracture or dislocation. Normal alignment. No joint effusion. Mild osteoarthritis of bilateral hips. Muscles and Tendons Muscles are normal. No muscle edema or atrophy. No intramuscular hematoma or fluid collection. Complete tear of the left semitendinosis and biceps femoris tendon from the ischial tuberosity. Soft tissue No soft tissue mass or hematoma.  No fluid collection. Hazy T1 low signal in the subcutaneous fat along the inferior medial margin of the left gluteus maximus muscle without significant enhancement on postcontrast imaging likely reflecting  scarring from prior surgery. No recurrent soft tissue mass. IMPRESSION: 1. Postsurgical changes in the subcutaneous fat along the inferior medial border of the left gluteus maximus. No recurrent soft tissue mass. 2. Complete tear of the left semitendinosis and biceps femoris tendon from the ischial tuberosity. Electronically Signed   By: Kathreen Devoid   On: 02/13/2016 16:07    Assessment & Plan:   Demetrice was seen today for hypertension, gastroesophageal reflux and diabetes.  Diagnoses and all orders for this visit:  Gastroesophageal reflux disease with esophagitis- will continue the PPI for symptom relief -     Ambulatory referral to Gastroenterology  Odynophagia- I've asked her to see GI again to see if upper endoscopy with dilation is indicated -     Ambulatory referral to Gastroenterology  Essential hypertension- her blood pressure is  well-controlled, electrolytes and renal function are stable. -     Basic metabolic panel; Future  Type 2 diabetes mellitus with complication, without long-term current use of insulin ([place])- her A1c is 7.5%, her blood sugars are adequately well controlled. -     Hemoglobin A1c; Future -     Basic metabolic panel; Future  Diabetic retinopathy with macular edema associated with diabetes mellitus due to underlying condition, unspecified retinopathy severity ([place]) -     Hemoglobin A1c; Future   I have discontinued Ms. Singleton's meloxicam. I am also having her maintain her glucose blood, ACCU-CHEK AVIVA PLUS, glucose blood, accu-chek multiclix, enalapril, ACCU-CHEK AVIVA PLUS, tiZANidine, oxybutynin, beta carotene w/minerals, metFORMIN, furosemide, traMADol, and omeprazole.  No orders of the defined types were placed in this encounter.    Follow-up: Return in about 4 months (around 07/01/2016).  Scarlette Calico, MD

## 2016-03-01 NOTE — Patient Instructions (Signed)

## 2016-03-08 DIAGNOSIS — M25552 Pain in left hip: Secondary | ICD-10-CM | POA: Diagnosis not present

## 2016-03-20 ENCOUNTER — Other Ambulatory Visit: Payer: Self-pay | Admitting: Internal Medicine

## 2016-03-22 ENCOUNTER — Other Ambulatory Visit: Payer: Self-pay | Admitting: *Deleted

## 2016-03-22 ENCOUNTER — Encounter (HOSPITAL_COMMUNITY): Payer: Self-pay

## 2016-03-22 ENCOUNTER — Encounter (HOSPITAL_COMMUNITY)
Admission: EM | Disposition: A | Discharge status: Home / Self Care | Payer: Self-pay | Source: Home / Self Care | Attending: Cardiothoracic Surgery

## 2016-03-22 ENCOUNTER — Emergency Department (HOSPITAL_COMMUNITY): Payer: Commercial Managed Care - HMO

## 2016-03-22 ENCOUNTER — Inpatient Hospital Stay (HOSPITAL_COMMUNITY)
Admission: EM | Admit: 2016-03-22 | Discharge: 2016-03-30 | DRG: 233 | Disposition: A | Discharge status: Home / Self Care | Payer: Commercial Managed Care - HMO | Attending: Cardiothoracic Surgery | Admitting: Cardiothoracic Surgery

## 2016-03-22 DIAGNOSIS — Z96653 Presence of artificial knee joint, bilateral: Secondary | ICD-10-CM | POA: Diagnosis present

## 2016-03-22 DIAGNOSIS — K219 Gastro-esophageal reflux disease without esophagitis: Secondary | ICD-10-CM | POA: Diagnosis present

## 2016-03-22 DIAGNOSIS — E11319 Type 2 diabetes mellitus with unspecified diabetic retinopathy without macular edema: Secondary | ICD-10-CM | POA: Diagnosis present

## 2016-03-22 DIAGNOSIS — Z951 Presence of aortocoronary bypass graft: Secondary | ICD-10-CM | POA: Diagnosis not present

## 2016-03-22 DIAGNOSIS — I10 Essential (primary) hypertension: Secondary | ICD-10-CM | POA: Diagnosis present

## 2016-03-22 DIAGNOSIS — Z9841 Cataract extraction status, right eye: Secondary | ICD-10-CM

## 2016-03-22 DIAGNOSIS — Z7984 Long term (current) use of oral hypoglycemic drugs: Secondary | ICD-10-CM

## 2016-03-22 DIAGNOSIS — I480 Paroxysmal atrial fibrillation: Secondary | ICD-10-CM | POA: Diagnosis present

## 2016-03-22 DIAGNOSIS — E1122 Type 2 diabetes mellitus with diabetic chronic kidney disease: Secondary | ICD-10-CM | POA: Diagnosis not present

## 2016-03-22 DIAGNOSIS — I5021 Acute systolic (congestive) heart failure: Secondary | ICD-10-CM | POA: Diagnosis not present

## 2016-03-22 DIAGNOSIS — N183 Chronic kidney disease, stage 3 unspecified: Secondary | ICD-10-CM | POA: Diagnosis present

## 2016-03-22 DIAGNOSIS — E1140 Type 2 diabetes mellitus with diabetic neuropathy, unspecified: Secondary | ICD-10-CM | POA: Diagnosis present

## 2016-03-22 DIAGNOSIS — I130 Hypertensive heart and chronic kidney disease with heart failure and stage 1 through stage 4 chronic kidney disease, or unspecified chronic kidney disease: Secondary | ICD-10-CM | POA: Diagnosis present

## 2016-03-22 DIAGNOSIS — N184 Chronic kidney disease, stage 4 (severe): Secondary | ICD-10-CM | POA: Diagnosis present

## 2016-03-22 DIAGNOSIS — D62 Acute posthemorrhagic anemia: Secondary | ICD-10-CM | POA: Diagnosis not present

## 2016-03-22 DIAGNOSIS — I255 Ischemic cardiomyopathy: Secondary | ICD-10-CM | POA: Diagnosis present

## 2016-03-22 DIAGNOSIS — Z9689 Presence of other specified functional implants: Secondary | ICD-10-CM

## 2016-03-22 DIAGNOSIS — Z01810 Encounter for preprocedural cardiovascular examination: Secondary | ICD-10-CM | POA: Diagnosis not present

## 2016-03-22 DIAGNOSIS — Z79899 Other long term (current) drug therapy: Secondary | ICD-10-CM | POA: Diagnosis not present

## 2016-03-22 DIAGNOSIS — E118 Type 2 diabetes mellitus with unspecified complications: Secondary | ICD-10-CM | POA: Diagnosis present

## 2016-03-22 DIAGNOSIS — R079 Chest pain, unspecified: Secondary | ICD-10-CM | POA: Diagnosis present

## 2016-03-22 DIAGNOSIS — H9190 Unspecified hearing loss, unspecified ear: Secondary | ICD-10-CM | POA: Diagnosis present

## 2016-03-22 DIAGNOSIS — E785 Hyperlipidemia, unspecified: Secondary | ICD-10-CM | POA: Diagnosis present

## 2016-03-22 DIAGNOSIS — I2510 Atherosclerotic heart disease of native coronary artery without angina pectoris: Secondary | ICD-10-CM

## 2016-03-22 DIAGNOSIS — Z9842 Cataract extraction status, left eye: Secondary | ICD-10-CM | POA: Diagnosis not present

## 2016-03-22 DIAGNOSIS — F329 Major depressive disorder, single episode, unspecified: Secondary | ICD-10-CM | POA: Diagnosis present

## 2016-03-22 DIAGNOSIS — R9431 Abnormal electrocardiogram [ECG] [EKG]: Secondary | ICD-10-CM | POA: Diagnosis not present

## 2016-03-22 DIAGNOSIS — J90 Pleural effusion, not elsewhere classified: Secondary | ICD-10-CM | POA: Diagnosis not present

## 2016-03-22 DIAGNOSIS — I080 Rheumatic disorders of both mitral and aortic valves: Secondary | ICD-10-CM | POA: Diagnosis not present

## 2016-03-22 DIAGNOSIS — I25110 Atherosclerotic heart disease of native coronary artery with unstable angina pectoris: Secondary | ICD-10-CM | POA: Diagnosis present

## 2016-03-22 DIAGNOSIS — Z85828 Personal history of other malignant neoplasm of skin: Secondary | ICD-10-CM | POA: Diagnosis not present

## 2016-03-22 DIAGNOSIS — Z09 Encounter for follow-up examination after completed treatment for conditions other than malignant neoplasm: Secondary | ICD-10-CM

## 2016-03-22 DIAGNOSIS — I214 Non-ST elevation (NSTEMI) myocardial infarction: Secondary | ICD-10-CM | POA: Diagnosis not present

## 2016-03-22 DIAGNOSIS — Z452 Encounter for adjustment and management of vascular access device: Secondary | ICD-10-CM | POA: Diagnosis not present

## 2016-03-22 DIAGNOSIS — I200 Unstable angina: Secondary | ICD-10-CM | POA: Diagnosis present

## 2016-03-22 DIAGNOSIS — R1013 Epigastric pain: Secondary | ICD-10-CM | POA: Diagnosis present

## 2016-03-22 DIAGNOSIS — Z4682 Encounter for fitting and adjustment of non-vascular catheter: Secondary | ICD-10-CM | POA: Diagnosis not present

## 2016-03-22 HISTORY — PX: CARDIAC CATHETERIZATION: SHX172

## 2016-03-22 LAB — GLUCOSE, CAPILLARY
GLUCOSE-CAPILLARY: 119 mg/dL — AB (ref 65–99)
Glucose-Capillary: 161 mg/dL — ABNORMAL HIGH (ref 65–99)

## 2016-03-22 LAB — CBC
HEMATOCRIT: 40.8 % (ref 36.0–46.0)
Hemoglobin: 13.3 g/dL (ref 12.0–15.0)
MCH: 29.4 pg (ref 26.0–34.0)
MCHC: 32.6 g/dL (ref 30.0–36.0)
MCV: 90.1 fL (ref 78.0–100.0)
Platelets: 322 10*3/uL (ref 150–400)
RBC: 4.53 MIL/uL (ref 3.87–5.11)
RDW: 13.7 % (ref 11.5–15.5)
WBC: 10.7 10*3/uL — AB (ref 4.0–10.5)

## 2016-03-22 LAB — BASIC METABOLIC PANEL
Anion gap: 14 (ref 5–15)
BUN: 19 mg/dL (ref 6–20)
CO2: 26 mmol/L (ref 22–32)
Calcium: 10.1 mg/dL (ref 8.9–10.3)
Chloride: 95 mmol/L — ABNORMAL LOW (ref 101–111)
Creatinine, Ser: 1.3 mg/dL — ABNORMAL HIGH (ref 0.44–1.00)
GFR calc Af Amer: 43 mL/min — ABNORMAL LOW (ref >60)
GFR, EST NON AFRICAN AMERICAN: 37 mL/min — AB (ref >60)
Glucose, Bld: 177 mg/dL — ABNORMAL HIGH (ref 65–99)
POTASSIUM: 4.3 mmol/L (ref 3.5–5.1)
SODIUM: 135 mmol/L (ref 135–145)

## 2016-03-22 LAB — I-STAT TROPONIN, ED: TROPONIN I, POC: 1.03 ng/mL — AB (ref 0.00–0.08)

## 2016-03-22 LAB — CBG MONITORING, ED: Glucose-Capillary: 126 mg/dL — ABNORMAL HIGH (ref 65–99)

## 2016-03-22 LAB — MRSA PCR SCREENING: MRSA by PCR: NEGATIVE

## 2016-03-22 LAB — PROTIME-INR
INR: 1.04
Prothrombin Time: 13.6 seconds (ref 11.4–15.2)

## 2016-03-22 LAB — APTT: aPTT: 32 seconds (ref 24–36)

## 2016-03-22 LAB — TROPONIN I: TROPONIN I: 1.31 ng/mL — AB (ref <0.03)

## 2016-03-22 SURGERY — LEFT HEART CATH AND CORONARY ANGIOGRAPHY

## 2016-03-22 MED ORDER — METOPROLOL TARTRATE 12.5 MG HALF TABLET
12.5000 mg | ORAL_TABLET | Freq: Two times a day (BID) | ORAL | Status: DC
  Administered 2016-03-22: 12.5 mg via ORAL
  Filled 2016-03-22: qty 1

## 2016-03-22 MED ORDER — NITROGLYCERIN 0.4 MG SL SUBL: 0.4000 mg | SUBLINGUAL_TABLET | SUBLINGUAL | Status: DC | PRN

## 2016-03-22 MED ORDER — SODIUM CHLORIDE 0.9 % IV SOLN
INTRAVENOUS | Status: DC
Start: 2016-03-22 — End: 2016-03-23
  Administered 2016-03-22: 16:00:00 via INTRAVENOUS

## 2016-03-22 MED ORDER — SODIUM CHLORIDE 0.9 % IV SOLN
INTRAVENOUS | Status: DC
  Administered 2016-03-22 (×2): via INTRAVENOUS

## 2016-03-22 MED ORDER — TRAMADOL HCL 50 MG PO TABS: 50.0000 mg | ORAL_TABLET | Freq: Four times a day (QID) | ORAL | Status: DC | PRN

## 2016-03-22 MED ORDER — ACETAMINOPHEN 325 MG PO TABS: 650.0000 mg | ORAL_TABLET | ORAL | Status: DC | PRN

## 2016-03-22 MED ORDER — HEPARIN (PORCINE) IN NACL 100-0.45 UNIT/ML-% IJ SOLN
750.0000 [IU]/h | INTRAMUSCULAR | Status: DC
  Administered 2016-03-22: 750 [IU]/h via INTRAVENOUS
  Filled 2016-03-22: qty 250

## 2016-03-22 MED ORDER — HEPARIN (PORCINE) IN NACL 100-0.45 UNIT/ML-% IJ SOLN
950.0000 [IU]/h | INTRAMUSCULAR | Status: DC
  Administered 2016-03-22: 750 [IU]/h via INTRAVENOUS
  Administered 2016-03-23: 950 [IU]/h via INTRAVENOUS
  Filled 2016-03-22 (×2): qty 250

## 2016-03-22 MED ORDER — SODIUM CHLORIDE 0.9% FLUSH
3.0000 mL | Freq: Two times a day (BID) | INTRAVENOUS | Status: DC
  Administered 2016-03-23 (×2): 3 mL via INTRAVENOUS

## 2016-03-22 MED ORDER — ASPIRIN 81 MG PO CHEW: 81.0000 mg | CHEWABLE_TABLET | Freq: Every day | ORAL | Status: DC

## 2016-03-22 MED ORDER — OXYBUTYNIN CHLORIDE ER 5 MG PO TB24
5.0000 mg | ORAL_TABLET | Freq: Every day | ORAL | Status: DC | PRN
  Filled 2016-03-22: qty 1

## 2016-03-22 MED ORDER — SODIUM CHLORIDE 0.9% FLUSH: 3.0000 mL | INTRAVENOUS | Status: DC | PRN

## 2016-03-22 MED ORDER — HEPARIN SODIUM (PORCINE) 1000 UNIT/ML IJ SOLN
INTRAMUSCULAR | Status: DC | PRN
  Administered 2016-03-22: 3500 [IU] via INTRAVENOUS

## 2016-03-22 MED ORDER — PANTOPRAZOLE SODIUM 40 MG PO TBEC
40.0000 mg | DELAYED_RELEASE_TABLET | Freq: Every day | ORAL | Status: DC
  Administered 2016-03-23: 40 mg via ORAL
  Filled 2016-03-22: qty 1

## 2016-03-22 MED ORDER — SODIUM CHLORIDE 0.9 % WEIGHT BASED INFUSION: 1.0000 mL/kg/h | INTRAVENOUS | Status: AC

## 2016-03-22 MED ORDER — HEPARIN BOLUS VIA INFUSION
3500.0000 [IU] | Freq: Once | INTRAVENOUS | Status: AC
  Administered 2016-03-22: 3500 [IU] via INTRAVENOUS
  Filled 2016-03-22: qty 3500

## 2016-03-22 MED ORDER — ASPIRIN EC 81 MG PO TBEC
81.0000 mg | DELAYED_RELEASE_TABLET | Freq: Every day | ORAL | Status: DC
  Administered 2016-03-23: 81 mg via ORAL
  Filled 2016-03-22 (×2): qty 1

## 2016-03-22 MED ORDER — HEPARIN (PORCINE) IN NACL 2-0.9 UNIT/ML-% IJ SOLN
INTRAMUSCULAR | Status: DC | PRN
  Administered 2016-03-22: 1500 mL

## 2016-03-22 MED ORDER — ASPIRIN 81 MG PO CHEW
324.0000 mg | CHEWABLE_TABLET | Freq: Once | ORAL | Status: AC
  Administered 2016-03-22: 324 mg via ORAL
  Filled 2016-03-22: qty 4

## 2016-03-22 MED ORDER — ONDANSETRON HCL 4 MG/2ML IJ SOLN: 4.0000 mg | Freq: Four times a day (QID) | INTRAMUSCULAR | Status: DC | PRN

## 2016-03-22 MED ORDER — SODIUM CHLORIDE 0.9% FLUSH
3.0000 mL | Freq: Two times a day (BID) | INTRAVENOUS | Status: DC
  Administered 2016-03-22 (×2): 3 mL via INTRAVENOUS

## 2016-03-22 MED ORDER — SODIUM CHLORIDE 0.9 % IV SOLN: 250.0000 mL | INTRAVENOUS | Status: DC | PRN

## 2016-03-22 MED ORDER — ACETAMINOPHEN 325 MG PO TABS
650.0000 mg | ORAL_TABLET | ORAL | Status: DC | PRN
  Administered 2016-03-23: 650 mg via ORAL
  Filled 2016-03-22: qty 2

## 2016-03-22 MED ORDER — ATORVASTATIN CALCIUM 40 MG PO TABS
40.0000 mg | ORAL_TABLET | Freq: Every day | ORAL | Status: DC
  Administered 2016-03-22 – 2016-03-23 (×2): 40 mg via ORAL
  Filled 2016-03-22 (×3): qty 1

## 2016-03-22 MED ORDER — LIDOCAINE HCL (PF) 1 % IJ SOLN
INTRAMUSCULAR | Status: DC | PRN
  Administered 2016-03-22: 2 mL

## 2016-03-22 MED ORDER — NITROGLYCERIN IN D5W 200-5 MCG/ML-% IV SOLN
5.0000 ug/min | INTRAVENOUS | Status: DC
  Administered 2016-03-22: 5 ug/min via INTRAVENOUS
  Filled 2016-03-22: qty 250

## 2016-03-22 MED ORDER — PROSIGHT PO TABS
1.0000 | ORAL_TABLET | Freq: Every day | ORAL | Status: DC
  Administered 2016-03-23: 1 via ORAL
  Filled 2016-03-22 (×3): qty 1

## 2016-03-22 MED ORDER — HEPARIN (PORCINE) IN NACL 100-0.45 UNIT/ML-% IJ SOLN: 750.0000 [IU]/h | INTRAMUSCULAR | Status: DC

## 2016-03-22 MED ORDER — INSULIN ASPART 100 UNIT/ML ~~LOC~~ SOLN: 0.0000 [IU] | Freq: Every day | SUBCUTANEOUS | Status: DC

## 2016-03-22 MED ORDER — VERAPAMIL HCL 2.5 MG/ML IV SOLN
INTRAVENOUS | Status: DC | PRN
  Administered 2016-03-22: 10 mL via INTRA_ARTERIAL

## 2016-03-22 MED ORDER — INSULIN ASPART 100 UNIT/ML ~~LOC~~ SOLN
0.0000 [IU] | Freq: Three times a day (TID) | SUBCUTANEOUS | Status: DC
  Administered 2016-03-23: 1 [IU] via SUBCUTANEOUS
  Administered 2016-03-23 (×2): 2 [IU] via SUBCUTANEOUS

## 2016-03-22 MED ORDER — MIDAZOLAM HCL 2 MG/2ML IJ SOLN
INTRAMUSCULAR | Status: DC | PRN
  Administered 2016-03-22 (×2): 1 mg via INTRAVENOUS

## 2016-03-22 MED ORDER — FENTANYL CITRATE (PF) 100 MCG/2ML IJ SOLN
INTRAMUSCULAR | Status: DC | PRN
  Administered 2016-03-22 (×2): 25 ug via INTRAVENOUS

## 2016-03-22 SURGICAL SUPPLY — 9 items
CATH INFINITI 5 FR JL3.5 (CATHETERS) ×2 IMPLANT
CATH INFINITI JR4 5F (CATHETERS) ×2 IMPLANT
DEVICE RAD COMP TR BAND LRG (VASCULAR PRODUCTS) ×2 IMPLANT
GLIDESHEATH SLEND SS 6F .021 (SHEATH) ×2 IMPLANT
KIT HEART LEFT (KITS) ×3 IMPLANT
PACK CARDIAC CATHETERIZATION (CUSTOM PROCEDURE TRAY) ×3 IMPLANT
TRANSDUCER W/STOPCOCK (MISCELLANEOUS) ×3 IMPLANT
TUBING CIL FLEX 10 FLL-RA (TUBING) ×3 IMPLANT
WIRE HI TORQ VERSACORE-J 145CM (WIRE) ×2 IMPLANT

## 2016-03-22 NOTE — Consult Note (Addendum)
[address]       [place], [address]             [phone]        Miaisabella F Arviso [place] Medical Record #[id number] Date of Birth: Nov 16, 1931  Referring: Scarlette Calico Primary Care: Scarlette Calico, MD  Chief Complaint:    Chief Complaint  Patient presents with  . Chest Pain    History of Present Illness:      Patient is an 82 yo WF with known history of DM with retinopathy, Hypertension, and no previous CAD.  She presented to [facility] ED today with complaints of epigastric pain that woke her from sleep.  She describes the pain as being severe.  She denies associated N/V, dyspnea, or diaphoresis, but does state the pain radiates to her back.  The patient states she has been experiencing this type of pain over the past several weeks.  She has tried antacids for this which provide relief, but her symptoms recur.  Workup in ED shows elevated Troponin level and EKG had ST changes.  On exam she was pain free, but continued to have episodes of chest pain since being in the ED.  She was ruled in for STEMI and transferred to [facility] for further workup.  She was taken to the cath lab and found to have severe multivessel CAD and a reduced EF.  She was admitted and started on Heparin.  Currently the patient states she is feeling better.  She is not having chest pain since her cath.  She states she is overall very active and has been able to do what she wants during the day, but over the past month she has had to slow down due to her symptoms.  She denies history of nicotine abuse.     Current Activity/ Functional Status: Patient is independent with mobility/ambulation, transfers, ADL's, IADL's.   Zubrod Score: At the time of surgery this patient's most appropriate activity status/level should be described as: '[]'     0    Normal activity, no symptoms '[]'     1    Restricted in physical strenuous activity but ambulatory, able to do out light work '[x]'     2    Ambulatory and capable  of self care, unable to do work activities, up and about                 more than 50%  Of the time                            '[]'     3    Only limited self care, in bed greater than 50% of waking hours '[]'     4    Completely disabled, no self care, confined to bed or chair '[]'     5    Moribund  Past Medical History:  Diagnosis Date  . Cancer ([place])    basal cell cancer on face  . Cervical spine arthritis ([place])   . Depression   . Diabetic retinopathy ([place])   . DJD (degenerative joint disease)   . DM (diabetes mellitus), type 2 ([place])    type 2  . Esophageal stricture   . GERD (gastroesophageal reflux disease)   . History of hiatal hernia   . Hypertension     Past Surgical History:  Procedure Laterality Date  . ABDOMINAL HYSTERECTOMY    . AIR/FLUID EXCHANGE Left  08/10/2015   Procedure: AIR/FLUID EXCHANGE, left eye;  Surgeon: Hayden Pedro, MD;  Location: [facility];  Service: Ophthalmology;  Laterality: Left;  . benign tumor removed from left hip    . CATARACT EXTRACTION, BILATERAL  July 2013   Left done on 12/18/11 and right done on 01/15/2012  . CHOLECYSTECTOMY    . DILATION AND CURETTAGE OF UTERUS    . LASER PHOTO ABLATION Left 08/10/2015   Procedure: LASER PHOTO ABLATION, left eye;  Surgeon: Hayden Pedro, MD;  Location: [facility];  Service: Ophthalmology;  Laterality: Left;  . left breast lumpectomy    . MEMBRANE PEEL Left 08/10/2015   Procedure: MEMBRANE PEEL, left eye;  Surgeon: Hayden Pedro, MD;  Location: [facility];  Service: Ophthalmology;  Laterality: Left;  . PARS PLANA VITRECTOMY Left 08/10/2015   Procedure: PARS PLANA VITRECTOMY WITH 25 GAUGE, headscope laser;  Surgeon: Hayden Pedro, MD;  Location: [facility];  Service: Ophthalmology;  Laterality: Left;  . TOTAL KNEE ARTHROPLASTY     left Jan '12; right May '12 - by Dr. Alvan Dame  . TUBAL LIGATION      History  Smoking Status  . Never Smoker  Smokeless Tobacco  . Never Used    History  Alcohol Use No    Social History    Social History  . Marital status: Widowed    Spouse name: N/A  . Number of children: N/A  . Years of education: N/A   Occupational History  . Not on file.   Social History Main Topics  . Smoking status: Never Smoker  . Smokeless tobacco: Never Used  . Alcohol use No  . Drug use: No  . Sexual activity: Not Currently   Other Topics Concern  . Not on file   Social History Narrative   Married [phone]   5 sons- '56, '57, '59, '62, '70   Grandchildren 10, 6 great grandchildren   Lives in own home, son lives with her. I- ADLs   Discussed end of life care: would not want resuscitation or being maintained with mechanical ventilation.   Daily caffeine use 1 cup of coffee per day    Allergies  Allergen Reactions  . Vicoprofen [Hydrocodone-Ibuprofen] Rash  . Aspirin Other (See Comments)    GI upset  . Codeine Nausea And Vomiting    Current Facility-Administered Medications  Medication Dose Route Frequency Provider Last Rate Last Dose  . 0.9 %  sodium chloride infusion  250 mL Intravenous PRN Erlene Quan, PA-C   Stopped at 03/22/16 1221  . 0.9 %  sodium chloride infusion   Intravenous Continuous Erlene Quan, PA-C   Stopped at 03/22/16 1600  . 0.9 %  sodium chloride infusion   Intravenous Continuous Erlene Quan, PA-C 75 mL/hr at 03/22/16 1600    . 0.9 %  sodium chloride infusion  250 mL Intravenous PRN Jettie Booze, MD      . 0.9% sodium chloride infusion  1 mL/kg/hr Intravenous Continuous Jettie Booze, MD      . acetaminophen (TYLENOL) tablet 650 mg  650 mg Oral Q4H PRN Jettie Booze, MD      . Derrill Memo ON 03/23/2016] aspirin EC tablet 81 mg  81 mg Oral Daily Luke K Kilroy, PA-C      . atorvastatin (LIPITOR) tablet 40 mg  40 mg Oral q1800 Luke K Kilroy, PA-C      . heparin ADULT infusion 100 units/mL (25000 units/213m sodium chloride 0.45%)  750 Units/hr Intravenous Continuous Jettie Booze, MD      . insulin aspart (novoLOG) injection 0-5 Units   0-5 Units Subcutaneous QHS Luke K Kilroy, PA-C      . insulin aspart (novoLOG) injection 0-9 Units  0-9 Units Subcutaneous TID WC Luke K Kilroy, PA-C      . metoprolol tartrate (LOPRESSOR) tablet 12.5 mg  12.5 mg Oral BID Luke K Kilroy, PA-C      . [START ON 03/23/2016] multivitamin (PROSIGHT) tablet 1 tablet  1 tablet Oral Daily Luke K Kilroy, PA-C      . nitroGLYCERIN (NITROSTAT) SL tablet 0.4 mg  0.4 mg Sublingual Q5 Min x 3 PRN Doreene Burke Kilroy, PA-C      . nitroGLYCERIN 50 mg in dextrose 5 % 250 mL (0.2 mg/mL) infusion  5 mcg/min Intravenous Continuous Erlene Quan, PA-C 1.5 mL/hr at 03/22/16 1222 5 mcg/min at 03/22/16 1222  . ondansetron (ZOFRAN) injection 4 mg  4 mg Intravenous Q6H PRN Jettie Booze, MD      . oxybutynin (DITROPAN-XL) 24 hr tablet 5 mg  5 mg Oral Daily PRN Erlene Quan, PA-C      . [START ON 03/23/2016] pantoprazole (PROTONIX) EC tablet 40 mg  40 mg Oral Daily Luke K Kilroy, PA-C      . sodium chloride flush (NS) 0.9 % injection 3 mL  3 mL Intravenous Q12H Doreene Burke Kilroy, PA-C   3 mL at 03/22/16 1231  . sodium chloride flush (NS) 0.9 % injection 3 mL  3 mL Intravenous PRN Doreene Burke Kilroy, PA-C      . sodium chloride flush (NS) 0.9 % injection 3 mL  3 mL Intravenous Q12H Jettie Booze, MD      . sodium chloride flush (NS) 0.9 % injection 3 mL  3 mL Intravenous PRN Jettie Booze, MD      . traMADol Veatrice Bourbon) tablet 50 mg  50 mg Oral Q6H PRN Erlene Quan, PA-C        Prescriptions Prior to Admission  Medication Sig Dispense Refill Last Dose  . beta carotene w/minerals (OCUVITE) tablet Take 1 tablet by mouth daily.   03/21/2016 at Unknown time  . enalapril (VASOTEC) 20 MG tablet Take 1 tablet (20 mg total) by mouth daily. 90 tablet 3 03/22/2016 at Unknown time  . furosemide (LASIX) 20 MG tablet TAKE 1 TABLET (20 MG TOTAL) BY MOUTH DAILY. 90 tablet 1 03/22/2016 at Unknown time  . meloxicam (MOBIC) 15 MG tablet TAKE 1 TABLET (15 MG TOTAL) BY MOUTH DAILY. (Patient  taking differently: TAKE 1 TABLET (15 MG TOTAL) BY MOUTH DAILY IN THE EVENING) 90 tablet 1 03/21/2016 at Unknown time  . metFORMIN (GLUCOPHAGE) 1000 MG tablet Take 1 tablet (1,000 mg total) by mouth 2 (two) times daily with a meal. 180 tablet 3 03/21/2016 at Unknown time  . omeprazole (PRILOSEC) 20 MG capsule Take 1 capsule (20 mg total) by mouth daily. 90 capsule 3 03/21/2016 at Unknown time  . oxybutynin (DITROPAN-XL) 5 MG 24 hr tablet Take 1 tablet (5 mg total) by mouth daily. (Patient taking differently: Take 5 mg by mouth daily as needed (for frequency). ) 90 tablet 3 unknown  . traMADol (ULTRAM) 50 MG tablet Take 1 tablet (50 mg total) by mouth every 6 (six) hours as needed. 360 tablet 1 Past Week at Unknown time  . ACCU-CHEK AVIVA PLUS test strip USE TO CHECK BLOOD SUGAR TWICE DAILY 200 each 3 Taking  .  Blood Glucose Monitoring Suppl (ACCU-CHEK AVIVA PLUS) W/DEVICE KIT Use to check blood sugars daily 1 kit 0 Taking  . glucose blood (ACCU-CHEK AVIVA PLUS) test strip Use to check blood sugar twice a day Dx 250.00 180 each 3 Taking  . glucose blood (ACCU-CHEK COMPACT TEST DRUM) test strip Use as instructed 100 each 12 Taking  . Lancets (ACCU-CHEK MULTICLIX) lancets Use to help check blood sugars twice a day Dx 250.00 180 each 3 Taking  . tiZANidine (ZANAFLEX) 2 MG tablet TAKE 1 TABLET EVERY 6 HOURS AS NEEDED FOR MUSCLE SPASMS (Patient not taking: Reported on 03/22/2016) 120 tablet 3 Not Taking at Unknown time    Family History  Problem Relation Age of Onset  . Diabetes Father    Review of Systems:  Constitutional: negative for chills, fevers, night sweats and weight loss Respiratory: positive for pleurisy/chest pain Cardiovascular: positive for chest pain, chest pressure/discomfort and exertional chest pressure/discomfort Gastrointestinal: positive for dyspepsia and dysphagia, negative for abdominal pain, nausea and vomiting Integument/breast: negative Neurological: negative     Cardiac  Review of Systems: Y or N  Chest Pain [  y  ]  Resting SOB [ n  ] Exertional SOB  [  ]  Orthopnea [  ]   Pedal Edema [   ]    Palpitations [n  ] Syncope  [  ]   Presyncope [   ]  General Review of Systems: [Y] = yes [  ]=no Constitional: recent weight change [  ]; anorexia [  ]; fatigue [  ]; nausea [ n ]; night sweats [  ]; fever [  ]; or chills [  ]                                                               Dental: poor dentition[  ]; Last Dentist visit:   Eye : blurred vision [  ]; diplopia [   ]; vision changes [  ];  Amaurosis fugax[  ]; Resp: cough [n  ];  wheezing[n  ];  hemoptysis[  ]; shortness of breath[  ]; paroxysmal nocturnal dyspnea[  ]; dyspnea on exertion[ n ]; or orthopnea[  ];  GI:  gallstones[  ], vomiting[  ];  dysphagia[y  ]; melena[  ];  hematochezia [  ]; heartburn[y  ];   Hx of  Colonoscopy[  ]; GU: kidney stones [  ]; hematuria[  ];   dysuria [  ];  nocturia[  ];  history of     obstruction [  ]; urinary frequency [  ]             Skin: rash, swelling[  ];, hair loss[  ];  peripheral edema[  ];  or itching[  ]; Musculosketetal: myalgias[  ];  joint swelling[  ];  joint erythema[  ];  joint pain[  ];  back pain[  ];  Heme/Lymph: bruising[  ];  bleeding[  ];  anemia[  ];  Neuro: TIA[  ];  headaches[  ];  stroke[ n ];  vertigo[  ];  seizures[  ];   paresthesias[n  ];  difficulty walking[  ];  Psych:depression[  ]; anxiety[  ];  Endocrine: diabetes[ y ];  thyroid dysfunction[  ];  Immunizations: Flu [  ];  Pneumococcal[  ];  Other:  Physical Exam: BP 108/71   Pulse 79   Temp 97.8 F (36.6 C) (Oral)   Resp 20   Ht 5' (1.524 m)   Wt 141 lb 1.5 oz (64 kg)   SpO2 99%   BMI 27.56 kg/m   General appearance: alert, cooperative and no distress Head: Normocephalic, without obvious abnormality, atraumatic Neck: no adenopathy, no carotid bruit, no JVD, supple, symmetrical, trachea midline and thyroid not enlarged, symmetric, no tenderness/mass/nodules Resp: clear to  auscultation bilaterally Cardio: regular rate and rhythm GI: soft, non-tender; bowel sounds normal; no masses,  no organomegaly Extremities: extremities normal, atraumatic, no cyanosis or edema Neurologic: Grossly normal  Diagnostic Studies & Laboratory data:     Recent Radiology Findings:   Dg Chest 2 View  Result Date: 03/22/2016 CLINICAL DATA:  Mid chest pain for 3 months. EXAM: CHEST  2 VIEW COMPARISON:  06/11/2015 FINDINGS: Heart and mediastinal contours are within normal limits. No focal opacities or effusions. No acute bony abnormality. IMPRESSION: No active cardiopulmonary disease. Electronically Signed   By: Rolm Baptise M.D.   On: 03/22/2016 10:14     I have independently reviewed the above radiologic studies.  Recent Lab Findings: Lab Results  Component Value Date   WBC 10.7 (H) 03/22/2016   HGB 13.3 03/22/2016   HCT 40.8 03/22/2016   PLT 322 03/22/2016   GLUCOSE 177 (H) 03/22/2016   CHOL 179 11/03/2015   TRIG 178.0 (H) 11/03/2015   HDL 45.20 11/03/2015   LDLDIRECT 90.0 05/05/2015   LDLCALC 98 11/03/2015   ALT 14 04/07/2014   AST 18 04/07/2014   NA 135 03/22/2016   K 4.3 03/22/2016   CL 95 (L) 03/22/2016   CREATININE 1.30 (H) 03/22/2016   BUN 19 03/22/2016   CO2 26 03/22/2016   TSH 1.74 11/03/2015   INR 1.04 03/22/2016   HGBA1C 7.6 (H) 03/01/2016   Lab Results  Component Value Date   CKTOTAL 60 11/03/2015   TROPONINI 1.31 (HH) 03/22/2016   CATH: Left Heart Cath and Coronary Angiography  Conclusion     Prox RCA to Mid RCA lesion, 70 %stenosed.  Prox Cx to Mid Cx lesion, 80 %stenosed.  1st Mrg lesion, 80 %stenosed.  Lat 1st Mrg lesion, 80 %stenosed.  Dist Cx lesion, 50 %stenosed.  Prox LAD lesion, 90 %stenosed.  Mid LAD to Dist LAD lesion, 90 %stenosed.  1st Diag lesion, 99 %stenosed.  The left ventricular ejection fraction is 35-45% by visual estimate.  There is mild to moderate left ventricular systolic dysfunction.  LV end diastolic  pressure is normal.  There is no aortic valve stenosis.   Severe multivessel disease.  Restart heparin in 8 hours.  CVTS consult for possible CABG.     I have independently reviewed the above  cath films and reviewed the findings with the  patient . Chronic Kidney Disease   Stage I     GFR >90  Stage II    GFR 60-89  Stage IIIA GFR 45-59  Stage IIIB GFR 30-44  Stage IV   GFR 15-29  Stage V    GFR  <15  Lab Results  Component Value Date   CREATININE 1.30 (H) 03/22/2016   Estimated Creatinine Clearance: 27.9 mL/min (by C-G formula based on SCr of 1.3 mg/dL (H)).    Assessment / Plan:      1. CAD- three vessel disease, poor quality distal lad target but with severe 3 vessel disease frequent angina symptoms  and now acute MI with acute systolic heart failure with EF 30%. I have recommended to patient to proceed with CABG. Risks and options discussed with her. Tentative plan Friday , if echo completed- still pending and renal function does not deteriorate further  2 DM with secondary CAD 3 Stage IV  Chronic Kidney disease GFR 15-29    I  spent 40 minutes counseling the patient face to face and 50% or more the  time was spent in counseling and coordination of care. The total time spent in the appointment was 60 minutes.  Grace Isaac MD      [address] [place] [address] Office [phone]   [place]

## 2016-03-22 NOTE — ED Triage Notes (Signed)
Pt here with chest pain x 3 months.  Has been evaluated and sent to cardiology.  However, cannot get in until December.  Notified MD again and told to go to GI.  Pt states feels like her food lodges on the way down.

## 2016-03-22 NOTE — ED Notes (Signed)
MD at bedside.EDP LITTLE PRESENT SPEAKING WITH PT

## 2016-03-22 NOTE — Progress Notes (Signed)
ANTICOAGULATION CONSULT NOTE - Follow up Pharmacy Consult for heparin Indication: chest pain/ACS  Allergies  Allergen Reactions  . Vicoprofen [Hydrocodone-Ibuprofen] Rash  . Aspirin Other (See Comments)    GI upset  . Codeine Nausea And Vomiting    Patient Measurements: Height: 5' (152.4 cm) Weight: 147 lb (66.7 kg) IBW/kg (Calculated) : 45.5 Heparin Dosing Weight: 60kg  Vital Signs: Temp: 98.1 F (36.7 C) (10/04 1321) Temp Source: Oral (10/04 1321) BP: 131/75 (10/04 1458) Pulse Rate: 0 (10/04 1513)  Labs:  Recent Labs  03/22/16 1034 03/22/16 1232  HGB 13.3  --   HCT 40.8  --   PLT 322  --   APTT  --  32  LABPROT  --  13.6  INR  --  1.04  CREATININE 1.30*  --   TROPONINI  --  1.31*    Estimated Creatinine Clearance: 28.4 mL/min (by C-G formula based on SCr of 1.3 mg/dL (H)).   Medical History: Past Medical History:  Diagnosis Date  . Cancer ([place])    basal cell cancer on face  . Cervical spine arthritis ([place])   . Depression   . Diabetic retinopathy ([place])   . DJD (degenerative joint disease)   . DM (diabetes mellitus), type 2 ([place])    type 2  . Esophageal stricture   . GERD (gastroesophageal reflux disease)   . History of hiatal hernia   . Hypertension     Medications:  Prescriptions Prior to Admission  Medication Sig Dispense Refill Last Dose  . beta carotene w/minerals (OCUVITE) tablet Take 1 tablet by mouth daily.   03/21/2016 at Unknown time  . enalapril (VASOTEC) 20 MG tablet Take 1 tablet (20 mg total) by mouth daily. 90 tablet 3 03/22/2016 at Unknown time  . furosemide (LASIX) 20 MG tablet TAKE 1 TABLET (20 MG TOTAL) BY MOUTH DAILY. 90 tablet 1 03/22/2016 at Unknown time  . meloxicam (MOBIC) 15 MG tablet TAKE 1 TABLET (15 MG TOTAL) BY MOUTH DAILY. (Patient taking differently: TAKE 1 TABLET (15 MG TOTAL) BY MOUTH DAILY IN THE EVENING) 90 tablet 1 03/21/2016 at Unknown time  . metFORMIN (GLUCOPHAGE) 1000 MG tablet Take 1 tablet (1,000 mg total) by  mouth 2 (two) times daily with a meal. 180 tablet 3 03/21/2016 at Unknown time  . omeprazole (PRILOSEC) 20 MG capsule Take 1 capsule (20 mg total) by mouth daily. 90 capsule 3 03/21/2016 at Unknown time  . oxybutynin (DITROPAN-XL) 5 MG 24 hr tablet Take 1 tablet (5 mg total) by mouth daily. (Patient taking differently: Take 5 mg by mouth daily as needed (for frequency). ) 90 tablet 3 unknown  . traMADol (ULTRAM) 50 MG tablet Take 1 tablet (50 mg total) by mouth every 6 (six) hours as needed. 360 tablet 1 Past Week at Unknown time  . ACCU-CHEK AVIVA PLUS test strip USE TO CHECK BLOOD SUGAR TWICE DAILY 200 each 3 Taking  . Blood Glucose Monitoring Suppl (ACCU-CHEK AVIVA PLUS) W/DEVICE KIT Use to check blood sugars daily 1 kit 0 Taking  . glucose blood (ACCU-CHEK AVIVA PLUS) test strip Use to check blood sugar twice a day Dx 250.00 180 each 3 Taking  . glucose blood (ACCU-CHEK COMPACT TEST DRUM) test strip Use as instructed 100 each 12 Taking  . Lancets (ACCU-CHEK MULTICLIX) lancets Use to help check blood sugars twice a day Dx 250.00 180 each 3 Taking  . tiZANidine (ZANAFLEX) 2 MG tablet TAKE 1 TABLET EVERY 6 HOURS AS NEEDED FOR MUSCLE SPASMS (Patient  not taking: Reported on 03/22/2016) 120 tablet 3 Not Taking at Unknown time   Scheduled:  . [START ON 03/23/2016] aspirin EC  81 mg Oral Daily  . atorvastatin  40 mg Oral q1800  . insulin aspart  0-5 Units Subcutaneous QHS  . insulin aspart  0-9 Units Subcutaneous TID WC  . metoprolol tartrate  12.5 mg Oral BID  . [START ON 03/23/2016] multivitamin  1 tablet Oral Daily  . [START ON 03/23/2016] pantoprazole  40 mg Oral Daily  . sodium chloride flush  3 mL Intravenous Q12H  . sodium chloride flush  3 mL Intravenous Q12H   Infusions:  . sodium chloride 75 mL/hr at 03/22/16 1234  . sodium chloride 75 mL/hr at 03/22/16 1600  . sodium chloride    . nitroGLYCERIN 5 mcg/min (03/22/16 1222)   PRN: sodium chloride, sodium chloride, acetaminophen,  nitroGLYCERIN, ondansetron (ZOFRAN) IV, oxybutynin, sodium chloride flush, sodium chloride flush, traMADol  Assessment: 82yo Female on heparin IV infusion per pharmacy consult for ACS/STEMI. CBC is wnl, SCr is elevated. S/p cardiac cath now. Severe multivessel disease. CVTS consulted for possible CABG.  Heparin to resume 8 hours post sheath removal.   Goal of Therapy:  Heparin level 0.3-0.7 units/ml Monitor platelets by anticoagulation protocol: Yes   Plan:  Resume heparin drip 8 hours after sheath pulled, resume heparin at 23:00 tonight at rate  750 units/hr  Heparin level 6 hours after resumed Daily heparin level and CBC  Nicole Cella, RPh Clinical Pharmacist Pager: [phone] 03/22/2016,3:51 PM.

## 2016-03-22 NOTE — ED Notes (Addendum)
PT AT PRESENT DENIES CHEST PAIN

## 2016-03-22 NOTE — Progress Notes (Signed)
ANTICOAGULATION CONSULT NOTE - Initial Consult  Pharmacy Consult for heparin Indication: chest pain/ACS  Allergies  Allergen Reactions  . Vicoprofen [Hydrocodone-Ibuprofen] Rash  . Aspirin Other (See Comments)    GI upset  . Codeine Nausea And Vomiting    Patient Measurements: Height: 5' (152.4 cm) Weight: 147 lb (66.7 kg) IBW/kg (Calculated) : 45.5 Heparin Dosing Weight: 60kg  Vital Signs: Temp: 97.9 F (36.6 C) (10/04 0936) Temp Source: Oral (10/04 0936) BP: 164/92 (10/04 1130) Pulse Rate: 88 (10/04 1130)  Labs:  Recent Labs  03/22/16 1034  HGB 13.3  HCT 40.8  PLT 322  CREATININE 1.30*    Estimated Creatinine Clearance: 28.4 mL/min (by C-G formula based on SCr of 1.3 mg/dL (H)).   Medical History: Past Medical History:  Diagnosis Date  . Cancer ([place])    basal cell cancer on face  . Cervical spine arthritis ([place])   . Depression   . Diabetic retinopathy ([place])   . DJD (degenerative joint disease)   . DM (diabetes mellitus), type 2 ([place])    type 2  . Esophageal stricture   . GERD (gastroesophageal reflux disease)   . History of hiatal hernia   . Hypertension     Medications:   (Not in a hospital admission) Scheduled:  . [START ON 03/23/2016] aspirin EC  81 mg Oral Daily  . atorvastatin  40 mg Oral q1800  . insulin aspart  0-5 Units Subcutaneous QHS  . insulin aspart  0-9 Units Subcutaneous TID WC  . metoprolol tartrate  12.5 mg Oral BID  . [START ON 03/23/2016] multivitamin  1 tablet Oral Daily  . [START ON 03/23/2016] pantoprazole  40 mg Oral Daily  . sodium chloride flush  3 mL Intravenous Q12H   Infusions:  . sodium chloride 250 mL (03/22/16 1221)  . sodium chloride    . nitroGLYCERIN 5 mcg/min (03/22/16 1222)   PRN: sodium chloride, acetaminophen, nitroGLYCERIN, ondansetron (ZOFRAN) IV, oxybutynin, sodium chloride flush, traMADol  Assessment: 82yo F c/o epigastric pain. EKG was abnormal and troponin is elevated. Pharmacy is asked to dose  heparin for ACS/STEMI. Plan is tx to [facility] cath. CBC is wnl, SCr is elevated.  Goal of Therapy:  Heparin level 0.3-0.7 units/ml Monitor platelets by anticoagulation protocol: Yes   Plan:   F/u baseline aPTT and PT/INR.  Give heparin 3500units IV bolus then start infusion at 750units/hr.  Check heparin level in 8hrs.  Check CBC q24h while on heparin.  F/u daily.  Romeo Rabon, PharmD, pager [phone]. 03/22/2016,12:29 PM.

## 2016-03-22 NOTE — ED Provider Notes (Signed)
[facility] DEPT Provider Note   CSN: [id number] Arrival date & time: 03/22/16  9924     History   Chief Complaint Chief Complaint  Patient presents with  . Chest Pain    HPI Andrea Wilkinson is a 82 y.o. female.  82 year old female with past medical history including HTN, T2DM, GERD, esophageal stricture who p/w chest pain. The patient reports at least 1-1/2 months of central chest pain. She states that the pain has been constant for the past month and a half and becomes worse when she eats. She feels like food does not go down and sometimes it comes back up. She also reports worsening chest pain with exertion. Recently, any exertional activity including walking around her house causes her to have chest pain. She endorses associated shortness of breath, occasional diaphoresis, and nausea/vomiting. She denies any cough/cold symptoms or recent illness. She has tried GI medications without relief. No history of bleeding problems and no anticoagulant use. Currently her chest pain is very mild.   The history is provided by the patient.  Chest Pain      Past Medical History:  Diagnosis Date  . Cancer ([place])    basal cell cancer on face  . Cervical spine arthritis ([place])   . Depression   . Diabetic retinopathy ([place])   . DJD (degenerative joint disease)   . DM (diabetes mellitus), type 2 ([place])    type 2  . Esophageal stricture   . GERD (gastroesophageal reflux disease)   . History of hiatal hernia   . Hypertension     Patient Active Problem List   Diagnosis Date Noted  . NSTEMI (non-ST elevated myocardial infarction) ([place]) 03/22/2016  . Acute coronary syndrome ([place]) 03/22/2016  . Chest pain with high risk of acute coronary syndrome 03/22/2016  . Odynophagia 03/01/2016  . GERD (gastroesophageal reflux disease) 01/18/2016  . Mass of soft tissue 01/18/2016  . Diabetic retinopathy of left eye associated with type 2 diabetes mellitus ([place]) 08/10/2015  . Preretinal fibrosis, left  eye 08/02/2015  . Diabetic retinopathy ([place]) 08/02/2015  . OAB (overactive bladder) 05/05/2015  . Routine general medical examination at a health care facility 05/05/2015  . IBS (irritable bowel syndrome) 08/17/2014  . Eczema, allergic 04/07/2014  . Hyperlipidemia with target LDL less than 100 09/24/2013  . Hearing loss 02/27/2012  . Diabetes mellitus type 2 with complications ([place]) 26/83/4196  . Essential hypertension 06/07/2007  . Osteoarthritis 06/07/2007  . Osteoarthritis of spine 06/07/2007    Past Surgical History:  Procedure Laterality Date  . ABDOMINAL HYSTERECTOMY    . AIR/FLUID EXCHANGE Left 08/10/2015   Procedure: AIR/FLUID EXCHANGE, left eye;  Surgeon: Hayden Pedro, MD;  Location: [facility];  Service: Ophthalmology;  Laterality: Left;  . benign tumor removed from left hip    . CATARACT EXTRACTION, BILATERAL  July 2013   Left done on 12/18/11 and right done on 01/15/2012  . CHOLECYSTECTOMY    . DILATION AND CURETTAGE OF UTERUS    . LASER PHOTO ABLATION Left 08/10/2015   Procedure: LASER PHOTO ABLATION, left eye;  Surgeon: Hayden Pedro, MD;  Location: [facility];  Service: Ophthalmology;  Laterality: Left;  . left breast lumpectomy    . MEMBRANE PEEL Left 08/10/2015   Procedure: MEMBRANE PEEL, left eye;  Surgeon: Hayden Pedro, MD;  Location: [facility];  Service: Ophthalmology;  Laterality: Left;  . PARS PLANA VITRECTOMY Left 08/10/2015   Procedure: PARS PLANA VITRECTOMY WITH 25 GAUGE, headscope laser;  Surgeon:  Hayden Pedro, MD;  Location: [facility];  Service: Ophthalmology;  Laterality: Left;  . TOTAL KNEE ARTHROPLASTY     left Jan '12; right May '12 - by Dr. Alvan Dame  . TUBAL LIGATION      OB History    No data available       Home Medications    Prior to Admission medications   Medication Sig Start Date End Date Taking? Authorizing Provider  ACCU-CHEK AVIVA PLUS test strip USE TO CHECK BLOOD SUGAR TWICE DAILY 03/17/15   Janith Lima, MD  beta carotene w/minerals (OCUVITE)  tablet Take 1 tablet by mouth daily.    Historical Provider, MD  Blood Glucose Monitoring Suppl (ACCU-CHEK AVIVA PLUS) W/DEVICE KIT Use to check blood sugars daily 01/07/14   Janith Lima, MD  enalapril (VASOTEC) 20 MG tablet Take 1 tablet (20 mg total) by mouth daily. 08/24/14   Janith Lima, MD  furosemide (LASIX) 20 MG tablet TAKE 1 TABLET (20 MG TOTAL) BY MOUTH DAILY. 03/21/16   Janith Lima, MD  glucose blood (ACCU-CHEK AVIVA PLUS) test strip Use to check blood sugar twice a day Dx 250.00 01/07/14   Janith Lima, MD  glucose blood (ACCU-CHEK COMPACT TEST DRUM) test strip Use as instructed 10/29/12   Biagio Borg, MD  Lancets (ACCU-CHEK MULTICLIX) lancets Use to help check blood sugars twice a day Dx 250.00 01/07/14   Janith Lima, MD  meloxicam (MOBIC) 15 MG tablet TAKE 1 TABLET (15 MG TOTAL) BY MOUTH DAILY. 03/21/16   Janith Lima, MD  metFORMIN (GLUCOPHAGE) 1000 MG tablet Take 1 tablet (1,000 mg total) by mouth 2 (two) times daily with a meal. 07/26/15   Janith Lima, MD  omeprazole (PRILOSEC) 20 MG capsule Take 1 capsule (20 mg total) by mouth daily. 01/18/16   Janith Lima, MD  oxybutynin (DITROPAN-XL) 5 MG 24 hr tablet Take 1 tablet (5 mg total) by mouth daily. Patient taking differently: Take 5 mg by mouth daily as needed (for frequency).  05/05/15   Janith Lima, MD  tiZANidine (ZANAFLEX) 2 MG tablet TAKE 1 TABLET EVERY 6 HOURS AS NEEDED FOR MUSCLE SPASMS Patient taking differently: take 1 tablet by mouth every night at bedtime 03/31/15   Janith Lima, MD  traMADol (ULTRAM) 50 MG tablet Take 1 tablet (50 mg total) by mouth every 6 (six) hours as needed. 11/03/15   Janith Lima, MD    Family History Family History  Problem Relation Age of Onset  . Diabetes Father     Social History Social History  Substance Use Topics  . Smoking status: Never Smoker  . Smokeless tobacco: Never Used  . Alcohol use No     Allergies   Vicoprofen [hydrocodone-ibuprofen]; Aspirin;  and Codeine   Review of Systems Review of Systems  Cardiovascular: Positive for chest pain.   10 Systems reviewed and are negative for acute change except as noted in the HPI.   Physical Exam Updated Vital Signs BP (!) 166/102 (BP Location: Left Arm)   Pulse 97   Temp 97.9 F (36.6 C) (Oral)   Resp 20   SpO2 99%   Physical Exam  Constitutional: She is oriented to person, place, and time. She appears well-developed and well-nourished. No distress.  HENT:  Head: Normocephalic and atraumatic.  Moist mucous membranes  Eyes: Conjunctivae are normal. Pupils are equal, round, and reactive to light.  Neck: Neck supple.  Cardiovascular: Normal rate, regular  rhythm and normal heart sounds.   No murmur heard. Pulmonary/Chest: Effort normal and breath sounds normal.  Abdominal: Soft. Bowel sounds are normal. She exhibits no distension. There is no tenderness.  Musculoskeletal: She exhibits no edema.  Neurological: She is alert and oriented to person, place, and time.  Fluent speech  Skin: Skin is warm and dry.  Psychiatric: She has a normal mood and affect. Judgment normal.  Pleasant  Nursing note and vitals reviewed.    ED Treatments / Results  Labs (all labs ordered are listed, but only abnormal results are displayed) Labs Reviewed  BASIC METABOLIC PANEL - Abnormal; Notable for the following:       Result Value   Chloride 95 (*)    Glucose, Bld 177 (*)    Creatinine, Ser 1.30 (*)    GFR calc non Af Amer 37 (*)    GFR calc Af Amer 43 (*)    All other components within normal limits  CBC - Abnormal; Notable for the following:    WBC 10.7 (*)    All other components within normal limits  I-STAT TROPOININ, ED - Abnormal; Notable for the following:    Troponin i, poc 1.03 (*)    All other components within normal limits    EKG  EKG Interpretation  Date/Time:  Wednesday March 22 2016 11:04:47 EDT Ventricular Rate:  85 PR Interval:    QRS Duration: 84 QT  Interval:  326 QTC Calculation: 388 R Axis:   7 Text Interpretation:  Sinus rhythm Low voltage, precordial leads LVH with secondary repolarization abnormality Anterior Q waves, possibly due to LVH borderline elevation in V2 w/ inversion in inferior leads, concerning for ischemia Confirmed by LITTLE MD, RACHEL [phone]) on 03/22/2016 11:08:58 AM       Radiology Dg Chest 2 View  Result Date: 03/22/2016 CLINICAL DATA:  Mid chest pain for 3 months. EXAM: CHEST  2 VIEW COMPARISON:  06/11/2015 FINDINGS: Heart and mediastinal contours are within normal limits. No focal opacities or effusions. No acute bony abnormality. IMPRESSION: No active cardiopulmonary disease. Electronically Signed   By: Rolm Baptise M.D.   On: 03/22/2016 10:14    Procedures .Critical Care Performed by: Sharlett Iles Authorized by: Sharlett Iles   Critical care provider statement:    Critical care time (minutes):  45   Critical care time was exclusive of:  Separately billable procedures and treating other patients   Critical care was necessary to treat or prevent imminent or life-threatening deterioration of the following conditions:  Cardiac failure   Critical care was time spent personally by me on the following activities:  Development of treatment plan with patient or surrogate, discussions with consultants, examination of patient, obtaining history from patient or surrogate, review of old charts, re-evaluation of patient's condition, ordering and review of radiographic studies, ordering and review of laboratory studies and ordering and performing treatments and interventions   (including critical care time)  Medications Ordered in ED Medications  aspirin chewable tablet 324 mg (324 mg Oral Given 03/22/16 0324)     Initial Impression / Assessment and Plan / ED Course  I have reviewed the triage vital signs and the nursing notes.  Pertinent labs & imaging results that were available during my care of  the patient were reviewed by me and considered in my medical decision making (see chart for details).  Clinical Course   Patient with several months of chest pain that is worse with exertion but also worse after  eating. Labs were sent from triage and patient immediately brought back to room. On my exam she was awake and alert, comfortable, stating that her chest pain was only mild. Vital signs notable for hypertension at 166/102. I immediately contacted  STEMI team and discussed her EKG w/ Dr. Irish Lack, appreciate assistance. Her EKG shows subtle ST elevation in V3 with new ST depression in inferior leads compared to previous, but doesn't quite meet criteria for STEMI alert. Troponin elevated at 1.03, chloride 95, glucose 177, creatinine 1.3, WBC 10.7, normal hemoglobin. Chest x-ray unremarkable and shows no widened mediastinum. After discussion, Dr. Irish Lack decided to hold off on STEMI alert because of the chronic nature of her symptoms. She does endorse some radiation to her back but her symptoms have waxed and waned and her chest x-ray is normal, therefore we doubt aortic dissection. He has recommended ASA, NTG, heparin and we will transfer to [facility] for emergent catherization. I have discussed this and with the patient who is in agreement. Patient will be transferred to [facility].  Final Clinical Impressions(s) / ED Diagnoses   Final diagnoses:  NSTEMI (non-ST elevated myocardial infarction) [facility])    New Prescriptions New Prescriptions   No medications on file     Sharlett Iles, MD 03/22/16 1151

## 2016-03-22 NOTE — H&P (Signed)
Patient ID: VADA SWIFT MRN: [id number], DOB/AGE: [id number]   Admit date: 03/22/2016   Primary Physician: Scarlette Calico, MD Primary Cardiologist: New  HPI: Pleasant 82 y/o Caucasian female, lives in her own home with her son, with a history of DM and HTN. She has no history of CAD or MI, (I did find a [facility] Myoview report from 2000 in [place]). She presents to [facility] ED this am with epigastric pain that woke her up- described as "severe". She denies any nausea, vomiting, or diaphoresis associated with this but she did say it radiated to her back. The pt gives a somewhat confusing history regarding her symptoms prior to this. She says she has had epigastric pain for several weeks. She has been taking antacids with relief but her symptoms recur. She was scheduled to see a gastroenterologist for endoscopy. She also admits that she gets epigastric discomfort with almost any activity. In the ED her EKG shows inferior TWI and borderline ST elevation in V2 concerning for ischemia. Her initial Troponin is 1.03. She is currently pain free, but has had pain since she has been in the ED.    Problem List: Past Medical History:  Diagnosis Date  . Cancer ([place])    basal cell cancer on face  . Cervical spine arthritis ([place])   . Depression   . Diabetic retinopathy ([place])   . DJD (degenerative joint disease)   . DM (diabetes mellitus), type 2 ([place])    type 2  . Esophageal stricture   . GERD (gastroesophageal reflux disease)   . History of hiatal hernia   . Hypertension     Past Surgical History:  Procedure Laterality Date  . ABDOMINAL HYSTERECTOMY    . AIR/FLUID EXCHANGE Left 08/10/2015   Procedure: AIR/FLUID EXCHANGE, left eye;  Surgeon: Hayden Pedro, MD;  Location: [facility];  Service: Ophthalmology;  Laterality: Left;  . benign tumor removed from left hip    . CATARACT EXTRACTION, BILATERAL  July 2013   Left done on 12/18/11 and right done on 01/15/2012  . CHOLECYSTECTOMY    . DILATION AND  CURETTAGE OF UTERUS    . LASER PHOTO ABLATION Left 08/10/2015   Procedure: LASER PHOTO ABLATION, left eye;  Surgeon: Hayden Pedro, MD;  Location: [place];  Service: Ophthalmology;  Laterality: Left;  . left breast lumpectomy    . MEMBRANE PEEL Left 08/10/2015   Procedure: MEMBRANE PEEL, left eye;  Surgeon: Hayden Pedro, MD;  Location: [place];  Service: Ophthalmology;  Laterality: Left;  . PARS PLANA VITRECTOMY Left 08/10/2015   Procedure: PARS PLANA VITRECTOMY WITH 25 GAUGE, headscope laser;  Surgeon: Hayden Pedro, MD;  Location: [place];  Service: Ophthalmology;  Laterality: Left;  . TOTAL KNEE ARTHROPLASTY     left Jan '12; right May '12 - by Dr. Alvan Dame  . TUBAL LIGATION       Allergies:  Allergies  Allergen Reactions  . Vicoprofen [Hydrocodone-Ibuprofen] Rash  . Aspirin Other (See Comments)    GI upset  . Codeine Nausea And Vomiting     Home Medications Prior to Admission medications   Medication Sig Start Date End Date Taking? Authorizing Provider  ACCU-CHEK AVIVA PLUS test strip USE TO CHECK BLOOD SUGAR TWICE DAILY 03/17/15   Janith Lima, MD  beta carotene w/minerals (OCUVITE) tablet Take 1 tablet by mouth daily.    Historical Provider, MD  Blood Glucose Monitoring Suppl (ACCU-CHEK AVIVA PLUS) W/DEVICE KIT Use to check blood  sugars daily 01/07/14   Janith Lima, MD  enalapril (VASOTEC) 20 MG tablet Take 1 tablet (20 mg total) by mouth daily. 08/24/14   Janith Lima, MD  furosemide (LASIX) 20 MG tablet TAKE 1 TABLET (20 MG TOTAL) BY MOUTH DAILY. 03/21/16   Janith Lima, MD  glucose blood (ACCU-CHEK AVIVA PLUS) test strip Use to check blood sugar twice a day Dx 250.00 01/07/14   Janith Lima, MD  glucose blood (ACCU-CHEK COMPACT TEST DRUM) test strip Use as instructed 10/29/12   Biagio Borg, MD  Lancets (ACCU-CHEK MULTICLIX) lancets Use to help check blood sugars twice a day Dx 250.00 01/07/14   Janith Lima, MD  meloxicam (MOBIC) 15 MG tablet TAKE 1 TABLET (15 MG TOTAL)  BY MOUTH DAILY. 03/21/16   Janith Lima, MD  metFORMIN (GLUCOPHAGE) 1000 MG tablet Take 1 tablet (1,000 mg total) by mouth 2 (two) times daily with a meal. 07/26/15   Janith Lima, MD  omeprazole (PRILOSEC) 20 MG capsule Take 1 capsule (20 mg total) by mouth daily. 01/18/16   Janith Lima, MD  oxybutynin (DITROPAN-XL) 5 MG 24 hr tablet Take 1 tablet (5 mg total) by mouth daily. Patient taking differently: Take 5 mg by mouth daily as needed (for frequency).  05/05/15   Janith Lima, MD  tiZANidine (ZANAFLEX) 2 MG tablet TAKE 1 TABLET EVERY 6 HOURS AS NEEDED FOR MUSCLE SPASMS Patient taking differently: take 1 tablet by mouth every night at bedtime 03/31/15   Janith Lima, MD  traMADol (ULTRAM) 50 MG tablet Take 1 tablet (50 mg total) by mouth every 6 (six) hours as needed. 11/03/15   Janith Lima, MD     Family History  Problem Relation Age of Onset  . Diabetes Father      Social History   Social History  . Marital status: Widowed    Spouse name: N/A  . Number of children: N/A  . Years of education: N/A   Occupational History  . Not on file.   Social History Main Topics  . Smoking status: Never Smoker  . Smokeless tobacco: Never Used  . Alcohol use No  . Drug use: No  . Sexual activity: Not Currently   Other Topics Concern  . Not on file   Social History Narrative   Married [phone]   5 sons- '56, '57, '59, '62, '70   Grandchildren 10, 6 great grandchildren   Lives in own home, son lives with her. I- ADLs   Discussed end of life care: would not want resuscitation or being maintained with mechanical ventilation.   Daily caffeine use 1 cup of coffee per day     Review of Systems: General: negative for chills, fever, night sweats or weight changes.  Cardiovascular: negative for edema, orthopnea, palpitations, paroxysmal nocturnal dyspnea or shortness of breath HEENT: negative for any visual disturbances, blindness, glaucoma Dermatological: negative for  rash Respiratory: negative for cough, hemoptysis, or wheezing Urologic: negative for hematuria or dysuria Abdominal: negative for nausea, vomiting, diarrhea, bright red blood per rectum, melena, or hematemesis Neurologic: negative for visual changes, syncope, or dizziness Musculoskeletal: negative for back pain, joint pain, or swelling Psych: cooperative and appropriate All other systems reviewed and are otherwise negative except as noted above.  Physical Exam: Blood pressure (!) 166/102, pulse 97, temperature 97.9 F (36.6 C), temperature source Oral, resp. rate 20, height 5' (1.524 m), weight 147 lb (66.7 kg), SpO2 99 %.  General  appearance: alert, cooperative and no distress Neck: no carotid bruit and no JVD Lungs: clear to auscultation bilaterally Heart: regular rate and rhythm Abdomen: soft, non-tender; bowel sounds normal; no masses,  no organomegaly and RUQ surgical scar Extremities: extremities normal, atraumatic, no cyanosis or edema Pulses: 2+ and symmetric Skin: Skin color, texture, turgor normal. No rashes or lesions Neurologic: Grossly normal    Labs:   Results for orders placed or performed during the hospital encounter of 03/22/16 (from the past 24 hour(s))  Basic metabolic panel     Status: Abnormal   Collection Time: 03/22/16 10:34 AM  Result Value Ref Range   Sodium 135 135 - 145 mmol/L   Potassium 4.3 3.5 - 5.1 mmol/L   Chloride 95 (L) 101 - 111 mmol/L   CO2 26 22 - 32 mmol/L   Glucose, Bld 177 (H) 65 - 99 mg/dL   BUN 19 6 - 20 mg/dL   Creatinine, Ser 1.30 (H) 0.44 - 1.00 mg/dL   Calcium 10.1 8.9 - 10.3 mg/dL   GFR calc non Af Amer 37 (L) >60 mL/min   GFR calc Af Amer 43 (L) >60 mL/min   Anion gap 14 5 - 15  CBC     Status: Abnormal   Collection Time: 03/22/16 10:34 AM  Result Value Ref Range   WBC 10.7 (H) 4.0 - 10.5 K/uL   RBC 4.53 3.87 - 5.11 MIL/uL   Hemoglobin 13.3 12.0 - 15.0 g/dL   HCT 40.8 36.0 - 46.0 %   MCV 90.1 78.0 - 100.0 fL   MCH 29.4  26.0 - 34.0 pg   MCHC 32.6 30.0 - 36.0 g/dL   RDW 13.7 11.5 - 15.5 %   Platelets 322 150 - 400 K/uL  I-stat troponin, ED     Status: Abnormal   Collection Time: 03/22/16 10:43 AM  Result Value Ref Range   Troponin i, poc 1.03 (HH) 0.00 - 0.08 ng/mL   Comment NOTIFIED PHYSICIAN    Comment 3             Radiology/Studies: Dg Chest 2 View  Result Date: 03/22/2016 CLINICAL DATA:  Mid chest pain for 3 months. EXAM: CHEST  2 VIEW COMPARISON:  06/11/2015 FINDINGS: Heart and mediastinal contours are within normal limits. No focal opacities or effusions. No acute bony abnormality. IMPRESSION: No active cardiopulmonary disease. Electronically Signed   By: Rolm Baptise M.D.   On: 03/22/2016 10:14    EKG:NSR, TWI inferior leads, borderline ST elevation V2  ASSESSMENT AND PLAN:  Principal Problem:   Chest pain with high risk of acute coronary syndrome Active Problems:   Diabetes mellitus type 2 with complications (HCC)   Essential hypertension   Diabetic retinopathy (HCC)   GERD (gastroesophageal reflux disease)   NSTEMI (non-ST elevated myocardial infarction) [facility])   PLAN: Discussed with Dr Irish Lack and Dr Rex Kras in the ED. Will start O2, ASA, IV NTG, and Heparin. Transfer to [facility] CCU, cath today.    Angelena Form, PA-C 03/22/2016, 11:43 AM [phone]  I have examined the patient and reviewed assessment and plan and discussed with patient.  Agree with above as stated.  ECG change.  Positive troponin.  I personally reviewed the ECG and made the decision regarding cardiac cath.  Cath Lab Visit (complete for each Cath Lab visit)  Clinical Evaluation Leading to the Procedure:   ACS: Yes.    Non-ACS:    Anginal Classification: CCS IV  Anti-ischemic medical therapy: Minimal Therapy (1 class of  medications)  Non-Invasive Test Results: No non-invasive testing performed  Prior CABG: No previous CABG    Jettie Booze, MD         Larae Grooms

## 2016-03-22 NOTE — ED Notes (Signed)
Dr Darl Householder notified of POS troponin. Patient move to Room 10. NT Wendelyn Breslow is doing repeat EKG per Dr Darl Householder.

## 2016-03-22 NOTE — ED Notes (Addendum)
CARDIC PA PRESENT at bedside.

## 2016-03-23 ENCOUNTER — Other Ambulatory Visit (HOSPITAL_COMMUNITY): Payer: Self-pay | Admitting: Radiology

## 2016-03-23 ENCOUNTER — Encounter (HOSPITAL_COMMUNITY): Payer: Self-pay | Admitting: Interventional Cardiology

## 2016-03-23 ENCOUNTER — Inpatient Hospital Stay (HOSPITAL_COMMUNITY): Payer: Commercial Managed Care - HMO

## 2016-03-23 DIAGNOSIS — R9431 Abnormal electrocardiogram [ECG] [EKG]: Secondary | ICD-10-CM

## 2016-03-23 DIAGNOSIS — N183 Chronic kidney disease, stage 3 unspecified: Secondary | ICD-10-CM | POA: Diagnosis present

## 2016-03-23 DIAGNOSIS — I255 Ischemic cardiomyopathy: Secondary | ICD-10-CM

## 2016-03-23 DIAGNOSIS — Z01810 Encounter for preprocedural cardiovascular examination: Secondary | ICD-10-CM

## 2016-03-23 LAB — URINALYSIS, ROUTINE W REFLEX MICROSCOPIC
Bilirubin Urine: NEGATIVE
Glucose, UA: NEGATIVE mg/dL
Hgb urine dipstick: NEGATIVE
Ketones, ur: NEGATIVE mg/dL
Nitrite: NEGATIVE
Protein, ur: NEGATIVE mg/dL
Specific Gravity, Urine: <1.005 — ABNORMAL LOW (ref 1.005–1.030)
pH: 6 (ref 5.0–8.0)

## 2016-03-23 LAB — SPIROMETRY WITH GRAPH
FEF 25-75 Post: 1.35 L/sec
FEF 25-75 Pre: 1.08 L/sec
FEF2575-%Change-Post: 25 %
FEF2575-%Pred-Post: 127 %
FEF2575-%Pred-Pre: 101 %
FEV1-%Change-Post: 9 %
FEV1-%Pred-Post: 91 %
FEV1-%Pred-Pre: 84 %
FEV1-Post: 1.37 L
FEV1-Pre: 1.25 L
FEV1FVC-%Change-Post: 7 %
FEV1FVC-%Pred-Pre: 102 %
FEV6-%Change-Post: 1 %
FEV6-%Pred-Post: 89 %
FEV6-%Pred-Pre: 87 %
FEV6-Post: 1.7 L
FEV6-Pre: 1.67 L
FEV6FVC-%Pred-Post: 106 %
FEV6FVC-%Pred-Pre: 106 %
FVC-%Change-Post: 1 %
FVC-%Pred-Post: 83 %
FVC-%Pred-Pre: 82 %
FVC-Post: 1.7 L
FVC-Pre: 1.67 L
Post FEV1/FVC ratio: 81 %
Post FEV6/FVC ratio: 100 %
Pre FEV1/FVC ratio: 75 %
Pre FEV6/FVC Ratio: 100 %

## 2016-03-23 LAB — CBC
HCT: 37.7 % (ref 36.0–46.0)
HEMOGLOBIN: 11.5 g/dL — AB (ref 12.0–15.0)
MCH: 28.3 pg (ref 26.0–34.0)
MCHC: 30.5 g/dL (ref 30.0–36.0)
MCV: 92.9 fL (ref 78.0–100.0)
Platelets: 276 10*3/uL (ref 150–400)
RBC: 4.06 MIL/uL (ref 3.87–5.11)
RDW: 13.9 % (ref 11.5–15.5)
WBC: 7.8 10*3/uL (ref 4.0–10.5)

## 2016-03-23 LAB — COMPREHENSIVE METABOLIC PANEL
ALT: 19 U/L (ref 14–54)
AST: 40 U/L (ref 15–41)
Albumin: 3.7 g/dL (ref 3.5–5.0)
Alkaline Phosphatase: 57 U/L (ref 38–126)
Anion gap: 13 (ref 5–15)
BUN: 12 mg/dL (ref 6–20)
CO2: 27 mmol/L (ref 22–32)
Calcium: 9.5 mg/dL (ref 8.9–10.3)
Chloride: 99 mmol/L — ABNORMAL LOW (ref 101–111)
Creatinine, Ser: 1.21 mg/dL — ABNORMAL HIGH (ref 0.44–1.00)
GFR calc Af Amer: 47 mL/min — ABNORMAL LOW (ref >60)
GFR calc non Af Amer: 41 mL/min — ABNORMAL LOW (ref >60)
Glucose, Bld: 152 mg/dL — ABNORMAL HIGH (ref 65–99)
Potassium: 4.1 mmol/L (ref 3.5–5.1)
Sodium: 139 mmol/L (ref 135–145)
Total Bilirubin: 0.6 mg/dL (ref 0.3–1.2)
Total Protein: 6 g/dL — ABNORMAL LOW (ref 6.5–8.1)

## 2016-03-23 LAB — BLOOD GAS, ARTERIAL
Acid-Base Excess: 1.6 mmol/L (ref 0.0–2.0)
Bicarbonate: 25.3 mmol/L (ref 20.0–28.0)
Drawn by: 252031
O2 Saturation: 97.1 %
Patient temperature: 98.6
pCO2 arterial: 37.4 mmHg (ref 32.0–48.0)
pH, Arterial: 7.445 (ref 7.350–7.450)
pO2, Arterial: 87 mmHg (ref 83.0–108.0)

## 2016-03-23 LAB — VAS US DOPPLER PRE CABG
LCCAPSYS: 94 cm/s
LEFT ECA DIAS: -16 cm/s
LEFT VERTEBRAL DIAS: 11 cm/s
LICADDIAS: -16 cm/s
LICAPSYS: -57 cm/s
Left CCA dist dias: 12 cm/s
Left CCA dist sys: 68 cm/s
Left CCA prox dias: 17 cm/s
Left ICA dist sys: -56 cm/s
Left ICA prox dias: -18 cm/s
RCCAPDIAS: 21 cm/s
RIGHT ECA DIAS: -11 cm/s
RIGHT VERTEBRAL DIAS: 7 cm/s
Right CCA prox sys: 88 cm/s
Right cca dist sys: -43 cm/s

## 2016-03-23 LAB — HEPARIN LEVEL (UNFRACTIONATED)
HEPARIN UNFRACTIONATED: 0.42 [IU]/mL (ref 0.30–0.70)
Heparin Unfractionated: 0.17 IU/mL — ABNORMAL LOW (ref 0.30–0.70)
Heparin Unfractionated: 0.49 IU/mL (ref 0.30–0.70)

## 2016-03-23 LAB — ECHOCARDIOGRAM COMPLETE
HEIGHTINCHES: 60 in
Weight: 2359.8 oz

## 2016-03-23 LAB — GLUCOSE, CAPILLARY
GLUCOSE-CAPILLARY: 137 mg/dL — AB (ref 65–99)
GLUCOSE-CAPILLARY: 162 mg/dL — AB (ref 65–99)
GLUCOSE-CAPILLARY: 165 mg/dL — AB (ref 65–99)
GLUCOSE-CAPILLARY: 169 mg/dL — AB (ref 65–99)
Glucose-Capillary: 176 mg/dL — ABNORMAL HIGH (ref 65–99)

## 2016-03-23 LAB — URINE MICROSCOPIC-ADD ON

## 2016-03-23 LAB — TROPONIN I: Troponin I: 4.85 ng/mL (ref <0.03)

## 2016-03-23 LAB — ABO/RH: ABO/RH(D): A POS

## 2016-03-23 LAB — PROTIME-INR
INR: 1.07
Prothrombin Time: 13.9 seconds (ref 11.4–15.2)

## 2016-03-23 MED ORDER — TEMAZEPAM 15 MG PO CAPS
15.0000 mg | ORAL_CAPSULE | Freq: Once | ORAL | Status: AC | PRN
  Administered 2016-03-23: 15 mg via ORAL
  Filled 2016-03-23: qty 1

## 2016-03-23 MED ORDER — NITROGLYCERIN IN D5W 200-5 MCG/ML-% IV SOLN
2.0000 ug/min | INTRAVENOUS | Status: DC
  Filled 2016-03-23: qty 250

## 2016-03-23 MED ORDER — VANCOMYCIN HCL 10 G IV SOLR
1250.0000 mg | INTRAVENOUS | Status: AC
  Administered 2016-03-24: 1250 mg via INTRAVENOUS
  Filled 2016-03-23 (×2): qty 1250

## 2016-03-23 MED ORDER — TRANEXAMIC ACID (OHS) BOLUS VIA INFUSION
15.0000 mg/kg | INTRAVENOUS | Status: DC
  Administered 2016-03-24: 1003.5 mg via INTRAVENOUS
  Filled 2016-03-23: qty 1004

## 2016-03-23 MED ORDER — BISACODYL 5 MG PO TBEC
5.0000 mg | DELAYED_RELEASE_TABLET | Freq: Once | ORAL | Status: AC
  Administered 2016-03-23: 5 mg via ORAL
  Filled 2016-03-23: qty 1

## 2016-03-23 MED ORDER — DOPAMINE-DEXTROSE 3.2-5 MG/ML-% IV SOLN
0.0000 ug/kg/min | INTRAVENOUS | Status: DC
  Filled 2016-03-23 (×2): qty 250

## 2016-03-23 MED ORDER — SODIUM CHLORIDE 0.9 % IV SOLN
INTRAVENOUS | Status: DC
  Administered 2016-03-24: 2.1 [IU]/h via INTRAVENOUS
  Filled 2016-03-23: qty 2.5

## 2016-03-23 MED ORDER — ALBUTEROL SULFATE (2.5 MG/3ML) 0.083% IN NEBU
2.5000 mg | INHALATION_SOLUTION | Freq: Once | RESPIRATORY_TRACT | Status: AC
  Administered 2016-03-23: 2.5 mg via RESPIRATORY_TRACT

## 2016-03-23 MED ORDER — CHLORHEXIDINE GLUCONATE 0.12 % MT SOLN
15.0000 mL | Freq: Once | OROMUCOSAL | Status: AC
  Administered 2016-03-24: 15 mL via OROMUCOSAL
  Filled 2016-03-23: qty 15

## 2016-03-23 MED ORDER — PHENYLEPHRINE HCL 10 MG/ML IJ SOLN
30.0000 ug/min | INTRAVENOUS | Status: DC
  Administered 2016-03-24: 25 ug/min via INTRAVENOUS
  Filled 2016-03-23: qty 2

## 2016-03-23 MED ORDER — CHLORHEXIDINE GLUCONATE CLOTH 2 % EX PADS
6.0000 | MEDICATED_PAD | Freq: Once | CUTANEOUS | Status: AC
  Administered 2016-03-24: 6 via TOPICAL

## 2016-03-23 MED ORDER — METOPROLOL TARTRATE 12.5 MG HALF TABLET
12.5000 mg | ORAL_TABLET | Freq: Once | ORAL | Status: AC
Start: 2016-03-24 — End: 2016-03-24
  Administered 2016-03-24: 12.5 mg via ORAL
  Filled 2016-03-23: qty 1

## 2016-03-23 MED ORDER — DEXTROSE 5 % IV SOLN
750.0000 mg | INTRAVENOUS | Status: DC
  Filled 2016-03-23 (×2): qty 750

## 2016-03-23 MED ORDER — DEXMEDETOMIDINE HCL IN NACL 400 MCG/100ML IV SOLN
0.1000 ug/kg/h | INTRAVENOUS | Status: DC
  Filled 2016-03-23: qty 100

## 2016-03-23 MED ORDER — TRANEXAMIC ACID (OHS) PUMP PRIME SOLUTION
2.0000 mg/kg | INTRAVENOUS | Status: DC
  Filled 2016-03-23: qty 1.34

## 2016-03-23 MED ORDER — TRANEXAMIC ACID 1000 MG/10ML IV SOLN
1.5000 mg/kg/h | INTRAVENOUS | Status: DC
  Administered 2016-03-24: 1.5 mg/kg/h via INTRAVENOUS
  Filled 2016-03-23: qty 25

## 2016-03-23 MED ORDER — CHLORHEXIDINE GLUCONATE CLOTH 2 % EX PADS
6.0000 | MEDICATED_PAD | Freq: Once | CUTANEOUS | Status: AC
  Administered 2016-03-23: 6 via TOPICAL

## 2016-03-23 MED ORDER — DEXTROSE 5 % IV SOLN
1.5000 g | INTRAVENOUS | Status: DC
Start: 2016-03-24 — End: 2016-03-24
  Administered 2016-03-24: 1.5 g via INTRAVENOUS
  Administered 2016-03-24: .75 g via INTRAVENOUS
  Filled 2016-03-23: qty 1.5

## 2016-03-23 MED ORDER — SODIUM CHLORIDE 0.9 % IV SOLN
INTRAVENOUS | Status: DC
  Filled 2016-03-23: qty 30

## 2016-03-23 MED ORDER — MAGNESIUM SULFATE 50 % IJ SOLN
40.0000 meq | INTRAMUSCULAR | Status: DC
  Filled 2016-03-23: qty 10

## 2016-03-23 MED ORDER — DEXTROSE 5 % IV SOLN
0.0000 ug/min | INTRAVENOUS | Status: DC
  Filled 2016-03-23: qty 4

## 2016-03-23 MED ORDER — PLASMA-LYTE 148 IV SOLN
INTRAVENOUS | Status: DC
  Filled 2016-03-23: qty 2.5

## 2016-03-23 MED ORDER — METOPROLOL TARTRATE 25 MG PO TABS
25.0000 mg | ORAL_TABLET | Freq: Two times a day (BID) | ORAL | Status: DC
  Administered 2016-03-23 (×2): 25 mg via ORAL
  Filled 2016-03-23 (×2): qty 1

## 2016-03-23 MED ORDER — POTASSIUM CHLORIDE 2 MEQ/ML IV SOLN
80.0000 meq | INTRAVENOUS | Status: DC
  Filled 2016-03-23: qty 40

## 2016-03-23 NOTE — Progress Notes (Signed)
TELEMETRY: Reviewed telemetry pt in NSR: Vitals:   03/23/16 0400 03/23/16 0452 03/23/16 0500 03/23/16 0600  BP: 118/79  140/68 (!) 148/60  Pulse: 63  65 68  Resp: 15  14 11   Temp:      TempSrc:      SpO2: 96%  98% 98%  Weight:  147 lb 7.8 oz (66.9 kg)    Height:        Intake/Output Summary (Last 24 hours) at 03/23/16 0733 Last data filed at 03/23/16 0600  Gross per 24 hour  Intake           1649.7 ml  Output              825 ml  Net            824.7 ml   Filed Weights   03/22/16 1141 03/22/16 1530 03/23/16 0452  Weight: 147 lb (66.7 kg) 141 lb 1.5 oz (64 kg) 147 lb 7.8 oz (66.9 kg)    Subjective  Feels well this am. Only slight chest discomfort last night.   Marland Kitchen aspirin EC  81 mg Oral Daily  . atorvastatin  40 mg Oral q1800  . insulin aspart  0-5 Units Subcutaneous QHS  . insulin aspart  0-9 Units Subcutaneous TID WC  . metoprolol tartrate  12.5 mg Oral BID  . multivitamin  1 tablet Oral Daily  . pantoprazole  40 mg Oral Daily  . sodium chloride flush  3 mL Intravenous Q12H  . sodium chloride flush  3 mL Intravenous Q12H   . sodium chloride 75 mL/hr at 03/22/16 2302  . sodium chloride 75 mL/hr at 03/23/16 0600  . heparin 950 Units/hr (03/23/16 [id number])  . nitroGLYCERIN 5 mcg/min (03/23/16 0600)    LABS: Basic Metabolic Panel:  Recent Labs  03/22/16 1034 03/23/16 0448  NA 135 139  K 4.3 4.1  CL 95* 99*  CO2 26 27  GLUCOSE 177* 152*  BUN 19 12  CREATININE 1.30* 1.21*  CALCIUM 10.1 9.5   Liver Function Tests:  Recent Labs  03/23/16 0448  AST 40  ALT 19  ALKPHOS 57  BILITOT 0.6  PROT 6.0*  ALBUMIN 3.7   No results for input(s): LIPASE, AMYLASE in the last 72 hours. CBC:  Recent Labs  03/22/16 1034 03/23/16 0448  WBC 10.7* 7.8  HGB 13.3 11.5*  HCT 40.8 37.7  MCV 90.1 92.9  PLT 322 276   Cardiac Enzymes:  Recent Labs  03/22/16 1232 03/23/16 0044  TROPONINI 1.31* 4.85*   BNP: No results for input(s): PROBNP in the last 72  hours. D-Dimer: No results for input(s): DDIMER in the last 72 hours. Hemoglobin A1C: No results for input(s): HGBA1C in the last 72 hours. Fasting Lipid Panel: No results for input(s): CHOL, HDL, LDLCALC, TRIG, CHOLHDL, LDLDIRECT in the last 72 hours. Thyroid Function Tests: No results for input(s): TSH, T4TOTAL, T3FREE, THYROIDAB in the last 72 hours.  Invalid input(s): FREET3   Radiology/Studies:  Dg Chest 2 View  Result Date: 03/22/2016 CLINICAL DATA:  Mid chest pain for 3 months. EXAM: CHEST  2 VIEW COMPARISON:  06/11/2015 FINDINGS: Heart and mediastinal contours are within normal limits. No focal opacities or effusions. No acute bony abnormality. IMPRESSION: No active cardiopulmonary disease. Electronically Signed   By: Rolm Baptise M.D.   On: 03/22/2016 10:14   Ecg today shows NSR with LVH and anterolateral T wave inversion.  Procedures   Left Heart Cath and Coronary Angiography  Conclusion  Prox RCA to Mid RCA lesion, 70 %stenosed.  Prox Cx to Mid Cx lesion, 80 %stenosed.  1st Mrg lesion, 80 %stenosed.  Lat 1st Mrg lesion, 80 %stenosed.  Dist Cx lesion, 50 %stenosed.  Prox LAD lesion, 90 %stenosed.  Mid LAD to Dist LAD lesion, 90 %stenosed.  1st Diag lesion, 99 %stenosed.  The left ventricular ejection fraction is 35-45% by visual estimate.  There is mild to moderate left ventricular systolic dysfunction.  LV end diastolic pressure is normal.  There is no aortic valve stenosis.   Severe multivessel disease.  Restart heparin in 8 hours.  CVTS consult for possible CABG.    Indications   NSTEMI (non-ST elevated myocardial infarction) ([place]) [I21.4 (ICD-10-CM)]  Procedural Details/Technique   Technical Details The risks, benefits, and details of the procedure were explained to the patient. The patient verbalized understanding and wanted to proceed. Informed written consent was obtained.  PROCEDURE TECHNIQUE: After Xylocaine anesthesia a 24F slender  sheath was placed in the right radial artery with a single anterior needle wall stick. IV Heparin was given. Right coronary angiography was done using a Judkins R4 guide catheter. Left coronary angiography was done using a Judkins L3.5 guide catheter. Left ventriculography was done using a JR4 catheter. A TR band was used for hemostasis.  Contrast: 55 cc    Estimated blood loss <50 mL.  During this procedure the patient was administered the following to achieve and maintain moderate conscious sedation: Versed 2 mg, Fentanyl 50 mcg, while the patient's heart rate, blood pressure, and oxygen saturation were continuously monitored. The period of conscious sedation was 27 minutes, of which I was present face-to-face 100% of this time.    Coronary Findings   Dominance: Left  Left Anterior Descending  Prox LAD lesion, 90% stenosed. The lesion is ulcerative.  Mid LAD to Dist LAD lesion, 90% stenosed.  First Diagonal Branch  1st Diag lesion, 99% stenosed.  Left Circumflex  Prox Cx to Mid Cx lesion, 80% stenosed.  Dist Cx lesion, 50% stenosed.  First Obtuse Marginal Branch  Vessel is large in size.  1st Mrg lesion, 80% stenosed.  Lateral First Obtuse Marginal Branch  Lat 1st Mrg lesion, 80% stenosed.  Right Coronary Artery  Prox RCA to Mid RCA lesion, 70% stenosed.  Wall Motion              Left Heart   Left Ventricle The left ventricular size is normal. There is mild to moderate left ventricular systolic dysfunction. LV end diastolic pressure is normal. The left ventricular ejection fraction is 35-45% by visual estimate. There are LV function abnormalities due to segmental dysfunction.    Aortic Valve There is no aortic valve stenosis.    Coronary Diagrams   Diagnostic Diagram       PHYSICAL EXAM General: Well developed, well nourished, in no acute distress. Head: Normocephalic, atraumatic, sclera non-icteric, oropharynx is clear Neck: Negative for carotid bruits. JVD not  elevated. No adenopathy Lungs: Clear bilaterally to auscultation without wheezes, rales, or rhonchi. Breathing is unlabored. Heart: RRR S1 S2 without murmurs, rubs, or gallops.  Abdomen: Soft, non-tender, non-distended with normoactive bowel sounds. No hepatomegaly. No rebound/guarding. No obvious abdominal masses. Msk:  Strength and tone appears normal for age. Extremities: No clubbing, cyanosis or edema.  Distal pedal pulses are 2+ and equal bilaterally. Right radial site without hematoma. Neuro: Alert and oriented X 3. Moves all extremities spontaneously. Psych:  Responds to questions appropriately with a normal affect.  ASSESSMENT AND  PLAN: 1. NSTEMI with critical 3 vessel obstructive CAD. Peak troponin 4.85. Currently pain free on IV heparin and Ntg. Continue ASA, statin, Beta blocker. Echo today. LE and carotid dopplers today. Plan CABG tomorrow.  2. CKD stage 3. Creatinine stable 1.3>>1.2 today. 3. DM with retinopathy and probable neuropathy (chronic leg numbness). On SSI 4. Ischemic cardiomyopathy with mod-severe LV dysfunction. Echo pending. Continue beta blocker. Hold ACEi pre op to avoid renal impairment. Will add ACEi post op. EDP normal at cath 12 mm Hg. 5. HTN  Present on Admission: . NSTEMI (non-ST elevated myocardial infarction) ([place]) . Chest pain with high risk of acute coronary syndrome . Diabetes mellitus type 2 with complications ([place]) . Diabetic retinopathy ([place]) . Essential hypertension . GERD (gastroesophageal reflux disease) . Unstable angina ([place])   Signed, Andrea Wilkinson, [place] 03/23/2016 7:33 AM

## 2016-03-23 NOTE — Progress Notes (Signed)
ANTICOAGULATION CONSULT NOTE - Follow Up Consult  Pharmacy Consult for heparin Indication: chest pain/ACS  Allergies  Allergen Reactions  . Vicoprofen [Hydrocodone-Ibuprofen] Rash  . Aspirin Other (See Comments)    GI upset  . Codeine Nausea And Vomiting    Patient Measurements: Height: 5' (152.4 cm) Weight: 147 lb 7.8 oz (66.9 kg) IBW/kg (Calculated) : 45.5 Heparin Dosing Weight: 66.9 kg  Vital Signs: Temp: 98.4 F (36.9 C) (10/05 2100) Temp Source: Oral (10/05 2100) BP: 157/84 (10/05 2216) Pulse Rate: 70 (10/05 2216)  Labs:  Recent Labs  03/22/16 1034 03/22/16 1232 03/23/16 0044 03/23/16 0448 03/23/16 1404 03/23/16 2159  HGB 13.3  --   --  11.5*  --   --   HCT 40.8  --   --  37.7  --   --   PLT 322  --   --  276  --   --   APTT  --  32  --   --   --   --   LABPROT  --  13.6  --  13.9  --   --   INR  --  1.04  --  1.07  --   --   HEPARINUNFRC  --   --   --  0.17* 0.42 0.49  CREATININE 1.30*  --   --  1.21*  --   --   TROPONINI  --  1.31* 4.85*  --   --   --     Estimated Creatinine Clearance: 30.6 mL/min (by C-G formula based on SCr of 1.21 mg/dL (H)).   Medications:  Scheduled:  . aspirin EC  81 mg Oral Daily  . atorvastatin  40 mg Oral q1800  . [START ON 03/24/2016] cefUROXime (ZINACEF)  IV  1.5 g Intravenous To OR  . [START ON 03/24/2016] cefUROXime (ZINACEF)  IV  750 mg Intravenous To OR  . [START ON 03/24/2016] chlorhexidine  15 mL Mouth/Throat Once  . [START ON 03/24/2016] Chlorhexidine Gluconate Cloth  6 each Topical Once  . [START ON 03/24/2016] dexmedetomidine  0.1-0.7 mcg/kg/hr Intravenous To OR  . [START ON 03/24/2016] DOPamine  0-10 mcg/kg/min Intravenous To OR  . [START ON 03/24/2016] epinephrine  0-10 mcg/min Intravenous To OR  . [START ON 03/24/2016] heparin-papaverine-plasmalyte irrigation   Irrigation To OR  . [START ON 03/24/2016] heparin 30,000 units/NS 1000 mL solution for CELLSAVER   Other To OR  . insulin aspart  0-5 Units Subcutaneous QHS   . insulin aspart  0-9 Units Subcutaneous TID WC  . [START ON 03/24/2016] insulin (NOVOLIN-R) infusion   Intravenous To OR  . [START ON 03/24/2016] magnesium sulfate  40 mEq Other To OR  . [START ON 03/24/2016] metoprolol tartrate  12.5 mg Oral Once  . metoprolol tartrate  25 mg Oral BID  . multivitamin  1 tablet Oral Daily  . [START ON 03/24/2016] nitroGLYCERIN  2-200 mcg/min Intravenous To OR  . pantoprazole  40 mg Oral Daily  . [START ON 03/24/2016] phenylephrine (NEO-SYNEPHRINE) Adult infusion  30-200 mcg/min Intravenous To OR  . [START ON 03/24/2016] potassium chloride  80 mEq Other To OR  . sodium chloride flush  3 mL Intravenous Q12H  . sodium chloride flush  3 mL Intravenous Q12H  . [START ON 03/24/2016] tranexamic acid (CYKLOKAPRON) infusion ([facility])  1.5 mg/kg/hr Intravenous To OR  . [START ON 03/24/2016] tranexamic acid  15 mg/kg Intravenous To OR  . [START ON 03/24/2016] tranexamic acid  2 mg/kg Intracatheter To OR  . [  START ON 03/24/2016] vancomycin  1,250 mg Intravenous To OR   Infusions:  . heparin 950 Units/hr (03/23/16 2219)  . nitroGLYCERIN 5 mcg/min (03/23/16 2000)    Assessment: 82 yo female with ACS is currently on therapeutic heparin.  Heparin level is 0.49.  Goal of Therapy:  Heparin level 0.3-0.7 units/ml Monitor platelets by anticoagulation protocol: Yes   Plan:  - continue heparin at 950 units/hr - CABG tom  Kimberley Speece, Tsz-Yin 03/23/2016,10:39 PM

## 2016-03-23 NOTE — Progress Notes (Signed)
CARDIAC REHAB PHASE I   Pt for CABG in am, hold ambulation today per RN. Cardiac surgery pre-op education completed with pt at bedside. Reviewed IS, sternal precautions, activity progression, cardiac surgery booklet and cardiac surgery guidelines. Advised pt to request assistance from RN to view cardiac surgery videos if interested (pt in ICU, does not have phone at bedside). Pt verbalized understanding. Pt in bed, call bell within reach, will follow post-op.   [id number] Lenna Sciara, RN, BSN 03/23/2016 10:01 AM

## 2016-03-23 NOTE — Progress Notes (Signed)
ANTICOAGULATION CONSULT NOTE - Follow up  Pharmacy Consult for heparin Indication: chest pain/ACS  Allergies  Allergen Reactions  . Vicoprofen [Hydrocodone-Ibuprofen] Rash  . Aspirin Other (See Comments)    GI upset  . Codeine Nausea And Vomiting    Patient Measurements: Height: 5' (152.4 cm) Weight: 147 lb 7.8 oz (66.9 kg) IBW/kg (Calculated) : 45.5 Heparin Dosing Weight: 60kg  Vital Signs: Temp: 98.5 F (36.9 C) (10/05 0000) Temp Source: Oral (10/05 0000) BP: 140/68 (10/05 0500) Pulse Rate: 65 (10/05 0500)  Labs:  Recent Labs  03/22/16 1034 03/22/16 1232 03/23/16 0044 03/23/16 0448  HGB 13.3  --   --  11.5*  HCT 40.8  --   --  37.7  PLT 322  --   --  276  APTT  --  32  --   --   LABPROT  --  13.6  --  13.9  INR  --  1.04  --  1.07  HEPARINUNFRC  --   --   --  0.17*  CREATININE 1.30*  --   --   --   TROPONINI  --  1.31* 4.85*  --     Estimated Creatinine Clearance: 28.5 mL/min (by C-G formula based on SCr of 1.3 mg/dL (H)).  Assessment: 82yo F on heparin IV infusion per pharmacy for severe multivessel disease. Plan for CABG on Friday. Heparin level subtherapeutic (0.17) on gtt at 750 units/hr. Hgb down to 11.5, plt down to 276. No issues with line or bleeding reported per RN.   Goal of Therapy:  Heparin level 0.3-0.7 units/ml Monitor platelets by anticoagulation protocol: Yes   Plan:  Increase heparin drip to 950 units/hr  F/u heparin level in 8 hours  Sherlon Handing, PharmD, BCPS Clinical pharmacist, pager [phone] 03/23/2016,5:53 AM.

## 2016-03-23 NOTE — Progress Notes (Addendum)
ANTICOAGULATION CONSULT NOTE - Follow up  Pharmacy Consult for heparin Indication: chest pain/ACS  Allergies  Allergen Reactions  . Vicoprofen [Hydrocodone-Ibuprofen] Rash  . Aspirin Other (See Comments)    GI upset  . Codeine Nausea And Vomiting    Patient Measurements: Height: 5' (152.4 cm) Weight: 147 lb 7.8 oz (66.9 kg) IBW/kg (Calculated) : 45.5 Heparin Dosing Weight: 60kg  Vital Signs: Temp: 97.9 F (36.6 C) (10/05 1200) Temp Source: Oral (10/05 1200) BP: 159/89 (10/05 1400) Pulse Rate: 83 (10/05 1400)  Labs:  Recent Labs  03/22/16 1034 03/22/16 1232 03/23/16 0044 03/23/16 0448 03/23/16 1404  HGB 13.3  --   --  11.5*  --   HCT 40.8  --   --  37.7  --   PLT 322  --   --  276  --   APTT  --  32  --   --   --   LABPROT  --  13.6  --  13.9  --   INR  --  1.04  --  1.07  --   HEPARINUNFRC  --   --   --  0.17* 0.42  CREATININE 1.30*  --   --  1.21*  --   TROPONINI  --  1.31* 4.85*  --   --     Estimated Creatinine Clearance: 30.6 mL/min (by C-G formula based on SCr of 1.21 mg/dL (H)).  Assessment: 82yo F on heparin IV infusion per pharmacy for severe multivessel disease. Plan for CABG on Friday. Not on anticoagulation PTA. Heparin now therapeutic (0.42) on gtt at 950 units/hr. Hgb down to 11.5, plt down to 276. No issues with line or bleeding reported per RN.   Goal of Therapy:  Heparin level 0.3-0.7 units/ml Monitor platelets by anticoagulation protocol: Yes   Plan:  Heparin drip at 950 units/hr  F/u heparin level in 8 hours to confirm Daily heparin level/CBC Monitor for s/sx bleeding CABG for 10/6   Elicia Lamp, PharmD, BCPS Clinical Pharmacist 03/23/2016 3:14 PM

## 2016-03-23 NOTE — Progress Notes (Signed)
  Echocardiogram 2D Echocardiogram has been performed.  Andrea Wilkinson M 03/23/2016, 9:12 AM

## 2016-03-23 NOTE — Progress Notes (Signed)
Pre-op Cardiac Surgery  Carotid Findings:  Bilateral: No significant (1-39%) ICA stenosis. Antegrade vertebral flow. \   Upper Extremity Right Left  Brachial Pressures 143 157  Radial Waveforms Tri Tri  Ulnar Waveforms Tri Tri  Palmar Arch (Allen's Test) Normal Obliterates with radial compression, decreases >50% with ulnar compression     Lower  Extremity Right Left  Dorsalis Pedis    Anterior Tibial 81, mono 155, Tri  Posterior Tibial 121, mono 100, mono  Ankle/Brachial Indices 0.77 0.99    Findings:  Right ABI indicates moderate arterial disease. Left ABI is within normal limits, but may be falsely elevated.    Landry Mellow, RDMS, RVT 03/23/2016

## 2016-03-23 NOTE — Progress Notes (Signed)
Patient ID: Andrea Wilkinson, female   DOB: 1931/12/07, 82 y.o.   MRN: [id number] TCTS DAILY ICU PROGRESS NOTE                   [address]            [place] [address]          [phone]   1 Day Post-Op Procedure(s) (LRB): Left Heart Cath and Coronary Angiography (N/A)  Total Length of Stay:  LOS: 1 day   Subjective: Stable day, no chest pain today  Objective: Vital signs in last 24 hours: Temp:  [97.7 F (36.5 C)-98.6 F (37 C)] 98.6 F (37 C) (10/05 1600) Pulse Rate:  [63-93] 80 (10/05 1700) Cardiac Rhythm: Normal sinus rhythm (10/05 1600) Resp:  [10-27] 16 (10/05 1700) BP: (102-163)/(50-118) 158/86 (10/05 1700) SpO2:  [90 %-98 %] 97 % (10/05 1700) Weight:  [147 lb 7.8 oz (66.9 kg)] 147 lb 7.8 oz (66.9 kg) (10/05 0452)  Filed Weights   03/22/16 1141 03/22/16 1530 03/23/16 0452  Weight: 147 lb (66.7 kg) 141 lb 1.5 oz (64 kg) 147 lb 7.8 oz (66.9 kg)    Weight change:    Hemodynamic parameters for last 24 hours:    Intake/Output from previous day: 10/04 0701 - 10/05 0700 In: 1735.7 [P.O.:240; I.V.:1495.7] Out: 825 [Urine:825]  Intake/Output this shift: Total I/O In: 830 [P.O.:720; I.V.:110] Out: 850 [Urine:850]  Current Meds: Scheduled Meds: . aspirin EC  81 mg Oral Daily  . atorvastatin  40 mg Oral q1800  . [START ON 03/24/2016] cefUROXime (ZINACEF)  IV  1.5 g Intravenous To OR  . [START ON 03/24/2016] cefUROXime (ZINACEF)  IV  750 mg Intravenous To OR  . [START ON 03/24/2016] dexmedetomidine  0.1-0.7 mcg/kg/hr Intravenous To OR  . [START ON 03/24/2016] DOPamine  0-10 mcg/kg/min Intravenous To OR  . [START ON 03/24/2016] epinephrine  0-10 mcg/min Intravenous To OR  . [START ON 03/24/2016] heparin-papaverine-plasmalyte irrigation   Irrigation To OR  . [START ON 03/24/2016] heparin 30,000 units/NS 1000 mL solution for CELLSAVER   Other To OR  . insulin aspart  0-5 Units Subcutaneous QHS  . insulin aspart  0-9 Units Subcutaneous TID WC  . [START  ON 03/24/2016] insulin (NOVOLIN-R) infusion   Intravenous To OR  . [START ON 03/24/2016] magnesium sulfate  40 mEq Other To OR  . metoprolol tartrate  25 mg Oral BID  . multivitamin  1 tablet Oral Daily  . [START ON 03/24/2016] nitroGLYCERIN  2-200 mcg/min Intravenous To OR  . pantoprazole  40 mg Oral Daily  . [START ON 03/24/2016] phenylephrine (NEO-SYNEPHRINE) Adult infusion  30-200 mcg/min Intravenous To OR  . [START ON 03/24/2016] potassium chloride  80 mEq Other To OR  . sodium chloride flush  3 mL Intravenous Q12H  . sodium chloride flush  3 mL Intravenous Q12H  . [START ON 03/24/2016] tranexamic acid (CYKLOKAPRON) infusion ([facility])  1.5 mg/kg/hr Intravenous To OR  . [START ON 03/24/2016] tranexamic acid  15 mg/kg Intravenous To OR  . [START ON 03/24/2016] tranexamic acid  2 mg/kg Intracatheter To OR  . [START ON 03/24/2016] vancomycin  1,250 mg Intravenous To OR   Continuous Infusions: . heparin 950 Units/hr (03/23/16 [id number])  . nitroGLYCERIN 5 mcg/min (03/23/16 0600)   PRN Meds:.sodium chloride, sodium chloride, acetaminophen, nitroGLYCERIN, ondansetron (ZOFRAN) IV, oxybutynin, sodium chloride flush, sodium chloride flush, traMADol  General appearance: alert and cooperative Neurologic: intact Heart: regular rate and rhythm, S1,  S2 normal, no murmur, click, rub or gallop Lungs: clear to auscultation bilaterally Abdomen: soft, non-tender; bowel sounds normal; no masses,  no organomegaly Extremities: extremities normal, atraumatic, no cyanosis or edema and Homans sign is negative, no sign of DVT  Lab Results: CBC: Recent Labs  03/22/16 1034 03/23/16 0448  WBC 10.7* 7.8  HGB 13.3 11.5*  HCT 40.8 37.7  PLT 322 276   BMET:  Recent Labs  03/22/16 1034 03/23/16 0448  NA 135 139  K 4.3 4.1  CL 95* 99*  CO2 26 27  GLUCOSE 177* 152*  BUN 19 12  CREATININE 1.30* 1.21*  CALCIUM 10.1 9.5    CMET: Lab Results  Component Value Date   WBC 7.8 03/23/2016   HGB 11.5 (L) 03/23/2016    HCT 37.7 03/23/2016   PLT 276 03/23/2016   GLUCOSE 152 (H) 03/23/2016   CHOL 179 11/03/2015   TRIG 178.0 (H) 11/03/2015   HDL 45.20 11/03/2015   LDLDIRECT 90.0 05/05/2015   LDLCALC 98 11/03/2015   ALT 19 03/23/2016   AST 40 03/23/2016   NA 139 03/23/2016   K 4.1 03/23/2016   CL 99 (L) 03/23/2016   CREATININE 1.21 (H) 03/23/2016   BUN 12 03/23/2016   CO2 27 03/23/2016   TSH 1.74 11/03/2015   INR 1.07 03/23/2016   HGBA1C 7.6 (H) 03/01/2016   MICROALBUR 1.1 11/03/2015    PT/INR:  Recent Labs  03/23/16 0448  LABPROT 13.9  INR 1.07   Radiology: No results found.   Assessment/Plan: S/P Procedure(s) (LRB): Left Heart Cath and Coronary Angiography (N/A)  Plan cabg in am The goals risks and alternatives of the planned surgical procedure CABG have been discussed with the patient in detail. The risks of the procedure including death, infection, stroke, myocardial infarction, bleeding, blood transfusion have all been discussed specifically.  I have quoted Andrea Wilkinson a 4 % of perioperative mortality and a complication rate as high as 35 %. The patient's questions have been answered.Andrea Wilkinson is willing  to proceed with the planned procedure.     Grace Isaac 03/23/2016 5:57 PM

## 2016-03-23 NOTE — Anesthesia Preprocedure Evaluation (Signed)
Anesthesia Evaluation  Patient identified by MRN, date of birth, ID band Patient awake    Reviewed: Allergy & Precautions, NPO status , Patient's Chart, lab work & pertinent test results  Airway Mallampati: II  TM Distance: >3 FB     Dental  (+) Edentulous Upper, Edentulous Lower   Pulmonary    breath sounds clear to auscultation       Cardiovascular hypertension, + angina + CAD and + Past MI   Rhythm:Regular Rate:Normal     Neuro/Psych PSYCHIATRIC DISORDERS Depression  Neuromuscular disease    GI/Hepatic hiatal hernia, GERD  ,  Endo/Other  diabetes  Renal/GU Renal disease     Musculoskeletal   Abdominal   Peds  Hematology   Anesthesia Other Findings   Reproductive/Obstetrics                             Anesthesia Physical  Anesthesia Plan  ASA: IV  Anesthesia Plan: General   Post-op Pain Management:    Induction: Intravenous  Airway Management Planned: Oral ETT  Additional Equipment: PA Cath, Ultrasound Guidance Line Placement, CVP, Arterial line and TEE  Intra-op Plan:   Post-operative Plan: Post-operative intubation/ventilation  Informed Consent: I have reviewed the patients History and Physical, chart, labs and discussed the procedure including the risks, benefits and alternatives for the proposed anesthesia with the patient or authorized representative who has indicated his/her understanding and acceptance.   Dental advisory given  Plan Discussed with: CRNA  Anesthesia Plan Comments:         Anesthesia Quick Evaluation

## 2016-03-23 NOTE — Progress Notes (Signed)
Chaplain presented to the patient, and introduced self as Chaplain. The patient shared that she is scheduled for heart surgery on tomorrow morning. She has good family and spiritual support and is in positive frame of mind. Chaplain offered a prayer of healing and wholeness for the patient, and will follow up for continued spiritual care support as needed. [place] [phone]

## 2016-03-23 NOTE — Care Management Note (Signed)
Case Management Note  Patient Details  Name: Andrea Wilkinson MRN: [id number] Date of Birth: Nov 10, 1931  Subjective/Objective:         Adm w mi           Action/Plan: lives at home   Expected Discharge Date:   (unknown)               Expected Discharge Plan:  Home/Self Care  In-House Referral:     Discharge planning Services     Post Acute Care Choice:    Choice offered to:     DME Arranged:    DME Agency:     HH Arranged:    [place] Agency:     Status of Service:  In process, will continue to follow  If discussed at Long Length of Stay Meetings, dates discussed:    Additional Comments:monitering for dc needs as pt progresses.  Lacretia Leigh, RN 03/23/2016, 10:34 AM

## 2016-03-24 ENCOUNTER — Inpatient Hospital Stay (HOSPITAL_COMMUNITY): Payer: Commercial Managed Care - HMO

## 2016-03-24 ENCOUNTER — Inpatient Hospital Stay (HOSPITAL_COMMUNITY): Payer: Commercial Managed Care - HMO | Admitting: Certified Registered Nurse Anesthetist

## 2016-03-24 ENCOUNTER — Encounter (HOSPITAL_COMMUNITY)
Admission: EM | Disposition: A | Discharge status: Home / Self Care | Payer: Self-pay | Source: Home / Self Care | Attending: Cardiothoracic Surgery

## 2016-03-24 ENCOUNTER — Encounter (HOSPITAL_COMMUNITY): Payer: Self-pay | Admitting: Certified Registered Nurse Anesthetist

## 2016-03-24 DIAGNOSIS — I25110 Atherosclerotic heart disease of native coronary artery with unstable angina pectoris: Secondary | ICD-10-CM

## 2016-03-24 DIAGNOSIS — Z951 Presence of aortocoronary bypass graft: Secondary | ICD-10-CM

## 2016-03-24 HISTORY — PX: TEE WITHOUT CARDIOVERSION: SHX5443

## 2016-03-24 HISTORY — PX: CORONARY ARTERY BYPASS GRAFT: SHX141

## 2016-03-24 LAB — CBC
HCT: 35.9 % — ABNORMAL LOW (ref 36.0–46.0)
HEMATOCRIT: 35.2 % — AB (ref 36.0–46.0)
HEMATOCRIT: 33.4 % — AB (ref 36.0–46.0)
HEMOGLOBIN: 10.8 g/dL — AB (ref 12.0–15.0)
Hemoglobin: 11 g/dL — ABNORMAL LOW (ref 12.0–15.0)
Hemoglobin: 11.5 g/dL — ABNORMAL LOW (ref 12.0–15.0)
MCH: 28.3 pg (ref 26.0–34.0)
MCH: 29.2 pg (ref 26.0–34.0)
MCH: 28.4 pg (ref 26.0–34.0)
MCHC: 30.7 g/dL (ref 30.0–36.0)
MCHC: 32.9 g/dL (ref 30.0–36.0)
MCHC: 32 g/dL (ref 30.0–36.0)
MCV: 92.4 fL (ref 78.0–100.0)
MCV: 88.6 fL (ref 78.0–100.0)
MCV: 88.6 fL (ref 78.0–100.0)
PLATELETS: 124 10*3/uL — AB (ref 150–400)
Platelets: 238 10*3/uL (ref 150–400)
Platelets: 145 10*3/uL — ABNORMAL LOW (ref 150–400)
RBC: 3.81 MIL/uL — AB (ref 3.87–5.11)
RBC: 3.77 MIL/uL — ABNORMAL LOW (ref 3.87–5.11)
RBC: 4.05 MIL/uL (ref 3.87–5.11)
RDW: 13.8 % (ref 11.5–15.5)
RDW: 15.1 % (ref 11.5–15.5)
RDW: 15.1 % (ref 11.5–15.5)
WBC: 8.1 10*3/uL (ref 4.0–10.5)
WBC: 8 10*3/uL (ref 4.0–10.5)
WBC: 11.5 10*3/uL — ABNORMAL HIGH (ref 4.0–10.5)

## 2016-03-24 LAB — POCT I-STAT 3, ART BLOOD GAS (G3+)
ACID-BASE DEFICIT: 5 mmol/L — AB (ref 0.0–2.0)
Acid-Base Excess: 2 mmol/L (ref 0.0–2.0)
Acid-base deficit: 6 mmol/L — ABNORMAL HIGH (ref 0.0–2.0)
Bicarbonate: 22.8 mmol/L (ref 20.0–28.0)
Bicarbonate: 20.7 mmol/L (ref 20.0–28.0)
Bicarbonate: 26.4 mmol/L (ref 20.0–28.0)
Bicarbonate: 25.9 mmol/L (ref 20.0–28.0)
O2 SAT: 94 %
O2 SAT: 98 %
O2 Saturation: 93 %
O2 Saturation: 95 %
PCO2 ART: 47.8 mmHg (ref 32.0–48.0)
PCO2 ART: 39.8 mmHg (ref 32.0–48.0)
PH ART: 7.283 — AB (ref 7.350–7.450)
PO2 ART: 78 mmHg — AB (ref 83.0–108.0)
PO2 ART: 100 mmHg (ref 83.0–108.0)
Patient temperature: 36.5
Patient temperature: 36.8
Patient temperature: 37.5
TCO2: 25 mmol/L (ref 0–100)
TCO2: 22 mmol/L (ref 0–100)
TCO2: 28 mmol/L (ref 0–100)
TCO2: 27 mmol/L (ref 0–100)
pCO2 arterial: 55.5 mmHg — ABNORMAL HIGH (ref 32.0–48.0)
pCO2 arterial: 43.6 mmHg (ref 32.0–48.0)
pH, Arterial: 7.219 — ABNORMAL LOW (ref 7.350–7.450)
pH, Arterial: 7.35 (ref 7.350–7.450)
pH, Arterial: 7.423 (ref 7.350–7.450)
pO2, Arterial: 82 mmHg — ABNORMAL LOW (ref 83.0–108.0)
pO2, Arterial: 75 mmHg — ABNORMAL LOW (ref 83.0–108.0)

## 2016-03-24 LAB — POCT I-STAT, CHEM 8
BUN: 12 mg/dL (ref 6–20)
CALCIUM ION: 1.15 mmol/L (ref 1.15–1.40)
CHLORIDE: 103 mmol/L (ref 101–111)
Creatinine, Ser: 0.9 mg/dL (ref 0.44–1.00)
Glucose, Bld: 122 mg/dL — ABNORMAL HIGH (ref 65–99)
HCT: 33 % — ABNORMAL LOW (ref 36.0–46.0)
Hemoglobin: 11.2 g/dL — ABNORMAL LOW (ref 12.0–15.0)
POTASSIUM: 4.3 mmol/L (ref 3.5–5.1)
SODIUM: 139 mmol/L (ref 135–145)
TCO2: 26 mmol/L (ref 0–100)

## 2016-03-24 LAB — POCT I-STAT 4, (NA,K, GLUC, HGB,HCT)
Glucose, Bld: 122 mg/dL — ABNORMAL HIGH (ref 65–99)
HCT: 33 % — ABNORMAL LOW (ref 36.0–46.0)
Hemoglobin: 11.2 g/dL — ABNORMAL LOW (ref 12.0–15.0)
Potassium: 3.8 mmol/L (ref 3.5–5.1)
Sodium: 139 mmol/L (ref 135–145)

## 2016-03-24 LAB — HEPARIN LEVEL (UNFRACTIONATED): HEPARIN UNFRACTIONATED: 0.52 [IU]/mL (ref 0.30–0.70)

## 2016-03-24 LAB — BASIC METABOLIC PANEL
Anion gap: 9 (ref 5–15)
BUN: 13 mg/dL (ref 6–20)
CO2: 25 mmol/L (ref 22–32)
Calcium: 9.6 mg/dL (ref 8.9–10.3)
Chloride: 103 mmol/L (ref 101–111)
Creatinine, Ser: 1.27 mg/dL — ABNORMAL HIGH (ref 0.44–1.00)
GFR calc Af Amer: 44 mL/min — ABNORMAL LOW (ref >60)
GFR calc non Af Amer: 38 mL/min — ABNORMAL LOW (ref >60)
Glucose, Bld: 165 mg/dL — ABNORMAL HIGH (ref 65–99)
Potassium: 4.1 mmol/L (ref 3.5–5.1)
Sodium: 137 mmol/L (ref 135–145)

## 2016-03-24 LAB — GLUCOSE, CAPILLARY
GLUCOSE-CAPILLARY: 106 mg/dL — AB (ref 65–99)
GLUCOSE-CAPILLARY: 101 mg/dL — AB (ref 65–99)
GLUCOSE-CAPILLARY: 104 mg/dL — AB (ref 65–99)
GLUCOSE-CAPILLARY: 118 mg/dL — AB (ref 65–99)
GLUCOSE-CAPILLARY: 176 mg/dL — AB (ref 65–99)
Glucose-Capillary: 146 mg/dL — ABNORMAL HIGH (ref 65–99)
Glucose-Capillary: 77 mg/dL (ref 65–99)
Glucose-Capillary: 95 mg/dL (ref 65–99)

## 2016-03-24 LAB — PLATELET COUNT: Platelets: 135 10*3/uL — ABNORMAL LOW (ref 150–400)

## 2016-03-24 LAB — HEMOGLOBIN AND HEMATOCRIT, BLOOD
HCT: 26.6 % — ABNORMAL LOW (ref 36.0–46.0)
Hemoglobin: 8.5 g/dL — ABNORMAL LOW (ref 12.0–15.0)

## 2016-03-24 LAB — APTT: APTT: 35 s (ref 24–36)

## 2016-03-24 LAB — CREATININE, SERUM
Creatinine, Ser: 0.98 mg/dL (ref 0.44–1.00)
GFR calc Af Amer: >60 mL/min (ref >60)
GFR calc non Af Amer: 52 mL/min — ABNORMAL LOW (ref >60)

## 2016-03-24 LAB — PROTIME-INR
INR: 1.49
PROTHROMBIN TIME: 18.1 s — AB (ref 11.4–15.2)

## 2016-03-24 LAB — MAGNESIUM: Magnesium: 3.3 mg/dL — ABNORMAL HIGH (ref 1.7–2.4)

## 2016-03-24 LAB — PREPARE RBC (CROSSMATCH)

## 2016-03-24 SURGERY — CORONARY ARTERY BYPASS GRAFTING (CABG)
Anesthesia: General | Site: Chest

## 2016-03-24 MED ORDER — LACTATED RINGERS IV SOLN: INTRAVENOUS | Status: DC

## 2016-03-24 MED ORDER — MIDAZOLAM HCL 10 MG/2ML IJ SOLN
INTRAMUSCULAR | Status: AC
  Filled 2016-03-24: qty 2

## 2016-03-24 MED ORDER — MORPHINE SULFATE (PF) 2 MG/ML IV SOLN
1.0000 mg | INTRAVENOUS | Status: DC | PRN
  Administered 2016-03-25: 1 mg via INTRAVENOUS

## 2016-03-24 MED ORDER — ROCURONIUM BROMIDE 10 MG/ML (PF) SYRINGE
PREFILLED_SYRINGE | INTRAVENOUS | Status: DC | PRN
  Administered 2016-03-24: 100 mg via INTRAVENOUS

## 2016-03-24 MED ORDER — LACTATED RINGERS IV SOLN
INTRAVENOUS | Status: DC | PRN
  Administered 2016-03-24: 07:00:00 via INTRAVENOUS

## 2016-03-24 MED ORDER — METOPROLOL TARTRATE 5 MG/5ML IV SOLN
2.5000 mg | INTRAVENOUS | Status: DC | PRN
  Administered 2016-03-25: 2.5 mg via INTRAVENOUS
  Administered 2016-03-26 – 2016-03-27 (×2): 5 mg via INTRAVENOUS
  Filled 2016-03-24 (×3): qty 5

## 2016-03-24 MED ORDER — ASPIRIN 81 MG PO CHEW: 324.0000 mg | CHEWABLE_TABLET | Freq: Every day | ORAL | Status: DC

## 2016-03-24 MED ORDER — ORAL CARE MOUTH RINSE
15.0000 mL | Freq: Four times a day (QID) | OROMUCOSAL | Status: DC
  Administered 2016-03-24 – 2016-03-26 (×8): 15 mL via OROMUCOSAL

## 2016-03-24 MED ORDER — SODIUM BICARBONATE 8.4 % IV SOLN
50.0000 meq | Freq: Once | INTRAVENOUS | Status: AC
  Administered 2016-03-24: 50 meq via INTRAVENOUS

## 2016-03-24 MED ORDER — ACETAMINOPHEN 160 MG/5ML PO SOLN: 650.0000 mg | Freq: Once | ORAL | Status: AC

## 2016-03-24 MED ORDER — SODIUM CHLORIDE 0.9% FLUSH
3.0000 mL | Freq: Two times a day (BID) | INTRAVENOUS | Status: DC
  Administered 2016-03-25 – 2016-03-27 (×5): 3 mL via INTRAVENOUS

## 2016-03-24 MED ORDER — VECURONIUM BROMIDE 10 MG IV SOLR
INTRAVENOUS | Status: DC | PRN
  Administered 2016-03-24 (×3): 5 mg via INTRAVENOUS

## 2016-03-24 MED ORDER — INSULIN REGULAR BOLUS VIA INFUSION
0.0000 [IU] | Freq: Three times a day (TID) | INTRAVENOUS | Status: DC
  Filled 2016-03-24: qty 10

## 2016-03-24 MED ORDER — MIDAZOLAM HCL 2 MG/2ML IJ SOLN
INTRAMUSCULAR | Status: AC
  Filled 2016-03-24: qty 2

## 2016-03-24 MED ORDER — DEXTROSE 5 % IV SOLN
1.5000 g | Freq: Two times a day (BID) | INTRAVENOUS | Status: AC
  Administered 2016-03-25 – 2016-03-26 (×4): 1.5 g via INTRAVENOUS
  Filled 2016-03-24 (×4): qty 1.5

## 2016-03-24 MED ORDER — SODIUM CHLORIDE 0.9 % IV SOLN: INTRAVENOUS | Status: DC

## 2016-03-24 MED ORDER — 0.9 % SODIUM CHLORIDE (POUR BTL) OPTIME
TOPICAL | Status: DC | PRN
  Administered 2016-03-24: 6000 mL

## 2016-03-24 MED ORDER — ALBUMIN HUMAN 5 % IV SOLN
INTRAVENOUS | Status: DC | PRN
  Administered 2016-03-24 (×2): via INTRAVENOUS

## 2016-03-24 MED ORDER — CHLORHEXIDINE GLUCONATE 0.12 % MT SOLN
15.0000 mL | OROMUCOSAL | Status: AC
  Administered 2016-03-24: 15 mL via OROMUCOSAL

## 2016-03-24 MED ORDER — DOCUSATE SODIUM 100 MG PO CAPS
200.0000 mg | ORAL_CAPSULE | Freq: Every day | ORAL | Status: DC
  Administered 2016-03-25 – 2016-03-27 (×3): 200 mg via ORAL
  Filled 2016-03-24 (×3): qty 2

## 2016-03-24 MED ORDER — SODIUM CHLORIDE 0.9% FLUSH: 3.0000 mL | INTRAVENOUS | Status: DC | PRN

## 2016-03-24 MED ORDER — PHENYLEPHRINE HCL 10 MG/ML IJ SOLN
0.0000 ug/min | INTRAVENOUS | Status: DC
  Filled 2016-03-24: qty 2

## 2016-03-24 MED ORDER — METOPROLOL TARTRATE 25 MG/10 ML ORAL SUSPENSION: 12.5000 mg | Freq: Two times a day (BID) | ORAL | Status: DC

## 2016-03-24 MED ORDER — ACETAMINOPHEN 650 MG RE SUPP
650.0000 mg | Freq: Once | RECTAL | Status: AC
Start: 2016-03-24 — End: 2016-03-24
  Administered 2016-03-24: 650 mg via RECTAL

## 2016-03-24 MED ORDER — HEPARIN SODIUM (PORCINE) 1000 UNIT/ML IJ SOLN
INTRAMUSCULAR | Status: AC
  Filled 2016-03-24: qty 1

## 2016-03-24 MED ORDER — MIDAZOLAM HCL 5 MG/5ML IJ SOLN
INTRAMUSCULAR | Status: DC | PRN
  Administered 2016-03-24: 1 mg via INTRAVENOUS
  Administered 2016-03-24: 2 mg via INTRAVENOUS
  Administered 2016-03-24: 4 mg via INTRAVENOUS
  Administered 2016-03-24: 3 mg via INTRAVENOUS

## 2016-03-24 MED ORDER — ATORVASTATIN CALCIUM 40 MG PO TABS
40.0000 mg | ORAL_TABLET | Freq: Every day | ORAL | Status: DC
  Administered 2016-03-25 – 2016-03-29 (×5): 40 mg via ORAL
  Filled 2016-03-24 (×5): qty 1

## 2016-03-24 MED ORDER — TRAMADOL HCL 50 MG PO TABS: 50.0000 mg | ORAL_TABLET | ORAL | Status: DC | PRN

## 2016-03-24 MED ORDER — METOPROLOL TARTRATE 12.5 MG HALF TABLET
12.5000 mg | ORAL_TABLET | Freq: Two times a day (BID) | ORAL | Status: DC
  Administered 2016-03-25 – 2016-03-28 (×7): 12.5 mg via ORAL
  Filled 2016-03-24 (×7): qty 1

## 2016-03-24 MED ORDER — ALBUMIN HUMAN 5 % IV SOLN
250.0000 mL | INTRAVENOUS | Status: DC | PRN
  Administered 2016-03-24 (×2): 250 mL via INTRAVENOUS

## 2016-03-24 MED ORDER — OXYCODONE HCL 5 MG PO TABS: 5.0000 mg | ORAL_TABLET | ORAL | Status: DC | PRN

## 2016-03-24 MED ORDER — PROTAMINE SULFATE 10 MG/ML IV SOLN
INTRAVENOUS | Status: AC
  Filled 2016-03-24: qty 25

## 2016-03-24 MED ORDER — INSULIN REGULAR HUMAN 100 UNIT/ML IJ SOLN
INTRAMUSCULAR | Status: DC
  Administered 2016-03-24: 18:00:00 via INTRAVENOUS
  Filled 2016-03-24: qty 2.5

## 2016-03-24 MED ORDER — HEMOSTATIC AGENTS (NO CHARGE) OPTIME
TOPICAL | Status: DC | PRN
  Administered 2016-03-24: 1 via TOPICAL

## 2016-03-24 MED ORDER — DEXMEDETOMIDINE HCL IN NACL 200 MCG/50ML IV SOLN
0.0000 ug/kg/h | INTRAVENOUS | Status: DC
  Administered 2016-03-24: 0.5 ug/kg/h via INTRAVENOUS
  Filled 2016-03-24: qty 50

## 2016-03-24 MED ORDER — BISACODYL 5 MG PO TBEC
10.0000 mg | DELAYED_RELEASE_TABLET | Freq: Every day | ORAL | Status: DC
  Administered 2016-03-25 – 2016-03-27 (×3): 10 mg via ORAL
  Filled 2016-03-24 (×3): qty 2

## 2016-03-24 MED ORDER — DEXMEDETOMIDINE HCL IN NACL 400 MCG/100ML IV SOLN
INTRAVENOUS | Status: DC | PRN
  Administered 2016-03-24: .5 ug/kg/h via INTRAVENOUS

## 2016-03-24 MED ORDER — FENTANYL CITRATE (PF) 250 MCG/5ML IJ SOLN
INTRAMUSCULAR | Status: AC
  Filled 2016-03-24: qty 25

## 2016-03-24 MED ORDER — ACETAMINOPHEN 500 MG PO TABS
1000.0000 mg | ORAL_TABLET | Freq: Four times a day (QID) | ORAL | Status: DC
  Administered 2016-03-25 – 2016-03-27 (×13): 1000 mg via ORAL
  Filled 2016-03-24 (×13): qty 2

## 2016-03-24 MED ORDER — DOPAMINE-DEXTROSE 3.2-5 MG/ML-% IV SOLN
INTRAVENOUS | Status: DC | PRN
  Administered 2016-03-24: 2 ug/kg/min via INTRAVENOUS

## 2016-03-24 MED ORDER — LACTATED RINGERS IV SOLN: 500.0000 mL | Freq: Once | INTRAVENOUS | Status: DC | PRN

## 2016-03-24 MED ORDER — SODIUM BICARBONATE 8.4 % IV SOLN
50.0000 meq | Freq: Once | INTRAVENOUS | Status: AC
Start: 2016-03-24 — End: 2016-03-24
  Administered 2016-03-24: 50 meq via INTRAVENOUS

## 2016-03-24 MED ORDER — MIDAZOLAM HCL 2 MG/2ML IJ SOLN
2.0000 mg | INTRAMUSCULAR | Status: DC | PRN
Start: 2016-03-24 — End: 2016-03-25

## 2016-03-24 MED ORDER — PROPOFOL 10 MG/ML IV BOLUS
INTRAVENOUS | Status: DC | PRN
  Administered 2016-03-24: 40 mg via INTRAVENOUS

## 2016-03-24 MED ORDER — ACETAMINOPHEN 160 MG/5ML PO SOLN: 1000.0000 mg | Freq: Four times a day (QID) | ORAL | Status: DC

## 2016-03-24 MED ORDER — LACTATED RINGERS IV SOLN
INTRAVENOUS | Status: DC | PRN
  Administered 2016-03-24 (×3): via INTRAVENOUS

## 2016-03-24 MED ORDER — POTASSIUM CHLORIDE 10 MEQ/50ML IV SOLN
10.0000 meq | INTRAVENOUS | Status: AC
  Administered 2016-03-24 (×3): 10 meq via INTRAVENOUS

## 2016-03-24 MED ORDER — VANCOMYCIN HCL IN DEXTROSE 1-5 GM/200ML-% IV SOLN
1000.0000 mg | Freq: Once | INTRAVENOUS | Status: AC
  Administered 2016-03-24: 1000 mg via INTRAVENOUS
  Filled 2016-03-24: qty 200

## 2016-03-24 MED ORDER — MAGNESIUM SULFATE 4 GM/100ML IV SOLN
4.0000 g | Freq: Once | INTRAVENOUS | Status: AC
  Administered 2016-03-24: 4 g via INTRAVENOUS
  Filled 2016-03-24: qty 100

## 2016-03-24 MED ORDER — VECURONIUM BROMIDE 10 MG IV SOLR
INTRAVENOUS | Status: AC
  Filled 2016-03-24: qty 20

## 2016-03-24 MED ORDER — CHLORHEXIDINE GLUCONATE 0.12% ORAL RINSE (MEDLINE KIT)
15.0000 mL | Freq: Two times a day (BID) | OROMUCOSAL | Status: DC
  Administered 2016-03-24 – 2016-03-26 (×4): 15 mL via OROMUCOSAL

## 2016-03-24 MED ORDER — BISACODYL 10 MG RE SUPP: 10.0000 mg | Freq: Every day | RECTAL | Status: DC

## 2016-03-24 MED ORDER — PLASMA-LYTE 148 IV SOLN
INTRAVENOUS | Status: DC | PRN
  Administered 2016-03-24: 09:00:00 via INTRAVASCULAR

## 2016-03-24 MED ORDER — FENTANYL CITRATE (PF) 250 MCG/5ML IJ SOLN
INTRAMUSCULAR | Status: DC | PRN
Start: 2016-03-24 — End: 2016-03-24
  Administered 2016-03-24: 100 ug via INTRAVENOUS
  Administered 2016-03-24: 150 ug via INTRAVENOUS
  Administered 2016-03-24 (×2): 100 ug via INTRAVENOUS
  Administered 2016-03-24: 150 ug via INTRAVENOUS
  Administered 2016-03-24: 200 ug via INTRAVENOUS
  Administered 2016-03-24: 100 ug via INTRAVENOUS
  Administered 2016-03-24: 150 ug via INTRAVENOUS
  Administered 2016-03-24: 50 ug via INTRAVENOUS
  Administered 2016-03-24: 150 ug via INTRAVENOUS

## 2016-03-24 MED ORDER — SODIUM CHLORIDE 0.9 % IV SOLN: 250.0000 mL | INTRAVENOUS | Status: DC

## 2016-03-24 MED ORDER — PHENYLEPHRINE 40 MCG/ML (10ML) SYRINGE FOR IV PUSH (FOR BLOOD PRESSURE SUPPORT)
PREFILLED_SYRINGE | INTRAVENOUS | Status: AC
Start: 2016-03-24 — End: 2016-03-24
  Filled 2016-03-24: qty 10

## 2016-03-24 MED ORDER — ONDANSETRON HCL 4 MG/2ML IJ SOLN: 4.0000 mg | Freq: Four times a day (QID) | INTRAMUSCULAR | Status: DC | PRN

## 2016-03-24 MED ORDER — PANTOPRAZOLE SODIUM 40 MG PO TBEC
40.0000 mg | DELAYED_RELEASE_TABLET | Freq: Every day | ORAL | Status: DC
  Administered 2016-03-26 – 2016-03-28 (×3): 40 mg via ORAL
  Filled 2016-03-24 (×3): qty 1

## 2016-03-24 MED ORDER — HEMOSTATIC AGENTS (NO CHARGE) OPTIME
TOPICAL | Status: DC | PRN
  Administered 2016-03-24 (×2): 1 via TOPICAL

## 2016-03-24 MED ORDER — ARTIFICIAL TEARS OP OINT
TOPICAL_OINTMENT | OPHTHALMIC | Status: DC | PRN
  Administered 2016-03-24: 1 via OPHTHALMIC

## 2016-03-24 MED ORDER — METOCLOPRAMIDE HCL 5 MG/ML IJ SOLN
10.0000 mg | Freq: Four times a day (QID) | INTRAMUSCULAR | Status: DC
  Administered 2016-03-24 – 2016-03-27 (×10): 10 mg via INTRAVENOUS
  Filled 2016-03-24 (×11): qty 2

## 2016-03-24 MED ORDER — NITROGLYCERIN IN D5W 200-5 MCG/ML-% IV SOLN: 0.0000 ug/min | INTRAVENOUS | Status: DC

## 2016-03-24 MED ORDER — PROTAMINE SULFATE 10 MG/ML IV SOLN
INTRAVENOUS | Status: DC | PRN
Start: 2016-03-24 — End: 2016-03-24
  Administered 2016-03-24: 20 mg via INTRAVENOUS
  Administered 2016-03-24: 130 mg via INTRAVENOUS

## 2016-03-24 MED ORDER — ROCURONIUM BROMIDE 10 MG/ML (PF) SYRINGE
PREFILLED_SYRINGE | INTRAVENOUS | Status: AC
  Filled 2016-03-24: qty 10

## 2016-03-24 MED ORDER — SODIUM CHLORIDE 0.45 % IV SOLN
INTRAVENOUS | Status: DC | PRN
  Administered 2016-03-24: 16:00:00 via INTRAVENOUS

## 2016-03-24 MED ORDER — HEPARIN SODIUM (PORCINE) 1000 UNIT/ML IJ SOLN
INTRAMUSCULAR | Status: DC | PRN
  Administered 2016-03-24: 15000 [IU] via INTRAVENOUS

## 2016-03-24 MED ORDER — DOPAMINE-DEXTROSE 3.2-5 MG/ML-% IV SOLN: 3.0000 ug/kg/min | INTRAVENOUS | Status: DC

## 2016-03-24 MED ORDER — MORPHINE SULFATE (PF) 2 MG/ML IV SOLN
2.0000 mg | INTRAVENOUS | Status: DC | PRN
  Administered 2016-03-25 (×2): 2 mg via INTRAVENOUS
  Filled 2016-03-24 (×3): qty 1

## 2016-03-24 MED ORDER — FAMOTIDINE IN NACL 20-0.9 MG/50ML-% IV SOLN
20.0000 mg | Freq: Two times a day (BID) | INTRAVENOUS | Status: AC
  Administered 2016-03-24 (×2): 20 mg via INTRAVENOUS
  Filled 2016-03-24: qty 50

## 2016-03-24 MED ORDER — PROPOFOL 10 MG/ML IV BOLUS
INTRAVENOUS | Status: AC
  Filled 2016-03-24: qty 20

## 2016-03-24 MED ORDER — ASPIRIN EC 325 MG PO TBEC
325.0000 mg | DELAYED_RELEASE_TABLET | Freq: Every day | ORAL | Status: DC
  Administered 2016-03-25 – 2016-03-28 (×4): 325 mg via ORAL
  Filled 2016-03-24 (×4): qty 1

## 2016-03-24 MED FILL — Magnesium Sulfate Inj 50%: INTRAMUSCULAR | Qty: 2 | Status: AC

## 2016-03-24 MED FILL — Heparin Sodium (Porcine) Inj 1000 Unit/ML: INTRAMUSCULAR | Qty: 30 | Status: AC

## 2016-03-24 MED FILL — Potassium Chloride Inj 2 mEq/ML: INTRAVENOUS | Qty: 40 | Status: AC

## 2016-03-24 SURGICAL SUPPLY — 78 items
ADH SKN CLS APL DERMABOND .7 (GAUZE/BANDAGES/DRESSINGS) ×4
BAG DECANTER FOR FLEXI CONT (MISCELLANEOUS) ×3 IMPLANT
BANDAGE ACE 4X5 VEL STRL LF (GAUZE/BANDAGES/DRESSINGS) ×4 IMPLANT
BANDAGE ACE 6X5 VEL STRL LF (GAUZE/BANDAGES/DRESSINGS) ×4 IMPLANT
BLADE STERNUM SYSTEM 6 (BLADE) ×3 IMPLANT
BNDG GAUZE ELAST 4 BULKY (GAUZE/BANDAGES/DRESSINGS) ×4 IMPLANT
CANISTER SUCTION 2500CC (MISCELLANEOUS) ×3 IMPLANT
CANNULA VEN 2 STAGE (MISCELLANEOUS) ×1 IMPLANT
CATH CPB KIT GERHARDT (MISCELLANEOUS) ×3 IMPLANT
CATH THORACIC 28FR (CATHETERS) ×3 IMPLANT
CLIP TI WIDE RED SMALL 24 (CLIP) ×1 IMPLANT
CRADLE DONUT ADULT HEAD (MISCELLANEOUS) ×3 IMPLANT
DERMABOND ADVANCED (GAUZE/BANDAGES/DRESSINGS) ×2
DERMABOND ADVANCED .7 DNX12 (GAUZE/BANDAGES/DRESSINGS) IMPLANT
DRAIN CHANNEL 28F RND 3/8 FF (WOUND CARE) ×3 IMPLANT
DRAPE CARDIOVASCULAR INCISE (DRAPES) ×3
DRAPE SLUSH/WARMER DISC (DRAPES) ×3 IMPLANT
DRAPE SRG 135X102X78XABS (DRAPES) ×2 IMPLANT
DRSG AQUACEL AG ADV 3.5X14 (GAUZE/BANDAGES/DRESSINGS) ×3 IMPLANT
ELECT BLADE 4.0 EZ CLEAN MEGAD (MISCELLANEOUS) ×3
ELECT REM PT RETURN 9FT ADLT (ELECTROSURGICAL) ×6
ELECTRODE BLDE 4.0 EZ CLN MEGD (MISCELLANEOUS) ×2 IMPLANT
ELECTRODE REM PT RTRN 9FT ADLT (ELECTROSURGICAL) ×4 IMPLANT
FELT TEFLON 1X6 (MISCELLANEOUS) ×5 IMPLANT
GAUZE SPONGE 4X4 12PLY STRL (GAUZE/BANDAGES/DRESSINGS) ×7 IMPLANT
GLOVE BIO SURGEON STRL SZ 6 (GLOVE) ×2 IMPLANT
GLOVE BIO SURGEON STRL SZ 6.5 (GLOVE) ×9 IMPLANT
GLOVE BIOGEL M 6.5 STRL (GLOVE) ×2 IMPLANT
GLOVE BIOGEL PI IND STRL 6 (GLOVE) IMPLANT
GLOVE BIOGEL PI IND STRL 6.5 (GLOVE) IMPLANT
GLOVE BIOGEL PI IND STRL 7.0 (GLOVE) IMPLANT
GLOVE BIOGEL PI INDICATOR 6 (GLOVE) ×3
GLOVE BIOGEL PI INDICATOR 6.5 (GLOVE) ×1
GLOVE BIOGEL PI INDICATOR 7.0 (GLOVE) ×2
GLOVE SURG SS PI 6.0 STRL IVOR (GLOVE) ×1 IMPLANT
GOWN STRL REUS W/ TWL LRG LVL3 (GOWN DISPOSABLE) ×8 IMPLANT
GOWN STRL REUS W/TWL LRG LVL3 (GOWN DISPOSABLE) ×33
HEMOSTAT POWDER SURGIFOAM 1G (HEMOSTASIS) ×9 IMPLANT
HEMOSTAT SURGICEL 2X14 (HEMOSTASIS) ×3 IMPLANT
KIT BASIN OR (CUSTOM PROCEDURE TRAY) ×3 IMPLANT
KIT CATH SUCT 8FR (CATHETERS) ×3 IMPLANT
KIT ROOM TURNOVER OR (KITS) ×3 IMPLANT
KIT SUCTION CATH 14FR (SUCTIONS) ×6 IMPLANT
KIT VASOVIEW HEMOPRO VH 3000 (KITS) ×3 IMPLANT
LEAD PACING MYOCARDI (MISCELLANEOUS) ×3 IMPLANT
MARKER GRAFT CORONARY BYPASS (MISCELLANEOUS) ×9 IMPLANT
NS IRRIG 1000ML POUR BTL (IV SOLUTION) ×16 IMPLANT
PACK OPEN HEART (CUSTOM PROCEDURE TRAY) ×3 IMPLANT
PAD ARMBOARD 7.5X6 YLW CONV (MISCELLANEOUS) ×6 IMPLANT
PAD ELECT DEFIB RADIOL ZOLL (MISCELLANEOUS) ×3 IMPLANT
PENCIL BUTTON HOLSTER BLD 10FT (ELECTRODE) ×3 IMPLANT
PUNCH AORTIC ROT 4.0MM RCL 40 (MISCELLANEOUS) ×1 IMPLANT
SENSOR MYOCARDIAL TEMP (MISCELLANEOUS) ×1 IMPLANT
SET CARDIOPLEGIA MPS 5001102 (MISCELLANEOUS) ×1 IMPLANT
SOLUTION ANTI FOG 6CC (MISCELLANEOUS) ×1 IMPLANT
SPONGE LAP 18X18 X RAY DECT (DISPOSABLE) ×2 IMPLANT
SUT BONE WAX W31G (SUTURE) ×3 IMPLANT
SUT MNCRL AB 4-0 PS2 18 (SUTURE) ×1 IMPLANT
SUT PROLENE 3 0 SH1 36 (SUTURE) ×3 IMPLANT
SUT PROLENE 4 0 TF (SUTURE) ×6 IMPLANT
SUT PROLENE 5 0 C 1 36 (SUTURE) ×3 IMPLANT
SUT PROLENE 6 0 C 1 30 (SUTURE) ×2 IMPLANT
SUT PROLENE 6 0 CC (SUTURE) ×9 IMPLANT
SUT PROLENE 7 0 BV1 MDA (SUTURE) ×4 IMPLANT
SUT PROLENE 8 0 BV175 6 (SUTURE) ×6 IMPLANT
SUT STEEL 6MS V (SUTURE) ×3 IMPLANT
SUT STEEL SZ 6 DBL 3X14 BALL (SUTURE) ×3 IMPLANT
SUT VIC AB 1 CTX 18 (SUTURE) ×6 IMPLANT
SUTURE E-PAK OPEN HEART (SUTURE) ×3 IMPLANT
SYSTEM SAHARA CHEST DRAIN ATS (WOUND CARE) ×3 IMPLANT
TAPE CLOTH SURG 4X10 WHT LF (GAUZE/BANDAGES/DRESSINGS) ×1 IMPLANT
TAPE PAPER 2X10 WHT MICROPORE (GAUZE/BANDAGES/DRESSINGS) ×1 IMPLANT
TOWEL OR 17X24 6PK STRL BLUE (TOWEL DISPOSABLE) ×6 IMPLANT
TOWEL OR 17X26 10 PK STRL BLUE (TOWEL DISPOSABLE) ×6 IMPLANT
TRAY FOLEY IC TEMP SENS 16FR (CATHETERS) ×3 IMPLANT
TUBING INSUFFLATION (TUBING) ×3 IMPLANT
UNDERPAD 30X30 (UNDERPADS AND DIAPERS) ×3 IMPLANT
WATER STERILE IRR 1000ML POUR (IV SOLUTION) ×6 IMPLANT

## 2016-03-24 NOTE — Anesthesia Procedure Notes (Signed)
Central Venous Catheter Insertion Performed by: anesthesiologist Patient location: Pre-op. Preanesthetic checklist: patient identified, IV checked, site marked, risks and benefits discussed, surgical consent, monitors and equipment checked, pre-op evaluation, timeout performed and anesthesia consent Position: Trendelenburg Lidocaine 1% used for infiltration Landmarks identified and Seldinger technique used Catheter size: 8.5 Fr Central line was placed.Sheath introducer Swan type and PA catheter depth:thermodilution and 40PA Cath depth:40 Procedure performed using ultrasound guided technique. Attempts: 1 Following insertion, line sutured and dressing applied. Post procedure assessment: blood return through all ports, free fluid flow and no air. Patient tolerated the procedure well with no immediate complications.

## 2016-03-24 NOTE — Progress Notes (Signed)
NIF -20, VC .6L

## 2016-03-24 NOTE — Progress Notes (Signed)
Patient ID: Andrea Wilkinson, female   DOB: 1931-10-01, 82 y.o.   MRN: [id number] EVENING ROUNDS NOTE :     [address]       [place] [address]             [phone]                 Day of Surgery Procedure(s) (LRB): CORONARY ARTERY BYPASS GRAFTING (CABG) x 4 utilizing left internal mammary artery and left greater saphenous vein, bilateral saphenous veins harvested endoscopicaly,  SVG to PD, LIMA  to LAD, SEQ SVG to OM1 and OM2 (N/A) TRANSESOPHAGEAL ECHOCARDIOGRAM (TEE) (N/A)  Total Length of Stay:  LOS: 2 days  BP 108/66   Pulse 89   Temp 98.4 F (36.9 C)   Resp 13   Ht 5' (1.524 m)   Wt 148 lb 5.9 oz (67.3 kg)   SpO2 97%   BMI 28.98 kg/m   .Intake/Output      10/06 0701 - 10/07 0700   P.O.    I.V. (mL/kg) 2953.1 (43.9)   Blood 500   NG/GT 15   IV Piggyback 1300   Total Intake(mL/kg) 4768.1 (70.8)   Urine (mL/kg/hr) 2380 (2.9)   Emesis/NG output 50 (0.1)   Blood 1000 (1.2)   Chest Tube 110 (0.1)   Total Output 3540   Net +1228.1         . sodium chloride 20 mL/hr at 03/24/16 1900  . [START ON 03/25/2016] sodium chloride    . sodium chloride    . dexmedetomidine 0.4 mcg/kg/hr (03/24/16 1900)  . DOPamine 2 mcg/kg/min (03/24/16 1900)  . insulin (NOVOLIN-R) infusion 1.2 Units/hr (03/24/16 1900)  . lactated ringers    . lactated ringers 20 mL/hr at 03/24/16 1900  . nitroGLYCERIN Stopped (03/24/16 1755)  . phenylephrine (NEO-SYNEPHRINE) Adult infusion Stopped (03/24/16 1606)     Lab Results  Component Value Date   WBC 8.0 03/24/2016   HGB 11.0 (L) 03/24/2016   HCT 33.4 (L) 03/24/2016   PLT 124 (L) 03/24/2016   GLUCOSE 122 (H) 03/24/2016   CHOL 179 11/03/2015   TRIG 178.0 (H) 11/03/2015   HDL 45.20 11/03/2015   LDLDIRECT 90.0 05/05/2015   LDLCALC 98 11/03/2015   ALT 19 03/23/2016   AST 40 03/23/2016   NA 139 03/24/2016   K 3.8 03/24/2016   CL 103 03/24/2016   CREATININE 1.27 (H) 03/24/2016   BUN 13 03/24/2016   CO2 25 03/24/2016   TSH 1.74 11/03/2015   INR 1.49 03/24/2016   HGBA1C 7.6 (H) 03/01/2016   MICROALBUR 1.1 11/03/2015   Slowly waking up Not bleeding H/h ok Expected Acute  Blood - loss Anemia Grace Isaac MD  Beeper [phone] Office [phone] 03/24/2016 7:20 PM

## 2016-03-24 NOTE — Anesthesia Procedure Notes (Signed)
Central Venous Catheter Insertion Performed by: anesthesiologist Patient location: Pre-op. Preanesthetic checklist: patient identified, IV checked, site marked, risks and benefits discussed, surgical consent, monitors and equipment checked, pre-op evaluation, timeout performed and anesthesia consent Landmarks identified PA cath was placed.Swan type and PA catheter depth:thermodilation and 40PA Cath depth:40 Procedure performed using ultrasound guided technique. Attempts: 1 Patient tolerated the procedure well with no immediate complications.

## 2016-03-24 NOTE — Anesthesia Postprocedure Evaluation (Signed)
Anesthesia Post Note  Patient: Andrea Wilkinson  Procedure(s) Performed: Procedure(s) (LRB): CORONARY ARTERY BYPASS GRAFTING (CABG) x 4 utilizing left internal mammary artery and left greater saphenous vein, bilateral saphenous veins harvested endoscopicaly,  SVG to PD, LIMA  to LAD, SEQ SVG to OM1 and OM2 (N/A) TRANSESOPHAGEAL ECHOCARDIOGRAM (TEE) (N/A)  Patient location during evaluation: SICU Anesthesia Type: General Level of consciousness: patient remains intubated per anesthesia plan Pain management: pain level controlled Vital Signs Assessment: post-procedure vital signs reviewed and stable Respiratory status: patient on ventilator - see flowsheet for VS and patient remains intubated per anesthesia plan Cardiovascular status: blood pressure returned to baseline Anesthetic complications: no    Last Vitals:  Vitals:   03/24/16 1715 03/24/16 1730  BP: 110/69 99/61  Pulse: 89 89  Resp: 15 13  Temp: 36.8 C 36.8 C    Last Pain:  Vitals:   03/24/16 1700  TempSrc: Core (Comment)  PainSc:                  Andrea Wilkinson

## 2016-03-24 NOTE — Anesthesia Procedure Notes (Signed)
Procedure Name: Intubation Date/Time: 03/24/2016 7:52 AM Performed by: Oletta Lamas Pre-anesthesia Checklist: Patient identified, Emergency Drugs available, Suction available and Patient being monitored Patient Re-evaluated:Patient Re-evaluated prior to inductionOxygen Delivery Method: Circle System Utilized Preoxygenation: Pre-oxygenation with 100% oxygen Intubation Type: IV induction Ventilation: Mask ventilation without difficulty Laryngoscope Size: Miller and 2 Tube type: Oral Number of attempts: 1 Airway Equipment and Method: Stylet Placement Confirmation: ETT inserted through vocal cords under direct vision,  positive ETCO2 and breath sounds checked- equal and bilateral Secured at: 22 cm Tube secured with: Tape Dental Injury: Teeth and Oropharynx as per pre-operative assessment

## 2016-03-24 NOTE — Progress Notes (Signed)
Arterial line positional, has whip in waveform. Attempted to manipulate, unsuccessful. MD aware. Cuff pressures cycling every 15 minutes.

## 2016-03-24 NOTE — Brief Op Note (Signed)
03/22/2016 - 03/24/2016  1:12 PM  PATIENT:  Andrea Wilkinson  82 y.o. female  PRE-OPERATIVE DIAGNOSIS:  CAD  POST-OPERATIVE DIAGNOSIS:  CAD  PROCEDURE:  Procedure(s): CORONARY ARTERY BYPASS GRAFTING (CABG) x 4 utilizing left internal mammary artery and left greater saphenous vein, bilateral saphenous veins harvested endoscopicaly (N/A) TRANSESOPHAGEAL ECHOCARDIOGRAM (TEE) (N/A)  SURGEON:  Surgeon(s) and Role:    * Grace Isaac, MD - Primary  PHYSICIAN ASSISTANT:  Nicholes Rough, PA-C  ANESTHESIA:   general  EBL:  Total I/O In: 2000 [I.V.:2000] Out: 750 [Urine:750]  BLOOD ADMINISTERED:none  DRAINS: routine   LOCAL MEDICATIONS USED:  NONE  SPECIMEN:  No Specimen and Lumpectomy with ALND  DISPOSITION OF SPECIMEN:  N/A  COUNTS:  YES  TOURNIQUET:  * No tourniquets in log *  DICTATION: .Dragon Dictation  PLAN OF CARE: Admit to inpatient   PATIENT DISPOSITION:  ICU - intubated and hemodynamically stable.

## 2016-03-24 NOTE — Progress Notes (Signed)
RT completed a recruitment maneuver due to pt's sat at 90%.  Sat improved to 93%.  RN aware.

## 2016-03-24 NOTE — Progress Notes (Signed)
Patient transported to OR holding via stretcher. Hand off to anesthesia provider at bedside. All belongings, including dentures and glasses, taken to 2S.

## 2016-03-24 NOTE — Procedures (Signed)
Extubation Procedure Note  Patient Details:   Name: Andrea Wilkinson DOB: 1931-08-29 MRN: [id number]   Airway Documentation:     Evaluation  O2 sats: stable throughout Complications: No apparent complications Patient did tolerate procedure well. Bilateral Breath Sounds: Clear   Yes   Patient extubated to a 4LNC with sats of 95%. Patient has adequate cough and is able to speak clearly.  Blanchie Serve 03/24/2016, 10:50 PM

## 2016-03-24 NOTE — Care Management Note (Signed)
Case Management Note Original Note Created by Elissa Hefty 03/23/16  Patient Details  Name: Andrea Wilkinson MRN: [id number] Date of Birth: 1931-11-16  Subjective/Objective:         Adm w mi           Action/Plan: lives at home   Expected Discharge Date:   (unknown)               Expected Discharge Plan:  Home/Self Care  In-House Referral:     Discharge planning Services     Post Acute Care Choice:    Choice offered to:     DME Arranged:    DME Agency:     HH Arranged:    [place] Agency:     Status of Service:  In process, will continue to follow  If discussed at Long Length of Stay Meetings, dates discussed:    Additional Comments:monitering for dc needs as pt progresses. 03/24/2016 S/p CABG [name], RN 03/24/2016, 4:10 PM

## 2016-03-24 NOTE — Progress Notes (Signed)
  Echocardiogram 2D Echocardiogram has been performed.  [name] 03/24/2016, 8:31 AM

## 2016-03-24 NOTE — Transfer of Care (Signed)
Immediate Anesthesia Transfer of Care Note  Patient: Andrea Wilkinson  Procedure(s) Performed: Procedure(s): CORONARY ARTERY BYPASS GRAFTING (CABG) x 4 utilizing left internal mammary artery and left greater saphenous vein, bilateral saphenous veins harvested endoscopicaly,  SVG to PD, LIMA  to LAD, SEQ SVG to OM1 and OM2 (N/A) TRANSESOPHAGEAL ECHOCARDIOGRAM (TEE) (N/A)  Patient Location: SICU  Anesthesia Type:General  Level of Consciousness: Patient remains intubated per anesthesia plan  Airway & Oxygen Therapy: Patient placed on Ventilator (see vital sign flow sheet for setting)  Post-op Assessment: Report given to RN and Post -op Vital signs reviewed and stable  Post vital signs: Reviewed and stable  Last Vitals:  Vitals:   03/24/16 0502 03/24/16 0600  BP: (!) 157/80 (!) 149/83  Pulse: 63 66  Resp: 16 16  Temp:      Last Pain:  Vitals:   03/24/16 0400  TempSrc: Axillary  PainSc:          Complications: No apparent anesthesia complications   Report included:VS,  drips, pressures, outputs, inputs, overview of procedure.

## 2016-03-25 ENCOUNTER — Inpatient Hospital Stay (HOSPITAL_COMMUNITY): Payer: Commercial Managed Care - HMO

## 2016-03-25 LAB — TYPE AND SCREEN
ABO/RH(D): A POS
Antibody Screen: NEGATIVE
Unit division: 0
Unit division: 0

## 2016-03-25 LAB — CBC
HEMATOCRIT: 33.7 % — AB (ref 36.0–46.0)
HEMATOCRIT: 33.5 % — AB (ref 36.0–46.0)
Hemoglobin: 10.7 g/dL — ABNORMAL LOW (ref 12.0–15.0)
Hemoglobin: 10.7 g/dL — ABNORMAL LOW (ref 12.0–15.0)
MCH: 28.3 pg (ref 26.0–34.0)
MCH: 28.8 pg (ref 26.0–34.0)
MCHC: 31.8 g/dL (ref 30.0–36.0)
MCHC: 31.9 g/dL (ref 30.0–36.0)
MCV: 89.2 fL (ref 78.0–100.0)
MCV: 90.1 fL (ref 78.0–100.0)
Platelets: 148 10*3/uL — ABNORMAL LOW (ref 150–400)
Platelets: 149 10*3/uL — ABNORMAL LOW (ref 150–400)
RBC: 3.78 MIL/uL — AB (ref 3.87–5.11)
RBC: 3.72 MIL/uL — ABNORMAL LOW (ref 3.87–5.11)
RDW: 15.4 % (ref 11.5–15.5)
RDW: 15.4 % (ref 11.5–15.5)
WBC: 11.3 10*3/uL — AB (ref 4.0–10.5)
WBC: 11.3 10*3/uL — ABNORMAL HIGH (ref 4.0–10.5)

## 2016-03-25 LAB — GLUCOSE, CAPILLARY
GLUCOSE-CAPILLARY: 104 mg/dL — AB (ref 65–99)
GLUCOSE-CAPILLARY: 107 mg/dL — AB (ref 65–99)
GLUCOSE-CAPILLARY: 110 mg/dL — AB (ref 65–99)
GLUCOSE-CAPILLARY: 120 mg/dL — AB (ref 65–99)
GLUCOSE-CAPILLARY: 121 mg/dL — AB (ref 65–99)
GLUCOSE-CAPILLARY: 127 mg/dL — AB (ref 65–99)
GLUCOSE-CAPILLARY: 149 mg/dL — AB (ref 65–99)
GLUCOSE-CAPILLARY: 149 mg/dL — AB (ref 65–99)
GLUCOSE-CAPILLARY: 169 mg/dL — AB (ref 65–99)
GLUCOSE-CAPILLARY: 184 mg/dL — AB (ref 65–99)
GLUCOSE-CAPILLARY: 216 mg/dL — AB (ref 65–99)
GLUCOSE-CAPILLARY: 57 mg/dL — AB (ref 65–99)
GLUCOSE-CAPILLARY: 90 mg/dL (ref 65–99)
Glucose-Capillary: 116 mg/dL — ABNORMAL HIGH (ref 65–99)
Glucose-Capillary: 102 mg/dL — ABNORMAL HIGH (ref 65–99)
Glucose-Capillary: 115 mg/dL — ABNORMAL HIGH (ref 65–99)
Glucose-Capillary: 118 mg/dL — ABNORMAL HIGH (ref 65–99)
Glucose-Capillary: 96 mg/dL (ref 65–99)
Glucose-Capillary: 127 mg/dL — ABNORMAL HIGH (ref 65–99)
Glucose-Capillary: 184 mg/dL — ABNORMAL HIGH (ref 65–99)

## 2016-03-25 LAB — BASIC METABOLIC PANEL
Anion gap: 9 (ref 5–15)
BUN: 9 mg/dL (ref 6–20)
CALCIUM: 8 mg/dL — AB (ref 8.9–10.3)
CO2: 24 mmol/L (ref 22–32)
CREATININE: 1 mg/dL (ref 0.44–1.00)
Chloride: 103 mmol/L (ref 101–111)
GFR calc non Af Amer: 51 mL/min — ABNORMAL LOW (ref >60)
GFR, EST AFRICAN AMERICAN: 59 mL/min — AB (ref >60)
Glucose, Bld: 136 mg/dL — ABNORMAL HIGH (ref 65–99)
Potassium: 3.8 mmol/L (ref 3.5–5.1)
SODIUM: 136 mmol/L (ref 135–145)

## 2016-03-25 LAB — MAGNESIUM
MAGNESIUM: 2.6 mg/dL — AB (ref 1.7–2.4)
MAGNESIUM: 2.7 mg/dL — AB (ref 1.7–2.4)

## 2016-03-25 LAB — POCT I-STAT, CHEM 8
BUN: 12 mg/dL (ref 6–20)
CHLORIDE: 100 mmol/L — AB (ref 101–111)
Calcium, Ion: 1.11 mmol/L — ABNORMAL LOW (ref 1.15–1.40)
Creatinine, Ser: 1.1 mg/dL — ABNORMAL HIGH (ref 0.44–1.00)
Glucose, Bld: 177 mg/dL — ABNORMAL HIGH (ref 65–99)
HEMATOCRIT: 32 % — AB (ref 36.0–46.0)
HEMOGLOBIN: 10.9 g/dL — AB (ref 12.0–15.0)
POTASSIUM: 3.9 mmol/L (ref 3.5–5.1)
SODIUM: 138 mmol/L (ref 135–145)
TCO2: 23 mmol/L (ref 0–100)

## 2016-03-25 LAB — CREATININE, SERUM
CREATININE: 1.19 mg/dL — AB (ref 0.44–1.00)
GFR calc Af Amer: 48 mL/min — ABNORMAL LOW (ref >60)
GFR, EST NON AFRICAN AMERICAN: 41 mL/min — AB (ref >60)

## 2016-03-25 LAB — POCT I-STAT 3, ART BLOOD GAS (G3+)
ACID-BASE DEFICIT: 1 mmol/L (ref 0.0–2.0)
Bicarbonate: 23.4 mmol/L (ref 20.0–28.0)
O2 SAT: 99 %
PO2 ART: 134 mmHg — AB (ref 83.0–108.0)
Patient temperature: 37.9
TCO2: 24 mmol/L (ref 0–100)
pCO2 arterial: 38.2 mmHg (ref 32.0–48.0)
pH, Arterial: 7.398 (ref 7.350–7.450)

## 2016-03-25 LAB — HEMOGLOBIN A1C
Hgb A1c MFr Bld: 7.3 % — ABNORMAL HIGH (ref 4.8–5.6)
Mean Plasma Glucose: 163 mg/dL

## 2016-03-25 MED ORDER — FUROSEMIDE 10 MG/ML IJ SOLN
20.0000 mg | Freq: Once | INTRAMUSCULAR | Status: AC
  Administered 2016-03-25: 20 mg via INTRAVENOUS
  Filled 2016-03-25: qty 2

## 2016-03-25 MED ORDER — INSULIN DETEMIR 100 UNIT/ML ~~LOC~~ SOLN
15.0000 [IU] | Freq: Every day | SUBCUTANEOUS | Status: DC
  Administered 2016-03-26 – 2016-03-30 (×5): 15 [IU] via SUBCUTANEOUS
  Filled 2016-03-25 (×5): qty 0.15

## 2016-03-25 MED ORDER — OXYCODONE HCL 5 MG PO TABS
5.0000 mg | ORAL_TABLET | ORAL | Status: DC | PRN
  Administered 2016-03-26 (×2): 5 mg via ORAL
  Filled 2016-03-25 (×2): qty 1

## 2016-03-25 MED ORDER — ORAL CARE MOUTH RINSE: 15.0000 mL | Freq: Two times a day (BID) | OROMUCOSAL | Status: DC

## 2016-03-25 MED ORDER — TRAMADOL HCL 50 MG PO TABS
50.0000 mg | ORAL_TABLET | ORAL | Status: DC | PRN
  Administered 2016-03-25 – 2016-03-26 (×2): 50 mg via ORAL
  Filled 2016-03-25 (×2): qty 1

## 2016-03-25 MED ORDER — ENOXAPARIN SODIUM 30 MG/0.3ML ~~LOC~~ SOLN: 30.0000 mg | Freq: Every day | SUBCUTANEOUS | Status: DC

## 2016-03-25 MED ORDER — INSULIN ASPART 100 UNIT/ML ~~LOC~~ SOLN
0.0000 [IU] | SUBCUTANEOUS | Status: DC
  Administered 2016-03-25: 4 [IU] via SUBCUTANEOUS
  Administered 2016-03-25: 8 [IU] via SUBCUTANEOUS
  Administered 2016-03-26: 4 [IU] via SUBCUTANEOUS
  Administered 2016-03-26: 2 [IU] via SUBCUTANEOUS
  Administered 2016-03-26 (×2): 4 [IU] via SUBCUTANEOUS
  Administered 2016-03-26: 8 [IU] via SUBCUTANEOUS
  Administered 2016-03-27: 12 [IU] via SUBCUTANEOUS
  Administered 2016-03-27: 8 [IU] via SUBCUTANEOUS
  Administered 2016-03-27: 2 [IU] via SUBCUTANEOUS
  Administered 2016-03-27 (×2): 4 [IU] via SUBCUTANEOUS
  Administered 2016-03-27 – 2016-03-28 (×2): 2 [IU] via SUBCUTANEOUS

## 2016-03-25 MED ORDER — INSULIN DETEMIR 100 UNIT/ML ~~LOC~~ SOLN
15.0000 [IU] | Freq: Once | SUBCUTANEOUS | Status: AC
  Administered 2016-03-25: 15 [IU] via SUBCUTANEOUS
  Filled 2016-03-25: qty 0.15

## 2016-03-25 NOTE — Progress Notes (Signed)
Patient ID: Andrea Wilkinson, female   DOB: Apr 20, 1932, 82 y.o.   MRN: [id number] EVENING ROUNDS NOTE :     [address]       [place] [address]             [phone]                 1 Day Post-Op Procedure(s) (LRB): CORONARY ARTERY BYPASS GRAFTING (CABG) x 4 utilizing left internal mammary artery and left greater saphenous vein, bilateral saphenous veins harvested endoscopicaly,  SVG to PD, LIMA  to LAD, SEQ SVG to OM1 and OM2 (N/A) TRANSESOPHAGEAL ECHOCARDIOGRAM (TEE) (N/A)  Total Length of Stay:  LOS: 3 days  BP (!) 147/77   Pulse 84   Temp 97.9 F (36.6 C) (Oral)   Resp 17   Ht 5' (1.524 m)   Wt 161 lb 6 oz (73.2 kg)   SpO2 94%   BMI 31.52 kg/m   .Intake/Output      10/07 0701 - 10/08 0700   P.O. 240   I.V. (mL/kg) 331.6 (4.5)   Blood    Other    NG/GT    IV Piggyback    Total Intake(mL/kg) 571.6 (7.8)   Urine (mL/kg/hr) 370 (0.4)   Emesis/NG output    Blood    Chest Tube 110 (0.1)   Total Output 480   Net +91.6         . sodium chloride    . lactated ringers 20 mL/hr at 03/24/16 1900  . phenylephrine (NEO-SYNEPHRINE) Adult infusion Stopped (03/24/16 1606)     Lab Results  Component Value Date   WBC 11.3 (H) 03/25/2016   HGB 10.9 (L) 03/25/2016   HCT 32.0 (L) 03/25/2016   PLT 149 (L) 03/25/2016   GLUCOSE 177 (H) 03/25/2016   CHOL 179 11/03/2015   TRIG 178.0 (H) 11/03/2015   HDL 45.20 11/03/2015   LDLDIRECT 90.0 05/05/2015   LDLCALC 98 11/03/2015   ALT 19 03/23/2016   AST 40 03/23/2016   NA 138 03/25/2016   K 3.9 03/25/2016   CL 100 (L) 03/25/2016   CREATININE 1.10 (H) 03/25/2016   BUN 12 03/25/2016   CO2 24 03/25/2016   TSH 1.74 11/03/2015   INR 1.49 03/24/2016   HGBA1C 7.3 (H) 03/24/2016   MICROALBUR 1.1 11/03/2015  stable day Mild diuresis Stable day   Grace Isaac MD  Beeper [phone] Office [phone] 03/25/2016 7:47 PM

## 2016-03-25 NOTE — Progress Notes (Signed)
Patient ID: Andrea Wilkinson, female   DOB: 1932/04/13, 82 y.o.   MRN: [id number] TCTS DAILY ICU PROGRESS NOTE                   [address]            [place] [address]          [phone]   1 Day Post-Op Procedure(s) (LRB): CORONARY ARTERY BYPASS GRAFTING (CABG) x 4 utilizing left internal mammary artery and left greater saphenous vein, bilateral saphenous veins harvested endoscopicaly,  SVG to PD, LIMA  to LAD, SEQ SVG to OM1 and OM2 (N/A) TRANSESOPHAGEAL ECHOCARDIOGRAM (TEE) (N/A)  Total Length of Stay:  LOS: 3 days   Subjective: Extubated, alert , good pain control Objective: Vital signs in last 24 hours: Temp:  [97.5 F (36.4 C)-100.2 F (37.9 C)] 99.7 F (37.6 C) (10/07 1100) Pulse Rate:  [79-103] 79 (10/07 1100) Cardiac Rhythm: Normal sinus rhythm (10/07 0800) Resp:  [9-21] 17 (10/07 1100) BP: (99-144)/(58-108) 108/63 (10/07 1100) SpO2:  [91 %-100 %] 100 % (10/07 1100) Arterial Line BP: (112-180)/(58-114) 180/75 (10/07 1100) FiO2 (%):  [40 %-50 %] 40 % (10/06 2200) Weight:  [161 lb 6 oz (73.2 kg)] 161 lb 6 oz (73.2 kg) (10/07 0545)  Filed Weights   03/23/16 0452 03/24/16 0448 03/25/16 0545  Weight: 147 lb 7.8 oz (66.9 kg) 148 lb 5.9 oz (67.3 kg) 161 lb 6 oz (73.2 kg)    Weight change: 13 lb 0.1 oz (5.9 kg)   Hemodynamic parameters for last 24 hours: PAP: (14-31)/(3-23) 19/11 CO:  [2.6 L/min-3.9 L/min] 3.3 L/min CI:  [1.6 L/min/m2-2.4 L/min/m2] 2 L/min/m2  Intake/Output from previous day: 10/06 0701 - 10/07 0700 In: 5401.3 [I.V.:3516.3; Blood:500; NG/GT:15; IV Piggyback:1350] Out: 5009 [Urine:3975; Emesis/NG output:50; Blood:1000; Chest Tube:290]  Intake/Output this shift: Total I/O In: 404.6 [P.O.:240; I.V.:164.6] Out: 225 [Urine:175; Chest Tube:50]  Current Meds: Scheduled Meds: . acetaminophen  1,000 mg Oral Q6H   Or  . acetaminophen (TYLENOL) oral liquid 160 mg/5 mL  1,000 mg Per Tube Q6H  . aspirin EC  325 mg Oral Daily   Or  .  aspirin  324 mg Per Tube Daily  . atorvastatin  40 mg Oral q1800  . bisacodyl  10 mg Oral Daily   Or  . bisacodyl  10 mg Rectal Daily  . cefUROXime (ZINACEF)  IV  1.5 g Intravenous Q12H  . chlorhexidine gluconate (MEDLINE KIT)  15 mL Mouth Rinse BID  . docusate sodium  200 mg Oral Daily  . insulin regular  0-10 Units Intravenous TID WC  . mouth rinse  15 mL Mouth Rinse QID  . metoCLOPramide (REGLAN) injection  10 mg Intravenous Q6H  . metoprolol tartrate  12.5 mg Oral BID   Or  . metoprolol tartrate  12.5 mg Per Tube BID  . [START ON 03/26/2016] pantoprazole  40 mg Oral Daily  . sodium chloride flush  3 mL Intravenous Q12H   Continuous Infusions: . sodium chloride 20 mL/hr at 03/24/16 1900  . sodium chloride    . sodium chloride    . dexmedetomidine Stopped (03/24/16 2145)  . DOPamine Stopped (03/25/16 3818)  . insulin (NOVOLIN-R) infusion 2.5 Units/hr (03/25/16 1114)  . lactated ringers    . lactated ringers 20 mL/hr at 03/24/16 1900  . nitroGLYCERIN Stopped (03/24/16 1755)  . phenylephrine (NEO-SYNEPHRINE) Adult infusion Stopped (03/24/16 1606)   PRN Meds:.sodium chloride, albumin human, lactated ringers, metoprolol, midazolam, morphine  injection, ondansetron (ZOFRAN) IV, oxyCODONE, sodium chloride flush, traMADol  General appearance: alert and cooperative Neurologic: intact Heart: regular rate and rhythm, S1, S2 normal, no murmur, click, rub or gallop Lungs: diminished breath sounds bibasilar Abdomen: soft, non-tender; bowel sounds normal; no masses,  no organomegaly Extremities: extremities normal, atraumatic, no cyanosis or edema and Homans sign is negative, no sign of DVT Wound: sternum stable  Lab Results: CBC: Recent Labs  03/24/16 2055 03/24/16 2208 03/25/16 0400  WBC 11.5*  --  11.3*  HGB 11.5* 11.2* 10.7*  HCT 35.9* 33.0* 33.7*  PLT 145*  --  148*   BMET:  Recent Labs  03/24/16 0209  03/24/16 2208 03/25/16 0400  NA 137  < > 139 136  K 4.1  < > 4.3  3.8  CL 103  --  103 103  CO2 25  --   --  24  GLUCOSE 165*  < > 122* 136*  BUN 13  --  12 9  CREATININE 1.27*  < > 0.90 1.00  CALCIUM 9.6  --   --  8.0*  < > = values in this interval not displayed.  CMET: Lab Results  Component Value Date   WBC 11.3 (H) 03/25/2016   HGB 10.7 (L) 03/25/2016   HCT 33.7 (L) 03/25/2016   PLT 148 (L) 03/25/2016   GLUCOSE 136 (H) 03/25/2016   CHOL 179 11/03/2015   TRIG 178.0 (H) 11/03/2015   HDL 45.20 11/03/2015   LDLDIRECT 90.0 05/05/2015   LDLCALC 98 11/03/2015   ALT 19 03/23/2016   AST 40 03/23/2016   NA 136 03/25/2016   K 3.8 03/25/2016   CL 103 03/25/2016   CREATININE 1.00 03/25/2016   BUN 9 03/25/2016   CO2 24 03/25/2016   TSH 1.74 11/03/2015   INR 1.49 03/24/2016   HGBA1C 7.3 (H) 03/24/2016   MICROALBUR 1.1 11/03/2015    PT/INR:  Recent Labs  03/24/16 1550  LABPROT 18.1*  INR 1.49   Radiology: Dg Chest Port 1 View  Result Date: 03/25/2016 CLINICAL DATA:  Status post coronary artery bypass grafting. Chest tube in place. EXAM: PORTABLE CHEST 1 VIEW COMPARISON:  March 24, 2016. FINDINGS: Endotracheal tube and nasogastric tube have been removed. Swan-Ganz catheter tip is in the main pulmonary outflow tract. Chest tube and mediastinal drain remain in place. No pneumothorax evident. There is mild atelectasis in both lower lung zones. There is no frank edema or consolidation. The heart size and pulmonary vascularity are normal. No adenopathy. There is evidence of old trauma involving the right mid clavicle, stable. IMPRESSION: Tube and catheter positions as described without pneumothorax. Mild lower lobe atelectasis bilaterally. No edema or consolidation. Stable cardiac silhouette. Electronically Signed   By: Lowella Grip III M.D.   On: 03/25/2016 09:02   Dg Chest Port 1 View  Result Date: 03/24/2016 CLINICAL DATA:  Post CABG x4 EXAM: PORTABLE CHEST 1 VIEW COMPARISON:  03/22/2016 FINDINGS: Endotracheal tube tip is seen 4.2 cm above  the carina in satisfactory position. Gastric tube is seen below the level of the diaphragm with both active and side ports in the expected location of the stomach. Median sternotomy sutures are noted from recent CABG. Left-sided chest tube without pneumothorax noted. Tip of the tube extends to the posterior fourth rib level. Pulmonary artery catheter projects over the mediastinum at the level of the right mainstem bronchus. Unnamed catheter tubing also projects over the right upper quadrant extending to the right heart. The heart is top-normal in  size. There is left lower lobe atelectasis. No focal consolidation. Mild vascular congestion. IMPRESSION: Support lines and tubes as above. No pneumothorax with left-sided chest tube in place. No pulmonary consolidation. Mild vascular congestion. Electronically Signed   By: Ashley Royalty M.D.   On: 03/24/2016 16:04     Assessment/Plan: S/P Procedure(s) (LRB): CORONARY ARTERY BYPASS GRAFTING (CABG) x 4 utilizing left internal mammary artery and left greater saphenous vein, bilateral saphenous veins harvested endoscopicaly,  SVG to PD, LIMA  to LAD, SEQ SVG to OM1 and OM2 (N/A) TRANSESOPHAGEAL ECHOCARDIOGRAM (TEE) (N/A) Mobilize Diuresis Diabetes control d/c tubes/lines Continue foley due to strict I&O, patient in ICU and urinary output monitoring See progression orders Expected Acute  Blood - loss Anemia renal function stable    Grace Isaac 03/25/2016 11:52 AM

## 2016-03-26 ENCOUNTER — Inpatient Hospital Stay (HOSPITAL_COMMUNITY): Payer: Commercial Managed Care - HMO

## 2016-03-26 LAB — GLUCOSE, CAPILLARY
GLUCOSE-CAPILLARY: 139 mg/dL — AB (ref 65–99)
GLUCOSE-CAPILLARY: 165 mg/dL — AB (ref 65–99)
GLUCOSE-CAPILLARY: 220 mg/dL — AB (ref 65–99)
Glucose-Capillary: 65 mg/dL (ref 65–99)
Glucose-Capillary: 76 mg/dL (ref 65–99)
Glucose-Capillary: 167 mg/dL — ABNORMAL HIGH (ref 65–99)
Glucose-Capillary: 200 mg/dL — ABNORMAL HIGH (ref 65–99)

## 2016-03-26 LAB — CBC
HCT: 33.2 % — ABNORMAL LOW (ref 36.0–46.0)
Hemoglobin: 10.3 g/dL — ABNORMAL LOW (ref 12.0–15.0)
MCH: 28.1 pg (ref 26.0–34.0)
MCHC: 31 g/dL (ref 30.0–36.0)
MCV: 90.5 fL (ref 78.0–100.0)
Platelets: 161 10*3/uL (ref 150–400)
RBC: 3.67 MIL/uL — ABNORMAL LOW (ref 3.87–5.11)
RDW: 15.5 % (ref 11.5–15.5)
WBC: 13.2 10*3/uL — ABNORMAL HIGH (ref 4.0–10.5)

## 2016-03-26 LAB — BASIC METABOLIC PANEL
Anion gap: 6 (ref 5–15)
BUN: 12 mg/dL (ref 6–20)
CO2: 27 mmol/L (ref 22–32)
Calcium: 8.6 mg/dL — ABNORMAL LOW (ref 8.9–10.3)
Chloride: 105 mmol/L (ref 101–111)
Creatinine, Ser: 1.18 mg/dL — ABNORMAL HIGH (ref 0.44–1.00)
GFR calc Af Amer: 48 mL/min — ABNORMAL LOW (ref >60)
GFR calc non Af Amer: 42 mL/min — ABNORMAL LOW (ref >60)
Glucose, Bld: 78 mg/dL (ref 65–99)
Potassium: 3.2 mmol/L — ABNORMAL LOW (ref 3.5–5.1)
Sodium: 138 mmol/L (ref 135–145)

## 2016-03-26 MED ORDER — ENOXAPARIN SODIUM 30 MG/0.3ML ~~LOC~~ SOLN
30.0000 mg | SUBCUTANEOUS | Status: DC
  Administered 2016-03-26 – 2016-03-27 (×2): 30 mg via SUBCUTANEOUS
  Filled 2016-03-26 (×2): qty 0.3

## 2016-03-26 MED ORDER — FUROSEMIDE 10 MG/ML IJ SOLN
20.0000 mg | Freq: Once | INTRAMUSCULAR | Status: AC
  Administered 2016-03-26: 20 mg via INTRAVENOUS
  Filled 2016-03-26: qty 2

## 2016-03-26 MED ORDER — POTASSIUM CHLORIDE 10 MEQ/50ML IV SOLN
10.0000 meq | INTRAVENOUS | Status: AC
  Administered 2016-03-26 (×3): 10 meq via INTRAVENOUS
  Filled 2016-03-26 (×3): qty 50

## 2016-03-26 NOTE — Progress Notes (Signed)
Patient ID: Andrea Wilkinson, female   DOB: 1931/07/08, 82 y.o.   MRN: [id number] TCTS DAILY ICU PROGRESS NOTE                   [address]            [place] [address]          [phone]   2 Days Post-Op Procedure(s) (LRB): CORONARY ARTERY BYPASS GRAFTING (CABG) x 4 utilizing left internal mammary artery and left greater saphenous vein, bilateral saphenous veins harvested endoscopicaly,  SVG to PD, LIMA  to LAD, SEQ SVG to OM1 and OM2 (N/A) TRANSESOPHAGEAL ECHOCARDIOGRAM (TEE) (N/A)  Total Length of Stay:  LOS: 4 days   Subjective:  Extubated, alert , good pain control, walked 150 feet this am  Objective: Vital signs in last 24 hours: Temp:  [97.4 F (36.3 C)-99.9 F (37.7 C)] 98.1 F (36.7 C) (10/08 0842) Pulse Rate:  [73-91] 87 (10/08 0900) Cardiac Rhythm: Normal sinus rhythm (10/08 0800) Resp:  [13-19] 18 (10/08 0900) BP: (108-188)/(63-142) 138/75 (10/08 0900) SpO2:  [88 %-100 %] 96 % (10/08 0900) Arterial Line BP: (165-180)/(75) 165/75 (10/07 1200) Weight:  [158 lb 8.2 oz (71.9 kg)] 158 lb 8.2 oz (71.9 kg) (10/08 0500)  Filed Weights   03/24/16 0448 03/25/16 0545 03/26/16 0500  Weight: 148 lb 5.9 oz (67.3 kg) 161 lb 6 oz (73.2 kg) 158 lb 8.2 oz (71.9 kg)    Weight change: -2 lb 13.9 oz (-1.3 kg)   Hemodynamic parameters for last 24 hours:    Intake/Output from previous day: 10/07 0701 - 10/08 0700 In: 861.6 [P.O.:240; I.V.:571.6; IV Piggyback:50] Out: 2200 [Urine:2060; Chest Tube:140]  Intake/Output this shift: Total I/O In: 90 [I.V.:40; IV Piggyback:50] Out: 195 [Urine:185; Chest Tube:10]  Current Meds: Scheduled Meds: . acetaminophen  1,000 mg Oral Q6H   Or  . acetaminophen (TYLENOL) oral liquid 160 mg/5 mL  1,000 mg Per Tube Q6H  . aspirin EC  325 mg Oral Daily   Or  . aspirin  324 mg Per Tube Daily  . atorvastatin  40 mg Oral q1800  . bisacodyl  10 mg Oral Daily   Or  . bisacodyl  10 mg Rectal Daily  . cefUROXime (ZINACEF)  IV   1.5 g Intravenous Q12H  . chlorhexidine gluconate (MEDLINE KIT)  15 mL Mouth Rinse BID  . docusate sodium  200 mg Oral Daily  . enoxaparin (LOVENOX) injection  30 mg Subcutaneous Q24H  . furosemide  20 mg Intravenous Once  . insulin aspart  0-24 Units Subcutaneous Q4H  . insulin detemir  15 Units Subcutaneous Daily  . mouth rinse  15 mL Mouth Rinse QID  . metoCLOPramide (REGLAN) injection  10 mg Intravenous Q6H  . metoprolol tartrate  12.5 mg Oral BID   Or  . metoprolol tartrate  12.5 mg Per Tube BID  . pantoprazole  40 mg Oral Daily  . sodium chloride flush  3 mL Intravenous Q12H   Continuous Infusions: . sodium chloride    . lactated ringers 20 mL/hr at 03/24/16 1900   PRN Meds:.lactated ringers, metoprolol, ondansetron (ZOFRAN) IV, oxyCODONE, sodium chloride flush, traMADol  General appearance: alert and cooperative Neurologic: intact Heart: regular rate and rhythm, S1, S2 normal, no murmur, click, rub or gallop Lungs: diminished breath sounds bibasilar Abdomen: soft, non-tender; bowel sounds normal; no masses,  no organomegaly Extremities: extremities normal, atraumatic, no cyanosis or edema and Homans sign is negative, no sign  of DVT Wound: sternum stable  Lab Results: CBC:  Recent Labs  03/25/16 1635 03/25/16 1637 03/26/16 0426  WBC 11.3*  --  13.2*  HGB 10.7* 10.9* 10.3*  HCT 33.5* 32.0* 33.2*  PLT 149*  --  161   BMET:  Recent Labs  03/25/16 0400  03/25/16 1637 03/26/16 0426  NA 136  --  138 138  K 3.8  --  3.9 3.2*  CL 103  --  100* 105  CO2 24  --   --  27  GLUCOSE 136*  --  177* 78  BUN 9  --  12 12  CREATININE 1.00  < > 1.10* 1.18*  CALCIUM 8.0*  --   --  8.6*  < > = values in this interval not displayed.  CMET: Lab Results  Component Value Date   WBC 13.2 (H) 03/26/2016   HGB 10.3 (L) 03/26/2016   HCT 33.2 (L) 03/26/2016   PLT 161 03/26/2016   GLUCOSE 78 03/26/2016   CHOL 179 11/03/2015   TRIG 178.0 (H) 11/03/2015   HDL 45.20 11/03/2015    LDLDIRECT 90.0 05/05/2015   LDLCALC 98 11/03/2015   ALT 19 03/23/2016   AST 40 03/23/2016   NA 138 03/26/2016   K 3.2 (L) 03/26/2016   CL 105 03/26/2016   CREATININE 1.18 (H) 03/26/2016   BUN 12 03/26/2016   CO2 27 03/26/2016   TSH 1.74 11/03/2015   INR 1.49 03/24/2016   HGBA1C 7.3 (H) 03/24/2016   MICROALBUR 1.1 11/03/2015    PT/INR:   Recent Labs  03/24/16 1550  LABPROT 18.1*  INR 1.49   Radiology: Dg Chest Port 1 View  Result Date: 03/26/2016 CLINICAL DATA:  Continued surveillance post CABG. EXAM: PORTABLE CHEST 1 VIEW COMPARISON:  Multiple priors. FINDINGS: Cardiomegaly persists. Swan-Ganz catheter has been removed. Swan-Ganz introducer kinked. LEFT chest tube remains. LEFT basilar subsegmental atelectasis. No pneumothorax. No edema or consolidation. IMPRESSION: Improved aeration.  LEFT chest tube remains.  No pneumothorax. Electronically Signed   By: Staci Righter M.D.   On: 03/26/2016 08:54     Assessment/Plan: S/P Procedure(s) (LRB): CORONARY ARTERY BYPASS GRAFTING (CABG) x 4 utilizing left internal mammary artery and left greater saphenous vein, bilateral saphenous veins harvested endoscopicaly,  SVG to PD, LIMA  to LAD, SEQ SVG to OM1 and OM2 (N/A) TRANSESOPHAGEAL ECHOCARDIOGRAM (TEE) (N/A) Mobilize Diuresis Diabetes control d/c tubes/lines D/c foley Expected Acute  Blood - loss Anemia renal function stable    Grace Isaac 03/26/2016 9:40 AMPatient ID: Andrea Wilkinson, female   DOB: 1931-11-28, 82 y.o.   MRN: [id number]

## 2016-03-26 NOTE — Plan of Care (Signed)
Problem: Activity: Goal: Risk for activity intolerance will decrease Outcome: Progressing Pt oob x3  Problem: Bowel/Gastric: Goal: Gastrointestinal status for postoperative course will improve Outcome: Progressing Active BS. No BM yet  Problem: Cardiac: Goal: Hemodynamic stability will improve Outcome: Progressing NSR on no drips.  Problem: Respiratory: Goal: Ability to tolerate decreased levels of ventilator support will improve Outcome: Completed/Met Date Met: 03/26/16 Extubated 03/24/16  Problem: Urinary Elimination: Goal: Ability to achieve and maintain adequate renal perfusion and functioning will improve Outcome: Progressing UOP wnl

## 2016-03-26 NOTE — Op Note (Signed)
NAME:  Andrea Wilkinson, Andrea Wilkinson                 ACCOUNT NO.:  [id number]  MEDICAL RECORD NO.:  [id number]  LOCATION:  2S16C                        FACILITY:  [facility]  PHYSICIAN:  Lanelle Bal, MD    DATE OF BIRTH:  December 18, 1931  DATE OF PROCEDURE:  03/24/2016 DATE OF DISCHARGE:                              OPERATIVE REPORT   POSTOPERATIVE DIAGNOSIS:  Coronary occlusive disease with unstable angina and non-ST-elevation myocardial infarction.  POSTOPERATIVE DIAGNOSIS:  Coronary occlusive disease with unstable angina and non-ST-elevation myocardial infarction.  SURGICAL PROCEDURE:  Coronary artery bypass grafting x4 with left internal mammary to the left anterior descending coronary artery, sequential reverse saphenous vein graft to the first and second obtuse marginal coronary artery, reverse saphenous vein graft to the posterior descending coronary artery arising from the distal circumflex with right greater saphenous thigh and calf Endo vein harvesting and left thigh greater saphenous harvesting endoscopically.  SURGEON:  Lanelle Bal, MD.  FIRST ASSISTANT:  Shaaron Adler.  BRIEF HISTORY:  The patient is an 82 year old female, who had several months of increasing chest discomfort, was waiting for Cardiology and GI evaluation.  In the meantime, had prolonged episodes of pain, came to the emergency room, was admitted with a non-ST-elevation myocardial infarction.  She underwent cardiac catheterization by Dr. Irish Lack, which demonstrated severe three-vessel coronary artery disease with a small nondominant right coronary with posterior descending arising from the distal circumflex.  The LAD was very diffusely diseased with areas of greater than 90% stenosis.  The circumflex had 2 obtuse marginal branches of moderate size, with greater than 80% stenosis.  The patient had mild aortic insufficiency and mild mitral insufficiency with depressed LV function with by ventriculogram, 40% ejection  fraction with anterior apical hypokinesis.  Risks and options for concerning treatment were discussed with the patient.  Coronary artery bypass grafting was recommended because of the severe three-vessel nature of her coronary artery disease, not amenable to angioplasty.  The patient was agreeable and signed informed consent.  DESCRIPTION OF PROCEDURE:  With Swan-Ganz and arterial line monitors in place the patient underwent general endotracheal anesthesia without incident.  Skin of chest and legs was prepped with Betadine and draped in the usual sterile manner.  Appropriate time-out was performed.  We then proceeded first with endoscopic vein harvesting of the right greater saphenous vein from the thigh and calf.  The thigh portion of the vein was of reasonable quality.  The lower calf portion of the vein was extremely small and not suitable for bypass.  Additional vein was harvested endoscopically, removing the left greater saphenous vein from the left thigh.  A median sternotomy was performed.  Left internal mammary artery was dissected down as a pedicle graft.  The distal artery was divided and had good free flow, although it was small.  The patient was systemically heparinized.  The ascending aorta was cannulated.  The right atrium was cannulated with a double venous cannula.  The patient was placed on cardiopulmonary bypass 2.4 L/min/m2.  Sites of anastomosis were selected and dissected at the epicardium.  As noted, the patient's coronaries were very diffusely disease, especially the LAD.  The patient's body temperature was cooled to 32  degrees.  Aortic crossclamp was applied, 500 mL of cold blood potassium cardioplegia was administered with diastolic arrest of the heart.  Myocardial septal temp was monitored throughout the crossclamp.  Attention was turned first to the posterior descending coronary artery, which was relatively small vessel, but opened and admitted a 1 mm probe  distally.  Using a running 7-0 Prolene, distal anastomosis was performed with a second reverse saphenous vein graft.  The heart was then elevated and first and second obtuse marginal vessels were identified.  The OM1 vessel was opened.  It was a thin-walled vessel, approximately 1.2-1.3 mm in size.  Using a diamond-type side-to-side anastomosis with a running 8-0 Prolene, distal anastomosis was performed.  The distal extent of the same vein was then carried to the second obtuse marginal, which was a similar sized vessel. Using a running 8-0 Prolene, distal anastomosis was performed. Additional cold blood cardioplegia administered down the vein grafts. Attention was then turned to the left anterior descending coronary artery where the distal third of the vessel was opened.  The proximal two-thirds of the vessel were extremely calcified and diseased.  The vessel was opened and admitted a 1 mm probe distally.  Using a running 8- 0 Prolene, the left internal mammary artery was anastomosed to the left anterior descending coronary artery.  With the crossclamp still in place, 2 punch aortotomies were performed and each of the two vein grafts were anastomosed to the ascending aorta.  Body temperature was rewarmed to 37 degrees.  Bulldog on the mammary artery was removed with rise in myocardial septal temperature.  The heart was allowed to passively fill and de-air through the aortic root and the proximal anastomoses were completed.  The aortic crossclamp was removed.  Total cross-clamp time of 96 minutes.  The patient spontaneously converted to a sinus rhythm.  She was atrially paced, increased rate.  Sites of anastomosis were inspected and free of bleeding.  She was then ventilated and weaned from cardiopulmonary bypass without difficulty. She remained hemodynamically stable and cannulated in the usual fashion. Protamine sulfate was administered with operative fields hemostatic atrium.   Pericardium was loosely reapproximated.  A left pleural tube and a Blake mediastinal drain were left in place.  Sternum was closed with #6 stainless steel wire.  Fascia was closed with interrupted running 3-0 Vicryl in the subcutaneous tissue and 3-0 subcuticular stitch in skin edges.  Dry dressings were applied.  Sponge and needle counts were reported as correct after completion of the procedure.  The patient tolerated the procedure without obvious complication.  Total pump time was 132 minutes.  Because of low starting hematocrit and low hematocrit while on bypass, she was given 2 units of packed red blood cells on-pump.     Lanelle Bal, MD     EG/MEDQ  D:  03/26/2016  T:  03/26/2016  Job:  [id number]  cc:   Jettie Booze, MD

## 2016-03-26 NOTE — Progress Notes (Signed)
Patient ID: Andrea Wilkinson, female   DOB: [birth year], 82 y.o.   MRN: [id number] EVENING ROUNDS NOTE :     [address]       [place] [address]             [phone]                 2 Days Post-Op Procedure(s) (LRB): CORONARY ARTERY BYPASS GRAFTING (CABG) x 4 utilizing left internal mammary artery and left greater saphenous vein, bilateral saphenous veins harvested endoscopicaly,  SVG to PD, LIMA  to LAD, SEQ SVG to OM1 and OM2 (N/A) TRANSESOPHAGEAL ECHOCARDIOGRAM (TEE) (N/A)  Total Length of Stay:  LOS: 4 days  BP 134/62   Pulse 90   Temp 97.8 F (36.6 C) (Oral)   Resp 15   Ht 5' (1.524 m)   Wt 158 lb 8.2 oz (71.9 kg)   SpO2 94%   BMI 30.96 kg/m   .Intake/Output      10/08 0701 - 10/09 0700   P.O. 600   I.V. (mL/kg) 80 (1.1)   IV Piggyback 100   Total Intake(mL/kg) 780 (10.8)   Urine (mL/kg/hr) 2562 (2.9)   Chest Tube 40 (0)   Total Output 2602   Net -1822         . sodium chloride    . lactated ringers Stopped (03/26/16 1100)     Lab Results  Component Value Date   WBC 13.2 (H) 03/26/2016   HGB 10.3 (L) 03/26/2016   HCT 33.2 (L) 03/26/2016   PLT 161 03/26/2016   GLUCOSE 78 03/26/2016   CHOL 179 11/03/2015   TRIG 178.0 (H) 11/03/2015   HDL 45.20 11/03/2015   LDLDIRECT 90.0 05/05/2015   LDLCALC 98 11/03/2015   ALT 19 03/23/2016   AST 40 03/23/2016   NA 138 03/26/2016   K 3.2 (L) 03/26/2016   CL 105 03/26/2016   CREATININE 1.18 (H) 03/26/2016   BUN 12 03/26/2016   CO2 27 03/26/2016   TSH 1.74 11/03/2015   INR 1.49 03/24/2016   HGBA1C 7.3 (H) 03/24/2016   MICROALBUR 1.1 11/03/2015   Stable day, walking with help To stepdown in am  Grace Isaac MD  Beeper [phone] Office [phone] 03/26/2016 7:08 PM

## 2016-03-26 NOTE — Progress Notes (Signed)
Hypoglycemic Event  CBG: 65  Treatment: 15 GM carbohydrate snack  Symptoms: Pale and Sweaty  Follow-up CBG: [id number] CBG Result:75  Possible Reasons for Event: Inadequate meal intake  Comments/MD notified:MD to be made aware on rounds    Burman Blacksmith

## 2016-03-27 ENCOUNTER — Encounter (HOSPITAL_COMMUNITY): Payer: Self-pay | Admitting: Cardiothoracic Surgery

## 2016-03-27 ENCOUNTER — Inpatient Hospital Stay (HOSPITAL_COMMUNITY): Payer: Commercial Managed Care - HMO

## 2016-03-27 DIAGNOSIS — Z951 Presence of aortocoronary bypass graft: Secondary | ICD-10-CM

## 2016-03-27 LAB — CBC
HCT: 33.9 % — ABNORMAL LOW (ref 36.0–46.0)
Hemoglobin: 10.4 g/dL — ABNORMAL LOW (ref 12.0–15.0)
MCH: 28 pg (ref 26.0–34.0)
MCHC: 30.7 g/dL (ref 30.0–36.0)
MCV: 91.4 fL (ref 78.0–100.0)
Platelets: 157 10*3/uL (ref 150–400)
RBC: 3.71 MIL/uL — ABNORMAL LOW (ref 3.87–5.11)
RDW: 15 % (ref 11.5–15.5)
WBC: 10.6 10*3/uL — ABNORMAL HIGH (ref 4.0–10.5)

## 2016-03-27 LAB — BASIC METABOLIC PANEL
Anion gap: 8 (ref 5–15)
BUN: 16 mg/dL (ref 6–20)
CO2: 29 mmol/L (ref 22–32)
Calcium: 8.9 mg/dL (ref 8.9–10.3)
Chloride: 103 mmol/L (ref 101–111)
Creatinine, Ser: 1.08 mg/dL — ABNORMAL HIGH (ref 0.44–1.00)
GFR calc Af Amer: 54 mL/min — ABNORMAL LOW (ref >60)
GFR calc non Af Amer: 46 mL/min — ABNORMAL LOW (ref >60)
Glucose, Bld: 76 mg/dL (ref 65–99)
Potassium: 3.8 mmol/L (ref 3.5–5.1)
Sodium: 140 mmol/L (ref 135–145)

## 2016-03-27 LAB — GLUCOSE, CAPILLARY
GLUCOSE-CAPILLARY: 128 mg/dL — AB (ref 65–99)
GLUCOSE-CAPILLARY: 141 mg/dL — AB (ref 65–99)
GLUCOSE-CAPILLARY: 171 mg/dL — AB (ref 65–99)
Glucose-Capillary: 73 mg/dL (ref 65–99)
Glucose-Capillary: 298 mg/dL — ABNORMAL HIGH (ref 65–99)
Glucose-Capillary: 207 mg/dL — ABNORMAL HIGH (ref 65–99)
Glucose-Capillary: 179 mg/dL — ABNORMAL HIGH (ref 65–99)

## 2016-03-27 LAB — MAGNESIUM: Magnesium: 2.1 mg/dL (ref 1.7–2.4)

## 2016-03-27 LAB — TSH: TSH: 0.907 u[IU]/mL (ref 0.350–4.500)

## 2016-03-27 MED ORDER — AMIODARONE HCL 200 MG PO TABS
200.0000 mg | ORAL_TABLET | Freq: Every day | ORAL | Status: DC
  Administered 2016-03-28: 200 mg via ORAL

## 2016-03-27 MED ORDER — AMIODARONE LOAD VIA INFUSION
150.0000 mg | Freq: Once | INTRAVENOUS | Status: DC
  Filled 2016-03-27: qty 83.34

## 2016-03-27 MED ORDER — ALUM HYDROXIDE-MAG TRISILICATE 80-20 MG PO CHEW
1.0000 | CHEWABLE_TABLET | Freq: Three times a day (TID) | ORAL | Status: DC | PRN
  Administered 2016-03-27: 1 via ORAL
  Filled 2016-03-27 (×3): qty 1

## 2016-03-27 MED ORDER — AMIODARONE IV BOLUS ONLY 150 MG/100ML
150.0000 mg | Freq: Once | INTRAVENOUS | Status: AC
  Administered 2016-03-27: 150 mg via INTRAVENOUS
  Filled 2016-03-27: qty 100

## 2016-03-27 MED ORDER — FUROSEMIDE 10 MG/ML IJ SOLN
20.0000 mg | Freq: Once | INTRAMUSCULAR | Status: AC
  Administered 2016-03-27: 20 mg via INTRAVENOUS
  Filled 2016-03-27: qty 2

## 2016-03-27 MED ORDER — AMIODARONE HCL 200 MG PO TABS
200.0000 mg | ORAL_TABLET | Freq: Two times a day (BID) | ORAL | Status: DC
  Administered 2016-03-27 – 2016-03-30 (×6): 200 mg via ORAL
  Filled 2016-03-27 (×6): qty 1

## 2016-03-27 MED ORDER — POLYVINYL ALCOHOL 1.4 % OP SOLN
1.0000 [drp] | OPHTHALMIC | Status: DC | PRN
  Filled 2016-03-27: qty 15

## 2016-03-27 MED FILL — Lidocaine HCl IV Inj 20 MG/ML: INTRAVENOUS | Qty: 5 | Status: AC

## 2016-03-27 MED FILL — Sodium Bicarbonate IV Soln 8.4%: INTRAVENOUS | Qty: 50 | Status: AC

## 2016-03-27 MED FILL — Sodium Chloride IV Soln 0.9%: INTRAVENOUS | Qty: 2000 | Status: AC

## 2016-03-27 MED FILL — Mannitol IV Soln 20%: INTRAVENOUS | Qty: 500 | Status: AC

## 2016-03-27 MED FILL — Heparin Sodium (Porcine) Inj 1000 Unit/ML: INTRAMUSCULAR | Qty: 10 | Status: AC

## 2016-03-27 MED FILL — Electrolyte-R (PH 7.4) Solution: INTRAVENOUS | Qty: 3000 | Status: AC

## 2016-03-27 NOTE — Progress Notes (Signed)
  Amiodarone Drug - Drug Interaction Consult Note  Recommendations:   No specific recs, monitor K if Lasix to continue.  Amiodarone is metabolized by the cytochrome P450 system and therefore has the potential to cause many drug interactions. Amiodarone has an average plasma half-life of 50 days (range 20 to 100 days).   There is potential for drug interactions to occur several weeks or months after stopping treatment and the onset of drug interactions may be slow after initiating amiodarone.   []  Statins: Increased risk of myopathy. Simvastatin- restrict dose to 20mg  daily. Other statins: counsel patients to report any muscle pain or weakness immediately.  []  Anticoagulants: Amiodarone can increase anticoagulant effect. Consider warfarin dose reduction. Patients should be monitored closely and the dose of anticoagulant altered accordingly, remembering that amiodarone levels take several weeks to stabilize.  []  Antiepileptics: Amiodarone can increase plasma concentration of phenytoin, the dose should be reduced. Note that small changes in phenytoin dose can result in large changes in levels. Monitor patient and counsel on signs of toxicity.  [x]  Beta blockers: increased risk of bradycardia, AV block and myocardial depression. Sotalol - avoid concomitant use.  (ON LOPRESSOR)  []   Calcium channel blockers (diltiazem and verapamil): increased risk of bradycardia, AV block and myocardial depression.  []   Cyclosporine: Amiodarone increases levels of cyclosporine. Reduced dose of cyclosporine is recommended.  []  Digoxin dose should be halved when amiodarone is started.  [x]  Diuretics: increased risk of cardiotoxicity if hypokalemia occurs.  (ON LASIX INTERMITTENTLY)  []  Oral hypoglycemic agents (glyburide, glipizide, glimepiride): increased risk of hypoglycemia. Patient's glucose levels should be monitored closely when initiating amiodarone therapy.   []  Drugs that prolong the QT interval:   Torsades de pointes risk may be increased with concurrent use - avoid if possible.  Monitor QTc, also keep magnesium/potassium WNL if concurrent therapy can't be avoided. Marland Kitchen Antibiotics: e.g. fluoroquinolones, erythromycin. . Antiarrhythmics: e.g. quinidine, procainamide, disopyramide, sotalol. . Antipsychotics: e.g. phenothiazines, haloperidol.  . Lithium, tricyclic antidepressants, and methadone.  Thank You,  Wynona Neat, PharmD, BCPS  03/27/2016 7:37 AM

## 2016-03-27 NOTE — Progress Notes (Signed)
Patient ID: Andrea Wilkinson, female   DOB: 01-Aug-1931, 82 y.o.   MRN: [id number] TCTS DAILY ICU PROGRESS NOTE                   [address]            [place] [address]          [phone]   3 Days Post-Op Procedure(s) (LRB): CORONARY ARTERY BYPASS GRAFTING (CABG) x 4 utilizing left internal mammary artery and left greater saphenous vein, bilateral saphenous veins harvested endoscopicaly,  SVG to PD, LIMA  to LAD, SEQ SVG to OM1 and OM2 (N/A) TRANSESOPHAGEAL ECHOCARDIOGRAM (TEE) (N/A)  Total Length of Stay:  LOS: 5 days   Subjective: Up to chair , alert  Objective: Vital signs in last 24 hours: Temp:  [97.2 F (36.2 C)-98.2 F (36.8 C)] 97.2 F (36.2 C) (10/09 0433) Pulse Rate:  [76-124] 124 (10/09 0700) Cardiac Rhythm: Atrial fibrillation (10/09 0615) Resp:  [11-28] 16 (10/09 0700) BP: (110-159)/(60-91) 121/71 (10/09 0700) SpO2:  [93 %-100 %] 93 % (10/09 0700) Weight:  [155 lb 3.3 oz (70.4 kg)] 155 lb 3.3 oz (70.4 kg) (10/09 0500)  Filed Weights   03/25/16 0545 03/26/16 0500 03/27/16 0500  Weight: 161 lb 6 oz (73.2 kg) 158 lb 8.2 oz (71.9 kg) 155 lb 3.3 oz (70.4 kg)    Weight change: -3 lb 4.9 oz (-1.5 kg)   Hemodynamic parameters for last 24 hours:    Intake/Output from previous day: 10/08 0701 - 10/09 0700 In: 980 [P.O.:800; I.V.:80; IV Piggyback:100] Out: [id number]; Chest Tube:40]  Intake/Output this shift: No intake/output data recorded.  Current Meds: Scheduled Meds: . acetaminophen  1,000 mg Oral Q6H   Or  . acetaminophen (TYLENOL) oral liquid 160 mg/5 mL  1,000 mg Per Tube Q6H  . amiodarone  150 mg Intravenous Once  . amiodarone  200 mg Oral Q12H   Followed by  . [START ON 04/04/2016] amiodarone  200 mg Oral Daily  . aspirin EC  325 mg Oral Daily   Or  . aspirin  324 mg Per Tube Daily  . atorvastatin  40 mg Oral q1800  . bisacodyl  10 mg Oral Daily   Or  . bisacodyl  10 mg Rectal Daily  . docusate sodium  200 mg Oral  Daily  . enoxaparin (LOVENOX) injection  30 mg Subcutaneous Q24H  . furosemide  20 mg Intravenous Once  . insulin aspart  0-24 Units Subcutaneous Q4H  . insulin detemir  15 Units Subcutaneous Daily  . metoCLOPramide (REGLAN) injection  10 mg Intravenous Q6H  . metoprolol tartrate  12.5 mg Oral BID   Or  . metoprolol tartrate  12.5 mg Per Tube BID  . pantoprazole  40 mg Oral Daily  . sodium chloride flush  3 mL Intravenous Q12H   Continuous Infusions: . sodium chloride    . lactated ringers Stopped (03/26/16 1100)   PRN Meds:.lactated ringers, metoprolol, ondansetron (ZOFRAN) IV, oxyCODONE, sodium chloride flush, traMADol  General appearance: alert and cooperative Neurologic: intact Heart: irregularly irregular rhythm Lungs: diminished breath sounds bibasilar Abdomen: soft, non-tender; bowel sounds normal; no masses,  no organomegaly Extremities: extremities normal, atraumatic, no cyanosis or edema and Homans sign is negative, no sign of DVT Wound: sternum stable  Lab Results: CBC: Recent Labs  03/26/16 0426 03/27/16 0400  WBC 13.2* 10.6*  HGB 10.3* 10.4*  HCT 33.2* 33.9*  PLT 161 157   BMET:  Recent  Labs  03/26/16 0426 03/27/16 0400  NA 138 140  K 3.2* 3.8  CL 105 103  CO2 27 29  GLUCOSE 78 76  BUN 12 16  CREATININE 1.18* 1.08*  CALCIUM 8.6* 8.9    CMET: Lab Results  Component Value Date   WBC 10.6 (H) 03/27/2016   HGB 10.4 (L) 03/27/2016   HCT 33.9 (L) 03/27/2016   PLT 157 03/27/2016   GLUCOSE 76 03/27/2016   CHOL 179 11/03/2015   TRIG 178.0 (H) 11/03/2015   HDL 45.20 11/03/2015   LDLDIRECT 90.0 05/05/2015   LDLCALC 98 11/03/2015   ALT 19 03/23/2016   AST 40 03/23/2016   NA 140 03/27/2016   K 3.8 03/27/2016   CL 103 03/27/2016   CREATININE 1.08 (H) 03/27/2016   BUN 16 03/27/2016   CO2 29 03/27/2016   TSH 1.74 11/03/2015   INR 1.49 03/24/2016   HGBA1C 7.3 (H) 03/24/2016   MICROALBUR 1.1 11/03/2015    PT/INR:  Recent Labs  03/24/16 1550    LABPROT 18.1*  INR 1.49   Radiology: No results found.   Assessment/Plan: S/P Procedure(s) (LRB): CORONARY ARTERY BYPASS GRAFTING (CABG) x 4 utilizing left internal mammary artery and left greater saphenous vein, bilateral saphenous veins harvested endoscopicaly,  SVG to PD, LIMA  to LAD, SEQ SVG to OM1 and OM2 (N/A) TRANSESOPHAGEAL ECHOCARDIOGRAM (TEE) (N/A) Mobilize Diuresis now bursts of afib  but not very rapid, bolus of cordrone iv then po  Monitor afib before transfer to floor    Grace Isaac 03/27/2016 7:36 AM

## 2016-03-27 NOTE — Progress Notes (Signed)
DAILY PROGRESS NOTE  Subjective:  PAF noted overnight. Plan to start amiodarone today. Diuresing.   Objective:  Temp:  [97.2 F (36.2 C)-98.2 F (36.8 C)] 97.6 F (36.4 C) (10/09 0700) Pulse Rate:  [76-124] 124 (10/09 0700) Resp:  [11-28] 16 (10/09 0700) BP: (110-159)/(60-89) 121/71 (10/09 0700) SpO2:  [93 %-100 %] 93 % (10/09 0700) Weight:  [155 lb 3.3 oz (70.4 kg)] 155 lb 3.3 oz (70.4 kg) (10/09 0500) Weight change: -3 lb 4.9 oz (-1.5 kg)  Intake/Output from previous day: 10/08 0701 - 10/09 0700 In: 980 [P.O.:800; I.V.:80; IV Piggyback:100] Out: 7829 [FAOZH:0865; Chest Tube:40]  Intake/Output from this shift: No intake/output data recorded.  Medications: No current facility-administered medications on file prior to encounter.    Current Outpatient Prescriptions on File Prior to Encounter  Medication Sig Dispense Refill  . beta carotene w/minerals (OCUVITE) tablet Take 1 tablet by mouth daily.    . enalapril (VASOTEC) 20 MG tablet Take 1 tablet (20 mg total) by mouth daily. 90 tablet 3  . furosemide (LASIX) 20 MG tablet TAKE 1 TABLET (20 MG TOTAL) BY MOUTH DAILY. 90 tablet 1  . meloxicam (MOBIC) 15 MG tablet TAKE 1 TABLET (15 MG TOTAL) BY MOUTH DAILY. (Patient taking differently: TAKE 1 TABLET (15 MG TOTAL) BY MOUTH DAILY IN THE EVENING) 90 tablet 1  . metFORMIN (GLUCOPHAGE) 1000 MG tablet Take 1 tablet (1,000 mg total) by mouth 2 (two) times daily with a meal. 180 tablet 3  . omeprazole (PRILOSEC) 20 MG capsule Take 1 capsule (20 mg total) by mouth daily. 90 capsule 3  . oxybutynin (DITROPAN-XL) 5 MG 24 hr tablet Take 1 tablet (5 mg total) by mouth daily. (Patient taking differently: Take 5 mg by mouth daily as needed (for frequency). ) 90 tablet 3  . traMADol (ULTRAM) 50 MG tablet Take 1 tablet (50 mg total) by mouth every 6 (six) hours as needed. 360 tablet 1  . ACCU-CHEK AVIVA PLUS test strip USE TO CHECK BLOOD SUGAR TWICE DAILY 200 each 3  . Blood Glucose  Monitoring Suppl (ACCU-CHEK AVIVA PLUS) W/DEVICE KIT Use to check blood sugars daily 1 kit 0  . glucose blood (ACCU-CHEK AVIVA PLUS) test strip Use to check blood sugar twice a day Dx 250.00 180 each 3  . glucose blood (ACCU-CHEK COMPACT TEST DRUM) test strip Use as instructed 100 each 12  . Lancets (ACCU-CHEK MULTICLIX) lancets Use to help check blood sugars twice a day Dx 250.00 180 each 3  . tiZANidine (ZANAFLEX) 2 MG tablet TAKE 1 TABLET EVERY 6 HOURS AS NEEDED FOR MUSCLE SPASMS (Patient not taking: Reported on 03/22/2016) 120 tablet 3    Physical Exam: General appearance: alert and no distress Neck: no carotid bruit and no JVD Lungs: clear to auscultation bilaterally and dressed midline sternotomy Heart: regular rate and rhythm, S1, S2 normal, no murmur, click, rub or gallop Abdomen: soft, non-tender; bowel sounds normal; no masses,  no organomegaly Extremities: edema trace to 1+ edema Pulses: 2+ and symmetric Skin: Skin color, texture, turgor normal. No rashes or lesions Neurologic: Grossly normal Psych: Pleasant, up to eat breakfast  Lab Results: Results for orders placed or performed during the hospital encounter of 03/22/16 (from the past 48 hour(s))  Glucose, capillary     Status: Abnormal   Collection Time: 03/25/16  9:16 AM  Result Value Ref Range   Glucose-Capillary 107 (H) 65 - 99 mg/dL   Comment 1 Capillary Specimen   Glucose, capillary  Status: None   Collection Time: 03/25/16 10:09 AM  Result Value Ref Range   Glucose-Capillary 96 65 - 99 mg/dL   Comment 1 Arterial Specimen   Glucose, capillary     Status: Abnormal   Collection Time: 03/25/16 11:10 AM  Result Value Ref Range   Glucose-Capillary 184 (H) 65 - 99 mg/dL   Comment 1 Arterial Specimen   Glucose, capillary     Status: Abnormal   Collection Time: 03/25/16 12:31 PM  Result Value Ref Range   Glucose-Capillary 149 (H) 65 - 99 mg/dL   Comment 1 Capillary Specimen   Glucose, capillary     Status:  Abnormal   Collection Time: 03/25/16  1:33 PM  Result Value Ref Range   Glucose-Capillary 127 (H) 65 - 99 mg/dL   Comment 1 Capillary Specimen   Glucose, capillary     Status: Abnormal   Collection Time: 03/25/16  2:24 PM  Result Value Ref Range   Glucose-Capillary 184 (H) 65 - 99 mg/dL   Comment 1 Capillary Specimen   Glucose, capillary     Status: Abnormal   Collection Time: 03/25/16  4:23 PM  Result Value Ref Range   Glucose-Capillary 169 (H) 65 - 99 mg/dL   Comment 1 Capillary Specimen   Magnesium     Status: Abnormal   Collection Time: 03/25/16  4:35 PM  Result Value Ref Range   Magnesium 2.7 (H) 1.7 - 2.4 mg/dL  CBC     Status: Abnormal   Collection Time: 03/25/16  4:35 PM  Result Value Ref Range   WBC 11.3 (H) 4.0 - 10.5 K/uL   RBC 3.72 (L) 3.87 - 5.11 MIL/uL   Hemoglobin 10.7 (L) 12.0 - 15.0 g/dL   HCT 33.5 (L) 36.0 - 46.0 %   MCV 90.1 78.0 - 100.0 fL   MCH 28.8 26.0 - 34.0 pg   MCHC 31.9 30.0 - 36.0 g/dL   RDW 15.4 11.5 - 15.5 %   Platelets 149 (L) 150 - 400 K/uL  Creatinine, serum     Status: Abnormal   Collection Time: 03/25/16  4:35 PM  Result Value Ref Range   Creatinine, Ser 1.19 (H) 0.44 - 1.00 mg/dL   GFR calc non Af Amer 41 (L) >60 mL/min   GFR calc Af Amer 48 (L) >60 mL/min    Comment: (NOTE) The eGFR has been calculated using the CKD EPI equation. This calculation has not been validated in all clinical situations. eGFR's persistently <60 mL/min signify possible Chronic Kidney Disease.   I-STAT, chem 8     Status: Abnormal   Collection Time: 03/25/16  4:37 PM  Result Value Ref Range   Sodium 138 135 - 145 mmol/L   Potassium 3.9 3.5 - 5.1 mmol/L   Chloride 100 (L) 101 - 111 mmol/L   BUN 12 6 - 20 mg/dL   Creatinine, Ser 1.10 (H) 0.44 - 1.00 mg/dL   Glucose, Bld 177 (H) 65 - 99 mg/dL   Calcium, Ion 1.11 (L) 1.15 - 1.40 mmol/L   TCO2 23 0 - 100 mmol/L   Hemoglobin 10.9 (L) 12.0 - 15.0 g/dL   HCT 32.0 (L) 36.0 - 46.0 %  Glucose, capillary      Status: Abnormal   Collection Time: 03/25/16  8:48 PM  Result Value Ref Range   Glucose-Capillary 216 (H) 65 - 99 mg/dL   Comment 1 Capillary Specimen    Comment 2 Notify RN   Glucose, capillary     Status:  Abnormal   Collection Time: 03/25/16 11:09 PM  Result Value Ref Range   Glucose-Capillary 149 (H) 65 - 99 mg/dL   Comment 1 Capillary Specimen    Comment 2 Notify RN   Glucose, capillary     Status: None   Collection Time: 03/26/16  4:11 AM  Result Value Ref Range   Glucose-Capillary 65 65 - 99 mg/dL   Comment 1 Capillary Specimen    Comment 2 Notify RN   Basic metabolic panel     Status: Abnormal   Collection Time: 03/26/16  4:26 AM  Result Value Ref Range   Sodium 138 135 - 145 mmol/L   Potassium 3.2 (L) 3.5 - 5.1 mmol/L    Comment: DELTA CHECK NOTED   Chloride 105 101 - 111 mmol/L   CO2 27 22 - 32 mmol/L   Glucose, Bld 78 65 - 99 mg/dL   BUN 12 6 - 20 mg/dL   Creatinine, Ser 1.18 (H) 0.44 - 1.00 mg/dL   Calcium 8.6 (L) 8.9 - 10.3 mg/dL   GFR calc non Af Amer 42 (L) >60 mL/min   GFR calc Af Amer 48 (L) >60 mL/min    Comment: (NOTE) The eGFR has been calculated using the CKD EPI equation. This calculation has not been validated in all clinical situations. eGFR's persistently <60 mL/min signify possible Chronic Kidney Disease.    Anion gap 6 5 - 15  CBC     Status: Abnormal   Collection Time: 03/26/16  4:26 AM  Result Value Ref Range   WBC 13.2 (H) 4.0 - 10.5 K/uL   RBC 3.67 (L) 3.87 - 5.11 MIL/uL   Hemoglobin 10.3 (L) 12.0 - 15.0 g/dL   HCT 33.2 (L) 36.0 - 46.0 %   MCV 90.5 78.0 - 100.0 fL   MCH 28.1 26.0 - 34.0 pg   MCHC 31.0 30.0 - 36.0 g/dL   RDW 15.5 11.5 - 15.5 %   Platelets 161 150 - 400 K/uL  Glucose, capillary     Status: None   Collection Time: 03/26/16  4:29 AM  Result Value Ref Range   Glucose-Capillary 76 65 - 99 mg/dL  Glucose, capillary     Status: Abnormal   Collection Time: 03/26/16  6:11 AM  Result Value Ref Range   Glucose-Capillary 139  (H) 65 - 99 mg/dL   Comment 1 Capillary Specimen   Glucose, capillary     Status: Abnormal   Collection Time: 03/26/16  8:39 AM  Result Value Ref Range   Glucose-Capillary 165 (H) 65 - 99 mg/dL  Glucose, capillary     Status: Abnormal   Collection Time: 03/26/16 12:11 PM  Result Value Ref Range   Glucose-Capillary 220 (H) 65 - 99 mg/dL   Comment 1 Capillary Specimen    Comment 2 Notify RN   Glucose, capillary     Status: Abnormal   Collection Time: 03/26/16  4:22 PM  Result Value Ref Range   Glucose-Capillary 167 (H) 65 - 99 mg/dL   Comment 1 Capillary Specimen    Comment 2 Notify RN   Glucose, capillary     Status: Abnormal   Collection Time: 03/26/16  7:26 PM  Result Value Ref Range   Glucose-Capillary 200 (H) 65 - 99 mg/dL   Comment 1 Capillary Specimen    Comment 2 Notify RN    Comment 3 Document in Chart   Glucose, capillary     Status: Abnormal   Collection Time: 03/27/16 12:40 AM  Result  Value Ref Range   Glucose-Capillary 128 (H) 65 - 99 mg/dL   Comment 1 Capillary Specimen    Comment 2 Notify RN    Comment 3 Document in Chart   Basic metabolic panel     Status: Abnormal   Collection Time: 03/27/16  4:00 AM  Result Value Ref Range   Sodium 140 135 - 145 mmol/L   Potassium 3.8 3.5 - 5.1 mmol/L   Chloride 103 101 - 111 mmol/L   CO2 29 22 - 32 mmol/L   Glucose, Bld 76 65 - 99 mg/dL   BUN 16 6 - 20 mg/dL   Creatinine, Ser 1.08 (H) 0.44 - 1.00 mg/dL   Calcium 8.9 8.9 - 10.3 mg/dL   GFR calc non Af Amer 46 (L) >60 mL/min   GFR calc Af Amer 54 (L) >60 mL/min    Comment: (NOTE) The eGFR has been calculated using the CKD EPI equation. This calculation has not been validated in all clinical situations. eGFR's persistently <60 mL/min signify possible Chronic Kidney Disease.    Anion gap 8 5 - 15  CBC     Status: Abnormal   Collection Time: 03/27/16  4:00 AM  Result Value Ref Range   WBC 10.6 (H) 4.0 - 10.5 K/uL   RBC 3.71 (L) 3.87 - 5.11 MIL/uL   Hemoglobin 10.4  (L) 12.0 - 15.0 g/dL   HCT 33.9 (L) 36.0 - 46.0 %   MCV 91.4 78.0 - 100.0 fL   MCH 28.0 26.0 - 34.0 pg   MCHC 30.7 30.0 - 36.0 g/dL   RDW 15.0 11.5 - 15.5 %   Platelets 157 150 - 400 K/uL  Glucose, capillary     Status: None   Collection Time: 03/27/16  4:31 AM  Result Value Ref Range   Glucose-Capillary 73 65 - 99 mg/dL   Comment 1 Capillary Specimen    Comment 2 Notify RN    Comment 3 Document in Chart   Glucose, capillary     Status: Abnormal   Collection Time: 03/27/16  7:44 AM  Result Value Ref Range   Glucose-Capillary 141 (H) 65 - 99 mg/dL   Comment 1 Notify RN     Imaging: Imaging results have been reviewed  Assessment:  1. Principal Problem: 2.   NSTEMI (non-ST elevated myocardial infarction) ([address]) 3. Active Problems: 4.   Diabetes mellitus type 2 with complications ([place]) 5.   Essential hypertension 6.   Diabetic retinopathy ([place]) 7.   GERD (gastroesophageal reflux disease) 8.   Chest pain with high risk of acute coronary syndrome 9.   Unstable angina ([place]) 10.   Cardiomyopathy, ischemic 11.   CKD (chronic kidney disease) stage 3, GFR 30-59 ml/min 12.   S/P CABG x 4 13.   Plan:  1. POD #3 from CABG x 4 - now having rate-controlled afib, she is asymptomatic with this. Agree with plan for IV amiodarone loading. May need to uptitrate b-blocker for goal HR <100. Net negative but still volume overloaded - diuresing.  BP well-controlled. Consider adding Brillinta prior to d/c when tubes/lines removed for long-term mortality benefit.  Time Spent Directly with Patient:  15 minutes  Length of Stay:  LOS: 5 days   Pixie Casino, MD, [facility] Attending Cardiologist [place] 03/27/2016, 8:23 AM

## 2016-03-27 NOTE — Care Management Important Message (Signed)
Important Message  Patient Details  Name: Andrea Wilkinson MRN: [id number] Date of Birth: 1931-11-11   Medicare Important Message Given:  Yes    Kyleen Villatoro Abena 03/27/2016, 10:17 AM

## 2016-03-27 NOTE — Progress Notes (Signed)
Inpatient Diabetes Program Recommendations  AACE/ADA: New Consensus Statement on Inpatient Glycemic Control (2015)  Target Ranges:  Prepandial:   less than 140 mg/dL      Peak postprandial:   less than 180 mg/dL (1-2 hours)      Critically ill patients:  140 - 180 mg/dL   Lab Results  Component Value Date   GLUCAP 298 (H) 03/27/2016   HGBA1C 7.3 (H) 03/24/2016    Review of Glycemic ControlResults for TIERNY, LABRANCHE (MRN [id number]) as of 03/27/2016 13:48  Ref. Range 03/26/2016 08:39 03/26/2016 12:11 03/26/2016 16:22 03/26/2016 19:26 03/27/2016 00:40 03/27/2016 04:31 03/27/2016 07:44 03/27/2016 11:29  Glucose-Capillary Latest Ref Range: 65 - 99 mg/dL 165 (H) 220 (H) 167 (H) 200 (H) 128 (H) 73 141 (H) 298 (H)    Diabetes history: Type 2 diabetes Outpatient Diabetes medications: Metformin Current orders for Inpatient glycemic control:  Levemir 15 units daily, Novolog TCTS q 4 hours  Inpatient Diabetes Program Recommendations:    Consider reducing Novolog correction to moderate tid with meals and HS.  Also may consider adding Novolog meal coverage 4 units tid with meals-Hold if patient eats less than 50%.  Thanks, Adah Perl, RN, BC-ADM Inpatient Diabetes Coordinator Pager [phone] (8a-5p)

## 2016-03-27 NOTE — Progress Notes (Signed)
TCTS BRIEF SICU PROGRESS NOTE  3 Days Post-Op  S/P Procedure(s) (LRB): CORONARY ARTERY BYPASS GRAFTING (CABG) x 4 utilizing left internal mammary artery and left greater saphenous vein, bilateral saphenous veins harvested endoscopicaly,  SVG to PD, LIMA  to LAD, SEQ SVG to OM1 and OM2 (N/A) TRANSESOPHAGEAL ECHOCARDIOGRAM (TEE) (N/A)   Stable day NSR w/ stable BP O2 sats 96-98% UOP adequate  Plan: Continue current care  Rexene Alberts, MD 03/27/2016 6:45 PM

## 2016-03-28 ENCOUNTER — Ambulatory Visit: Payer: Commercial Managed Care - HMO | Admitting: Nurse Practitioner

## 2016-03-28 LAB — GLUCOSE, CAPILLARY
GLUCOSE-CAPILLARY: 73 mg/dL (ref 65–99)
GLUCOSE-CAPILLARY: 142 mg/dL — AB (ref 65–99)
GLUCOSE-CAPILLARY: 210 mg/dL — AB (ref 65–99)
GLUCOSE-CAPILLARY: 99 mg/dL (ref 65–99)
Glucose-Capillary: 162 mg/dL — ABNORMAL HIGH (ref 65–99)
Glucose-Capillary: 205 mg/dL — ABNORMAL HIGH (ref 65–99)
Glucose-Capillary: 152 mg/dL — ABNORMAL HIGH (ref 65–99)

## 2016-03-28 LAB — BASIC METABOLIC PANEL
Anion gap: 12 (ref 5–15)
BUN: 19 mg/dL (ref 6–20)
CO2: 26 mmol/L (ref 22–32)
Calcium: 9.3 mg/dL (ref 8.9–10.3)
Chloride: 101 mmol/L (ref 101–111)
Creatinine, Ser: 1.26 mg/dL — ABNORMAL HIGH (ref 0.44–1.00)
GFR calc Af Amer: 45 mL/min — ABNORMAL LOW (ref >60)
GFR calc non Af Amer: 39 mL/min — ABNORMAL LOW (ref >60)
Glucose, Bld: 163 mg/dL — ABNORMAL HIGH (ref 65–99)
Potassium: 3.7 mmol/L (ref 3.5–5.1)
Sodium: 139 mmol/L (ref 135–145)

## 2016-03-28 MED ORDER — METFORMIN HCL 500 MG PO TABS
1000.0000 mg | ORAL_TABLET | Freq: Two times a day (BID) | ORAL | Status: DC
  Administered 2016-03-28 – 2016-03-30 (×5): 1000 mg via ORAL
  Filled 2016-03-28 (×5): qty 2

## 2016-03-28 MED ORDER — POTASSIUM CHLORIDE CRYS ER 20 MEQ PO TBCR
20.0000 meq | EXTENDED_RELEASE_TABLET | Freq: Once | ORAL | Status: AC
  Administered 2016-03-28: 20 meq via ORAL
  Filled 2016-03-28: qty 1

## 2016-03-28 MED ORDER — PANTOPRAZOLE SODIUM 40 MG PO TBEC
40.0000 mg | DELAYED_RELEASE_TABLET | Freq: Every day | ORAL | Status: DC
  Administered 2016-03-29 – 2016-03-30 (×2): 40 mg via ORAL
  Filled 2016-03-28 (×2): qty 1

## 2016-03-28 MED ORDER — ONDANSETRON HCL 4 MG PO TABS: 4.0000 mg | ORAL_TABLET | Freq: Four times a day (QID) | ORAL | Status: DC | PRN

## 2016-03-28 MED ORDER — BISACODYL 5 MG PO TBEC: 10.0000 mg | DELAYED_RELEASE_TABLET | Freq: Every day | ORAL | Status: DC | PRN

## 2016-03-28 MED ORDER — TRAMADOL HCL 50 MG PO TABS
50.0000 mg | ORAL_TABLET | ORAL | Status: DC | PRN
  Administered 2016-03-28: 50 mg via ORAL
  Filled 2016-03-28: qty 1

## 2016-03-28 MED ORDER — LISINOPRIL 5 MG PO TABS
5.0000 mg | ORAL_TABLET | Freq: Every day | ORAL | Status: DC
  Administered 2016-03-28 – 2016-03-30 (×3): 5 mg via ORAL
  Filled 2016-03-28 (×3): qty 1

## 2016-03-28 MED ORDER — SODIUM CHLORIDE 0.9% FLUSH
3.0000 mL | INTRAVENOUS | Status: DC | PRN
Start: 2016-03-28 — End: 2016-03-30

## 2016-03-28 MED ORDER — BISACODYL 10 MG RE SUPP: 10.0000 mg | Freq: Every day | RECTAL | Status: DC | PRN

## 2016-03-28 MED ORDER — INSULIN ASPART 100 UNIT/ML ~~LOC~~ SOLN
0.0000 [IU] | Freq: Three times a day (TID) | SUBCUTANEOUS | Status: DC
  Administered 2016-03-28: 8 [IU] via SUBCUTANEOUS
  Administered 2016-03-28: 2 [IU] via SUBCUTANEOUS
  Administered 2016-03-29: 4 [IU] via SUBCUTANEOUS
  Administered 2016-03-29: 2 [IU] via SUBCUTANEOUS
  Administered 2016-03-29 (×2): 4 [IU] via SUBCUTANEOUS
  Administered 2016-03-30: 2 [IU] via SUBCUTANEOUS

## 2016-03-28 MED ORDER — METOPROLOL TARTRATE 12.5 MG HALF TABLET
12.5000 mg | ORAL_TABLET | Freq: Two times a day (BID) | ORAL | Status: DC
  Administered 2016-03-28: 12.5 mg via ORAL
  Filled 2016-03-28: qty 1

## 2016-03-28 MED ORDER — SODIUM CHLORIDE 0.9 % IV SOLN: 250.0000 mL | INTRAVENOUS | Status: DC | PRN

## 2016-03-28 MED ORDER — ASPIRIN EC 81 MG PO TBEC
81.0000 mg | DELAYED_RELEASE_TABLET | Freq: Every day | ORAL | Status: DC
  Administered 2016-03-29 – 2016-03-30 (×2): 81 mg via ORAL
  Filled 2016-03-28 (×2): qty 1

## 2016-03-28 MED ORDER — SODIUM CHLORIDE 0.9% FLUSH
3.0000 mL | Freq: Two times a day (BID) | INTRAVENOUS | Status: DC
  Administered 2016-03-28 – 2016-03-29 (×2): 3 mL via INTRAVENOUS

## 2016-03-28 MED ORDER — ONDANSETRON HCL 4 MG/2ML IJ SOLN: 4.0000 mg | Freq: Four times a day (QID) | INTRAMUSCULAR | Status: DC | PRN

## 2016-03-28 MED ORDER — MAGNESIUM HYDROXIDE 400 MG/5ML PO SUSP: 30.0000 mL | Freq: Every day | ORAL | Status: DC | PRN

## 2016-03-28 MED ORDER — MOVING RIGHT ALONG BOOK
Freq: Once | Status: AC
  Administered 2016-03-28: 10:00:00
  Filled 2016-03-28: qty 1

## 2016-03-28 NOTE — Progress Notes (Signed)
Patient ID: CHERYL-ANN OAKLEY, female   DOB: 08/02/1931, 82 y.o.   MRN: [id number] TCTS DAILY ICU PROGRESS NOTE                   [address]            [place] [address]          [phone]   4 Days Post-Op Procedure(s) (LRB): CORONARY ARTERY BYPASS GRAFTING (CABG) x 4 utilizing left internal mammary artery and left greater saphenous vein, bilateral saphenous veins harvested endoscopicaly,  SVG to PD, LIMA  to LAD, SEQ SVG to OM1 and OM2 (N/A) TRANSESOPHAGEAL ECHOCARDIOGRAM (TEE) (N/A)  Total Length of Stay:  LOS: 6 days   Subjective: Stable night no further afib  Objective: Vital signs in last 24 hours: Temp:  [97.6 F (36.4 C)-98.5 F (36.9 C)] 97.6 F (36.4 C) (10/10 0726) Pulse Rate:  [75-101] 101 (10/10 0900) Cardiac Rhythm: Normal sinus rhythm (10/10 0753) Resp:  [11-23] 15 (10/10 0900) BP: (108-160)/(49-102) 156/76 (10/10 0900) SpO2:  [94 %-99 %] 96 % (10/10 0900) Weight:  [153 lb 14.1 oz (69.8 kg)] 153 lb 14.1 oz (69.8 kg) (10/10 0400)  Filed Weights   03/26/16 0500 03/27/16 0500 03/28/16 0400  Weight: 158 lb 8.2 oz (71.9 kg) 155 lb 3.3 oz (70.4 kg) 153 lb 14.1 oz (69.8 kg)    Weight change: -1 lb 5.2 oz (-0.6 kg)   Hemodynamic parameters for last 24 hours:    Intake/Output from previous day: 10/09 0701 - 10/10 0700 In: 1140 [P.O.:1040; I.V.:100] Out: 2450 [Urine:2450]  Intake/Output this shift: Total I/O In: 360 [P.O.:360] Out: 450 [Urine:450]  Current Meds: Scheduled Meds: . acetaminophen  1,000 mg Oral Q6H   Or  . acetaminophen (TYLENOL) oral liquid 160 mg/5 mL  1,000 mg Per Tube Q6H  . amiodarone  200 mg Oral Q12H   Followed by  . [START ON 04/04/2016] amiodarone  200 mg Oral Daily  . aspirin EC  325 mg Oral Daily   Or  . aspirin  324 mg Per Tube Daily  . atorvastatin  40 mg Oral q1800  . bisacodyl  10 mg Oral Daily   Or  . bisacodyl  10 mg Rectal Daily  . docusate sodium  200 mg Oral Daily  . enoxaparin (LOVENOX) injection   30 mg Subcutaneous Q24H  . insulin aspart  0-24 Units Subcutaneous Q4H  . insulin detemir  15 Units Subcutaneous Daily  . metoprolol tartrate  12.5 mg Oral BID   Or  . metoprolol tartrate  12.5 mg Per Tube BID  . pantoprazole  40 mg Oral Daily  . sodium chloride flush  3 mL Intravenous Q12H   Continuous Infusions: . sodium chloride    . lactated ringers Stopped (03/26/16 1100)   PRN Meds:.alum hydroxide-mag trisilicate, lactated ringers, metoprolol, ondansetron (ZOFRAN) IV, oxyCODONE, polyvinyl alcohol, sodium chloride flush, traMADol  General appearance: alert and cooperative Neurologic: intact Heart: regular rate and rhythm, S1, S2 normal, no murmur, click, rub or gallop Lungs: clear to auscultation bilaterally Abdomen: soft, non-tender; bowel sounds normal; no masses,  no organomegaly Extremities: extremities normal, atraumatic, no cyanosis or edema and Homans sign is negative, no sign of DVT Wound: sternum intact  Lab Results: CBC: Recent Labs  03/26/16 0426 03/27/16 0400  WBC 13.2* 10.6*  HGB 10.3* 10.4*  HCT 33.2* 33.9*  PLT 161 157   BMET:  Recent Labs  03/27/16 0400 03/28/16 0350  NA 140 139  K 3.8 3.7  CL 103 101  CO2 29 26  GLUCOSE 76 163*  BUN 16 19  CREATININE 1.08* 1.26*  CALCIUM 8.9 9.3    CMET: Lab Results  Component Value Date   WBC 10.6 (H) 03/27/2016   HGB 10.4 (L) 03/27/2016   HCT 33.9 (L) 03/27/2016   PLT 157 03/27/2016   GLUCOSE 163 (H) 03/28/2016   CHOL 179 11/03/2015   TRIG 178.0 (H) 11/03/2015   HDL 45.20 11/03/2015   LDLDIRECT 90.0 05/05/2015   LDLCALC 98 11/03/2015   ALT 19 03/23/2016   AST 40 03/23/2016   NA 139 03/28/2016   K 3.7 03/28/2016   CL 101 03/28/2016   CREATININE 1.26 (H) 03/28/2016   BUN 19 03/28/2016   CO2 26 03/28/2016   TSH 0.907 03/27/2016   INR 1.49 03/24/2016   HGBA1C 7.3 (H) 03/24/2016   MICROALBUR 1.1 11/03/2015    PT/INR: No results for input(s): LABPROT, INR in the last 72 hours. Radiology:  No results found.   Assessment/Plan: S/P Procedure(s) (LRB): CORONARY ARTERY BYPASS GRAFTING (CABG) x 4 utilizing left internal mammary artery and left greater saphenous vein, bilateral saphenous veins harvested endoscopicaly,  SVG to PD, LIMA  to LAD, SEQ SVG to OM1 and OM2 (N/A) TRANSESOPHAGEAL ECHOCARDIOGRAM (TEE) (N/A) Mobilize Diuresis Diabetes control d/c pacing wires Plan for transfer to step-down: see transfer orders Renal function stable    Grace Isaac 03/28/2016 9:20 AM

## 2016-03-28 NOTE — Care Management Note (Addendum)
Case Management Note Original Note Created by Elissa Hefty 03/23/16  Patient Details  Name: Andrea Wilkinson MRN: [id number] Date of Birth: [birth year]  Subjective/Objective:         Adm w mi           Action/Plan: lives at home   Expected Discharge Date:   (unknown)               Expected Discharge Plan:  Home/Self Care  In-House Referral:     Discharge planning Services  CM Consult  Post Acute Care Choice:    Choice offered to:     DME Arranged:    DME Agency:     HH Arranged:    HH Agency:     Status of Service:  In process, will continue to follow  If discussed at Long Length of Stay Meetings, dates discussed:    Additional Comments:monitering for dc needs as pt progresses. 03/28/2016  4 days s/p CABG , d/c wires today and transfer to stepdown orders written.  Pt stated her adult son lives in the home with her, and between he and other family members- 24 hour supervision will be achieved post discharge.    03/24/16 S/p CABG [name], RN 03/28/2016, 3:45 PM

## 2016-03-29 ENCOUNTER — Inpatient Hospital Stay (HOSPITAL_COMMUNITY): Payer: Commercial Managed Care - HMO

## 2016-03-29 LAB — CBC
HCT: 33.8 % — ABNORMAL LOW (ref 36.0–46.0)
Hemoglobin: 10.8 g/dL — ABNORMAL LOW (ref 12.0–15.0)
MCH: 28.3 pg (ref 26.0–34.0)
MCHC: 32 g/dL (ref 30.0–36.0)
MCV: 88.7 fL (ref 78.0–100.0)
Platelets: 244 10*3/uL (ref 150–400)
RBC: 3.81 MIL/uL — ABNORMAL LOW (ref 3.87–5.11)
RDW: 14.7 % (ref 11.5–15.5)
WBC: 9.7 10*3/uL (ref 4.0–10.5)

## 2016-03-29 LAB — BASIC METABOLIC PANEL
Anion gap: 12 (ref 5–15)
Anion gap: 10 (ref 5–15)
BUN: 18 mg/dL (ref 6–20)
BUN: 19 mg/dL (ref 6–20)
CHLORIDE: 102 mmol/L (ref 101–111)
CO2: 24 mmol/L (ref 22–32)
CO2: 26 mmol/L (ref 22–32)
CREATININE: 1.28 mg/dL — AB (ref 0.44–1.00)
Calcium: 9.6 mg/dL (ref 8.9–10.3)
Calcium: 10 mg/dL (ref 8.9–10.3)
Chloride: 99 mmol/L — ABNORMAL LOW (ref 101–111)
Creatinine, Ser: 1.27 mg/dL — ABNORMAL HIGH (ref 0.44–1.00)
GFR calc Af Amer: 44 mL/min — ABNORMAL LOW (ref >60)
GFR calc non Af Amer: 38 mL/min — ABNORMAL LOW (ref >60)
GFR calc non Af Amer: 38 mL/min — ABNORMAL LOW (ref >60)
GFR, EST AFRICAN AMERICAN: 44 mL/min — AB (ref >60)
GLUCOSE: 159 mg/dL — AB (ref 65–99)
Glucose, Bld: 148 mg/dL — ABNORMAL HIGH (ref 65–99)
Potassium: 4.4 mmol/L (ref 3.5–5.1)
Potassium: 4.6 mmol/L (ref 3.5–5.1)
Sodium: 135 mmol/L (ref 135–145)
Sodium: 138 mmol/L (ref 135–145)

## 2016-03-29 LAB — GLUCOSE, CAPILLARY
GLUCOSE-CAPILLARY: 171 mg/dL — AB (ref 65–99)
GLUCOSE-CAPILLARY: 176 mg/dL — AB (ref 65–99)
Glucose-Capillary: 149 mg/dL — ABNORMAL HIGH (ref 65–99)
Glucose-Capillary: 191 mg/dL — ABNORMAL HIGH (ref 65–99)

## 2016-03-29 MED ORDER — METOPROLOL TARTRATE 25 MG PO TABS
25.0000 mg | ORAL_TABLET | Freq: Two times a day (BID) | ORAL | Status: DC
  Administered 2016-03-29 – 2016-03-30 (×3): 25 mg via ORAL
  Filled 2016-03-29 (×3): qty 1

## 2016-03-29 NOTE — Progress Notes (Signed)
CARDIAC REHAB PHASE I   PRE:  Rate/Rhythm: 85 SR  BP:  Sitting: 153/73        SaO2: 98 RA  MODE:  Ambulation: 150 ft   POST:  Rate/Rhythm: 100 SR  BP:  Sitting: 165/75         SaO2: 97 RA  Pt ambulated 150 ft on RA, rolling walker, assist x1, slow, fairly steady gait, tolerated well with no complaints other than increased fatigue with distance. Encouraged IS, additional ambulation x2 today. Pt to recliner after walk, feet elevated, call bell within reach. Will follow.   [id number] Lenna Sciara, RN, BSN 03/29/2016 11:15 AM

## 2016-03-29 NOTE — Discharge Summary (Signed)
Physician Discharge Summary  Patient ID: Andrea Wilkinson MRN: [id number] DOB/AGE: 80-Oct-1933 80 y.o.  Admit date: 03/22/2016 Discharge date: 03/30/2016  Admission Diagnoses:  Patient Active Problem List   Diagnosis Date Noted  . Cardiomyopathy, ischemic 03/23/2016  . CKD (chronic kidney disease) stage 3, GFR 30-59 ml/min 03/23/2016  . NSTEMI (non-ST elevated myocardial infarction) ([place]) 03/22/2016  . Chest pain with high risk of acute coronary syndrome 03/22/2016  . Unstable angina ([place]) 03/22/2016  . Odynophagia 03/01/2016  . GERD (gastroesophageal reflux disease) 01/18/2016  . Mass of soft tissue 01/18/2016  . Diabetic retinopathy of left eye associated with type 2 diabetes mellitus ([place]) 08/10/2015  . Preretinal fibrosis, left eye 08/02/2015  . Diabetic retinopathy ([place]) 08/02/2015  . OAB (overactive bladder) 05/05/2015  . Routine general medical examination at a health care facility 05/05/2015  . IBS (irritable bowel syndrome) 08/17/2014  . Eczema, allergic 04/07/2014  . Hyperlipidemia with target LDL less than 100 09/24/2013  . Hearing loss 02/27/2012  . Diabetes mellitus type 2 with complications ([place]) 74/71/5953  . Essential hypertension 06/07/2007  . Osteoarthritis 06/07/2007  . Osteoarthritis of spine 06/07/2007   Discharge Diagnoses:   Patient Active Problem List   Diagnosis Date Noted  . S/P CABG x 4 03/24/2016  . Cardiomyopathy, ischemic 03/23/2016  . CKD (chronic kidney disease) stage 3, GFR 30-59 ml/min 03/23/2016  . NSTEMI (non-ST elevated myocardial infarction) ([place]) 03/22/2016  . Chest pain with high risk of acute coronary syndrome 03/22/2016  . Unstable angina ([place]) 03/22/2016  . Odynophagia 03/01/2016  . GERD (gastroesophageal reflux disease) 01/18/2016  . Mass of soft tissue 01/18/2016  . Diabetic retinopathy of left eye associated with type 2 diabetes mellitus ([place]) 08/10/2015  . Preretinal fibrosis, left eye 08/02/2015  . Diabetic retinopathy ([place])  08/02/2015  . OAB (overactive bladder) 05/05/2015  . Routine general medical examination at a health care facility 05/05/2015  . IBS (irritable bowel syndrome) 08/17/2014  . Eczema, allergic 04/07/2014  . Hyperlipidemia with target LDL less than 100 09/24/2013  . Hearing loss 02/27/2012  . Diabetes mellitus type 2 with complications ([place]) 96/72/8979  . Essential hypertension 06/07/2007  . Osteoarthritis 06/07/2007  . Osteoarthritis of spine 06/07/2007   Discharged Condition: good  History of Present Illness:  Patient is an 82 yo WF with known history of DM with retinopathy, Hypertension, and no previous CAD.  She presented to [facility] ED today with complaints of epigastric pain that woke her from sleep.  She describes the pain as being severe.  She denies associated N/V, dyspnea, or diaphoresis, but does state the pain radiates to her back.  The patient states she has been experiencing this type of pain over the past several weeks.  She has tried antacids for this which provide relief, but her symptoms recur.  Workup in ED shows elevated Troponin level and EKG had ST changes.  On exam she was pain free, but continued to have episodes of chest pain since being in the ED.  She was ruled in for STEMI and transferred to [facility] for further workup.  She was taken to the cath lab and found to have severe multivessel CAD and a reduced EF.  She was admitted and started on Heparin  Hospital Course:   During hospitalization patient remained chest pain free.  It was felt coronary bypass grafting would be indicated and TCTS consult was requested.  She was evaluated by Dr. Servando Snare who was in agreement with proceeding with surgical intervention.  The  risks and benefits of the procedure were explained to the patient and she was agreeable to proceed.  She was taken to the operating room on 03/24/2016.  She underwent CABG x 4 utilizing LIMA to LAD, Sequential SVG to OM 1 and OM2, and SVG to PDA.  She also  underwent endoscopic harvest of greater saphenous from right leg and left thigh.  She tolerated the procedure without difficulty and was taken to the SICU in stable condition. She was extubated the evening of surgery without difficulty.  During her stay in the SICU the patients chest tube and arterial lines were removed without difficulty.  She developed bursts of Atrial Fibrillation and was treated with IV Amiodarone bolus with successful conversion to NSR.  She was ambulating and felt medically stable for transfer to the floor on POD #3.  The patient continued to make progress.  She had no further atrial fibrillation.  Her pacing wires were removed without difficulty.  She was hypertensive and her medications were titrated as tolerated.  She has known history of CKD.  Her creatinine was mildly elevated post operatively with most recent creatinine level at 1.1.   She continues to maintain NSR.  She is tolerating a diet and her pain is well controlled.  She is felt medically stable for discharge home today.     Significant Diagnostic Studies: angiography:    Prox RCA to Mid RCA lesion, 70 %stenosed.  Prox Cx to Mid Cx lesion, 80 %stenosed.  1st Mrg lesion, 80 %stenosed.  Lat 1st Mrg lesion, 80 %stenosed.  Dist Cx lesion, 50 %stenosed.  Prox LAD lesion, 90 %stenosed.  Mid LAD to Dist LAD lesion, 90 %stenosed.  1st Diag lesion, 99 %stenosed.  The left ventricular ejection fraction is 35-45% by visual estimate.  There is mild to moderate left ventricular systolic dysfunction.  LV end diastolic pressure is normal.  There is no aortic valve stenosis.   Severe multivessel disease.  Restart heparin in 8 hours.  CVTS consult for possible CABG.   Treatments: surgery:   Coronary artery bypass grafting x4 with left internal mammary to the left anterior descending coronary artery, sequential reverse saphenous vein graft to the first and second obtuse marginal coronary artery, reverse saphenous  vein graft to the posterior descending coronary artery arising from the distal circumflex with right greater saphenous thigh and calf Endo vein harvesting and left thigh greater saphenous harvesting endoscopically.  Disposition: 01-Home or Self Care   Discharge Medications:   The patient has been discharged on:   1.Beta Blocker:  Yes [x   ]                              No   [   ]                              If No, reason:  2.Ace Inhibitor/ARB: Yes [ x  ]                                     No  [    ]                                     If No,  reason:  3.Statin:   Yes [x   ]                  No  [   ]                  If No, reason:  4.Ecasa:  Yes  [ x  ]                  No   [   ]                  If No, reason:       Medication List    STOP taking these medications   furosemide 20 MG tablet Commonly known as:  LASIX   meloxicam 15 MG tablet Commonly known as:  MOBIC   tiZANidine 2 MG tablet Commonly known as:  ZANAFLEX     TAKE these medications   ACCU-CHEK AVIVA PLUS test strip Generic drug:  glucose blood USE TO CHECK BLOOD SUGAR TWICE DAILY What changed:  Another medication with the same name was removed. Continue taking this medication, and follow the directions you see here.   ACCU-CHEK AVIVA PLUS w/Device Kit Use to check blood sugars daily   accu-chek multiclix lancets Use to help check blood sugars twice a day Dx 250.00   amiodarone 200 MG tablet Commonly known as:  PACERONE Take 1 tablet (200 mg total) by mouth every 12 (twelve) hours. For 7 days then once daily   aspirin 81 MG EC tablet Take 1 tablet (81 mg total) by mouth daily.   atorvastatin 40 MG tablet Commonly known as:  LIPITOR Take 1 tablet (40 mg total) by mouth daily at 6 PM.   beta carotene w/minerals tablet Take 1 tablet by mouth daily.   enalapril 20 MG tablet Commonly known as:  VASOTEC Take 1 tablet (20 mg total) by mouth daily.   metFORMIN 1000 MG tablet Commonly  known as:  GLUCOPHAGE Take 1 tablet (1,000 mg total) by mouth 2 (two) times daily with a meal.   metoprolol tartrate 25 MG tablet Commonly known as:  LOPRESSOR Take 1 tablet (25 mg total) by mouth 2 (two) times daily.   omeprazole 20 MG capsule Commonly known as:  PRILOSEC Take 1 capsule (20 mg total) by mouth daily.   oxybutynin 5 MG 24 hr tablet Commonly known as:  DITROPAN-XL Take 1 tablet (5 mg total) by mouth daily. What changed:  when to take this  reasons to take this   traMADol 50 MG tablet Commonly known as:  ULTRAM Take 1 tablet (50 mg total) by mouth every 6 (six) hours as needed for moderate pain. What changed:  reasons to take this        Signed: GOLD,WAYNE E 03/30/2016, 8:41 AM

## 2016-03-29 NOTE — Progress Notes (Signed)
EPW d/c'd per order and per protocol. Tips intact. VSS. Pt educated on need for q15m vitals and bedrest x1h. Call bell and phone within reach. Will continue to monitor. 

## 2016-03-29 NOTE — Consult Note (Signed)
[facility] CM Primary Care Navigator  03/29/2016  Empress Newmann Naeve 01-30-1932 [id number]  Met with patient at the bedside to identify possible discharge needs. Patient states had been having epigastric pains that felt like choking her up that led to this admission/ surgery. Patient endorses Dr. Scarlette Calico with [facility] [place]  as the primary care provider.    Patient shared using [facility] for regular prescriptions and [facility] [address] [address] for her immediate medication needs. She denies difficulty obtaining and affording medications and states that she uses mostly generics being paid for by her insurance. She shared using "pill box"system with her medicines and self managing it.   Son Laurance Flatten - who lives close by) provides transportation to her doctors' appointments and son Herbie [place] - who lives with her) is the primary caregiver at home. She verbalized being independent with self care prior to this admission. Patient mentioned that 2 sons can alternately assist with her care at home.  Discussed with patient her [employer] transportation benefits in case it will be needed and she was grateful of the information.  Patient voiced understanding to call primary care provider's office for a post discharge follow-up appointment within a week or sooner if needs arise when she gets discharged. Patient letter provided for her reminder. She denies further needs or concerns at this time.    For additional questions please contact:  Edwena Felty A. Naudia Crosley, BSN, RN-BC [facility],The PRIMARY CARE Navigator Cell: [phone]

## 2016-03-29 NOTE — Progress Notes (Addendum)
      [address]       [place] [address]             [phone]      5 Days Post-Op Procedure(s) (LRB): CORONARY ARTERY BYPASS GRAFTING (CABG) x 4 utilizing left internal mammary artery and left greater saphenous vein, bilateral saphenous veins harvested endoscopicaly,  SVG to PD, LIMA  to LAD, SEQ SVG to OM1 and OM2 (N/A) TRANSESOPHAGEAL ECHOCARDIOGRAM (TEE) (N/A)   Subjective:  Ms. Churchill has no new complaints.  She is ambulating without much difficulty.  She has moved her bowels.    Objective: Vital signs in last 24 hours: Temp:  [97.4 F (36.3 C)-99.1 F (37.3 C)] 97.4 F (36.3 C) (10/11 0530) Pulse Rate:  [89-109] 92 (10/11 0530) Cardiac Rhythm: Normal sinus rhythm (10/10 1908) Resp:  [13-25] 20 (10/11 0530) BP: (91-179)/(60-144) 165/79 (10/11 0530) SpO2:  [94 %-100 %] 97 % (10/11 0530) Weight:  [152 lb 9.6 oz (69.2 kg)] 152 lb 9.6 oz (69.2 kg) (10/11 0530)  Intake/Output from previous day: 10/10 0701 - 10/11 0700 In: 360 [P.O.:360] Out: 1450 [Urine:1450]  General appearance: alert, cooperative and no distress Heart: regular rate and rhythm Lungs: clear to auscultation bilaterally Abdomen: soft, non-tender; bowel sounds normal; no masses,  no organomegaly Extremities: edema trace Wound: clean and dry  Lab Results:  Recent Labs  03/27/16 0400 03/29/16 0326  WBC 10.6* 9.7  HGB 10.4* 10.8*  HCT 33.9* 33.8*  PLT 157 244   BMET:  Recent Labs  03/28/16 0350 03/29/16 0326  NA 139 135  K 3.7 4.4  CL 101 99*  CO2 26 24  GLUCOSE 163* 148*  BUN 19 18  CREATININE 1.26* 1.27*  CALCIUM 9.3 9.6    PT/INR: No results for input(s): LABPROT, INR in the last 72 hours. ABG    Component Value Date/Time   PHART 7.398 03/25/2016 0013   HCO3 23.4 03/25/2016 0013   TCO2 23 03/25/2016 1637   ACIDBASEDEF 1.0 03/25/2016 0013   O2SAT 99.0 03/25/2016 0013   CBG (last 3)   Recent Labs  03/28/16 1749 03/28/16 2058 03/29/16 0624  GLUCAP 99  152* 149*    Assessment/Plan: S/P Procedure(s) (LRB): CORONARY ARTERY BYPASS GRAFTING (CABG) x 4 utilizing left internal mammary artery and left greater saphenous vein, bilateral saphenous veins harvested endoscopicaly,  SVG to PD, LIMA  to LAD, SEQ SVG to OM1 and OM2 (N/A) TRANSESOPHAGEAL ECHOCARDIOGRAM (TEE) (N/A)  1. CV- PAF, currently in NSR, + HTN- will increase Lopressor, continue Amiodarone, Lisinopril 2. Pulm- no acute issues, off oxygen, continue IS 3. Renal- creatinine is mildly elevated at 1.27, will repeat BMET if creatinine continues to elevate will need to d/c ACE, remains hypervolemic, will hold off on Lasix until repeat creatinine tomorrow 4. DM- sugars controlled, continue current regimen as tolerated 5. Dispo- patient stable, titrate meds as tolerated for HTN, d/c EPW today... Likely ready for d/c home tomorrow   LOS: 7 days    BARRETT, ERIN 03/29/2016   Likely home in am Ambulating well I have seen and examined Avaleen F Schoenfeld and agree with the above assessment  and plan.  Grace Isaac MD Beeper [phone] Office [phone] 03/29/2016 6:23 PM

## 2016-03-30 LAB — POCT I-STAT 3, ART BLOOD GAS (G3+)
ACID-BASE DEFICIT: 2 mmol/L (ref 0.0–2.0)
ACID-BASE EXCESS: 1 mmol/L (ref 0.0–2.0)
Bicarbonate: 24.9 mmol/L (ref 20.0–28.0)
Bicarbonate: 25.6 mmol/L (ref 20.0–28.0)
Bicarbonate: 22.1 mmol/L (ref 20.0–28.0)
O2 SAT: 100 %
O2 SAT: 100 %
O2 Saturation: 100 %
PCO2 ART: 40.2 mmHg (ref 32.0–48.0)
PH ART: 7.401 (ref 7.350–7.450)
PH ART: 7.415 (ref 7.350–7.450)
PO2 ART: 524 mmHg — AB (ref 83.0–108.0)
TCO2: 26 mmol/L (ref 0–100)
TCO2: 27 mmol/L (ref 0–100)
TCO2: 23 mmol/L (ref 0–100)
pCO2 arterial: 42.8 mmHg (ref 32.0–48.0)
pCO2 arterial: 34.4 mmHg (ref 32.0–48.0)
pH, Arterial: 7.385 (ref 7.350–7.450)
pO2, Arterial: 385 mmHg — ABNORMAL HIGH (ref 83.0–108.0)
pO2, Arterial: 365 mmHg — ABNORMAL HIGH (ref 83.0–108.0)

## 2016-03-30 LAB — POCT I-STAT, CHEM 8
BUN: 12 mg/dL (ref 6–20)
BUN: 12 mg/dL (ref 6–20)
BUN: 12 mg/dL (ref 6–20)
BUN: 9 mg/dL (ref 6–20)
BUN: 10 mg/dL (ref 6–20)
BUN: 11 mg/dL (ref 6–20)
BUN: 10 mg/dL (ref 6–20)
CALCIUM ION: 1.25 mmol/L (ref 1.15–1.40)
CALCIUM ION: 0.95 mmol/L — AB (ref 1.15–1.40)
CALCIUM ION: 1.07 mmol/L — AB (ref 1.15–1.40)
CALCIUM ION: 1.07 mmol/L — AB (ref 1.15–1.40)
CALCIUM ION: 1.04 mmol/L — AB (ref 1.15–1.40)
CHLORIDE: 104 mmol/L (ref 101–111)
CHLORIDE: 103 mmol/L (ref 101–111)
CHLORIDE: 102 mmol/L (ref 101–111)
CREATININE: 0.9 mg/dL (ref 0.44–1.00)
CREATININE: 0.7 mg/dL (ref 0.44–1.00)
CREATININE: 0.8 mg/dL (ref 0.44–1.00)
CREATININE: 0.8 mg/dL (ref 0.44–1.00)
Calcium, Ion: 1.23 mmol/L (ref 1.15–1.40)
Calcium, Ion: 1.19 mmol/L (ref 1.15–1.40)
Chloride: 103 mmol/L (ref 101–111)
Chloride: 101 mmol/L (ref 101–111)
Chloride: 99 mmol/L — ABNORMAL LOW (ref 101–111)
Chloride: 100 mmol/L — ABNORMAL LOW (ref 101–111)
Creatinine, Ser: 1 mg/dL (ref 0.44–1.00)
Creatinine, Ser: 1.1 mg/dL — ABNORMAL HIGH (ref 0.44–1.00)
Creatinine, Ser: 0.7 mg/dL (ref 0.44–1.00)
GLUCOSE: 119 mg/dL — AB (ref 65–99)
GLUCOSE: 151 mg/dL — AB (ref 65–99)
GLUCOSE: 209 mg/dL — AB (ref 65–99)
Glucose, Bld: 164 mg/dL — ABNORMAL HIGH (ref 65–99)
Glucose, Bld: 147 mg/dL — ABNORMAL HIGH (ref 65–99)
Glucose, Bld: 106 mg/dL — ABNORMAL HIGH (ref 65–99)
Glucose, Bld: 84 mg/dL (ref 65–99)
HCT: 22 % — ABNORMAL LOW (ref 36.0–46.0)
HCT: 22 % — ABNORMAL LOW (ref 36.0–46.0)
HEMATOCRIT: 30 % — AB (ref 36.0–46.0)
HEMATOCRIT: 27 % — AB (ref 36.0–46.0)
HEMATOCRIT: 27 % — AB (ref 36.0–46.0)
HEMATOCRIT: 21 % — AB (ref 36.0–46.0)
HEMATOCRIT: 26 % — AB (ref 36.0–46.0)
HEMOGLOBIN: 10.2 g/dL — AB (ref 12.0–15.0)
HEMOGLOBIN: 9.2 g/dL — AB (ref 12.0–15.0)
HEMOGLOBIN: 8.8 g/dL — AB (ref 12.0–15.0)
HEMOGLOBIN: 7.5 g/dL — AB (ref 12.0–15.0)
Hemoglobin: 9.2 g/dL — ABNORMAL LOW (ref 12.0–15.0)
Hemoglobin: 7.1 g/dL — ABNORMAL LOW (ref 12.0–15.0)
Hemoglobin: 7.5 g/dL — ABNORMAL LOW (ref 12.0–15.0)
POTASSIUM: 4.2 mmol/L (ref 3.5–5.1)
POTASSIUM: 3.9 mmol/L (ref 3.5–5.1)
Potassium: 4.3 mmol/L (ref 3.5–5.1)
Potassium: 4.2 mmol/L (ref 3.5–5.1)
Potassium: 4.5 mmol/L (ref 3.5–5.1)
Potassium: 4.8 mmol/L (ref 3.5–5.1)
Potassium: 4 mmol/L (ref 3.5–5.1)
SODIUM: 138 mmol/L (ref 135–145)
SODIUM: 138 mmol/L (ref 135–145)
SODIUM: 135 mmol/L (ref 135–145)
Sodium: 139 mmol/L (ref 135–145)
Sodium: 139 mmol/L (ref 135–145)
Sodium: 132 mmol/L — ABNORMAL LOW (ref 135–145)
Sodium: 134 mmol/L — ABNORMAL LOW (ref 135–145)
TCO2: 28 mmol/L (ref 0–100)
TCO2: 24 mmol/L (ref 0–100)
TCO2: 24 mmol/L (ref 0–100)
TCO2: 24 mmol/L (ref 0–100)
TCO2: 24 mmol/L (ref 0–100)
TCO2: 25 mmol/L (ref 0–100)
TCO2: 23 mmol/L (ref 0–100)

## 2016-03-30 LAB — BASIC METABOLIC PANEL
Anion gap: 10 (ref 5–15)
BUN: 21 mg/dL — ABNORMAL HIGH (ref 6–20)
CO2: 27 mmol/L (ref 22–32)
Calcium: 9.7 mg/dL (ref 8.9–10.3)
Chloride: 101 mmol/L (ref 101–111)
Creatinine, Ser: 1.14 mg/dL — ABNORMAL HIGH (ref 0.44–1.00)
GFR calc Af Amer: 50 mL/min — ABNORMAL LOW (ref >60)
GFR calc non Af Amer: 44 mL/min — ABNORMAL LOW (ref >60)
Glucose, Bld: 144 mg/dL — ABNORMAL HIGH (ref 65–99)
Potassium: 4.7 mmol/L (ref 3.5–5.1)
Sodium: 138 mmol/L (ref 135–145)

## 2016-03-30 LAB — POCT I-STAT 4, (NA,K, GLUC, HGB,HCT)
GLUCOSE: 134 mg/dL — AB (ref 65–99)
HCT: 28 % — ABNORMAL LOW (ref 36.0–46.0)
Hemoglobin: 9.5 g/dL — ABNORMAL LOW (ref 12.0–15.0)
POTASSIUM: 3.7 mmol/L (ref 3.5–5.1)
Sodium: 138 mmol/L (ref 135–145)

## 2016-03-30 LAB — GLUCOSE, CAPILLARY
GLUCOSE-CAPILLARY: 159 mg/dL — AB (ref 65–99)
GLUCOSE-CAPILLARY: 156 mg/dL — AB (ref 65–99)

## 2016-03-30 MED ORDER — METOPROLOL TARTRATE 25 MG PO TABS: 25.0000 mg | ORAL_TABLET | Freq: Two times a day (BID) | ORAL | 1 refills | Status: DC

## 2016-03-30 MED ORDER — TRAMADOL HCL 50 MG PO TABS: 50.0000 mg | ORAL_TABLET | Freq: Four times a day (QID) | ORAL | 0 refills | Status: DC | PRN

## 2016-03-30 MED ORDER — AMIODARONE HCL 200 MG PO TABS: 200.0000 mg | ORAL_TABLET | Freq: Two times a day (BID) | ORAL | 1 refills | Status: DC

## 2016-03-30 MED ORDER — ASPIRIN 81 MG PO TBEC: 81.0000 mg | DELAYED_RELEASE_TABLET | Freq: Every day | ORAL | Status: AC

## 2016-03-30 MED ORDER — ATORVASTATIN CALCIUM 40 MG PO TABS: 40.0000 mg | ORAL_TABLET | Freq: Every day | ORAL | 1 refills | Status: DC

## 2016-03-30 NOTE — Progress Notes (Signed)
CARDIAC REHAB PHASE I   Cardiac surgery discharge education completed with pt and son at bedside. Reviewed IS, sternal precautions, activity progression, exercise, heart healthy diet, carb counting, sodium restrictions, daily weights, s/s heart failure, and phase 2 cardiac rehab. Pt verbalized understanding. Pt agrees to phase 2 cardiac rehab referral, will send to [facility] per pt request. Pt in bed, call bell within reach, awaiting discharge.  [place], RN, BSN 03/30/2016 2:03 PM

## 2016-03-30 NOTE — Progress Notes (Addendum)
[address]       [place] [address]             [phone]      6 Days Post-Op Procedure(s) (LRB): CORONARY ARTERY BYPASS GRAFTING (CABG) x 4 utilizing left internal mammary artery and left greater saphenous vein, bilateral saphenous veins harvested endoscopicaly,  SVG to PD, LIMA  to LAD, SEQ SVG to OM1 and OM2 (N/A) TRANSESOPHAGEAL ECHOCARDIOGRAM (TEE) (N/A) Subjective: Feels well  Objective: Vital signs in last 24 hours: Temp:  [97.5 F (36.4 C)-98.9 F (37.2 C)] 97.5 F (36.4 C) (10/12 0529) Pulse Rate:  [77-90] 81 (10/12 0529) Cardiac Rhythm: Normal sinus rhythm (10/12 0700) Resp:  [18] 18 (10/12 0529) BP: (146-159)/(68-85) 159/70 (10/12 0529) SpO2:  [95 %-96 %] 96 % (10/12 0529) Weight:  [150 lb (68 kg)] 150 lb (68 kg) (10/12 0529)  Hemodynamic parameters for last 24 hours:    Intake/Output from previous day: 10/11 0701 - 10/12 0700 In: 956 [P.O.:956] Out: 400 [Urine:400] Intake/Output this shift: No intake/output data recorded.  General appearance: alert, cooperative and no distress Heart: regular rate and rhythm Lungs: clear to auscultation bilaterally Abdomen: benign Extremities: no edema Wound: incis healing well  Lab Results:  Recent Labs  03/29/16 0326  WBC 9.7  HGB 10.8*  HCT 33.8*  PLT 244   BMET:  Recent Labs  03/29/16 0840 03/30/16 0252  NA 138 138  K 4.6 4.7  CL 102 101  CO2 26 27  GLUCOSE 159* 144*  BUN 19 21*  CREATININE 1.28* 1.14*  CALCIUM 10.0 9.7    PT/INR: No results for input(s): LABPROT, INR in the last 72 hours. ABG    Component Value Date/Time   PHART 7.398 03/25/2016 0013   HCO3 23.4 03/25/2016 0013   TCO2 23 03/25/2016 1637   ACIDBASEDEF 1.0 03/25/2016 0013   O2SAT 99.0 03/25/2016 0013   CBG (last 3)   Recent Labs  03/29/16 1628 03/29/16 2032 03/30/16 0631  GLUCAP 171* 176* 159*    Meds Scheduled Meds: . amiodarone  200 mg Oral Q12H   Followed by  . [START ON 04/04/2016]  amiodarone  200 mg Oral Daily  . aspirin EC  81 mg Oral Daily  . atorvastatin  40 mg Oral q1800  . insulin aspart  0-24 Units Subcutaneous TID AC & HS  . insulin detemir  15 Units Subcutaneous Daily  . lisinopril  5 mg Oral Daily  . metFORMIN  1,000 mg Oral BID WC  . metoprolol tartrate  25 mg Oral BID  . pantoprazole  40 mg Oral QAC breakfast  . sodium chloride flush  3 mL Intravenous Q12H   Continuous Infusions:  PRN Meds:.sodium chloride, bisacodyl **OR** bisacodyl, magnesium hydroxide, ondansetron **OR** ondansetron (ZOFRAN) IV, polyvinyl alcohol, sodium chloride flush, traMADol  Xrays Dg Chest 2 View  Result Date: 03/29/2016 CLINICAL DATA:  Follow-up CABG from 5 days ago. EXAM: CHEST  2 VIEW COMPARISON:  Portable chest x-ray of March 27, 2016 FINDINGS: The lungs are reasonably well inflated. There is bowel pleural thickening in the minor fissure and small amount of pleural fluid at the right lung base. On the left there is subsegmental atelectasis and small effusion. The pulmonary interstitium has improved with resolution of interstitial edema. The cardiac silhouette is mildly enlarged. The pulmonary vascularity is not engorged. The Cordis sheath has been removed. The sternal wires are intact. There is calcification in the wall of the thoracic aorta. There is  multilevel degenerative disc disease of the thoracic spine. 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 IMPRESSION: Improving appearance of the chest with resolution of interstitial edema. Persistent small bilateral pleural effusions with bibasilar atelectasis. No pneumothorax. Electronically Signed   By: David  Martinique M.D.   On: 03/29/2016 09:00    Assessment/Plan: S/P Procedure(s) (LRB): CORONARY ARTERY BYPASS GRAFTING (CABG) x 4 utilizing left internal mammary artery and left greater saphenous vein, bilateral saphenous veins harvested endoscopicaly,  SVG to PD, LIMA  to LAD, SEQ SVG to OM1 and OM2  (N/A) TRANSESOPHAGEAL ECHOCARDIOGRAM (TEE) (N/A) Plan for discharge: see discharge orders Increase to home dose ACE-I    LOS: 8 days    GOLD,WAYNE E 03/30/2016 Plan d/c today I have seen and examined Andrea Wilkinson and agree with the above assessment  and plan.  Grace Isaac MD Beeper [phone] Office [phone] 03/30/2016 9:57 AM

## 2016-03-30 NOTE — Care Management Important Message (Signed)
Important Message  Patient Details  Name: Andrea Wilkinson MRN: [id number] Date of Birth: [birth year]   Medicare Important Message Given:  Yes    Nathen May 03/30/2016, 9:29 AM

## 2016-04-05 ENCOUNTER — Other Ambulatory Visit: Payer: Self-pay | Admitting: Internal Medicine

## 2016-04-05 MED ORDER — BLOOD GLUCOSE MONITOR KIT: PACK | 0 refills | Status: DC

## 2016-04-05 NOTE — Telephone Encounter (Signed)
This has been ordered 

## 2016-04-05 NOTE — Telephone Encounter (Signed)
Pt called in and said that she needs a acucheck meter, she said her has stop working   Estate agent

## 2016-04-06 NOTE — Telephone Encounter (Signed)
erx did not go through. PCP signed and rx has been faxed to [place].

## 2016-04-14 MED ORDER — ACCU-CHEK MULTICLIX LANCETS MISC: 3 refills | Status: DC

## 2016-04-14 MED ORDER — GLUCOSE BLOOD VI STRP: ORAL_STRIP | 3 refills | Status: DC

## 2016-04-14 NOTE — Addendum Note (Signed)
Addended by: Aviva Signs M on: 04/14/2016 03:04 PM   Modules accepted: Orders

## 2016-04-17 ENCOUNTER — Encounter: Payer: Self-pay | Admitting: Cardiology

## 2016-04-17 ENCOUNTER — Telehealth: Payer: Self-pay | Admitting: Emergency Medicine

## 2016-04-17 ENCOUNTER — Ambulatory Visit (INDEPENDENT_AMBULATORY_CARE_PROVIDER_SITE_OTHER): Payer: Commercial Managed Care - HMO | Admitting: Cardiology

## 2016-04-17 ENCOUNTER — Other Ambulatory Visit: Payer: Self-pay | Admitting: Internal Medicine

## 2016-04-17 DIAGNOSIS — I10 Essential (primary) hypertension: Secondary | ICD-10-CM

## 2016-04-17 MED ORDER — METOPROLOL TARTRATE 25 MG PO TABS: 37.5000 mg | ORAL_TABLET | Freq: Two times a day (BID) | ORAL | 1 refills | Status: DC

## 2016-04-17 NOTE — Telephone Encounter (Signed)
[facility] pharmacy is calling and wants to know how often and the quanity the doctor want for patients diabetic supplies. Please advise thanks.

## 2016-04-17 NOTE — Progress Notes (Signed)
04/17/2016 Andrea Wilkinson   Feb 03, 1932  [id number]  Primary Physician Scarlette Calico, MD Primary Cardiologist: Dr. Irish Lack    Reason for Visit/CC: Post hospital f/u for CAD s/p CABG  HPI:  Patient is a 82 year old female with a history of hypertension, diabetes, chronic kidney disease and recently diagnosed CAD  who presents to clinic today for post hospital follow-up. She initially presented in 03/22/2016 with complaint of chest discomfort. She ruled in for non-STEMI. Subsequent cardiac catheterization revealed critical 3 vessel obstructive CAD. Echo showed normal LVEF at 50-55%.  She ultimately required coronary artery bypass grafting  x 4 utilizing LIMA to LAD, Sequential SVG to OM 1 and OM2, and SVG to PDA.  She also underwent endoscopic harvest of greater saphenous from right leg and left thigh.  This was performed on 03/24/16, per Dr. Servando Snare. Postoperative course was complicated by atrial fibrillation however she was treated with amiodarone and converted to sinus rhythm prior to discharge.  She presents to clinic today for post hospital f/u. She has done well. No recurrent CP, dyspnea, nor palpitations. No orthopnea, PND nor LEE. She reports full medication compliance. EKG shows NSR. HR in the 80s. BP is moderately elevated in the 001V systolic.    Current Meds  Medication Sig  . amiodarone (PACERONE) 200 MG tablet Take 1 tablet (200 mg total) by mouth every 12 (twelve) hours. For 7 days then once daily  . aspirin EC 81 MG EC tablet Take 1 tablet (81 mg total) by mouth daily.  Marland Kitchen atorvastatin (LIPITOR) 40 MG tablet Take 1 tablet (40 mg total) by mouth daily at 6 PM.  . beta carotene w/minerals (OCUVITE) tablet Take 1 tablet by mouth daily.  . blood glucose meter kit and supplies KIT Use as directed to check blood sugar. DX: E11.9  . Blood Glucose Monitoring Suppl (ACCU-CHEK AVIVA PLUS) W/DEVICE KIT Use to check blood sugars daily  . enalapril (VASOTEC) 20 MG tablet Take 1 tablet (20 mg  total) by mouth daily.  Marland Kitchen glucose blood (ACCU-CHEK AVIVA PLUS) test strip Check sugar twice per day DX: E11.09  . Lancets (ACCU-CHEK MULTICLIX) lancets Check sugar twice per day DX: E11.09  . metFORMIN (GLUCOPHAGE) 1000 MG tablet Take 1 tablet (1,000 mg total) by mouth 2 (two) times daily with a meal.  . metoprolol tartrate (LOPRESSOR) 25 MG tablet Take 1.5 tablets (37.5 mg total) by mouth 2 (two) times daily.  Marland Kitchen omeprazole (PRILOSEC) 20 MG capsule Take 1 capsule (20 mg total) by mouth daily.  Marland Kitchen oxybutynin (DITROPAN-XL) 5 MG 24 hr tablet Take 1 tablet (5 mg total) by mouth daily. (Patient taking differently: Take 5 mg by mouth daily as needed (for frequency). )  . traMADol (ULTRAM) 50 MG tablet Take 1 tablet (50 mg total) by mouth every 6 (six) hours as needed for moderate pain.  . [DISCONTINUED] metoprolol tartrate (LOPRESSOR) 25 MG tablet Take 1 tablet (25 mg total) by mouth 2 (two) times daily.   Allergies  Allergen Reactions  . Aspirin Other (See Comments)    GI upset  . Codeine Nausea And Vomiting   Past Medical History:  Diagnosis Date  . Cancer ([place])    basal cell cancer on face  . Cervical spine arthritis ([place])   . Depression   . Diabetic retinopathy ([place])   . DJD (degenerative joint disease)   . DM (diabetes mellitus), type 2 ([place])    type 2  . Esophageal stricture   . GERD (gastroesophageal reflux disease)   .  History of hiatal hernia   . Hypertension    Family History  Problem Relation Age of Onset  . Diabetes Father    Past Surgical History:  Procedure Laterality Date  . ABDOMINAL HYSTERECTOMY    . AIR/FLUID EXCHANGE Left 08/10/2015   Procedure: AIR/FLUID EXCHANGE, left eye;  Surgeon: Hayden Pedro, MD;  Location: [facility];  Service: Ophthalmology;  Laterality: Left;  . benign tumor removed from left hip    . CARDIAC CATHETERIZATION N/A 03/22/2016   Procedure: Left Heart Cath and Coronary Angiography;  Surgeon: Jettie Booze, MD;  Location: [facility];   Service: Cardiovascular;  Laterality: N/A;  . CATARACT EXTRACTION, BILATERAL  July 2013   Left done on 12/18/11 and right done on 01/15/2012  . CHOLECYSTECTOMY    . CORONARY ARTERY BYPASS GRAFT N/A 03/24/2016   Procedure: CORONARY ARTERY BYPASS GRAFTING (CABG) x 4 utilizing left internal mammary artery and left greater saphenous vein, bilateral saphenous veins harvested endoscopicaly,  SVG to PD, LIMA  to LAD, SEQ SVG to OM1 and OM2;  Surgeon: Grace Isaac, MD;  Location: [facility];  Service: Open Heart Surgery;  Laterality: N/A;  . DILATION AND CURETTAGE OF UTERUS    . LASER PHOTO ABLATION Left 08/10/2015   Procedure: LASER PHOTO ABLATION, left eye;  Surgeon: Hayden Pedro, MD;  Location: [facility];  Service: Ophthalmology;  Laterality: Left;  . left breast lumpectomy    . MEMBRANE PEEL Left 08/10/2015   Procedure: MEMBRANE PEEL, left eye;  Surgeon: Hayden Pedro, MD;  Location: [facility];  Service: Ophthalmology;  Laterality: Left;  . PARS PLANA VITRECTOMY Left 08/10/2015   Procedure: PARS PLANA VITRECTOMY WITH 25 GAUGE, headscope laser;  Surgeon: Hayden Pedro, MD;  Location: [facility];  Service: Ophthalmology;  Laterality: Left;  . TEE WITHOUT CARDIOVERSION N/A 03/24/2016   Procedure: TRANSESOPHAGEAL ECHOCARDIOGRAM (TEE);  Surgeon: Grace Isaac, MD;  Location: [facility];  Service: Open Heart Surgery;  Laterality: N/A;  . TOTAL KNEE ARTHROPLASTY     left Jan '12; right May '12 - by Dr. Alvan Dame  . TUBAL LIGATION     Social History   Social History  . Marital status: Widowed    Spouse name: N/A  . Number of children: 4  . Years of education: N/A   Occupational History  . retired    Social History Main Topics  . Smoking status: Never Smoker  . Smokeless tobacco: Never Used  . Alcohol use No  . Drug use: No  . Sexual activity: Not Currently   Other Topics Concern  . Not on file   Social History Narrative   Married [phone]   5 sons- '56, '57, '59, '62, '70   Grandchildren 10, 6 great  grandchildren   Lives in own home, son lives with her. I- ADLs   Discussed end of life care: would not want resuscitation or being maintained with mechanical ventilation.   Daily caffeine use 1 cup of coffee per day     Review of Systems: General: negative for chills, fever, night sweats or weight changes.  Cardiovascular: negative for chest pain, dyspnea on exertion, edema, orthopnea, palpitations, paroxysmal nocturnal dyspnea or shortness of breath Dermatological: negative for rash Respiratory: negative for cough or wheezing Urologic: negative for hematuria Abdominal: negative for nausea, vomiting, diarrhea, bright red blood per rectum, melena, or hematemesis Neurologic: negative for visual changes, syncope, or dizziness All other systems reviewed and are otherwise negative except as noted above.  Physical Exam:  Blood pressure (!) 146/62, pulse 85, height 5' (1.524 m), weight 149 lb 8 oz (67.8 kg).  General appearance: alert, cooperative and no distress Neck: no carotid bruit and no JVD Lungs: clear to auscultation bilaterally Heart: regular rate and rhythm, S1, S2 normal, no murmur, click, rub or gallop Extremities: extremities normal, atraumatic, no cyanosis or edema Pulses: 2+ and symmetric Skin: Skin color, texture, turgor normal. No rashes or lesions Neurologic: Grossly normal  EKG NSR. 85 bpm.   ASSESSMENT AND PLAN:   1. CAD: s/p coronary artery bypass grafting  x 4 utilizing LIMA to LAD, Sequential SVG to OM 1 and OM2, and SVG to PDA. Stable w/o recurrent CP. No dyspnea. Continue medical therapy with ASA, statin, BB and ACE-I for secondary prevention.   2. HTN: moderately elevated. Increase metoprolol to 37.5 mg BID.   3. PAF: occurred in the post operative setting. Maintaining NSR on oral amiodarone. She is now on 200 mg daily. We will continue this short term. Can later d/c if no further recurrence at next office visit.   4. HLD: continue statin therapy with  Lipitor.   5. DM: on metformin. Followed by PCP.   PLAN  F/u with Dr. Irish Lack in 8 weeks.   Brittainy Simmons PA-C 04/17/2016 2:10 PM

## 2016-04-17 NOTE — Patient Instructions (Signed)
Medication Instructions:  Increase Metoprolol to 1.5 tab (37.5mg ) twice daily.   Follow-Up: Your physician recommends that you schedule a follow-up appointment in: 2 months with Dr. Irish Lack.  If you need a refill on your cardiac medications before your next appointment, please call your pharmacy.

## 2016-04-18 NOTE — Telephone Encounter (Signed)
Pt called stating she need Blood Glucose Monitoring Suppl (ACCU-CHEK AVIVA PLUS) send to [facility]. Please help

## 2016-04-18 NOTE — Telephone Encounter (Signed)
This has been sent in electronically. Will you advise pt please?

## 2016-04-18 NOTE — Telephone Encounter (Signed)
Advise pt that this was send in 04/04/16. Pt aware but she stating that [facility] has been trying to get in touch with [place] not sure what the reason was. FYI

## 2016-04-26 ENCOUNTER — Other Ambulatory Visit: Payer: Self-pay | Admitting: Cardiothoracic Surgery

## 2016-04-26 DIAGNOSIS — Z951 Presence of aortocoronary bypass graft: Secondary | ICD-10-CM

## 2016-04-27 ENCOUNTER — Ambulatory Visit (INDEPENDENT_AMBULATORY_CARE_PROVIDER_SITE_OTHER): Payer: Self-pay | Admitting: Cardiothoracic Surgery

## 2016-04-27 ENCOUNTER — Encounter: Payer: Self-pay | Admitting: Cardiothoracic Surgery

## 2016-04-27 ENCOUNTER — Ambulatory Visit
Admission: RE | Admit: 2016-04-27 | Discharge: 2016-04-27 | Disposition: A | Discharge status: Home / Self Care | Payer: Commercial Managed Care - HMO | Source: Ambulatory Visit | Attending: Cardiothoracic Surgery | Admitting: Cardiothoracic Surgery

## 2016-04-27 VITALS — BP 180/80 | HR 84 | Resp 20 | Ht 60.0 in | Wt 147.8 lb

## 2016-04-27 DIAGNOSIS — Z951 Presence of aortocoronary bypass graft: Secondary | ICD-10-CM

## 2016-04-27 DIAGNOSIS — R0602 Shortness of breath: Secondary | ICD-10-CM | POA: Diagnosis not present

## 2016-04-27 NOTE — Progress Notes (Signed)
[address]       [place] [address]             [phone]      Andrea Wilkinson [facility] Medical Record #[id number] Date of Birth: 08-03-1931  Referring: Jettie Booze, MD Primary Care: Scarlette Calico, MD  Chief Complaint:   POST OP FOLLOW UP 03/24/2016   OPERATIVE REPORT POSTOPERATIVE DIAGNOSIS:  Coronary occlusive disease with unstable angina and non-ST-elevation myocardial infarction. POSTOPERATIVE DIAGNOSIS:  Coronary occlusive disease with unstable angina and non-ST-elevation myocardial infarction. SURGICAL PROCEDURE:  Coronary artery bypass grafting x4 with left internal mammary to the left anterior descending coronary artery, sequential reverse saphenous vein graft to the first and second obtuse marginal coronary artery, reverse saphenous vein graft to the posterior descending coronary artery arising from the distal circumflex with right greater saphenous thigh and calf Endo vein harvesting and left thigh greater saphenous harvesting endoscopically. SURGEON:  Lanelle Bal, MD.  History of Present Illness:     Patient doing well post op, without symptoms of angina or CHF. She is increasing activity appropriately.    Past Medical History:  Diagnosis Date  . Cancer ([place])    basal cell cancer on face  . Cervical spine arthritis ([place])   . Depression   . Diabetic retinopathy ([place])   . DJD (degenerative joint disease)   . DM (diabetes mellitus), type 2 ([place])    type 2  . Esophageal stricture   . GERD (gastroesophageal reflux disease)   . History of hiatal hernia   . Hypertension      History  Smoking Status  . Never Smoker  Smokeless Tobacco  . Never Used    History  Alcohol Use No     Allergies  Allergen Reactions  . Aspirin Other (See Comments)    GI upset  . Codeine Nausea And Vomiting    Current Outpatient Prescriptions  Medication Sig Dispense Refill  . amiodarone (PACERONE) 200 MG tablet Take 1 tablet  (200 mg total) by mouth every 12 (twelve) hours. For 7 days then once daily 70 tablet 1  . aspirin EC 81 MG EC tablet Take 1 tablet (81 mg total) by mouth daily.    Marland Kitchen atorvastatin (LIPITOR) 40 MG tablet Take 1 tablet (40 mg total) by mouth daily at 6 PM. 30 tablet 1  . beta carotene w/minerals (OCUVITE) tablet Take 1 tablet by mouth daily.    . blood glucose meter kit and supplies KIT Use as directed to check blood sugar. DX: E11.9 1 each 0  . Blood Glucose Monitoring Suppl (ACCU-CHEK AVIVA PLUS) W/DEVICE KIT Use to check blood sugars daily 1 kit 0  . enalapril (VASOTEC) 20 MG tablet Take 1 tablet (20 mg total) by mouth daily. 90 tablet 3  . glucose blood (ACCU-CHEK AVIVA PLUS) test strip Check sugar twice per day DX: E11.09 200 each 3  . Lancets (ACCU-CHEK MULTICLIX) lancets Check sugar twice per day DX: E11.09 200 each 3  . metFORMIN (GLUCOPHAGE) 1000 MG tablet TAKE 1 TABLET TWICE DAILY WITH MEALS 180 tablet 3  . metoprolol tartrate (LOPRESSOR) 25 MG tablet Take 1.5 tablets (37.5 mg total) by mouth 2 (two) times daily. 270 tablet 1  . omeprazole (PRILOSEC) 20 MG capsule Take 1 capsule (20 mg total) by mouth daily. 90 capsule 3  . oxybutynin (DITROPAN-XL) 5 MG 24 hr tablet Take 1 tablet (5 mg total) by mouth daily. (Patient taking differently: Take 5 mg  by mouth daily as needed (for frequency). ) 90 tablet 3  . traMADol (ULTRAM) 50 MG tablet Take 1 tablet (50 mg total) by mouth every 6 (six) hours as needed for moderate pain. 30 tablet 0   No current facility-administered medications for this visit.        Physical Exam: BP (!) 180/80   Pulse 84   Resp 20   Ht 5' (1.524 m)   Wt 147 lb 12.8 oz (67 kg)   SpO2 94% Comment: RA  BMI 28.87 kg/m   General appearance: alert, cooperative, appears stated age and no distress Neurologic: intact Heart: regular rate and rhythm, S1, S2 normal, no murmur, click, rub or gallop Lungs: clear to auscultation bilaterally Abdomen: soft, non-tender;  bowel sounds normal; no masses,  no organomegaly Extremities: extremities normal, atraumatic, no cyanosis or edema Wound: stenum well healed and stable   Diagnostic Studies & Laboratory data:     Recent Radiology Findings:   Dg Chest 2 View  Result Date: 04/27/2016 CLINICAL DATA:  History CABG 03/24/2016. Fatigue, shortness of breath EXAM: CHEST  2 VIEW COMPARISON:  03/29/2016 FINDINGS: There is no focal parenchymal opacity. There is no pleural effusion or pneumothorax. The heart and mediastinal contours are unremarkable. There is evidence of prior CABG. The osseous structures are unremarkable. IMPRESSION: No active cardiopulmonary disease. Electronically Signed   By: Kathreen Devoid   On: 04/27/2016 14:06   I have independently reviewed the above radiology studies  and reviewed the findings with the patient.   Recent Lab Findings: Lab Results  Component Value Date   WBC 9.7 03/29/2016   HGB 10.8 (L) 03/29/2016   HCT 33.8 (L) 03/29/2016   PLT 244 03/29/2016   GLUCOSE 144 (H) 03/30/2016   CHOL 179 11/03/2015   TRIG 178.0 (H) 11/03/2015   HDL 45.20 11/03/2015   LDLDIRECT 90.0 05/05/2015   LDLCALC 98 11/03/2015   ALT 19 03/23/2016   AST 40 03/23/2016   NA 138 03/30/2016   K 4.7 03/30/2016   CL 101 03/30/2016   CREATININE 1.14 (H) 03/30/2016   BUN 21 (H) 03/30/2016   CO2 27 03/30/2016   TSH 0.907 03/27/2016   INR 1.49 03/24/2016   HGBA1C 7.3 (H) 03/24/2016      Assessment / Plan:   stable  Postop , Will start cardiac rehab, in near future Follow up 4 weeks    Grace Isaac MD      [address] [place] [address] Office [phone]   Beeper [phone]  04/27/2016 8:28 PM

## 2016-05-12 ENCOUNTER — Other Ambulatory Visit: Payer: Self-pay | Admitting: Surgical

## 2016-05-16 ENCOUNTER — Other Ambulatory Visit: Payer: Self-pay

## 2016-05-16 NOTE — Telephone Encounter (Signed)
Please refer to the pts PCP as he has been managing her hyperlipidemia. Thanks.

## 2016-05-19 ENCOUNTER — Ambulatory Visit: Payer: Commercial Managed Care - HMO | Admitting: Gastroenterology

## 2016-05-23 ENCOUNTER — Ambulatory Visit: Payer: Commercial Managed Care - HMO | Admitting: Internal Medicine

## 2016-05-25 ENCOUNTER — Ambulatory Visit (INDEPENDENT_AMBULATORY_CARE_PROVIDER_SITE_OTHER): Payer: Commercial Managed Care - HMO | Admitting: Ophthalmology

## 2016-05-25 ENCOUNTER — Ambulatory Visit (INDEPENDENT_AMBULATORY_CARE_PROVIDER_SITE_OTHER): Payer: Self-pay | Admitting: Cardiothoracic Surgery

## 2016-05-25 VITALS — BP 172/88 | HR 77 | Resp 20 | Ht 60.0 in | Wt 146.0 lb

## 2016-05-25 DIAGNOSIS — Z951 Presence of aortocoronary bypass graft: Secondary | ICD-10-CM

## 2016-05-25 MED ORDER — FUROSEMIDE 20 MG PO TABS: 20.0000 mg | ORAL_TABLET | Freq: Every day | ORAL | 1 refills | Status: DC

## 2016-05-25 NOTE — Progress Notes (Signed)
[address]       [place] [address]             [phone]      Andrea Wilkinson [facility] Medical Record #[id number] Date of Birth: 01/28/1932  Referring: Jettie Booze, MD Primary Care: Scarlette Calico, MD  Chief Complaint:   POST OP FOLLOW UP 03/24/2016   OPERATIVE REPORT POSTOPERATIVE DIAGNOSIS:  Coronary occlusive disease with unstable angina and non-ST-elevation myocardial infarction. POSTOPERATIVE DIAGNOSIS:  Coronary occlusive disease with unstable angina and non-ST-elevation myocardial infarction. SURGICAL PROCEDURE:  Coronary artery bypass grafting x4 with left internal mammary to the left anterior descending coronary artery, sequential reverse saphenous vein graft to the first and second obtuse marginal coronary artery, reverse saphenous vein graft to the posterior descending coronary artery arising from the distal circumflex with right greater saphenous thigh and calf Endo vein harvesting and left thigh greater saphenous harvesting endoscopically. SURGEON:  Lanelle Bal, MD.  History of Present Illness:     Patient returns today for follow-up visit after coronary artery bypass grafting in early October. She is making slow but steady progress. She has noted some mild pedal edema currently not on Lasix.    Past Medical History:  Diagnosis Date  . Cancer ([place])    basal cell cancer on face  . Cervical spine arthritis ([place])   . Depression   . Diabetic retinopathy ([place])   . DJD (degenerative joint disease)   . DM (diabetes mellitus), type 2 ([place])    type 2  . Esophageal stricture   . GERD (gastroesophageal reflux disease)   . History of hiatal hernia   . Hypertension      History  Smoking Status  . Never Smoker  Smokeless Tobacco  . Never Used    History  Alcohol Use No     Allergies  Allergen Reactions  . Aspirin Other (See Comments)    GI upset  . Codeine Nausea And Vomiting    Current Outpatient  Prescriptions  Medication Sig Dispense Refill  . amiodarone (PACERONE) 200 MG tablet Take 1 tablet (200 mg total) by mouth every 12 (twelve) hours. For 7 days then once daily 70 tablet 1  . aspirin EC 81 MG EC tablet Take 1 tablet (81 mg total) by mouth daily.    Marland Kitchen atorvastatin (LIPITOR) 40 MG tablet Take 1 tablet (40 mg total) by mouth daily at 6 PM. 30 tablet 1  . beta carotene w/minerals (OCUVITE) tablet Take 1 tablet by mouth daily.    . blood glucose meter kit and supplies KIT Use as directed to check blood sugar. DX: E11.9 1 each 0  . Blood Glucose Monitoring Suppl (ACCU-CHEK AVIVA PLUS) W/DEVICE KIT Use to check blood sugars daily 1 kit 0  . enalapril (VASOTEC) 20 MG tablet Take 1 tablet (20 mg total) by mouth daily. 90 tablet 3  . glucose blood (ACCU-CHEK AVIVA PLUS) test strip Check sugar twice per day DX: E11.09 200 each 3  . Lancets (ACCU-CHEK MULTICLIX) lancets Check sugar twice per day DX: E11.09 200 each 3  . metFORMIN (GLUCOPHAGE) 1000 MG tablet TAKE 1 TABLET TWICE DAILY WITH MEALS 180 tablet 3  . metoprolol tartrate (LOPRESSOR) 25 MG tablet Take 1.5 tablets (37.5 mg total) by mouth 2 (two) times daily. 270 tablet 1  . omeprazole (PRILOSEC) 20 MG capsule Take 1 capsule (20 mg total) by mouth daily. 90 capsule 3  . oxybutynin (DITROPAN-XL) 5 MG 24 hr  tablet Take 1 tablet (5 mg total) by mouth daily. (Patient taking differently: Take 5 mg by mouth daily as needed (for frequency). ) 90 tablet 3  . traMADol (ULTRAM) 50 MG tablet Take 1 tablet (50 mg total) by mouth every 6 (six) hours as needed for moderate pain. 30 tablet 0   No current facility-administered medications for this visit.        Physical Exam: BP (!) 172/88 (BP Location: Right Arm, Cuff Size: Normal)   Pulse 77   Resp 20   Ht 5' (1.524 m)   Wt 146 lb (66.2 kg)   SpO2 96%   BMI 28.51 kg/m   General appearance: alert, cooperative, appears stated age and no distress Neurologic: intact Heart: regular rate and  rhythm, S1, S2 normal, no murmur, click, rub or gallop Lungs: clear to auscultation bilaterally Abdomen: soft, non-tender; bowel sounds normal; no masses,  no organomegaly Extremities: extremities normal, atraumatic, no cyanosis or edema Wound: stenum well healed and stable   Diagnostic Studies & Laboratory data:     Recent Radiology Findings:   Dg Chest 2 View  Result Date: 04/27/2016 CLINICAL DATA:  History CABG 03/24/2016. Fatigue, shortness of breath EXAM: CHEST  2 VIEW COMPARISON:  03/29/2016 FINDINGS: There is no focal parenchymal opacity. There is no pleural effusion or pneumothorax. The heart and mediastinal contours are unremarkable. There is evidence of prior CABG. The osseous structures are unremarkable. IMPRESSION: No active cardiopulmonary disease. Electronically Signed   By: Kathreen Devoid   On: 04/27/2016 14:06  I have independently reviewed the above radiology studies  and reviewed the findings with the patient.   Recent Lab Findings: Lab Results  Component Value Date   WBC 9.7 03/29/2016   HGB 10.8 (L) 03/29/2016   HCT 33.8 (L) 03/29/2016   PLT 244 03/29/2016   GLUCOSE 144 (H) 03/30/2016   CHOL 179 11/03/2015   TRIG 178.0 (H) 11/03/2015   HDL 45.20 11/03/2015   LDLDIRECT 90.0 05/05/2015   LDLCALC 98 11/03/2015   ALT 19 03/23/2016   AST 40 03/23/2016   NA 138 03/30/2016   K 4.7 03/30/2016   CL 101 03/30/2016   CREATININE 1.14 (H) 03/30/2016   BUN 21 (H) 03/30/2016   CO2 27 03/30/2016   TSH 0.907 03/27/2016   INR 1.49 03/24/2016   HGBA1C 7.3 (H) 03/24/2016      Assessment / Plan:    Patient doing well following coronary artery bypass grafting With her elevated blood pressure and mild bilateral pedal edema she was restarted on Lasix 20 mg once a day We'll refer her to cardiac rehabilitation She has a follow-up appointment in cardiology in 3weeks Plan to see her back as needed  Grace Isaac MD      [address] [place]  [address] Office [phone]   Beeper [phone]  05/25/2016 12:51 PM

## 2016-05-26 ENCOUNTER — Other Ambulatory Visit: Payer: Self-pay | Admitting: *Deleted

## 2016-05-26 DIAGNOSIS — R600 Localized edema: Secondary | ICD-10-CM

## 2016-05-26 MED ORDER — FUROSEMIDE 20 MG PO TABS: 20.0000 mg | ORAL_TABLET | Freq: Every day | ORAL | 1 refills | Status: DC

## 2016-05-31 ENCOUNTER — Encounter: Payer: Self-pay | Admitting: Internal Medicine

## 2016-05-31 ENCOUNTER — Other Ambulatory Visit (INDEPENDENT_AMBULATORY_CARE_PROVIDER_SITE_OTHER): Payer: Commercial Managed Care - HMO

## 2016-05-31 ENCOUNTER — Ambulatory Visit (INDEPENDENT_AMBULATORY_CARE_PROVIDER_SITE_OTHER): Payer: Commercial Managed Care - HMO | Admitting: Internal Medicine

## 2016-05-31 VITALS — BP 208/80 | HR 64 | Temp 97.7°F | Resp 16 | Ht 60.0 in | Wt 144.8 lb

## 2016-05-31 DIAGNOSIS — I214 Non-ST elevation (NSTEMI) myocardial infarction: Secondary | ICD-10-CM

## 2016-05-31 DIAGNOSIS — I10 Essential (primary) hypertension: Secondary | ICD-10-CM | POA: Diagnosis not present

## 2016-05-31 DIAGNOSIS — D539 Nutritional anemia, unspecified: Secondary | ICD-10-CM

## 2016-05-31 LAB — THYROID PANEL WITH TSH
Free Thyroxine Index: 3.3 (ref 1.4–3.8)
T3 Uptake: 33 % (ref 22–35)
T4, Total: 9.9 ug/dL (ref 4.5–12.0)
TSH: 2.04 mIU/L

## 2016-05-31 LAB — CBC WITH DIFFERENTIAL/PLATELET
Basophils Absolute: 0.1 10*3/uL (ref 0.0–0.1)
Basophils Relative: 0.8 % (ref 0.0–3.0)
Eosinophils Absolute: 0.2 10*3/uL (ref 0.0–0.7)
Eosinophils Relative: 2.5 % (ref 0.0–5.0)
HCT: 40.7 % (ref 36.0–46.0)
Hemoglobin: 13.4 g/dL (ref 12.0–15.0)
Lymphocytes Relative: 29.2 % (ref 12.0–46.0)
Lymphs Abs: 2.6 10*3/uL (ref 0.7–4.0)
MCHC: 32.8 g/dL (ref 30.0–36.0)
MCV: 89.3 fl (ref 78.0–100.0)
Monocytes Absolute: 0.5 10*3/uL (ref 0.1–1.0)
Monocytes Relative: 5.6 % (ref 3.0–12.0)
Neutro Abs: 5.6 10*3/uL (ref 1.4–7.7)
Neutrophils Relative %: 61.9 % (ref 43.0–77.0)
Platelets: 304 10*3/uL (ref 150.0–400.0)
RBC: 4.56 Mil/uL (ref 3.87–5.11)
RDW: 16.1 % — ABNORMAL HIGH (ref 11.5–15.5)
WBC: 9.1 10*3/uL (ref 4.0–10.5)

## 2016-05-31 LAB — URINALYSIS, ROUTINE W REFLEX MICROSCOPIC
BILIRUBIN URINE: NEGATIVE
Hgb urine dipstick: NEGATIVE
Nitrite: NEGATIVE
PH: 5.5 (ref 5.0–8.0)
Urine Glucose: NEGATIVE
Urobilinogen, UA: 0.2 (ref 0.0–1.0)

## 2016-05-31 LAB — VITAMIN B12: VITAMIN B 12: 175 pg/mL — AB (ref 211–911)

## 2016-05-31 LAB — IBC PANEL
IRON: 75 ug/dL (ref 42–145)
Saturation Ratios: 20.1 % (ref 20.0–50.0)
TRANSFERRIN: 266 mg/dL (ref 212.0–360.0)

## 2016-05-31 LAB — BASIC METABOLIC PANEL
BUN: 20 mg/dL (ref 6–23)
CHLORIDE: 97 meq/L (ref 96–112)
CO2: 26 meq/L (ref 19–32)
CREATININE: 1.21 mg/dL — AB (ref 0.40–1.20)
Calcium: 9.9 mg/dL (ref 8.4–10.5)
GFR: 45.2 mL/min — ABNORMAL LOW (ref >60.00)
Glucose, Bld: 158 mg/dL — ABNORMAL HIGH (ref 70–99)
Potassium: 4.7 mEq/L (ref 3.5–5.1)
Sodium: 137 mEq/L (ref 135–145)

## 2016-05-31 LAB — FOLATE: Folate: >23.4 ng/mL (ref >5.9)

## 2016-05-31 LAB — FERRITIN: Ferritin: 22.1 ng/mL (ref 10.0–291.0)

## 2016-05-31 MED ORDER — CANDESARTAN CILEXETIL 32 MG PO TABS: 32.0000 mg | ORAL_TABLET | Freq: Every day | ORAL | 1 refills | Status: DC

## 2016-05-31 MED ORDER — CHLORTHALIDONE 25 MG PO TABS: 25.0000 mg | ORAL_TABLET | Freq: Every day | ORAL | 1 refills | Status: DC

## 2016-05-31 NOTE — Progress Notes (Addendum)
Pre visit review using our clinic review tool, if applicable. No additional management support is needed unless otherwise documented below in the visit note.   Addendum made to visit to show the weekly B12 injections ordered by Dr. Ronnald Ramp. His instructions are listed in the lab results.

## 2016-05-31 NOTE — Patient Instructions (Signed)
Hypertension Hypertension, commonly called high blood pressure, is when the force of blood pumping through your arteries is too strong. Your arteries are the blood vessels that carry blood from your heart throughout your body. A blood pressure reading consists of a higher number over a lower number, such as 110/72. The higher number (systolic) is the pressure inside your arteries when your heart pumps. The lower number (diastolic) is the pressure inside your arteries when your heart relaxes. Ideally you want your blood pressure below 120/80. Hypertension forces your heart to work harder to pump blood. Your arteries may become narrow or stiff. Having untreated or uncontrolled hypertension can cause heart attack, stroke, kidney disease, and other problems. What increases the risk? Some risk factors for high blood pressure are controllable. Others are not. Risk factors you cannot control include:  Race. You may be at higher risk if you are African American.  Age. Risk increases with age.  Gender. Men are at higher risk than women before age 45 years. After age 65, women are at higher risk than men. Risk factors you can control include:  Not getting enough exercise or physical activity.  Being overweight.  Getting too much fat, sugar, calories, or salt in your diet.  Drinking too much alcohol. What are the signs or symptoms? Hypertension does not usually cause signs or symptoms. Extremely high blood pressure (hypertensive crisis) may cause headache, anxiety, shortness of breath, and nosebleed. How is this diagnosed? To check if you have hypertension, your health care provider will measure your blood pressure while you are seated, with your arm held at the level of your heart. It should be measured at least twice using the same arm. Certain conditions can cause a difference in blood pressure between your right and left arms. A blood pressure reading that is higher than normal on one occasion does  not mean that you need treatment. If it is not clear whether you have high blood pressure, you may be asked to return on a different day to have your blood pressure checked again. Or, you may be asked to monitor your blood pressure at home for 1 or more weeks. How is this treated? Treating high blood pressure includes making lifestyle changes and possibly taking medicine. Living a healthy lifestyle can help lower high blood pressure. You may need to change some of your habits. Lifestyle changes may include:  Following the DASH diet. This diet is high in fruits, vegetables, and whole grains. It is low in salt, red meat, and added sugars.  Keep your sodium intake below 2,300 mg per day.  Getting at least 30-45 minutes of aerobic exercise at least 4 times per week.  Losing weight if necessary.  Not smoking.  Limiting alcoholic beverages.  Learning ways to reduce stress. Your health care provider may prescribe medicine if lifestyle changes are not enough to get your blood pressure under control, and if one of the following is true:  You are 18-59 years of age and your systolic blood pressure is above 140.  You are 60 years of age or older, and your systolic blood pressure is above 150.  Your diastolic blood pressure is above 90.  You have diabetes, and your systolic blood pressure is over 140 or your diastolic blood pressure is over 90.  You have kidney disease and your blood pressure is above 140/90.  You have heart disease and your blood pressure is above 140/90. Your personal target blood pressure may vary depending on your medical   conditions, your age, and other factors. Follow these instructions at home:  Have your blood pressure rechecked as directed by your health care provider.  Take medicines only as directed by your health care provider. Follow the directions carefully. Blood pressure medicines must be taken as prescribed. The medicine does not work as well when you skip  doses. Skipping doses also puts you at risk for problems.  Do not smoke.  Monitor your blood pressure at home as directed by your health care provider. Contact a health care provider if:  You think you are having a reaction to medicines taken.  You have recurrent headaches or feel dizzy.  You have swelling in your ankles.  You have trouble with your vision. Get help right away if:  You develop a severe headache or confusion.  You have unusual weakness, numbness, or feel faint.  You have severe chest or abdominal pain.  You vomit repeatedly.  You have trouble breathing. This information is not intended to replace advice given to you by your health care provider. Make sure you discuss any questions you have with your health care provider. Document Released: 06/05/2005 Document Revised: 11/11/2015 Document Reviewed: 03/28/2013 Elsevier Interactive Patient Education  2017 Elsevier Inc.  

## 2016-05-31 NOTE — Progress Notes (Signed)
Subjective:  Patient ID: Andrea Wilkinson, female    DOB: 1932/05/06  Age: 82 y.o. MRN: [id number]  CC: Hypertension   HPI Meryle Pugmire Yonts presents for follow-up on hypertension. She is taking a low-dose of a loop diuretic and an ACE inhibitor. Her blood pressure has not been well controlled at home but fortunately, she denies headache/blurred vision/chest pain/shortness of breath/edema/palpitations.  Outpatient Medications Prior to Visit  Medication Sig Dispense Refill  . amiodarone (PACERONE) 200 MG tablet Take 1 tablet (200 mg total) by mouth every 12 (twelve) hours. For 7 days then once daily 70 tablet 1  . aspirin EC 81 MG EC tablet Take 1 tablet (81 mg total) by mouth daily.    Marland Kitchen atorvastatin (LIPITOR) 40 MG tablet Take 1 tablet (40 mg total) by mouth daily at 6 PM. 30 tablet 1  . beta carotene w/minerals (OCUVITE) tablet Take 1 tablet by mouth daily.    . blood glucose meter kit and supplies KIT Use as directed to check blood sugar. DX: E11.9 1 each 0  . Blood Glucose Monitoring Suppl (ACCU-CHEK AVIVA PLUS) W/DEVICE KIT Use to check blood sugars daily 1 kit 0  . glucose blood (ACCU-CHEK AVIVA PLUS) test strip Check sugar twice per day DX: E11.09 200 each 3  . Lancets (ACCU-CHEK MULTICLIX) lancets Check sugar twice per day DX: E11.09 200 each 3  . metFORMIN (GLUCOPHAGE) 1000 MG tablet TAKE 1 TABLET TWICE DAILY WITH MEALS 180 tablet 3  . metoprolol tartrate (LOPRESSOR) 25 MG tablet Take 1.5 tablets (37.5 mg total) by mouth 2 (two) times daily. 270 tablet 1  . omeprazole (PRILOSEC) 20 MG capsule Take 1 capsule (20 mg total) by mouth daily. 90 capsule 3  . oxybutynin (DITROPAN-XL) 5 MG 24 hr tablet Take 1 tablet (5 mg total) by mouth daily. (Patient taking differently: Take 5 mg by mouth daily as needed (for frequency). ) 90 tablet 3  . traMADol (ULTRAM) 50 MG tablet Take 1 tablet (50 mg total) by mouth every 6 (six) hours as needed for moderate pain. 30 tablet 0  . enalapril (VASOTEC) 20 MG  tablet Take 1 tablet (20 mg total) by mouth daily. 90 tablet 3  . furosemide (LASIX) 20 MG tablet Take 1 tablet (20 mg total) by mouth daily. 30 tablet 1   No facility-administered medications prior to visit.     ROS Review of Systems  Constitutional: Negative for activity change, appetite change, chills, fatigue and unexpected weight change.  HENT: Negative.   Eyes: Negative.  Negative for photophobia and visual disturbance.  Respiratory: Negative for cough, chest tightness, shortness of breath, wheezing and stridor.   Cardiovascular: Negative for chest pain, palpitations and leg swelling.  Gastrointestinal: Negative for abdominal pain, constipation, diarrhea, nausea and vomiting.  Endocrine: Negative.   Genitourinary: Negative.  Negative for difficulty urinating, dysuria and hematuria.  Musculoskeletal: Negative.  Negative for arthralgias, back pain, myalgias and neck pain.  Skin: Negative.  Negative for color change and rash.  Allergic/Immunologic: Negative.   Neurological: Positive for weakness. Negative for dizziness, numbness and headaches.       She feels weak all over  Hematological: Negative.  Negative for adenopathy. Does not bruise/bleed easily.  Psychiatric/Behavioral: Negative.     Objective:  BP (!) 208/80 (BP Location: Left Arm, Patient Position: Sitting, Cuff Size: Normal)   Pulse 64   Temp 97.7 F (36.5 C) (Oral)   Resp 16   Ht 5' (1.524 m)   Wt 144 lb  12 oz (65.7 kg)   SpO2 96%   BMI 28.27 kg/m   BP Readings from Last 3 Encounters:  05/31/16 (!) 208/80  05/25/16 (!) 172/88  04/27/16 (!) 180/80    Wt Readings from Last 3 Encounters:  05/31/16 144 lb 12 oz (65.7 kg)  05/25/16 146 lb (66.2 kg)  04/27/16 147 lb 12.8 oz (67 kg)    Physical Exam  Constitutional: She is oriented to person, place, and time. She appears well-developed and well-nourished. No distress.  HENT:  Mouth/Throat: Oropharynx is clear and moist. No oropharyngeal exudate.  Eyes:  Conjunctivae are normal. Right eye exhibits no discharge. Left eye exhibits no discharge. No scleral icterus.  Neck: Neck supple. No JVD present. No tracheal deviation present. No thyromegaly present.  Cardiovascular: Normal rate, normal heart sounds and intact distal pulses.  Exam reveals no gallop and no friction rub.   No murmur heard. Pulmonary/Chest: Effort normal and breath sounds normal. No stridor. No respiratory distress. She has no wheezes. She has no rales. She exhibits no tenderness.  Abdominal: Soft. Bowel sounds are normal. She exhibits no distension and no mass. There is no tenderness. There is no rebound and no guarding.  Musculoskeletal: Normal range of motion. She exhibits no edema, tenderness or deformity.  Lymphadenopathy:    She has no cervical adenopathy.  Neurological: She is oriented to person, place, and time.  Skin: Skin is warm and dry. No rash noted. She is not diaphoretic. No erythema. No pallor.  Psychiatric: She has a normal mood and affect. Her behavior is normal. Judgment and thought content normal.  Vitals reviewed.   Lab Results  Component Value Date   WBC 9.1 05/31/2016   HGB 13.4 05/31/2016   HCT 40.7 05/31/2016   PLT 304.0 05/31/2016   GLUCOSE 158 (H) 05/31/2016   CHOL 179 11/03/2015   TRIG 178.0 (H) 11/03/2015   HDL 45.20 11/03/2015   LDLDIRECT 90.0 05/05/2015   LDLCALC 98 11/03/2015   ALT 19 03/23/2016   AST 40 03/23/2016   NA 137 05/31/2016   K 4.7 05/31/2016   CL 97 05/31/2016   CREATININE 1.21 (H) 05/31/2016   BUN 20 05/31/2016   CO2 26 05/31/2016   TSH 2.04 05/31/2016   INR 1.49 03/24/2016   HGBA1C 7.3 (H) 03/24/2016   MICROALBUR 1.1 11/03/2015    Dg Chest 2 View  Result Date: 04/27/2016 CLINICAL DATA:  History CABG 03/24/2016. Fatigue, shortness of breath EXAM: CHEST  2 VIEW COMPARISON:  03/29/2016 FINDINGS: There is no focal parenchymal opacity. There is no pleural effusion or pneumothorax. The heart and mediastinal contours are  unremarkable. There is evidence of prior CABG. The osseous structures are unremarkable. IMPRESSION: No active cardiopulmonary disease. Electronically Signed   By: Kathreen Devoid   On: 04/27/2016 14:06    Assessment & Plan:   Darrah was seen today for hypertension.  Diagnoses and all orders for this visit:  Essential hypertension- her blood pressure is not well controlled on the loop diuretic and ACE inhibitor so I will change her to a more potent diuretic with the thiazide and an ARB for better blood pressure control. -     Basic metabolic panel; Future -     Urinalysis, Routine w reflex microscopic; Future -     Thyroid Panel With TSH; Future -     chlorthalidone (HYGROTON) 25 MG tablet; Take 1 tablet (25 mg total) by mouth daily. -     candesartan (ATACAND) 32 MG tablet; Take 1  tablet (32 mg total) by mouth daily.  Deficiency anemia- her H&H have improved but she is deficient in vitamin B12 so I have asked her to return for B12 injections -     CBC with Differential/Platelet; Future -     IBC panel; Future -     Ferritin; Future -     Folate; Future -     Vitamin B12; Future  NSTEMI (non-ST elevated myocardial infarction) ([place])- I've asked her to start an ARB for cardiovascular risk reductions. -     candesartan (ATACAND) 32 MG tablet; Take 1 tablet (32 mg total) by mouth daily.   I have discontinued Ms. Potempa's enalapril and furosemide. I am also having her start on chlorthalidone and candesartan. Additionally, I am having her maintain her ACCU-CHEK AVIVA PLUS, oxybutynin, beta carotene w/minerals, omeprazole, amiodarone, aspirin, atorvastatin, traMADol, blood glucose meter kit and supplies, accu-chek multiclix, glucose blood, metoprolol tartrate, and metFORMIN.  Meds ordered this encounter  Medications  . chlorthalidone (HYGROTON) 25 MG tablet    Sig: Take 1 tablet (25 mg total) by mouth daily.    Dispense:  90 tablet    Refill:  1  . candesartan (ATACAND) 32 MG tablet    Sig:  Take 1 tablet (32 mg total) by mouth daily.    Dispense:  90 tablet    Refill:  1     Follow-up: Return in about 6 weeks (around 07/12/2016).  Scarlette Calico, MD

## 2016-06-01 ENCOUNTER — Encounter: Payer: Self-pay | Admitting: Internal Medicine

## 2016-06-02 ENCOUNTER — Ambulatory Visit (INDEPENDENT_AMBULATORY_CARE_PROVIDER_SITE_OTHER): Payer: Commercial Managed Care - HMO | Admitting: Ophthalmology

## 2016-06-02 ENCOUNTER — Ambulatory Visit (INDEPENDENT_AMBULATORY_CARE_PROVIDER_SITE_OTHER): Payer: Commercial Managed Care - HMO

## 2016-06-02 ENCOUNTER — Encounter: Payer: Self-pay | Admitting: Interventional Cardiology

## 2016-06-02 DIAGNOSIS — D539 Nutritional anemia, unspecified: Secondary | ICD-10-CM | POA: Diagnosis not present

## 2016-06-02 MED ORDER — CYANOCOBALAMIN 1000 MCG/ML IJ SOLN
1000.0000 ug | INTRAMUSCULAR | Status: AC
  Administered 2016-06-02: 1000 ug via INTRAMUSCULAR

## 2016-06-02 NOTE — Addendum Note (Signed)
Addended by: Aviva Signs M on: 06/02/2016 01:52 PM   Modules accepted: Orders

## 2016-06-09 ENCOUNTER — Ambulatory Visit: Payer: Commercial Managed Care - HMO

## 2016-06-09 DIAGNOSIS — E538 Deficiency of other specified B group vitamins: Secondary | ICD-10-CM

## 2016-06-09 MED ORDER — CYANOCOBALAMIN 1000 MCG/ML IJ SOLN: 1000.0000 ug | Freq: Once | INTRAMUSCULAR | Status: DC

## 2016-06-16 ENCOUNTER — Ambulatory Visit: Payer: Commercial Managed Care - HMO

## 2016-06-20 ENCOUNTER — Ambulatory Visit: Payer: Commercial Managed Care - HMO

## 2016-06-22 ENCOUNTER — Ambulatory Visit: Payer: Commercial Managed Care - HMO | Admitting: Interventional Cardiology

## 2016-07-20 NOTE — Progress Notes (Signed)
Cardiology Office Note   Date:  07/21/2016   ID:  [name], DOB 12/04/1931, MRN [id number]  PCP:  Andrea Calico, MD    No chief complaint on file.    Wt Readings from Last 3 Encounters:  07/21/16 143 lb 12.8 oz (65.2 kg)  05/31/16 144 lb 12 oz (65.7 kg)  05/25/16 146 lb (66.2 kg)       History of Present Illness: Andrea Wilkinson is a 83 y.o. female  with a history of hypertension, diabetes, chronic kidney disease and  CAD.    She initially presented in 03/22/2016 with complaint of chest discomfort. She ruled in for non-STEMI. Subsequent cardiac catheterization revealed critical 3 vessel obstructive CAD. Echo showed normal LVEF at 50-55%.  She ultimately required coronary artery bypass grafting  x 4 utilizing LIMA to LAD, Sequential SVG to OM 1 and OM2, and SVG to PDA. She also underwent harvest of greater saphenous from right leg and left thigh. This was performed on 03/24/16, per Dr. Servando Wilkinson. Postoperative course was complicated by atrial fibrillation however she was treated with amiodarone and converted to sinus rhythm prior to discharge.  Amiodarone was thought to be a short term medicine.  She feels fatigued.  She does not sleep well.  She would like to come off of some meds.  BP at home are in the 546F systolic.  No CP or palpitations.    She walks in the house.  SHe did not do cardiac rehab.  She does not drive.      Past Medical History:  Diagnosis Date  . Cancer ([place])    basal cell cancer on face  . Cervical spine arthritis ([place])   . Depression   . Diabetic retinopathy ([place])   . DJD (degenerative joint disease)   . DM (diabetes mellitus), type 2 ([place])    type 2  . Esophageal stricture   . GERD (gastroesophageal reflux disease)   . History of hiatal hernia   . Hypertension     Past Surgical History:  Procedure Laterality Date  . ABDOMINAL HYSTERECTOMY    . AIR/FLUID EXCHANGE Left 08/10/2015   Procedure: AIR/FLUID EXCHANGE, left eye;  Surgeon: Andrea Pedro, MD;  Location: [facility];  Service: Ophthalmology;  Laterality: Left;  . benign tumor removed from left hip    . CARDIAC CATHETERIZATION N/A 03/22/2016   Procedure: Left Heart Cath and Coronary Angiography;  Surgeon: Andrea Booze, MD;  Location: [facility];  Service: Cardiovascular;  Laterality: N/A;  . CATARACT EXTRACTION, BILATERAL  July 2013   Left done on 12/18/11 and right done on 01/15/2012  . CHOLECYSTECTOMY    . CORONARY ARTERY BYPASS GRAFT N/A 03/24/2016   Procedure: CORONARY ARTERY BYPASS GRAFTING (CABG) x 4 utilizing left internal mammary artery and left greater saphenous vein, bilateral saphenous veins harvested endoscopicaly,  SVG to PD, LIMA  to LAD, SEQ SVG to OM1 and OM2;  Surgeon: Andrea Isaac, MD;  Location: [facility];  Service: Open Heart Surgery;  Laterality: N/A;  . DILATION AND CURETTAGE OF UTERUS    . LASER PHOTO ABLATION Left 08/10/2015   Procedure: LASER PHOTO ABLATION, left eye;  Surgeon: Andrea Pedro, MD;  Location: [facility];  Service: Ophthalmology;  Laterality: Left;  . left breast lumpectomy    . MEMBRANE PEEL Left 08/10/2015   Procedure: MEMBRANE PEEL, left eye;  Surgeon: Andrea Pedro, MD;  Location: [facility];  Service: Ophthalmology;  Laterality: Left;  .  PARS PLANA VITRECTOMY Left 08/10/2015   Procedure: PARS PLANA VITRECTOMY WITH 25 GAUGE, headscope laser;  Surgeon: Andrea Pedro, MD;  Location: [facility];  Service: Ophthalmology;  Laterality: Left;  . TEE WITHOUT CARDIOVERSION N/A 03/24/2016   Procedure: TRANSESOPHAGEAL ECHOCARDIOGRAM (TEE);  Surgeon: Andrea Isaac, MD;  Location: [facility];  Service: Open Heart Surgery;  Laterality: N/A;  . TOTAL KNEE ARTHROPLASTY     left Jan '12; right May '12 - by Dr. Alvan Wilkinson  . TUBAL LIGATION       Current Outpatient Prescriptions  Medication Sig Dispense Refill  . amiodarone (PACERONE) 200 MG tablet Take 200 mg by mouth daily.    Marland Kitchen aspirin EC 81 MG EC tablet Take 1 tablet (81 mg total) by mouth daily.    Marland Kitchen  atorvastatin (LIPITOR) 40 MG tablet Take 1 tablet (40 mg total) by mouth daily at 6 PM. 30 tablet 1  . blood glucose meter kit and supplies KIT Use as directed to check blood sugar. DX: E11.9 1 each 0  . Blood Glucose Monitoring Suppl (ACCU-CHEK AVIVA PLUS) W/DEVICE KIT Use to check blood sugars daily 1 kit 0  . enalapril (VASOTEC) 20 MG tablet Take 20 mg by mouth daily.    . furosemide (LASIX) 20 MG tablet Take 20 mg by mouth daily.    Marland Kitchen glucose blood (ACCU-CHEK AVIVA PLUS) test strip Check sugar twice per day DX: E11.09 200 each 3  . Lancets (ACCU-CHEK MULTICLIX) lancets Check sugar twice per day DX: E11.09 200 each 3  . meloxicam (MOBIC) 15 MG tablet Take 15 mg by mouth daily.    . metFORMIN (GLUCOPHAGE) 1000 MG tablet TAKE 1 TABLET TWICE DAILY WITH MEALS 180 tablet 3  . metoprolol tartrate (LOPRESSOR) 25 MG tablet Take 1.5 tablets (37.5 mg total) by mouth 2 (two) times daily. 270 tablet 1  . Multiple Vitamins-Minerals (PRESERVISION AREDS 2 PO) Take 1 capsule by mouth 2 (two) times daily.    Marland Kitchen omeprazole (PRILOSEC) 20 MG capsule Take 1 capsule (20 mg total) by mouth daily. 90 capsule 3  . oxybutynin (DITROPAN-XL) 5 MG 24 hr tablet Take 1 tablet (5 mg total) by mouth daily. (Patient taking differently: Take 5 mg by mouth daily as needed (for frequency). ) 90 tablet 3  . traMADol (ULTRAM) 50 MG tablet Take 1 tablet (50 mg total) by mouth every 6 (six) hours as needed for moderate pain. 30 tablet 0   No current facility-administered medications for this visit.     Allergies:   Aspirin and Codeine    Social History:  The patient  reports that she has never smoked. She has never used smokeless tobacco. She reports that she does not drink alcohol or use drugs.   Family History:  The patient's family history includes Diabetes in her father.    ROS:  Please see the history of present illness.   Otherwise, review of systems are positive for fatigue.   All other systems are reviewed and negative.      PHYSICAL EXAM: VS:  BP (!) 150/72 (BP Location: Left Arm)   Pulse 68   Ht 5' (1.524 m)   Wt 143 lb 12.8 oz (65.2 kg)   BMI 28.08 kg/m  , BMI Body mass index is 28.08 kg/m. GEN: Well nourished, well developed, in no acute distress  HEENT: normal  Neck: no JVD, carotid bruits, or masses Cardiac: RRR; no murmurs, rubs, or gallops,no edema  Respiratory:  clear to auscultation bilaterally, normal work  of breathing GI: soft, nontender, nondistended, + BS MS: no deformity or atrophy  Skin: warm and dry, no rash Neuro:  Strength and sensation are intact Psych: euthymic mood, full affect     Recent Labs: 03/23/2016: ALT 19 03/27/2016: Magnesium 2.1 05/31/2016: BUN 20; Creatinine, Ser 1.21; Hemoglobin 13.4; Platelets 304.0; Potassium 4.7; Sodium 137; TSH 2.04   Lipid Panel    Component Value Date/Time   CHOL 179 11/03/2015 0943   TRIG 178.0 (H) 11/03/2015 0943   HDL 45.20 11/03/2015 0943   CHOLHDL 4 11/03/2015 0943   VLDL 35.6 11/03/2015 0943   LDLCALC 98 11/03/2015 0943   LDLDIRECT 90.0 05/05/2015 1027     Other studies Reviewed: Additional studies/ records that were reviewed today with results demonstrating: CABG report reviewed/ hospital records.   ASSESSMENT AND PLAN:  1. CAD: No angina. COntinue low dose aspirin along with aggressive secondary prevention.  2. AFib: Stop Amiodarone.  Maintaining NSR at this time.  No palpitations.   3. HTN: Borderline control.  Would not increase BP meds due to fatigue at this time.   4. DM: Continue preventive therapy.  F/u with Dr. Ronnald Ramp. 5. Hyperlipidemia: Continue lipitor.  Dr. Ronnald Ramp to recheck lipids in May-June of 2018.    Current medicines are reviewed at length with the patient today.  The patient concerns regarding her medicines were addressed.  The following changes have been made:  Stop amio  Labs/ tests ordered today include:  No orders of the defined types were placed in this encounter.   Recommend 150  minutes/week of aerobic exercise Low fat, low carb, high fiber diet recommended  Disposition:   FU in 4 months   Signed, Larae Grooms, MD  07/21/2016 2:04 [facility] [place], [place], [place]  [address] Phone: [phone]; Fax: [phone]

## 2016-07-21 ENCOUNTER — Encounter: Payer: Self-pay | Admitting: Interventional Cardiology

## 2016-07-21 ENCOUNTER — Encounter (INDEPENDENT_AMBULATORY_CARE_PROVIDER_SITE_OTHER): Payer: Self-pay

## 2016-07-21 ENCOUNTER — Ambulatory Visit (INDEPENDENT_AMBULATORY_CARE_PROVIDER_SITE_OTHER): Payer: Medicare HMO | Admitting: Interventional Cardiology

## 2016-07-21 VITALS — BP 150/72 | HR 68 | Ht 60.0 in | Wt 143.8 lb

## 2016-07-21 DIAGNOSIS — I2510 Atherosclerotic heart disease of native coronary artery without angina pectoris: Secondary | ICD-10-CM | POA: Diagnosis not present

## 2016-07-21 DIAGNOSIS — E785 Hyperlipidemia, unspecified: Secondary | ICD-10-CM

## 2016-07-21 DIAGNOSIS — I10 Essential (primary) hypertension: Secondary | ICD-10-CM

## 2016-07-21 DIAGNOSIS — R5383 Other fatigue: Secondary | ICD-10-CM

## 2016-07-21 NOTE — Patient Instructions (Signed)
**Note De-Identified  Obfuscation** Medication Instructions:  Stop taking Amiodarone. All other medications remain the same.  Labwork: None  Testing/Procedures: None  Follow Up: In 4 months.     If you need a refill on your cardiac medications before your next appointment, please call your pharmacy.

## 2016-07-26 ENCOUNTER — Other Ambulatory Visit: Payer: Self-pay | Admitting: Internal Medicine

## 2016-08-02 ENCOUNTER — Ambulatory Visit (INDEPENDENT_AMBULATORY_CARE_PROVIDER_SITE_OTHER): Payer: Medicare HMO

## 2016-08-02 ENCOUNTER — Telehealth: Payer: Self-pay | Admitting: Internal Medicine

## 2016-08-02 ENCOUNTER — Other Ambulatory Visit: Payer: Self-pay | Admitting: Internal Medicine

## 2016-08-02 DIAGNOSIS — D518 Other vitamin B12 deficiency anemias: Secondary | ICD-10-CM

## 2016-08-02 DIAGNOSIS — E538 Deficiency of other specified B group vitamins: Secondary | ICD-10-CM | POA: Diagnosis not present

## 2016-08-02 MED ORDER — CYANOCOBALAMIN 1000 MCG/ML IJ SOLN
1000.0000 ug | Freq: Once | INTRAMUSCULAR | Status: AC
  Administered 2016-08-02: 1000 ug via INTRAMUSCULAR

## 2016-08-02 MED ORDER — CYANOCOBALAMIN 2000 MCG PO TABS: 2000.0000 ug | ORAL_TABLET | Freq: Every day | ORAL | 3 refills | Status: DC

## 2016-08-02 NOTE — Telephone Encounter (Signed)
Pt complete her 3rd round of B12. Please advise on what to do next.

## 2016-08-02 NOTE — Telephone Encounter (Signed)
Pt aware to take this med, advise pt that this is different from iron pill and it should not mess with her stomach but if it is then call our office.

## 2016-08-02 NOTE — Telephone Encounter (Signed)
Start oral QD b12 supplement Rx sent to her pharmacy

## 2016-08-04 ENCOUNTER — Telehealth: Payer: Self-pay | Admitting: *Deleted

## 2016-08-04 MED ORDER — ENALAPRIL MALEATE 20 MG PO TABS
20.0000 mg | ORAL_TABLET | Freq: Every day | ORAL | 0 refills | Status: DC
Start: 2016-08-04 — End: 2017-01-02

## 2016-08-04 MED ORDER — ENALAPRIL MALEATE 20 MG PO TABS: 20.0000 mg | ORAL_TABLET | Freq: Every day | ORAL | 1 refills | Status: DC

## 2016-08-04 NOTE — Telephone Encounter (Signed)
Rec'd call pt states she is completely out of her enalapril. Needing rx sent to [facility] for 90 day and temporary rx to [facility]. Inform pt will send to both pharmacy...Andrea Wilkinson

## 2016-08-07 ENCOUNTER — Other Ambulatory Visit: Payer: Self-pay | Admitting: Internal Medicine

## 2016-08-11 ENCOUNTER — Encounter (HOSPITAL_COMMUNITY): Payer: Self-pay | Admitting: Internal Medicine

## 2016-08-11 NOTE — Progress Notes (Signed)
Mailed patient letter with information about Cardiac Rehab program. MW °

## 2016-08-29 ENCOUNTER — Ambulatory Visit (INDEPENDENT_AMBULATORY_CARE_PROVIDER_SITE_OTHER): Payer: Medicare HMO | Admitting: Internal Medicine

## 2016-08-29 ENCOUNTER — Encounter: Payer: Self-pay | Admitting: Internal Medicine

## 2016-08-29 ENCOUNTER — Other Ambulatory Visit (INDEPENDENT_AMBULATORY_CARE_PROVIDER_SITE_OTHER): Payer: Medicare HMO

## 2016-08-29 VITALS — BP 180/90 | HR 68 | Temp 98.3°F | Resp 16 | Ht 60.0 in | Wt 145.0 lb

## 2016-08-29 DIAGNOSIS — E118 Type 2 diabetes mellitus with unspecified complications: Secondary | ICD-10-CM

## 2016-08-29 DIAGNOSIS — N183 Chronic kidney disease, stage 3 unspecified: Secondary | ICD-10-CM

## 2016-08-29 DIAGNOSIS — I2510 Atherosclerotic heart disease of native coronary artery without angina pectoris: Secondary | ICD-10-CM | POA: Diagnosis not present

## 2016-08-29 DIAGNOSIS — I10 Essential (primary) hypertension: Secondary | ICD-10-CM | POA: Diagnosis not present

## 2016-08-29 DIAGNOSIS — Z794 Long term (current) use of insulin: Secondary | ICD-10-CM

## 2016-08-29 DIAGNOSIS — M150 Primary generalized (osteo)arthritis: Secondary | ICD-10-CM | POA: Diagnosis not present

## 2016-08-29 DIAGNOSIS — E785 Hyperlipidemia, unspecified: Secondary | ICD-10-CM

## 2016-08-29 DIAGNOSIS — M479 Spondylosis, unspecified: Secondary | ICD-10-CM

## 2016-08-29 DIAGNOSIS — M159 Polyosteoarthritis, unspecified: Secondary | ICD-10-CM

## 2016-08-29 LAB — COMPREHENSIVE METABOLIC PANEL
ALBUMIN: 4.4 g/dL (ref 3.5–5.2)
ALT: 11 U/L (ref 0–35)
AST: 14 U/L (ref 0–37)
Alkaline Phosphatase: 56 U/L (ref 39–117)
BILIRUBIN TOTAL: 0.3 mg/dL (ref 0.2–1.2)
BUN: 30 mg/dL — AB (ref 6–23)
CALCIUM: 10.1 mg/dL (ref 8.4–10.5)
CO2: 27 mEq/L (ref 19–32)
CREATININE: 1.38 mg/dL — AB (ref 0.40–1.20)
Chloride: 100 mEq/L (ref 96–112)
GFR: 38.82 mL/min — ABNORMAL LOW (ref >60.00)
Glucose, Bld: 114 mg/dL — ABNORMAL HIGH (ref 70–99)
Potassium: 4.5 mEq/L (ref 3.5–5.1)
Sodium: 140 mEq/L (ref 135–145)
TOTAL PROTEIN: 7.4 g/dL (ref 6.0–8.3)

## 2016-08-29 LAB — MICROALBUMIN / CREATININE URINE RATIO
Creatinine,U: 35.1 mg/dL
MICROALB/CREAT RATIO: 5.4 mg/g (ref 0.0–30.0)
Microalb, Ur: 1.9 mg/dL (ref 0.0–1.9)

## 2016-08-29 LAB — LIPID PANEL
CHOLESTEROL: 189 mg/dL (ref 0–200)
HDL: 60.6 mg/dL (ref >39.00)
LDL CALC: 92 mg/dL (ref 0–99)
NonHDL: 127.97
TRIGLYCERIDES: 178 mg/dL — AB (ref 0.0–149.0)
Total CHOL/HDL Ratio: 3
VLDL: 35.6 mg/dL (ref 0.0–40.0)

## 2016-08-29 LAB — URINALYSIS, ROUTINE W REFLEX MICROSCOPIC
BILIRUBIN URINE: NEGATIVE
Hgb urine dipstick: NEGATIVE
Ketones, ur: NEGATIVE
Nitrite: NEGATIVE
PH: 5 (ref 5.0–8.0)
RBC / HPF: NONE SEEN (ref 0–?)
Specific Gravity, Urine: 1.01 (ref 1.000–1.030)
TOTAL PROTEIN, URINE-UPE24: NEGATIVE
Urine Glucose: NEGATIVE
Urobilinogen, UA: 0.2 (ref 0.0–1.0)

## 2016-08-29 LAB — HEMOGLOBIN A1C: HEMOGLOBIN A1C: 6.9 % — AB (ref 4.6–6.5)

## 2016-08-29 LAB — TSH: TSH: 1.42 u[IU]/mL (ref 0.35–4.50)

## 2016-08-29 MED ORDER — METOPROLOL TARTRATE 25 MG PO TABS: 37.5000 mg | ORAL_TABLET | Freq: Two times a day (BID) | ORAL | 1 refills | Status: DC

## 2016-08-29 MED ORDER — TRAMADOL HCL 50 MG PO TABS: 50.0000 mg | ORAL_TABLET | Freq: Four times a day (QID) | ORAL | 1 refills | Status: DC | PRN

## 2016-08-29 MED ORDER — ATORVASTATIN CALCIUM 40 MG PO TABS: 40.0000 mg | ORAL_TABLET | Freq: Every day | ORAL | 3 refills | Status: DC

## 2016-08-29 NOTE — Patient Instructions (Signed)

## 2016-08-29 NOTE — Progress Notes (Signed)
Pre visit review using our clinic review tool, if applicable. No additional management support is needed unless otherwise documented below in the visit note. 

## 2016-08-29 NOTE — Progress Notes (Signed)
Subjective:  Patient ID: Andrea Wilkinson, female    DOB: 06/12/1932  Age: 83 y.o. MRN: [id number]  CC: Hypertension; Hyperlipidemia; and Diabetes   HPI Andrea Wilkinson presents for f/up - She tells me that she has not been taking her beta blocker for several weeks because her cardiologist failed to refill it. Her blood pressure is not well controlled but fortunately, she has not had any unusual symptoms. She does complain of mild weakness that she has had since she had her coronary artery bypass grafting several months ago. She has had no recent episodes of headache/blurred vision/chest pain/shortness of breath/palpitations/edema/fatigue. She has also been out of the atorvastatin and needs a refill. Her joint pain is well controlled with tramadol.  Outpatient Medications Prior to Visit  Medication Sig Dispense Refill  . aspirin EC 81 MG EC tablet Take 1 tablet (81 mg total) by mouth daily.    . blood glucose meter kit and supplies KIT Use as directed to check blood sugar. DX: E11.9 1 each 0  . Blood Glucose Monitoring Suppl (ACCU-CHEK AVIVA PLUS) W/DEVICE KIT Use to check blood sugars daily 1 kit 0  . cyanocobalamin 2000 MCG tablet Take 1 tablet (2,000 mcg total) by mouth daily. 90 tablet 3  . enalapril (VASOTEC) 20 MG tablet Take 1 tablet (20 mg total) by mouth daily. 30 tablet 0  . furosemide (LASIX) 20 MG tablet TAKE 1 TABLET EVERY DAY 90 tablet 1  . glucose blood (ACCU-CHEK AVIVA PLUS) test strip Check sugar twice per day DX: E11.09 200 each 3  . Lancets (ACCU-CHEK MULTICLIX) lancets Check sugar twice per day DX: E11.09 200 each 3  . metFORMIN (GLUCOPHAGE) 1000 MG tablet TAKE 1 TABLET TWICE DAILY WITH MEALS 180 tablet 3  . Multiple Vitamins-Minerals (PRESERVISION AREDS 2 PO) Take 1 capsule by mouth 2 (two) times daily.    Marland Kitchen omeprazole (PRILOSEC) 20 MG capsule Take 1 capsule (20 mg total) by mouth daily. 90 capsule 3  . oxybutynin (DITROPAN-XL) 5 MG 24 hr tablet Take 1 tablet (5 mg total) by  mouth daily. (Patient taking differently: Take 5 mg by mouth daily as needed (for frequency). ) 90 tablet 3  . atorvastatin (LIPITOR) 40 MG tablet Take 1 tablet (40 mg total) by mouth daily at 6 PM. 30 tablet 1  . meloxicam (MOBIC) 15 MG tablet TAKE 1 TABLET EVERY DAY 90 tablet 1  . metoprolol tartrate (LOPRESSOR) 25 MG tablet Take 1.5 tablets (37.5 mg total) by mouth 2 (two) times daily. 270 tablet 1  . traMADol (ULTRAM) 50 MG tablet Take 1 tablet (50 mg total) by mouth every 6 (six) hours as needed for moderate pain. 30 tablet 0   No facility-administered medications prior to visit.     ROS Review of Systems  Constitutional: Negative for chills, diaphoresis, fatigue and unexpected weight change.  HENT: Negative.  Negative for trouble swallowing and voice change.   Eyes: Negative for visual disturbance.  Respiratory: Negative for cough, chest tightness and shortness of breath.   Cardiovascular: Negative for chest pain, palpitations and leg swelling.  Gastrointestinal: Negative for abdominal pain, constipation, diarrhea, nausea and vomiting.  Endocrine: Negative.  Negative for polyphagia and polyuria.  Genitourinary: Negative.  Negative for decreased urine volume, difficulty urinating, dysuria, hematuria and urgency.  Musculoskeletal: Positive for arthralgias. Negative for back pain and myalgias.  Skin: Negative.   Allergic/Immunologic: Negative.   Neurological: Negative.  Negative for dizziness, weakness and light-headedness.  Hematological: Negative.  Negative for  adenopathy. Does not bruise/bleed easily.  Psychiatric/Behavioral: Negative.     Objective:  BP (!) 180/90 (BP Location: Left Arm, Patient Position: Sitting, Cuff Size: Normal)   Pulse 68   Temp 98.3 F (36.8 C) (Oral)   Resp 16   Ht 5' (1.524 m)   Wt 145 lb (65.8 kg)   SpO2 97%   BMI 28.32 kg/m   BP Readings from Last 3 Encounters:  08/29/16 (!) 180/90  07/21/16 (!) 150/72  05/31/16 (!) 208/80    Wt Readings  from Last 3 Encounters:  08/29/16 145 lb (65.8 kg)  07/21/16 143 lb 12.8 oz (65.2 kg)  05/31/16 144 lb 12 oz (65.7 kg)    Physical Exam  Constitutional: She is oriented to person, place, and time. She appears well-developed and well-nourished. No distress.  HENT:  Mouth/Throat: Oropharynx is clear and moist. No oropharyngeal exudate.  Eyes: Conjunctivae are normal. Right eye exhibits no discharge. Left eye exhibits no discharge. No scleral icterus.  Neck: Normal range of motion. Neck supple. No JVD present. No tracheal deviation present. No thyromegaly present.  Cardiovascular: Normal rate and intact distal pulses.  Exam reveals no gallop and no friction rub.   No murmur heard. Pulmonary/Chest: Effort normal and breath sounds normal. No stridor. No respiratory distress. She has no rales. She exhibits no tenderness.  Abdominal: Soft. Bowel sounds are normal. She exhibits no distension and no mass. There is no tenderness. There is no rebound and no guarding.  Musculoskeletal: Normal range of motion. She exhibits no edema, tenderness or deformity.  Lymphadenopathy:    She has no cervical adenopathy.  Neurological: She is oriented to person, place, and time.  Skin: Skin is warm and dry. No rash noted. She is not diaphoretic. No erythema.  Vitals reviewed.   Lab Results  Component Value Date   WBC 9.1 05/31/2016   HGB 13.4 05/31/2016   HCT 40.7 05/31/2016   PLT 304.0 05/31/2016   GLUCOSE 114 (H) 08/29/2016   CHOL 189 08/29/2016   TRIG 178.0 (H) 08/29/2016   HDL 60.60 08/29/2016   LDLDIRECT 90.0 05/05/2015   LDLCALC 92 08/29/2016   ALT 11 08/29/2016   AST 14 08/29/2016   NA 140 08/29/2016   K 4.5 08/29/2016   CL 100 08/29/2016   CREATININE 1.38 (H) 08/29/2016   BUN 30 (H) 08/29/2016   CO2 27 08/29/2016   TSH 1.42 08/29/2016   INR 1.49 03/24/2016   HGBA1C 6.9 (H) 08/29/2016   MICROALBUR 1.9 08/29/2016    Dg Chest 2 View  Result Date: 04/27/2016 CLINICAL DATA:  History  CABG 03/24/2016. Fatigue, shortness of breath EXAM: CHEST  2 VIEW COMPARISON:  03/29/2016 FINDINGS: There is no focal parenchymal opacity. There is no pleural effusion or pneumothorax. The heart and mediastinal contours are unremarkable. There is evidence of prior CABG. The osseous structures are unremarkable. IMPRESSION: No active cardiopulmonary disease. Electronically Signed   By: Kathreen Devoid   On: 04/27/2016 14:06    Assessment & Plan:   Aneita was seen today for hypertension, hyperlipidemia and diabetes.  Diagnoses and all orders for this visit:  Essential hypertension- her blood pressure is not adequately well controlled, will restart the beta blocker. -     Comprehensive metabolic panel; Future -     Urinalysis, Routine w reflex microscopic; Future -     metoprolol tartrate (LOPRESSOR) 25 MG tablet; Take 1.5 tablets (37.5 mg total) by mouth 2 (two) times daily.  Type 2 diabetes mellitus with complication,  with long-term current use of insulin ([place])- her A1c is 6.9%, her blood sugars are adequately well controlled. -     Comprehensive metabolic panel; Future -     Hemoglobin A1c; Future -     Microalbumin / creatinine urine ratio; Future  CKD (chronic kidney disease) stage 3, GFR 30-59 ml/min- her renal function is stable, I've asked her to avoid nephrotoxic agents and will continue to seek better blood pressure and blood sugar control. -     Comprehensive metabolic panel; Future  Hyperlipidemia LDL goal <70- she has not achieved her LDL goal, will restart atorvastatin. -     Lipid panel; Future -     TSH; Future -     atorvastatin (LIPITOR) 40 MG tablet; Take 1 tablet (40 mg total) by mouth daily at 6 PM.  Coronary artery disease involving native coronary artery of native heart without angina pectoris -     metoprolol tartrate (LOPRESSOR) 25 MG tablet; Take 1.5 tablets (37.5 mg total) by mouth 2 (two) times daily. -     atorvastatin (LIPITOR) 40 MG tablet; Take 1 tablet (40 mg  total) by mouth daily at 6 PM.  Primary osteoarthritis involving multiple joints -     traMADol (ULTRAM) 50 MG tablet; Take 1 tablet (50 mg total) by mouth every 6 (six) hours as needed for moderate pain.  Osteoarthritis of spine, unspecified spinal osteoarthritis complication status, unspecified spinal region -     traMADol (ULTRAM) 50 MG tablet; Take 1 tablet (50 mg total) by mouth every 6 (six) hours as needed for moderate pain.   I have discontinued Ms. Whetsell's meloxicam. I am also having her maintain her ACCU-CHEK AVIVA PLUS, oxybutynin, omeprazole, aspirin, blood glucose meter kit and supplies, accu-chek multiclix, glucose blood, metFORMIN, Multiple Vitamins-Minerals (PRESERVISION AREDS 2 PO), furosemide, cyanocobalamin, enalapril, traMADol, metoprolol tartrate, and atorvastatin.  Meds ordered this encounter  Medications  . traMADol (ULTRAM) 50 MG tablet    Sig: Take 1 tablet (50 mg total) by mouth every 6 (six) hours as needed for moderate pain.    Dispense:  270 tablet    Refill:  1  . metoprolol tartrate (LOPRESSOR) 25 MG tablet    Sig: Take 1.5 tablets (37.5 mg total) by mouth 2 (two) times daily.    Dispense:  270 tablet    Refill:  1  . atorvastatin (LIPITOR) 40 MG tablet    Sig: Take 1 tablet (40 mg total) by mouth daily at 6 PM.    Dispense:  90 tablet    Refill:  3     Follow-up: Return in about 4 months (around 12/29/2016).  Scarlette Calico, MD

## 2016-10-04 ENCOUNTER — Other Ambulatory Visit: Payer: Self-pay | Admitting: Internal Medicine

## 2016-10-04 DIAGNOSIS — K219 Gastro-esophageal reflux disease without esophagitis: Secondary | ICD-10-CM

## 2016-10-19 ENCOUNTER — Ambulatory Visit (INDEPENDENT_AMBULATORY_CARE_PROVIDER_SITE_OTHER): Payer: Medicare HMO | Admitting: Internal Medicine

## 2016-10-19 ENCOUNTER — Encounter: Payer: Self-pay | Admitting: Internal Medicine

## 2016-10-19 VITALS — BP 160/78 | HR 66 | Temp 98.2°F | Resp 16 | Ht 60.0 in | Wt 150.1 lb

## 2016-10-19 DIAGNOSIS — K582 Mixed irritable bowel syndrome: Secondary | ICD-10-CM | POA: Diagnosis not present

## 2016-10-19 DIAGNOSIS — D518 Other vitamin B12 deficiency anemias: Secondary | ICD-10-CM | POA: Diagnosis not present

## 2016-10-19 DIAGNOSIS — J029 Acute pharyngitis, unspecified: Secondary | ICD-10-CM

## 2016-10-19 DIAGNOSIS — D508 Other iron deficiency anemias: Secondary | ICD-10-CM

## 2016-10-19 MED ORDER — PB-HYOSCY-ATROPINE-SCOPOLAMINE 16.2 MG PO TABS: 1.0000 | ORAL_TABLET | Freq: Three times a day (TID) | ORAL | 3 refills | Status: DC | PRN

## 2016-10-19 MED ORDER — CYANOCOBALAMIN 1000 MCG/ML IJ SOLN
1000.0000 ug | Freq: Once | INTRAMUSCULAR | Status: AC
  Administered 2016-10-19: 1000 ug via INTRAMUSCULAR

## 2016-10-19 NOTE — Progress Notes (Signed)
Subjective:  Patient ID: Andrea Wilkinson, female    DOB: 06-03-1932  Age: 83 y.o. MRN: [id number]  CC: Sore Throat; Anemia; and Hypertension   HPI Honey F Mancia presents for f/up - She complains of a one-day history of scratchy throat. She also wants a refill of Donnatal. She has a long-standing history of IBS and has episodes of diarrhea that is intermittently crampy but she also occasionally has constipation. She thinks the B12 supplement upsets her stomach and she wants to stop taking it and switch to an injectable form.  Outpatient Medications Prior to Visit  Medication Sig Dispense Refill  . aspirin EC 81 MG EC tablet Take 1 tablet (81 mg total) by mouth daily.    Marland Kitchen atorvastatin (LIPITOR) 40 MG tablet Take 1 tablet (40 mg total) by mouth daily at 6 PM. 90 tablet 3  . blood glucose meter kit and supplies KIT Use as directed to check blood sugar. DX: E11.9 1 each 0  . Blood Glucose Monitoring Suppl (ACCU-CHEK AVIVA PLUS) W/DEVICE KIT Use to check blood sugars daily 1 kit 0  . enalapril (VASOTEC) 20 MG tablet Take 1 tablet (20 mg total) by mouth daily. 30 tablet 0  . furosemide (LASIX) 20 MG tablet TAKE 1 TABLET EVERY DAY 90 tablet 1  . glucose blood (ACCU-CHEK AVIVA PLUS) test strip Check sugar twice per day DX: E11.09 200 each 3  . Lancets (ACCU-CHEK MULTICLIX) lancets Check sugar twice per day DX: E11.09 200 each 3  . metFORMIN (GLUCOPHAGE) 1000 MG tablet TAKE 1 TABLET TWICE DAILY WITH MEALS 180 tablet 3  . metoprolol tartrate (LOPRESSOR) 25 MG tablet Take 1.5 tablets (37.5 mg total) by mouth 2 (two) times daily. 270 tablet 1  . Multiple Vitamins-Minerals (PRESERVISION AREDS 2 PO) Take 1 capsule by mouth 2 (two) times daily.    Marland Kitchen omeprazole (PRILOSEC) 20 MG capsule TAKE 1 CAPSULE EVERY DAY 90 capsule 3  . oxybutynin (DITROPAN-XL) 5 MG 24 hr tablet Take 1 tablet (5 mg total) by mouth daily. (Patient taking differently: Take 5 mg by mouth daily as needed (for frequency). ) 90 tablet 3  .  traMADol (ULTRAM) 50 MG tablet Take 1 tablet (50 mg total) by mouth every 6 (six) hours as needed for moderate pain. 270 tablet 1  . cyanocobalamin 2000 MCG tablet Take 1 tablet (2,000 mcg total) by mouth daily. (Patient not taking: Reported on 10/19/2016) 90 tablet 3   No facility-administered medications prior to visit.     ROS Review of Systems  Constitutional: Negative for appetite change, chills, diaphoresis, fatigue, fever and unexpected weight change.  HENT: Positive for sore throat. Negative for dental problem, facial swelling and trouble swallowing.   Eyes: Negative.   Respiratory: Negative.  Negative for cough, chest tightness, shortness of breath and wheezing.   Cardiovascular: Negative for chest pain, palpitations and leg swelling.  Gastrointestinal: Negative for abdominal pain, constipation, diarrhea, nausea and vomiting.  Endocrine: Negative.   Genitourinary: Negative.   Musculoskeletal: Negative.  Negative for back pain, myalgias and neck pain.  Skin: Negative.  Negative for color change and rash.  Allergic/Immunologic: Negative.   Neurological: Negative.  Negative for dizziness, weakness, light-headedness, numbness and headaches.  Hematological: Negative for adenopathy. Does not bruise/bleed easily.  Psychiatric/Behavioral: Negative.     Objective:  BP (!) 160/78 (BP Location: Left Arm, Patient Position: Sitting, Cuff Size: Normal)   Pulse 66   Temp 98.2 F (36.8 C) (Oral)   Resp 16  Ht 5' (1.524 m)   Wt 150 lb 1.3 oz (68.1 kg)   SpO2 99%   BMI 29.31 kg/m   BP Readings from Last 3 Encounters:  10/19/16 (!) 160/78  08/29/16 (!) 180/90  07/21/16 (!) 150/72    Wt Readings from Last 3 Encounters:  10/19/16 150 lb 1.3 oz (68.1 kg)  08/29/16 145 lb (65.8 kg)  07/21/16 143 lb 12.8 oz (65.2 kg)    Physical Exam  Constitutional: She is oriented to person, place, and time. No distress.  HENT:  Mouth/Throat: Oropharynx is clear and moist and mucous membranes are  normal. Mucous membranes are not pale, not dry and not cyanotic. No oral lesions. No trismus in the jaw. No uvula swelling. No oropharyngeal exudate, posterior oropharyngeal edema, posterior oropharyngeal erythema or tonsillar abscesses.  Eyes: Conjunctivae are normal. Right eye exhibits no discharge. Left eye exhibits no discharge. No scleral icterus.  Neck: Normal range of motion. Neck supple. No JVD present. No tracheal deviation present. No thyromegaly present.  Cardiovascular: Normal rate, regular rhythm, normal heart sounds and intact distal pulses.  Exam reveals no gallop and no friction rub.   No murmur heard. Pulmonary/Chest: Effort normal and breath sounds normal. No stridor. No respiratory distress. She has no wheezes. She has no rales. She exhibits no tenderness.  Abdominal: Soft. Bowel sounds are normal. She exhibits no distension and no mass. There is no tenderness. There is no rebound and no guarding.  Musculoskeletal: Normal range of motion. She exhibits no edema or tenderness.  Lymphadenopathy:    She has no cervical adenopathy.  Neurological: She is oriented to person, place, and time.  Skin: Skin is warm and dry. No rash noted. She is not diaphoretic. No erythema. No pallor.  Vitals reviewed.   Lab Results  Component Value Date   WBC 9.1 05/31/2016   HGB 13.4 05/31/2016   HCT 40.7 05/31/2016   PLT 304.0 05/31/2016   GLUCOSE 114 (H) 08/29/2016   CHOL 189 08/29/2016   TRIG 178.0 (H) 08/29/2016   HDL 60.60 08/29/2016   LDLDIRECT 90.0 05/05/2015   LDLCALC 92 08/29/2016   ALT 11 08/29/2016   AST 14 08/29/2016   NA 140 08/29/2016   K 4.5 08/29/2016   CL 100 08/29/2016   CREATININE 1.38 (H) 08/29/2016   BUN 30 (H) 08/29/2016   CO2 27 08/29/2016   TSH 1.42 08/29/2016   INR 1.49 03/24/2016   HGBA1C 6.9 (H) 08/29/2016   MICROALBUR 1.9 08/29/2016    Dg Chest 2 View  Result Date: 04/27/2016 CLINICAL DATA:  History CABG 03/24/2016. Fatigue, shortness of breath EXAM:  CHEST  2 VIEW COMPARISON:  03/29/2016 FINDINGS: There is no focal parenchymal opacity. There is no pleural effusion or pneumothorax. The heart and mediastinal contours are unremarkable. There is evidence of prior CABG. The osseous structures are unremarkable. IMPRESSION: No active cardiopulmonary disease. Electronically Signed   By: Kathreen Devoid   On: 04/27/2016 14:06    Assessment & Plan:   Kanija was seen today for sore throat, anemia and hypertension.  Diagnoses and all orders for this visit:  Iron deficiency anemia secondary to inadequate dietary iron intake- she is doing well on iron replacement therapy -     cyanocobalamin ((VITAMIN B-12)) injection 1,000 mcg; Inject 1 mL (1,000 mcg total) into the muscle once.  Irritable bowel syndrome with both constipation and diarrhea -     belladona alk-PHENObarbital (DONNATAL) 16.2 MG tablet; Take 1 tablet by mouth every 8 (eight) hours  as needed.  Dietary vitamin B12 deficiency anemia- will switch to parenteral B12 replacement therapy  Acute pharyngitis, unspecified etiology- no treatment is indicated for this   I have discontinued Ms. Dyches's cyanocobalamin. I am also having her start on belladona alk-PHENObarbital. Additionally, I am having her maintain her ACCU-CHEK AVIVA PLUS, oxybutynin, aspirin, blood glucose meter kit and supplies, accu-chek multiclix, glucose blood, metFORMIN, Multiple Vitamins-Minerals (PRESERVISION AREDS 2 PO), furosemide, enalapril, traMADol, metoprolol tartrate, atorvastatin, and omeprazole. We administered cyanocobalamin.  Meds ordered this encounter  Medications  . belladona alk-PHENObarbital (DONNATAL) 16.2 MG tablet    Sig: Take 1 tablet by mouth every 8 (eight) hours as needed.    Dispense:  90 tablet    Refill:  3  . cyanocobalamin ((VITAMIN B-12)) injection 1,000 mcg     Follow-up: Return if symptoms worsen or fail to improve.  Scarlette Calico, MD

## 2016-10-19 NOTE — Patient Instructions (Signed)
Sore Throat A sore throat is pain, burning, irritation, or scratchiness in the throat. When you have a sore throat, you may feel pain or tenderness in your throat when you swallow or talk. Many things can cause a sore throat, including:  An infection.  Seasonal allergies.  Dryness in the air.  Irritants, such as smoke or pollution.  Gastroesophageal reflux disease (GERD).  A tumor. A sore throat is often the first sign of another sickness. It may happen with other symptoms, such as coughing, sneezing, fever, and swollen neck glands. Most sore throats go away without medical treatment. Follow these instructions at home:  Take over-the-counter medicines only as told by your health care provider.  Drink enough fluids to keep your urine clear or pale yellow.  Rest as needed.  To help with pain, try:  Sipping warm liquids, such as broth, herbal tea, or warm water.  Eating or drinking cold or frozen liquids, such as frozen ice pops.  Gargling with a salt-water mixture 3-4 times a day or as needed. To make a salt-water mixture, completely dissolve -1 tsp of salt in 1 cup of warm water.  Sucking on hard candy or throat lozenges.  Putting a cool-mist humidifier in your bedroom at night to moisten the air.  Sitting in the bathroom with the door closed for 5-10 minutes while you run hot water in the shower.  Do not use any tobacco products, such as cigarettes, chewing tobacco, and e-cigarettes. If you need help quitting, ask your health care provider. Contact a health care provider if:  You have a fever for more than 2-3 days.  You have symptoms that last (are persistent) for more than 2-3 days.  Your throat does not get better within 7 days.  You have a fever and your symptoms suddenly get worse. Get help right away if:  You have difficulty breathing.  You cannot swallow fluids, soft foods, or your saliva.  You have increased swelling in your throat or neck.  You have  persistent nausea and vomiting. This information is not intended to replace advice given to you by your health care provider. Make sure you discuss any questions you have with your health care provider. Document Released: 07/13/2004 Document Revised: 01/30/2016 Document Reviewed: 03/26/2015 Elsevier Interactive Patient Education  2017 [employer]  

## 2016-10-19 NOTE — Progress Notes (Signed)
Pre visit review using our clinic review tool, if applicable. No additional management support is needed unless otherwise documented below in the visit note. 

## 2016-10-20 ENCOUNTER — Telehealth: Payer: Self-pay | Admitting: Internal Medicine

## 2016-10-20 NOTE — Telephone Encounter (Signed)
Pt went to pick up belladona alk-PHENObarbital (DONNATAL) 16.2 MG tablet    Is it not covered by [employer] it is $500.  Would like something else sent in.  [facility]

## 2016-10-23 ENCOUNTER — Other Ambulatory Visit: Payer: Self-pay | Admitting: Internal Medicine

## 2016-10-23 DIAGNOSIS — K582 Mixed irritable bowel syndrome: Secondary | ICD-10-CM

## 2016-10-23 MED ORDER — HYOSCYAMINE SULFATE ER 0.375 MG PO TB12: 0.3750 mg | ORAL_TABLET | Freq: Two times a day (BID) | ORAL | 0 refills | Status: DC

## 2016-10-25 NOTE — Telephone Encounter (Signed)
Left detailed message that new erx has been sent.

## 2016-11-18 IMAGING — CR DG CHEST 2V
2 series · 2 of 2 positions shown · non-contrast
Comparison: 03/29/2016

CLINICAL DATA: History CABG 03/24/2016. Fatigue, shortness of
breath

EXAM:
CHEST  2 VIEW

[w chest pa]
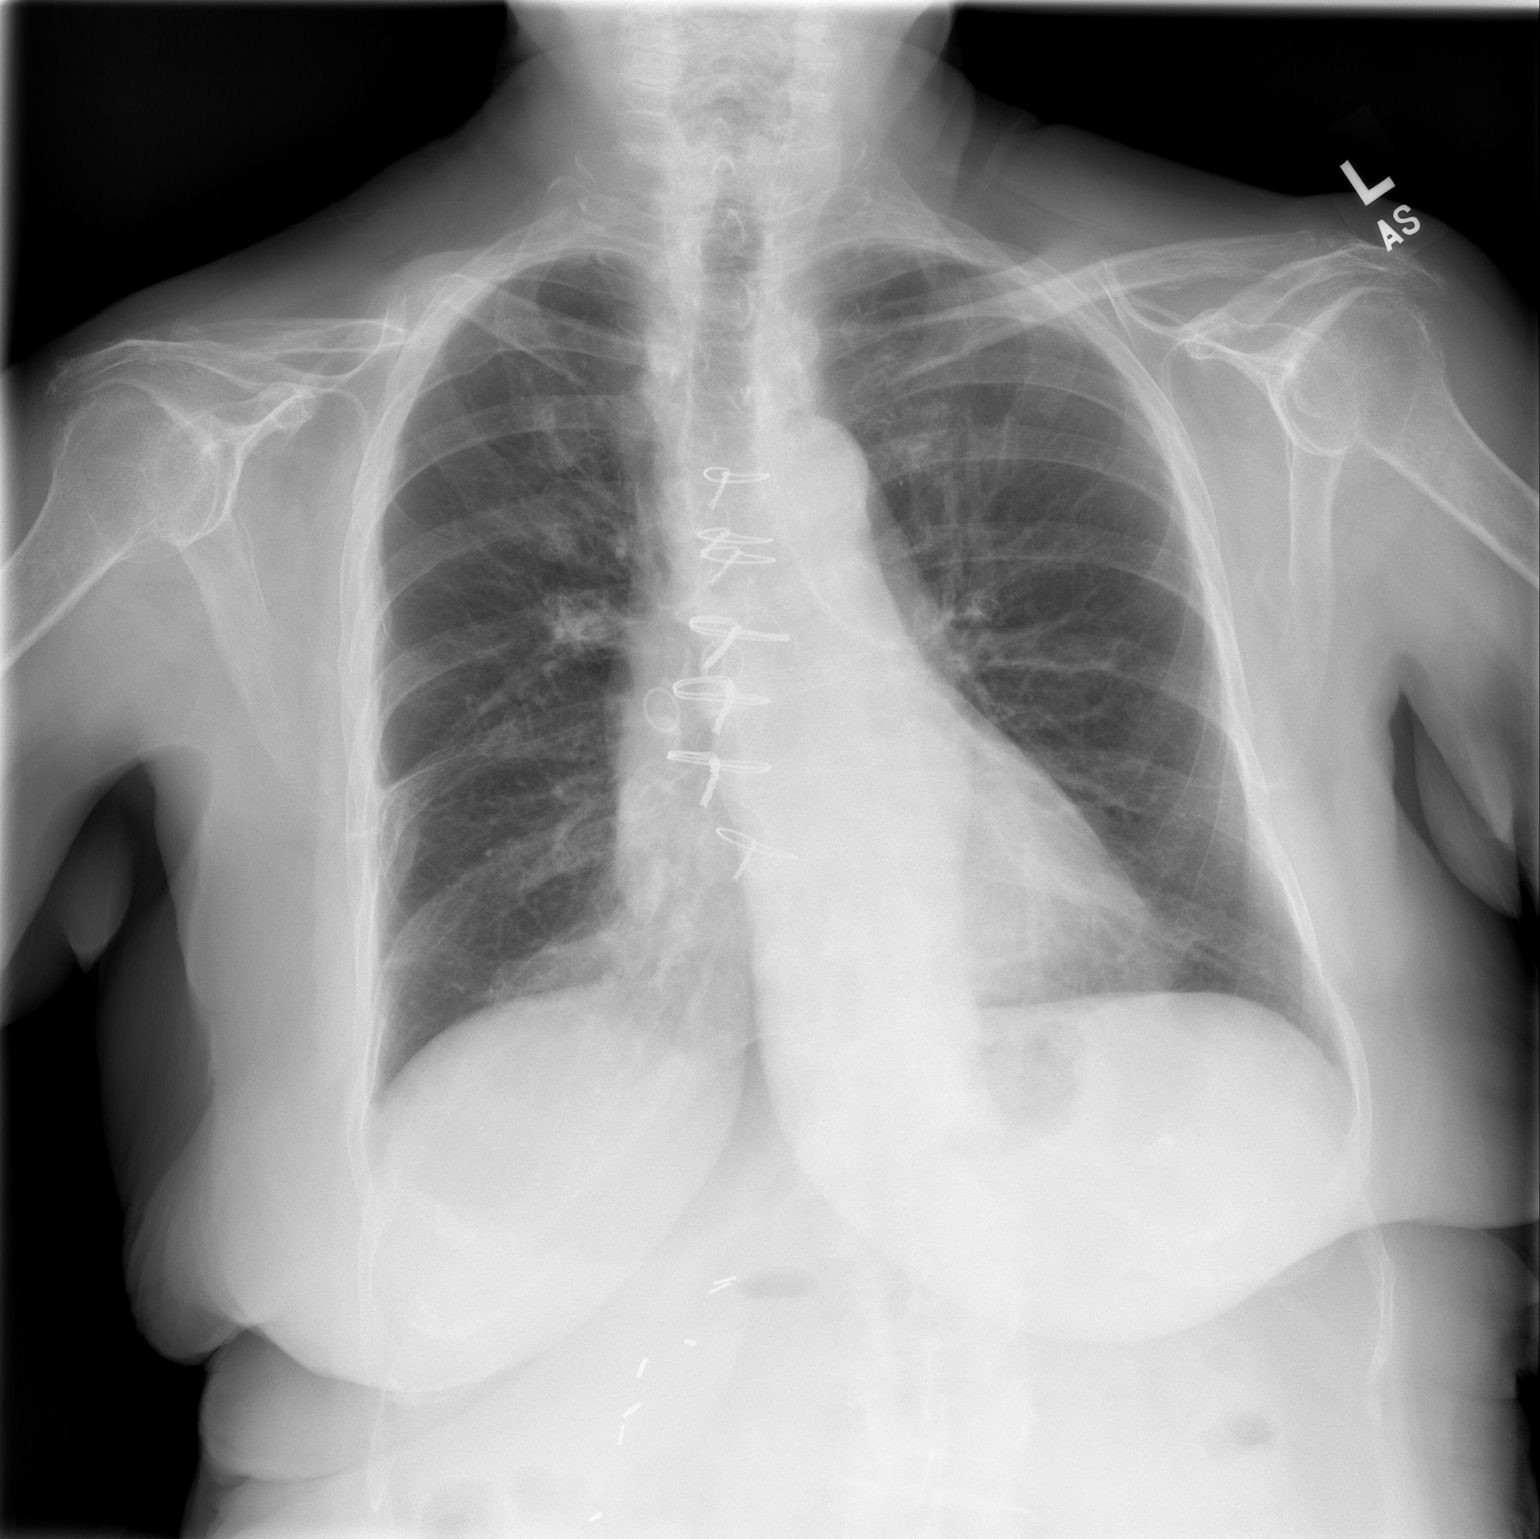

[w chest lat]
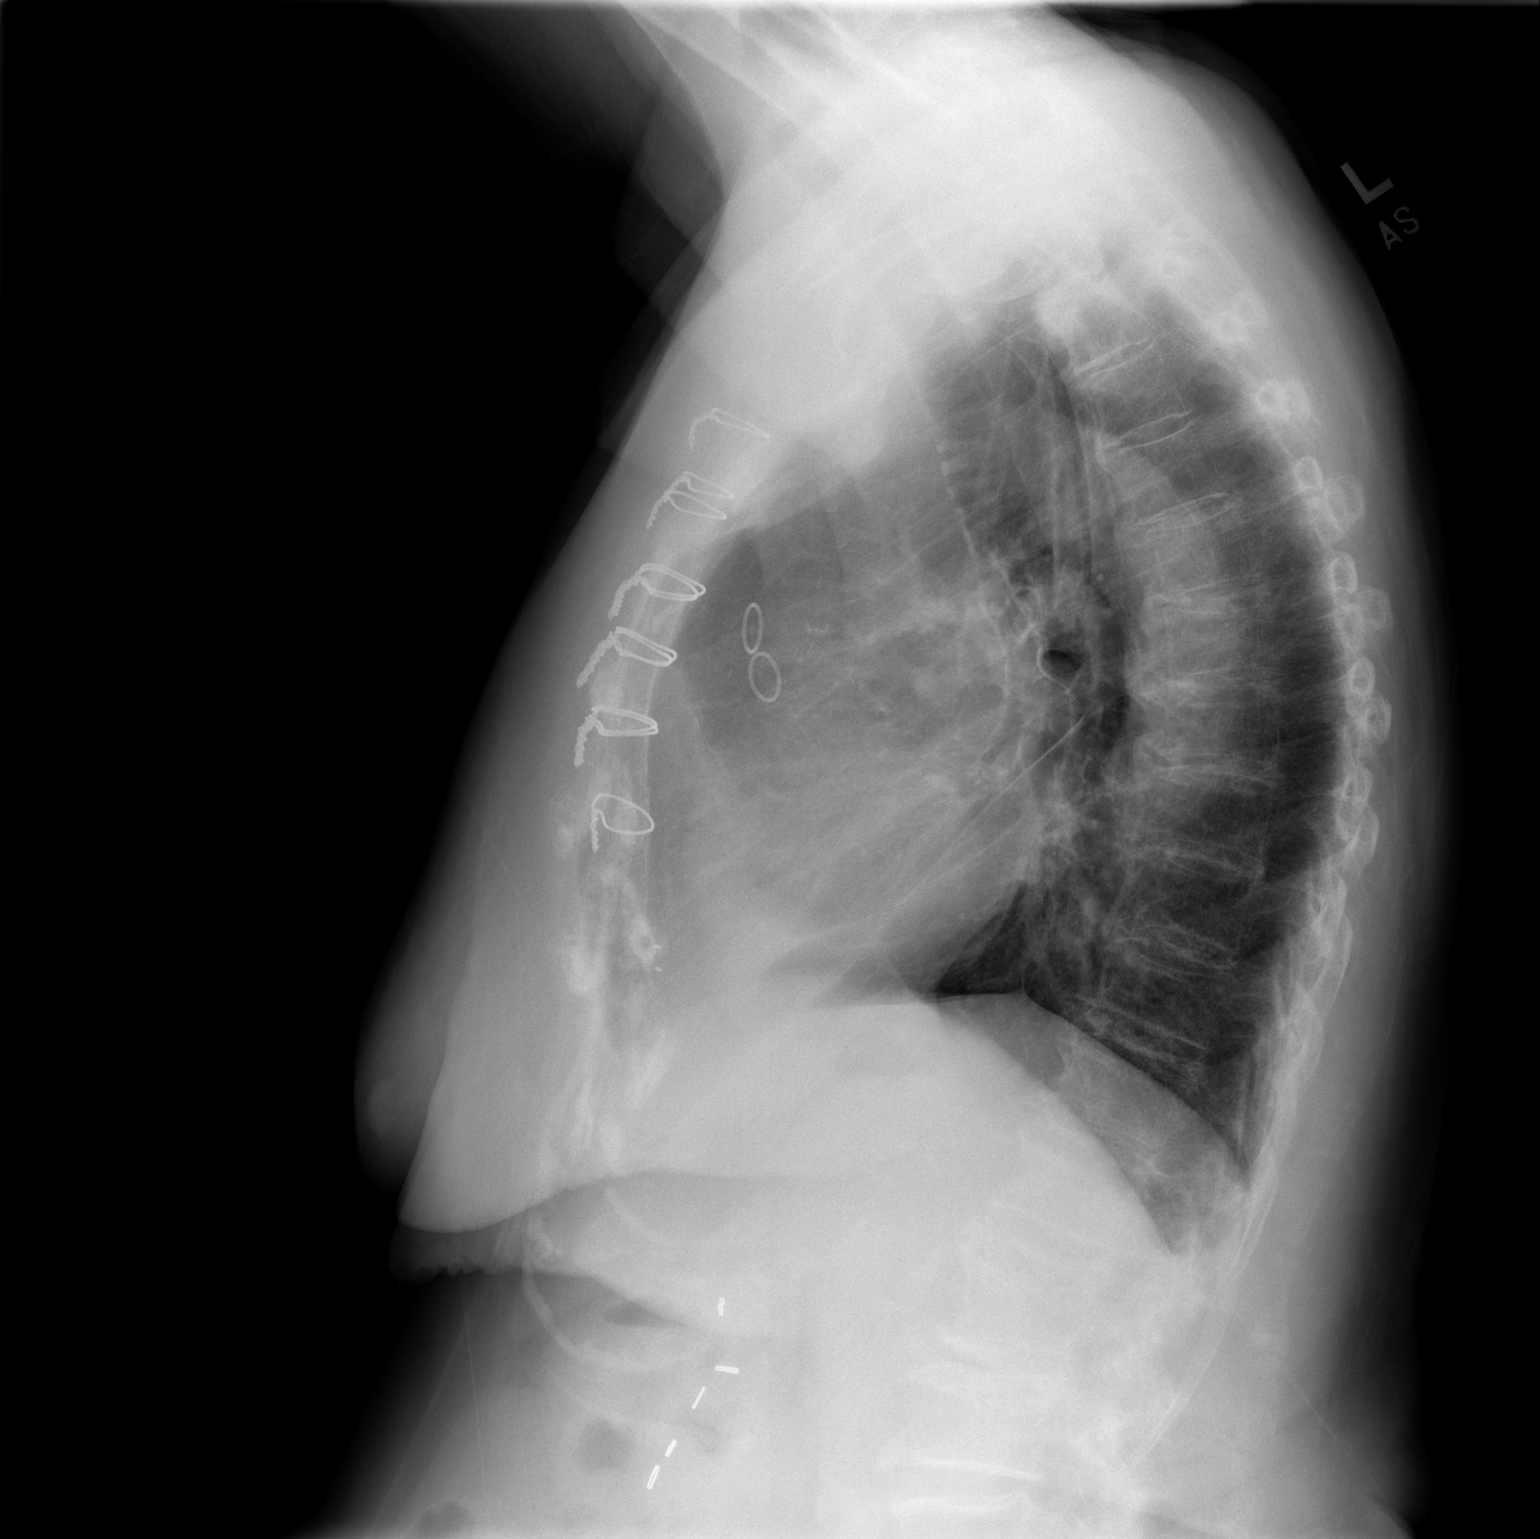

[2 of 2 positions shown; findings below may reference images not displayed]

FINDINGS: There is no focal parenchymal opacity. There is no pleural effusion
or pneumothorax. The heart and mediastinal contours are
unremarkable. There is evidence of prior CABG.

The osseous structures are unremarkable.
IMPRESSION: No active cardiopulmonary disease.

## 2016-11-23 ENCOUNTER — Ambulatory Visit (INDEPENDENT_AMBULATORY_CARE_PROVIDER_SITE_OTHER): Payer: Medicare HMO | Admitting: Interventional Cardiology

## 2016-11-23 ENCOUNTER — Encounter (INDEPENDENT_AMBULATORY_CARE_PROVIDER_SITE_OTHER): Payer: Self-pay

## 2016-11-23 ENCOUNTER — Encounter: Payer: Self-pay | Admitting: Interventional Cardiology

## 2016-11-23 VITALS — BP 160/78 | HR 69 | Ht 60.0 in | Wt 146.4 lb

## 2016-11-23 DIAGNOSIS — I25119 Atherosclerotic heart disease of native coronary artery with unspecified angina pectoris: Secondary | ICD-10-CM

## 2016-11-23 DIAGNOSIS — E785 Hyperlipidemia, unspecified: Secondary | ICD-10-CM

## 2016-11-23 DIAGNOSIS — I10 Essential (primary) hypertension: Secondary | ICD-10-CM | POA: Diagnosis not present

## 2016-11-23 DIAGNOSIS — Z794 Long term (current) use of insulin: Secondary | ICD-10-CM

## 2016-11-23 DIAGNOSIS — E118 Type 2 diabetes mellitus with unspecified complications: Secondary | ICD-10-CM | POA: Diagnosis not present

## 2016-11-23 MED ORDER — AMLODIPINE BESYLATE 5 MG PO TABS: 5.0000 mg | ORAL_TABLET | Freq: Every day | ORAL | 3 refills | Status: DC

## 2016-11-23 NOTE — Progress Notes (Signed)
Cardiology Office Note   Date:  11/23/2016   ID:  Andrea Wilkinson, DOB Aug 24, 1931, MRN [id number]  PCP:  Janith Lima, MD    No chief complaint on file. CAD   Wt Readings from Last 3 Encounters:  11/23/16 146 lb 6.4 oz (66.4 kg)  10/19/16 150 lb 1.3 oz (68.1 kg)  08/29/16 145 lb (65.8 kg)       History of Present Illness: Andrea Wilkinson is a 83 y.o. female  with a history of hypertension, diabetes, chronic kidney disease and  CAD.    She initially presented in 03/22/2016 with complaint of chest discomfort. She ruled in for non-STEMI. Subsequent cardiac catheterization revealed critical 3 vessel obstructive CAD. Echo showed normal LVEF at 50-55%. She ultimately required coronary artery bypass grafting x 4 utilizing LIMA to LAD, Sequential SVG to OM 1 and OM2, and SVG to PDA. She also underwent harvest of greater saphenous from right leg and left thigh. This was performed on 03/24/16, per Dr. Servando Snare. Postoperative course was complicated by atrial fibrillation however she was treated with amiodarone and converted to sinus rhythm prior to discharge.  Amio was stopped.  Denies : Chest pain. Dizziness. Nitroglycerin use. Orthopnea. Palpitations. Paroxysmal nocturnal dyspnea. Shortness of breath. Syncope.    She feels weak.  She does walk when the weather cooperates.    She has had some leg edema since the bypass.  Both legs accessed for veins as noted above.  Improved with leg elevation.   SHe checks BP at home.  She does not remember the exact numbers but thinks they are in the 408X systolic.  Tolerating aspirin.  In the past, it irritated her stomach but she has not had any problems recently.      Past Medical History:  Diagnosis Date  . Cancer ([place])    basal cell cancer on face  . Cervical spine arthritis ([place])   . Depression   . Diabetic retinopathy ([place])   . DJD (degenerative joint disease)   . DM (diabetes mellitus), type 2 ([place])    type 2  . Esophageal  stricture   . GERD (gastroesophageal reflux disease)   . History of hiatal hernia   . Hypertension     Past Surgical History:  Procedure Laterality Date  . ABDOMINAL HYSTERECTOMY    . AIR/FLUID EXCHANGE Left 08/10/2015   Procedure: AIR/FLUID EXCHANGE, left eye;  Surgeon: Hayden Pedro, MD;  Location: [facility];  Service: Ophthalmology;  Laterality: Left;  . benign tumor removed from left hip    . CARDIAC CATHETERIZATION N/A 03/22/2016   Procedure: Left Heart Cath and Coronary Angiography;  Surgeon: Jettie Booze, MD;  Location: [facility];  Service: Cardiovascular;  Laterality: N/A;  . CATARACT EXTRACTION, BILATERAL  July 2013   Left done on 12/18/11 and right done on 01/15/2012  . CHOLECYSTECTOMY    . CORONARY ARTERY BYPASS GRAFT N/A 03/24/2016   Procedure: CORONARY ARTERY BYPASS GRAFTING (CABG) x 4 utilizing left internal mammary artery and left greater saphenous vein, bilateral saphenous veins harvested endoscopicaly,  SVG to PD, LIMA  to LAD, SEQ SVG to OM1 and OM2;  Surgeon: Grace Isaac, MD;  Location: [facility];  Service: Open Heart Surgery;  Laterality: N/A;  . DILATION AND CURETTAGE OF UTERUS    . LASER PHOTO ABLATION Left 08/10/2015   Procedure: LASER PHOTO ABLATION, left eye;  Surgeon: Hayden Pedro, MD;  Location: [facility];  Service: Ophthalmology;  Laterality:  Left;  . left breast lumpectomy    . MEMBRANE PEEL Left 08/10/2015   Procedure: MEMBRANE PEEL, left eye;  Surgeon: Hayden Pedro, MD;  Location: [facility];  Service: Ophthalmology;  Laterality: Left;  . PARS PLANA VITRECTOMY Left 08/10/2015   Procedure: PARS PLANA VITRECTOMY WITH 25 GAUGE, headscope laser;  Surgeon: Hayden Pedro, MD;  Location: [facility];  Service: Ophthalmology;  Laterality: Left;  . TEE WITHOUT CARDIOVERSION N/A 03/24/2016   Procedure: TRANSESOPHAGEAL ECHOCARDIOGRAM (TEE);  Surgeon: Grace Isaac, MD;  Location: [facility];  Service: Open Heart Surgery;  Laterality: N/A;  . TOTAL KNEE ARTHROPLASTY      left Jan '12; right May '12 - by Dr. Alvan Dame  . TUBAL LIGATION       Current Outpatient Prescriptions  Medication Sig Dispense Refill  . aspirin EC 81 MG EC tablet Take 1 tablet (81 mg total) by mouth daily.    Marland Kitchen atorvastatin (LIPITOR) 40 MG tablet Take 1 tablet (40 mg total) by mouth daily at 6 PM. 90 tablet 3  . blood glucose meter kit and supplies KIT Use as directed to check blood sugar. DX: E11.9 1 each 0  . Blood Glucose Monitoring Suppl (ACCU-CHEK AVIVA PLUS) W/DEVICE KIT Use to check blood sugars daily 1 kit 0  . enalapril (VASOTEC) 20 MG tablet Take 1 tablet (20 mg total) by mouth daily. 30 tablet 0  . furosemide (LASIX) 20 MG tablet TAKE 1 TABLET EVERY DAY 90 tablet 1  . glucose blood (ACCU-CHEK AVIVA PLUS) test strip Check sugar twice per day DX: E11.09 200 each 3  . hyoscyamine (LEVBID) 0.375 MG 12 hr tablet Take 1 tablet (0.375 mg total) by mouth 2 (two) times daily. 180 tablet 0  . Lancets (ACCU-CHEK MULTICLIX) lancets Check sugar twice per day DX: E11.09 200 each 3  . metFORMIN (GLUCOPHAGE) 1000 MG tablet TAKE 1 TABLET TWICE DAILY WITH MEALS 180 tablet 3  . metoprolol tartrate (LOPRESSOR) 25 MG tablet Take 1.5 tablets (37.5 mg total) by mouth 2 (two) times daily. 270 tablet 1  . Multiple Vitamins-Minerals (PRESERVISION AREDS 2 PO) Take 1 capsule by mouth 2 (two) times daily.    Marland Kitchen oxybutynin (DITROPAN-XL) 5 MG 24 hr tablet Take 1 tablet (5 mg total) by mouth daily. (Patient taking differently: Take 5 mg by mouth daily as needed (for frequency). ) 90 tablet 3  . traMADol (ULTRAM) 50 MG tablet Take 1 tablet (50 mg total) by mouth every 6 (six) hours as needed for moderate pain. 270 tablet 1  . omeprazole (PRILOSEC) 20 MG capsule TAKE 1 CAPSULE EVERY DAY (Patient not taking: Reported on 11/23/2016) 90 capsule 3   No current facility-administered medications for this visit.     Allergies:   Aspirin and Codeine    Social History:  The patient  reports that she has never smoked. She  has never used smokeless tobacco. She reports that she does not drink alcohol or use drugs.   Family History:  The patient's family history includes Diabetes in her father.    ROS:  Please see the history of present illness.   Otherwise, review of systems are positive for weakness.   All other systems are reviewed and negative.    PHYSICAL EXAM: VS:  BP (!) 160/78   Pulse 69   Ht 5' (1.524 m)   Wt 146 lb 6.4 oz (66.4 kg)   SpO2 96%   BMI 28.59 kg/m  , BMI Body mass index is 28.59  kg/m. GEN: Well nourished, well developed, in no acute distress  HEENT: normal  Neck: no JVD, carotid bruits, or masses Cardiac: RRR; no murmurs, rubs, or gallops; mild ankle edema bilaterally Respiratory:  clear to auscultation bilaterally, normal work of breathing GI: soft, nontender, nondistended, + BS MS: no deformity or atrophy  Skin: warm and dry, no rash Neuro:  Strength and sensation are intact Psych: euthymic mood, full affect   EKG:   The ekg ordered today demonstrates    Recent Labs: 03/27/2016: Magnesium 2.1 05/31/2016: Hemoglobin 13.4; Platelets 304.0 08/29/2016: ALT 11; BUN 30; Creatinine, Ser 1.38; Potassium 4.5; Sodium 140; TSH 1.42   Lipid Panel    Component Value Date/Time   CHOL 189 08/29/2016 1050   TRIG 178.0 (H) 08/29/2016 1050   HDL 60.60 08/29/2016 1050   CHOLHDL 3 08/29/2016 1050   VLDL 35.6 08/29/2016 1050   LDLCALC 92 08/29/2016 1050   LDLDIRECT 90.0 05/05/2015 1027     Other studies Reviewed: Additional studies/ records that were reviewed today with results demonstrating:  LVEF 50-55% in 10/17.   ASSESSMENT AND PLAN:  1. CAD: Status post bypass surgery. Angina controlled on medical therapy. Continue aggressive secondary prevention. 2. AFib: Occurred post bypass. Off of amiodarone now.  In NSR. 3. HTN: Start amlodipine 5 mg daily.  She prefers to call in with home readings rather than come to the HTN clinic. 4. DM: A1C 6.9 in 3/18. 5. Hyperlipidemia:  Lipids controlled in 3/18.  TG slightly increased.  LDL target 70. I'm hesitant to increase her atorvastatin now because of her fatigue. Continue current therapy. 6. Hopefully, he will improve with more exercise. We talked about trying to walk to the point where she is slightly breathless. If she is not getting to that point, she may not be pushing herself hard enough.   Current medicines are reviewed at length with the patient today.  The patient concerns regarding her medicines were addressed.  The following changes have been made:  No change  Labs/ tests ordered today include:  No orders of the defined types were placed in this encounter.   Recommend 150 minutes/week of aerobic exercise Low fat, low carb, high fiber diet recommended  Disposition:   FU in 6 months   Signed, Larae Grooms, MD  11/23/2016 1:40 PM    [facility] [address], [place], [place]  [address] Phone: [phone]; Fax: [phone]

## 2016-11-23 NOTE — Patient Instructions (Signed)
Medication Instructions:  Your physician has recommended you make the following change in your medication:   START amlodipine 5 mg daily    Labwork: None ordered  Testing/Procedures: None ordered  Follow-Up: Your physician wants you to follow-up in: 6 months with Dr. Irish Lack. You will receive a reminder letter in the mail two months in advance. If you don't receive a letter, please call our office to schedule the follow-up appointment.   Any Other Special Instructions Will Be Listed Below (If Applicable).  Monitor your blood pressure for a couple of weeks and call us with the readings.    If you need a refill on your cardiac medications before your next appointment, please call your pharmacy.

## 2016-12-04 ENCOUNTER — Other Ambulatory Visit: Payer: Self-pay | Admitting: Internal Medicine

## 2017-01-02 ENCOUNTER — Other Ambulatory Visit: Payer: Self-pay | Admitting: Internal Medicine

## 2017-01-03 ENCOUNTER — Other Ambulatory Visit (INDEPENDENT_AMBULATORY_CARE_PROVIDER_SITE_OTHER): Payer: Medicare HMO

## 2017-01-03 ENCOUNTER — Encounter: Payer: Self-pay | Admitting: Internal Medicine

## 2017-01-03 ENCOUNTER — Ambulatory Visit (INDEPENDENT_AMBULATORY_CARE_PROVIDER_SITE_OTHER): Payer: Medicare HMO | Admitting: Internal Medicine

## 2017-01-03 VITALS — BP 120/60 | HR 72 | Temp 97.9°F | Ht 60.0 in | Wt 149.5 lb

## 2017-01-03 DIAGNOSIS — N183 Chronic kidney disease, stage 3 unspecified: Secondary | ICD-10-CM

## 2017-01-03 DIAGNOSIS — Z794 Long term (current) use of insulin: Secondary | ICD-10-CM

## 2017-01-03 DIAGNOSIS — I2510 Atherosclerotic heart disease of native coronary artery without angina pectoris: Secondary | ICD-10-CM

## 2017-01-03 DIAGNOSIS — E118 Type 2 diabetes mellitus with unspecified complications: Secondary | ICD-10-CM

## 2017-01-03 DIAGNOSIS — K582 Mixed irritable bowel syndrome: Secondary | ICD-10-CM | POA: Diagnosis not present

## 2017-01-03 DIAGNOSIS — D508 Other iron deficiency anemias: Secondary | ICD-10-CM

## 2017-01-03 DIAGNOSIS — I10 Essential (primary) hypertension: Secondary | ICD-10-CM | POA: Diagnosis not present

## 2017-01-03 DIAGNOSIS — D518 Other vitamin B12 deficiency anemias: Secondary | ICD-10-CM

## 2017-01-03 LAB — MICROALBUMIN / CREATININE URINE RATIO
Creatinine,U: 49.9 mg/dL
Microalb Creat Ratio: 3.2 mg/g (ref 0.0–30.0)
Microalb, Ur: 1.6 mg/dL (ref 0.0–1.9)

## 2017-01-03 LAB — CBC WITH DIFFERENTIAL/PLATELET
BASOS ABS: 0.1 10*3/uL (ref 0.0–0.1)
Basophils Relative: 1 % (ref 0.0–3.0)
EOS ABS: 0.2 10*3/uL (ref 0.0–0.7)
Eosinophils Relative: 2.5 % (ref 0.0–5.0)
HEMATOCRIT: 35.8 % — AB (ref 36.0–46.0)
Hemoglobin: 11.9 g/dL — ABNORMAL LOW (ref 12.0–15.0)
LYMPHS PCT: 29.2 % (ref 12.0–46.0)
Lymphs Abs: 2.6 10*3/uL (ref 0.7–4.0)
MCHC: 33.2 g/dL (ref 30.0–36.0)
MCV: 91.3 fl (ref 78.0–100.0)
MONO ABS: 0.5 10*3/uL (ref 0.1–1.0)
Monocytes Relative: 6.1 % (ref 3.0–12.0)
NEUTROS ABS: 5.5 10*3/uL (ref 1.4–7.7)
NEUTROS PCT: 61.2 % (ref 43.0–77.0)
PLATELETS: 327 10*3/uL (ref 150.0–400.0)
RBC: 3.91 Mil/uL (ref 3.87–5.11)
RDW: 13.7 % (ref 11.5–15.5)
WBC: 9 10*3/uL (ref 4.0–10.5)

## 2017-01-03 LAB — BASIC METABOLIC PANEL
BUN: 28 mg/dL — AB (ref 6–23)
CALCIUM: 9.9 mg/dL (ref 8.4–10.5)
CO2: 27 meq/L (ref 19–32)
CREATININE: 1.63 mg/dL — AB (ref 0.40–1.20)
Chloride: 99 mEq/L (ref 96–112)
GFR: 32 mL/min — ABNORMAL LOW (ref >60.00)
Glucose, Bld: 181 mg/dL — ABNORMAL HIGH (ref 70–99)
Potassium: 4.7 mEq/L (ref 3.5–5.1)
Sodium: 138 mEq/L (ref 135–145)

## 2017-01-03 LAB — URINALYSIS, ROUTINE W REFLEX MICROSCOPIC
BILIRUBIN URINE: NEGATIVE
HGB URINE DIPSTICK: NEGATIVE
KETONES UR: NEGATIVE
NITRITE: NEGATIVE
RBC / HPF: NONE SEEN (ref 0–?)
Specific Gravity, Urine: 1.01 (ref 1.000–1.030)
TOTAL PROTEIN, URINE-UPE24: NEGATIVE
URINE GLUCOSE: NEGATIVE
Urobilinogen, UA: 0.2 (ref 0.0–1.0)
pH: 5 (ref 5.0–8.0)

## 2017-01-03 LAB — HEMOGLOBIN A1C: HEMOGLOBIN A1C: 7.7 % — AB (ref 4.6–6.5)

## 2017-01-03 MED ORDER — HYOSCYAMINE SULFATE ER 0.375 MG PO TB12: 0.3750 mg | ORAL_TABLET | Freq: Two times a day (BID) | ORAL | 1 refills | Status: DC

## 2017-01-03 MED ORDER — METOPROLOL TARTRATE 25 MG PO TABS: 37.5000 mg | ORAL_TABLET | Freq: Two times a day (BID) | ORAL | 1 refills | Status: DC

## 2017-01-03 NOTE — Progress Notes (Signed)
Subjective:  Patient ID: Andrea Wilkinson, female    DOB: May 22, 1932  Age: 83 y.o. MRN: [id number]  CC: Anemia; Hypertension; and Diabetes   HPI Andrea Wilkinson presents for f/up - She complains of chronic weakness and dyspnea on exertion that started after she had her open heart surgery about 8 months ago. She denies any recent episodes of chest pain, edema, palpitations, diaphoresis, dizziness, or lightheadedness.  Outpatient Medications Prior to Visit  Medication Sig Dispense Refill  . amLODipine (NORVASC) 5 MG tablet Take 1 tablet (5 mg total) by mouth daily. 90 tablet 3  . aspirin EC 81 MG EC tablet Take 1 tablet (81 mg total) by mouth daily.    Marland Kitchen atorvastatin (LIPITOR) 40 MG tablet Take 1 tablet (40 mg total) by mouth daily at 6 PM. 90 tablet 3  . blood glucose meter kit and supplies KIT Use as directed to check blood sugar. DX: E11.9 1 each 0  . Blood Glucose Monitoring Suppl (ACCU-CHEK AVIVA PLUS) W/DEVICE KIT Use to check blood sugars daily 1 kit 0  . enalapril (VASOTEC) 20 MG tablet TAKE 1 TABLET EVERY DAY 90 tablet 1  . furosemide (LASIX) 20 MG tablet TAKE 1 TABLET EVERY DAY 90 tablet 1  . glucose blood (ACCU-CHEK AVIVA PLUS) test strip Check sugar twice per day DX: E11.09 200 each 3  . Lancets (ACCU-CHEK MULTICLIX) lancets Check sugar twice per day DX: E11.09 200 each 3  . metFORMIN (GLUCOPHAGE) 1000 MG tablet TAKE 1 TABLET TWICE DAILY WITH MEALS 180 tablet 3  . Multiple Vitamins-Minerals (PRESERVISION AREDS 2 PO) Take 1 capsule by mouth 2 (two) times daily.    Marland Kitchen omeprazole (PRILOSEC) 20 MG capsule TAKE 1 CAPSULE EVERY DAY 90 capsule 3  . oxybutynin (DITROPAN-XL) 5 MG 24 hr tablet Take 1 tablet (5 mg total) by mouth daily. (Patient taking differently: Take 5 mg by mouth daily as needed (for frequency). ) 90 tablet 3  . traMADol (ULTRAM) 50 MG tablet Take 1 tablet (50 mg total) by mouth every 6 (six) hours as needed for moderate pain. 270 tablet 1  . hyoscyamine (LEVBID) 0.375 MG 12  hr tablet Take 1 tablet (0.375 mg total) by mouth 2 (two) times daily. (Patient not taking: Reported on 01/03/2017) 180 tablet 0  . metoprolol tartrate (LOPRESSOR) 25 MG tablet Take 1.5 tablets (37.5 mg total) by mouth 2 (two) times daily. (Patient not taking: Reported on 01/03/2017) 270 tablet 1   No facility-administered medications prior to visit.     ROS Review of Systems  Constitutional: Negative for appetite change, diaphoresis, fatigue and unexpected weight change.  HENT: Negative.   Eyes: Negative.  Negative for visual disturbance.  Respiratory: Positive for shortness of breath. Negative for cough, chest tightness and wheezing.   Cardiovascular: Negative.  Negative for chest pain, palpitations and leg swelling.  Gastrointestinal: Negative.  Negative for abdominal pain, blood in stool, constipation, diarrhea, nausea and vomiting.       Occasional abdominal bloating and diarrhea well-controlled with Levbid.  Endocrine: Negative.   Genitourinary: Negative.   Musculoskeletal: Negative.  Negative for back pain and myalgias.  Skin: Negative.   Allergic/Immunologic: Negative.   Neurological: Positive for weakness. Negative for dizziness and light-headedness.  Hematological: Negative for adenopathy. Does not bruise/bleed easily.  Psychiatric/Behavioral: Negative.     Objective:  BP 120/60 (BP Location: Left Arm, Patient Position: Sitting, Cuff Size: Normal)   Pulse 72   Temp 97.9 F (36.6 C) (Oral)   Ht  5' (1.524 m)   Wt 149 lb 8 oz (67.8 kg)   SpO2 100%   BMI 29.20 kg/m   BP Readings from Last 3 Encounters:  01/03/17 120/60  11/23/16 (!) 160/78  10/19/16 (!) 160/78    Wt Readings from Last 3 Encounters:  01/03/17 149 lb 8 oz (67.8 kg)  11/23/16 146 lb 6.4 oz (66.4 kg)  10/19/16 150 lb 1.3 oz (68.1 kg)    Physical Exam  Constitutional: She is oriented to person, place, and time. No distress.  HENT:  Mouth/Throat: Oropharynx is clear and moist. No oropharyngeal  exudate.  Eyes: Conjunctivae are normal. Right eye exhibits no discharge. Left eye exhibits no discharge. No scleral icterus.  Neck: Normal range of motion. Neck supple. No JVD present. No thyromegaly present.  Cardiovascular: Normal rate, regular rhythm and intact distal pulses.  Exam reveals no gallop and no friction rub.   No murmur heard. Pulmonary/Chest: Effort normal and breath sounds normal. No respiratory distress. She has no wheezes. She has no rales. She exhibits no tenderness.  Abdominal: Soft. Bowel sounds are normal. She exhibits no distension and no mass. There is no tenderness. There is no rebound and no guarding.  Musculoskeletal: Normal range of motion. She exhibits no edema, tenderness or deformity.  Lymphadenopathy:    She has no cervical adenopathy.  Neurological: She is alert and oriented to person, place, and time.  Skin: Skin is warm and dry. No rash noted. She is not diaphoretic. No erythema. No pallor.  Vitals reviewed.   Lab Results  Component Value Date   WBC 9.0 01/03/2017   HGB 11.9 (L) 01/03/2017   HCT 35.8 (L) 01/03/2017   PLT 327.0 01/03/2017   GLUCOSE 181 (H) 01/03/2017   CHOL 189 08/29/2016   TRIG 178.0 (H) 08/29/2016   HDL 60.60 08/29/2016   LDLDIRECT 90.0 05/05/2015   LDLCALC 92 08/29/2016   ALT 11 08/29/2016   AST 14 08/29/2016   NA 138 01/03/2017   K 4.7 01/03/2017   CL 99 01/03/2017   CREATININE 1.63 (H) 01/03/2017   BUN 28 (H) 01/03/2017   CO2 27 01/03/2017   TSH 1.42 08/29/2016   INR 1.49 03/24/2016   HGBA1C 7.7 (H) 01/03/2017   MICROALBUR 1.6 01/03/2017    Dg Chest 2 View  Result Date: 04/27/2016 CLINICAL DATA:  History CABG 03/24/2016. Fatigue, shortness of breath EXAM: CHEST  2 VIEW COMPARISON:  03/29/2016 FINDINGS: There is no focal parenchymal opacity. There is no pleural effusion or pneumothorax. The heart and mediastinal contours are unremarkable. There is evidence of prior CABG. The osseous structures are unremarkable.  IMPRESSION: No active cardiopulmonary disease. Electronically Signed   By: Kathreen Devoid   On: 04/27/2016 14:06    Assessment & Plan:   Quierra was seen today for anemia, hypertension and diabetes.  Diagnoses and all orders for this visit:  Type 2 diabetes mellitus with complication, with long-term current use of insulin ([place])- her A1c is up to 7.7%. I don't think at her age it would be prudent to pursue tighter blood sugar control. I've asked her to continue her current regimen. -     Basic metabolic panel; Future -     Hemoglobin A1c; Future -     Microalbumin / creatinine urine ratio; Future  Essential hypertension- her blood pressure is well-controlled. Electrolytes and renal function are stable. -     Basic metabolic panel; Future -     Discontinue: metoprolol tartrate (LOPRESSOR) 25 MG tablet; Take  1.5 tablets (37.5 mg total) by mouth 2 (two) times daily. -     metoprolol tartrate (LOPRESSOR) 25 MG tablet; Take 1.5 tablets (37.5 mg total) by mouth 2 (two) times daily.  Dietary vitamin B12 deficiency anemia- H&H remained slightly low, will continue B12 replacement therapy. -     CBC with Differential/Platelet; Future -     cyanocobalamin ((VITAMIN B-12)) injection 1,000 mcg; Inject 1 mL (1,000 mcg total) into the muscle once.  Other iron deficiency anemia- H&H remained slightly low, will continue iron replacement therapy. -     CBC with Differential/Platelet; Future  CKD (chronic kidney disease) stage 3, GFR 30-59 ml/min- her renal function is stable. She will avoid nephrotoxic agents. We'll continue to control her blood pressure and blood sugar. -     Basic metabolic panel; Future -     Urinalysis, Routine w reflex microscopic; Future  Irritable bowel syndrome with both constipation and diarrhea -     Discontinue: hyoscyamine (LEVBID) 0.375 MG 12 hr tablet; Take 1 tablet (0.375 mg total) by mouth 2 (two) times daily. -     hyoscyamine (LEVBID) 0.375 MG 12 hr tablet; Take 1 tablet  (0.375 mg total) by mouth 2 (two) times daily.  Coronary artery disease involving native coronary artery of native heart without angina pectoris -     Discontinue: metoprolol tartrate (LOPRESSOR) 25 MG tablet; Take 1.5 tablets (37.5 mg total) by mouth 2 (two) times daily. -     metoprolol tartrate (LOPRESSOR) 25 MG tablet; Take 1.5 tablets (37.5 mg total) by mouth 2 (two) times daily.   I am having Ms. Paolillo maintain her ACCU-CHEK AVIVA PLUS, oxybutynin, aspirin, blood glucose meter kit and supplies, accu-chek multiclix, glucose blood, metFORMIN, Multiple Vitamins-Minerals (PRESERVISION AREDS 2 PO), traMADol, atorvastatin, omeprazole, amLODipine, furosemide, enalapril, metoprolol tartrate, and hyoscyamine. We administered cyanocobalamin.  Meds ordered this encounter  Medications  . DISCONTD: hyoscyamine (LEVBID) 0.375 MG 12 hr tablet    Sig: Take 1 tablet (0.375 mg total) by mouth 2 (two) times daily.    Dispense:  180 tablet    Refill:  1  . DISCONTD: metoprolol tartrate (LOPRESSOR) 25 MG tablet    Sig: Take 1.5 tablets (37.5 mg total) by mouth 2 (two) times daily.    Dispense:  270 tablet    Refill:  1  . metoprolol tartrate (LOPRESSOR) 25 MG tablet    Sig: Take 1.5 tablets (37.5 mg total) by mouth 2 (two) times daily.    Dispense:  270 tablet    Refill:  1  . hyoscyamine (LEVBID) 0.375 MG 12 hr tablet    Sig: Take 1 tablet (0.375 mg total) by mouth 2 (two) times daily.    Dispense:  180 tablet    Refill:  1  . cyanocobalamin ((VITAMIN B-12)) injection 1,000 mcg     Follow-up: Return in about 6 months (around 07/06/2017).  Scarlette Calico, MD

## 2017-01-03 NOTE — Patient Instructions (Signed)

## 2017-01-04 MED ORDER — CYANOCOBALAMIN 1000 MCG/ML IJ SOLN
1000.0000 ug | Freq: Once | INTRAMUSCULAR | Status: AC
  Administered 2017-01-03: 1000 ug via INTRAMUSCULAR

## 2017-01-11 ENCOUNTER — Other Ambulatory Visit: Payer: Self-pay | Admitting: Internal Medicine

## 2017-01-17 DIAGNOSIS — M5431 Sciatica, right side: Secondary | ICD-10-CM | POA: Diagnosis not present

## 2017-01-29 ENCOUNTER — Other Ambulatory Visit: Payer: Self-pay | Admitting: Internal Medicine

## 2017-02-21 DIAGNOSIS — M5431 Sciatica, right side: Secondary | ICD-10-CM | POA: Diagnosis not present

## 2017-02-28 ENCOUNTER — Other Ambulatory Visit: Payer: Self-pay | Admitting: Internal Medicine

## 2017-02-28 DIAGNOSIS — M150 Primary generalized (osteo)arthritis: Principal | ICD-10-CM

## 2017-02-28 DIAGNOSIS — M159 Polyosteoarthritis, unspecified: Secondary | ICD-10-CM

## 2017-02-28 DIAGNOSIS — M479 Spondylosis, unspecified: Secondary | ICD-10-CM

## 2017-03-01 NOTE — Telephone Encounter (Signed)
This has been faxed to [facility]

## 2017-03-20 NOTE — Progress Notes (Signed)
Pre visit review using our clinic review tool, if applicable. No additional management support is needed unless otherwise documented below in the visit note. 

## 2017-03-20 NOTE — Progress Notes (Signed)
Subjective:   Andrea Wilkinson is a 83 y.o. female who presents for Medicare Annual (Subsequent) preventive examination.  Review of Systems:  No ROS.  Medicare Wellness Visit. Additional risk factors are reflected in the social history.  Cardiac Risk Factors include: advanced age (>33mn, >>24women);diabetes mellitus;dyslipidemia;hypertension;obesity (BMI >30kg/m2) Sleep patterns: feels rested on waking, gets up 1 times nightly to void and sleeps 7-8 hours nightly.    Home Safety/Smoke Alarms: Feels safe in home. Smoke alarms in place.  Living environment; residence and Firearm Safety: 1-story house/ trailer, equipment: HHydrologist Type: Tub SSurveyor, quantity no firearms. Lives with son, no needs for DME, good support system Seat Belt Safety/Bike Helmet: Wears seat belt.     Objective:     Vitals: BP 128/72   Pulse 69   Resp 20   Ht 5' (1.524 m)   Wt 147 lb (66.7 kg)   SpO2 98%   BMI 28.71 kg/m   Body mass index is 28.71 kg/m.   Tobacco History  Smoking Status  . Never Smoker  Smokeless Tobacco  . Never Used     Counseling given: Not Answered   Past Medical History:  Diagnosis Date  . Cancer ([place]    basal cell cancer on face  . Cervical spine arthritis   . Depression   . Diabetic retinopathy ([place]   . DJD (degenerative joint disease)   . DM (diabetes mellitus), type 2 ([place]    type 2  . Esophageal stricture   . GERD (gastroesophageal reflux disease)   . History of hiatal hernia   . Hypertension   . Myocardial infarction ([facility]    Past Surgical History:  Procedure Laterality Date  . ABDOMINAL HYSTERECTOMY    . AIR/FLUID EXCHANGE Left 08/10/2015   Procedure: AIR/FLUID EXCHANGE, left eye;  Surgeon: JHayden Pedro MD;  [place] [facility]  Service: Ophthalmology;  Laterality: Left;  . benign tumor removed from left hip    . CARDIAC CATHETERIZATION N/A 03/22/2016   Procedure: Left Heart Cath and Coronary Angiography;  Surgeon: JJettie Booze MD;  [place];  Service: Cardiovascular;  Laterality: N/A;  . CATARACT EXTRACTION, BILATERAL  July 2013   Left done on 12/18/11 and right done on 01/15/2012  . CHOLECYSTECTOMY    . CORONARY ARTERY BYPASS GRAFT N/A 03/24/2016   Procedure: CORONARY ARTERY BYPASS GRAFTING (CABG) x 4 utilizing left internal mammary artery and left greater saphenous vein, bilateral saphenous veins harvested endoscopicaly,  SVG to PD, LIMA  to LAD, SEQ SVG to OM1 and OM2;  Surgeon: EGrace Isaac MD;  [place] [facility]  Service: Open Heart Surgery;  Laterality: N/A;  . DILATION AND CURETTAGE OF UTERUS    . LASER PHOTO ABLATION Left 08/10/2015   Procedure: LASER PHOTO ABLATION, left eye;  Surgeon: JHayden Pedro MD;  [place]  Service: Ophthalmology;  Laterality: Left;  . left breast lumpectomy    . MEMBRANE PEEL Left 08/10/2015   Procedure: MEMBRANE PEEL, left eye;  Surgeon: JHayden Pedro MD;  [place] [facility]  Service: Ophthalmology;  Laterality: Left;  . PARS PLANA VITRECTOMY Left 08/10/2015   Procedure: PARS PLANA VITRECTOMY WITH 25 GAUGE, headscope laser;  Surgeon: JHayden Pedro MD;  [place]  Service: Ophthalmology;  Laterality: Left;  . TEE WITHOUT CARDIOVERSION N/A 03/24/2016   Procedure: TRANSESOPHAGEAL ECHOCARDIOGRAM (TEE);  Surgeon: EGrace Isaac MD;  [place]  Service: Open Heart Surgery;  Laterality: N/A;  .  TOTAL KNEE ARTHROPLASTY     left Jan '12; right May '12 - by Dr. Alvan Dame  . TUBAL LIGATION     Family History  Problem Relation Age of Onset  . Diabetes Father    History  Sexual Activity  . Sexual activity: Not Currently    Outpatient Encounter Prescriptions as of 03/21/2017  Medication Sig  . ACCU-CHEK SOFTCLIX LANCETS lancets CHECK SUGAR TWICE PER DAY  . aspirin EC 81 MG EC tablet Take 1 tablet (81 mg total) by mouth daily.  Marland Kitchen atorvastatin (LIPITOR) 40 MG tablet Take 1 tablet (40 mg total) by mouth daily at 6 PM.  . blood glucose meter kit and supplies KIT  Use as directed to check blood sugar. DX: E11.9  . Blood Glucose Monitoring Suppl (ACCU-CHEK AVIVA PLUS) W/DEVICE KIT Use to check blood sugars daily  . enalapril (VASOTEC) 20 MG tablet TAKE 1 TABLET EVERY DAY  . furosemide (LASIX) 20 MG tablet TAKE 1 TABLET EVERY DAY  . hyoscyamine (LEVBID) 0.375 MG 12 hr tablet Take 1 tablet (0.375 mg total) by mouth 2 (two) times daily.  . meloxicam (MOBIC) 15 MG tablet TAKE 1 TABLET EVERY DAY  . metFORMIN (GLUCOPHAGE) 1000 MG tablet TAKE 1 TABLET TWICE DAILY WITH MEALS  . metoprolol tartrate (LOPRESSOR) 25 MG tablet Take 1.5 tablets (37.5 mg total) by mouth 2 (two) times daily.  Marland Kitchen omeprazole (PRILOSEC) 20 MG capsule TAKE 1 CAPSULE EVERY DAY  . oxybutynin (DITROPAN-XL) 5 MG 24 hr tablet Take 1 tablet (5 mg total) by mouth daily. (Patient taking differently: Take 5 mg by mouth daily as needed (for frequency). )  . traMADol (ULTRAM) 50 MG tablet TAKE 1 TABLET EVERY 6 HOURS AS NEEDED  FOR  MODERATE  PAIN  . ACCU-CHEK AVIVA PLUS test strip CHECK  SUGAR TWICE DAILY  . amLODipine (NORVASC) 5 MG tablet Take 1 tablet (5 mg total) by mouth daily.  . [DISCONTINUED] Multiple Vitamins-Minerals (PRESERVISION AREDS 2 PO) Take 1 capsule by mouth 2 (two) times daily.   No facility-administered encounter medications on file as of 03/21/2017.     Activities of Daily Living In your present state of health, do you have any difficulty performing the following activities: 03/21/2017 03/22/2016  Hearing? N N  Comment - -  Vision? N N  Difficulty concentrating or making decisions? Y N  Walking or climbing stairs? N N  Dressing or bathing? N N  Doing errands, shopping? N N  Preparing Food and eating ? N -  Using the Toilet? N -  In the past six months, have you accidently leaked urine? Y -  Do you have problems with loss of bowel control? Y -  Managing your Medications? N -  Managing your Finances? N -  Housekeeping or managing your Housekeeping? N -  Some recent data might  be hidden    Patient Care Team: Janith Lima, MD as PCP - General (Internal Medicine)    Assessment:    Physical assessment deferred to PCP.  Exercise Activities and Dietary recommendations Current Exercise Habits: Home exercise routine, Type of exercise: walking, Time (Minutes): 35, Frequency (Times/Week): 2, Weekly Exercise (Minutes/Week): 70, Intensity: Mild, Exercise limited by: orthopedic condition(s)  Diet (meal preparation, eat out, water intake, caffeinated beverages, dairy products, fruits and vegetables): in general, a "healthy" diet    Reviewed heart healthy and diabetic diet, encouraged patient to increase daily water intake. Diet education was attached to patient's AVS.  Goals    . Increase  the amount of physical activity          When the weather gets cooler I will walk at least 2 times per week in my neighborhood.       Fall Risk Fall Risk  03/21/2017 11/04/2015 11/04/2015 05/10/2015 05/05/2015  Falls in the past year? _0   Risk for fall due to : Impaired balance/gait - - - -   Depression Screen PHQ 2/9 Scores 03/21/2017 01/03/2017 11/04/2015 11/04/2015  PHQ - 2 Score 2 5 0 0  PHQ- 9 Score 3 9 - -     Cognitive Function MMSE - Mini Mental State Exam 03/21/2017  Orientation to time 5  Orientation to Place 5  Registration 3  Attention/ Calculation 5  Recall 1  Language- name 2 objects 2  Language- repeat 1  Language- follow 3 step command 3  Language- read & follow direction 1  Write a sentence 1  Copy design 1  Total score 28        Immunization History  Administered Date(s) Administered  . Influenza Split 02/26/2012  . Influenza Whole 03/19/1998, 03/19/2009, 04/26/2010  . Influenza, High Dose Seasonal PF 05/01/2013, 05/05/2015  . Influenza,inj,Quad PF,6+ Mos 04/07/2014  . Influenza-Unspecified 03/19/2016  . Pneumococcal Conjugate-13 08/17/2014  . Pneumococcal Polysaccharide-23 05/06/2009  . Td 05/06/2009   Screening Tests Health  Maintenance  Topic Date Due  . INFLUENZA VACCINE  01/17/2017  . FOOT EXAM  08/29/2017  . TETANUS/TDAP  05/07/2019  . DEXA SCAN  Completed  . PNA vac Low Risk Adult  Completed  . OPHTHALMOLOGY EXAM  Excluded      Plan:  Continue doing brain stimulating activities (puzzles, reading, adult coloring books, staying active) to keep memory sharp.   Continue to eat heart healthy diet (full of fruits, vegetables, whole grains, lean protein, water--limit salt, fat, and sugar intake) and increase physical activity as tolerated. Please increase the amount of water and fluid you drink daily.      I have personally reviewed and noted the following in the patient's chart:   . Medical and social history . Use of alcohol, tobacco or illicit drugs  . Current medications and supplements . Functional ability and status . Nutritional status . Physical activity . Advanced directives . List of other physicians . Vitals . Screenings to include cognitive, depression, and falls . Referrals and appointments  In addition, I have reviewed and discussed with patient certain preventive protocols, quality metrics, and best practice recommendations. A written personalized care plan for preventive services as well as general preventive health recommendations were provided to patient.     Michiel Cowboy, RN  03/21/2017

## 2017-03-21 ENCOUNTER — Ambulatory Visit (INDEPENDENT_AMBULATORY_CARE_PROVIDER_SITE_OTHER): Payer: Medicare HMO | Admitting: *Deleted

## 2017-03-21 VITALS — BP 128/72 | HR 69 | Resp 20 | Ht 60.0 in | Wt 147.0 lb

## 2017-03-21 DIAGNOSIS — Z23 Encounter for immunization: Secondary | ICD-10-CM

## 2017-03-21 DIAGNOSIS — Z0000 Encounter for general adult medical examination without abnormal findings: Secondary | ICD-10-CM

## 2017-03-21 NOTE — Patient Instructions (Addendum)
Continue doing brain stimulating activities (puzzles, reading, adult coloring books, staying active) to keep memory sharp.   Continue to eat heart healthy diet (full of fruits, vegetables, whole grains, lean protein, water--limit salt, fat, and sugar intake) and increase physical activity as tolerated. Please increase the amount of water and fluid you drink daily.    Andrea Wilkinson , Thank you for taking time to come for your Medicare Wellness Visit. I appreciate your ongoing commitment to your health goals. Please review the following plan we discussed and let me know if I can assist you in the future.   These are the goals we discussed: Goals    . Increase the amount of physical activity          When the weather gets cooler I will walk at least 2 times per week in my neighborhood.        This is a list of the screening recommended for you and due dates:  Health Maintenance  Topic Date Due  . Flu Shot  01/17/2017  . Complete foot exam   08/29/2017  . Tetanus Vaccine  05/07/2019  . DEXA scan (bone density measurement)  Completed  . Pneumonia vaccines  Completed  . Eye exam for diabetics  Excluded    Influenza Virus Vaccine injection What is this medicine? INFLUENZA VIRUS VACCINE (in floo EN zuh VAHY ruhs vak SEEN) helps to reduce the risk of getting influenza also known as the flu. The vaccine only helps protect you against some strains of the flu. This medicine may be used for other purposes; ask your health care provider or pharmacist if you have questions. COMMON BRAND NAME(S): Afluria, Agriflu, Alfuria, FLUAD, Fluarix, Fluarix Quadrivalent, Flublok, Flublok Quadrivalent, FLUCELVAX, Flulaval, Fluvirin, Fluzone, Fluzone High-Dose, Fluzone Intradermal What should I tell my health care provider before I take this medicine? They need to know if you have any of these conditions: -bleeding disorder like hemophilia -fever or infection -Guillain-Barre syndrome or other neurological  problems -immune system problems -infection with the human immunodeficiency virus (HIV) or AIDS -low blood platelet counts -multiple sclerosis -an unusual or allergic reaction to influenza virus vaccine, latex, other medicines, foods, dyes, or preservatives. Different brands of vaccines contain different allergens. Some may contain latex or eggs. Talk to your doctor about your allergies to make sure that you get the right vaccine. -pregnant or trying to get pregnant -breast-feeding How should I use this medicine? This vaccine is for injection into a muscle or under the skin. It is given by a health care professional. A copy of Vaccine Information Statements will be given before each vaccination. Read this sheet carefully each time. The sheet may change frequently. Talk to your healthcare provider to see which vaccines are right for you. Some vaccines should not be used in all age groups. Overdosage: If you think you have taken too much of this medicine contact a poison control center or emergency room at once. NOTE: This medicine is only for you. Do not share this medicine with others. What if I miss a dose? This does not apply. What may interact with this medicine? -chemotherapy or radiation therapy -medicines that lower your immune system like etanercept, anakinra, infliximab, and adalimumab -medicines that treat or prevent blood clots like warfarin -phenytoin -steroid medicines like prednisone or cortisone -theophylline -vaccines This list may not describe all possible interactions. Give your health care provider a list of all the medicines, herbs, non-prescription drugs, or dietary supplements you use. Also tell them if  you smoke, drink alcohol, or use illegal drugs. Some items may interact with your medicine. What should I watch for while using this medicine? Report any side effects that do not go away within 3 days to your doctor or health care professional. Call your health care  provider if any unusual symptoms occur within 6 weeks of receiving this vaccine. You may still catch the flu, but the illness is not usually as bad. You cannot get the flu from the vaccine. The vaccine will not protect against colds or other illnesses that may cause fever. The vaccine is needed every year. What side effects may I notice from receiving this medicine? Side effects that you should report to your doctor or health care professional as soon as possible: -allergic reactions like skin rash, itching or hives, swelling of the face, lips, or tongue Side effects that usually do not require medical attention (report to your doctor or health care professional if they continue or are bothersome): -fever -headache -muscle aches and pains -pain, tenderness, redness, or swelling at the injection site -tiredness This list may not describe all possible side effects. Call your doctor for medical advice about side effects. You may report side effects to FDA at 1-800-FDA-1088. Where should I keep my medicine? The vaccine will be given by a health care professional in a clinic, pharmacy, doctor's office, or other health care setting. You will not be given vaccine doses to store at home. NOTE: This sheet is a summary. It may not cover all possible information. If you have questions about this medicine, talk to your doctor, pharmacist, or health care provider.  2018 Elsevier/Gold Standard (2014-12-25 10:07:28)  Fat and Cholesterol Restricted Diet Getting too much fat and cholesterol in your diet may cause health problems. Following this diet helps keep your fat and cholesterol at normal levels. This can keep you from getting sick. What types of fat should I choose?  Choose monosaturated and polyunsaturated fats. These are found in foods such as olive oil, canola oil, flaxseeds, walnuts, almonds, and seeds.  Eat more omega-3 fats. Good choices include salmon, mackerel, sardines, tuna, flaxseed oil, and  ground flaxseeds.  Limit saturated fats. These are in animal products such as meats, butter, and cream. They can also be in plant products such as palm oil, palm kernel oil, and coconut oil.  Avoid foods with partially hydrogenated oils in them. These contain trans fats. Examples of foods that have trans fats are stick margarine, some tub margarines, cookies, crackers, and other baked goods. What general guidelines do I need to follow?  Check food labels. Look for the words "trans fat" and "saturated fat."  When preparing a meal: ? Fill half of your plate with vegetables and green salads. ? Fill one fourth of your plate with whole grains. Look for the word "whole" as the first word in the ingredient list. ? Fill one fourth of your plate with lean protein foods.  Eat more foods that have fiber, like apples, carrots, beans, peas, and barley.  Eat more home-cooked foods. Eat less at restaurants and buffets.  Limit or avoid alcohol.  Limit foods high in starch and sugar.  Limit fried foods.  Cook foods without frying them. Baking, boiling, grilling, and broiling are all great options.  Lose weight if you are overweight. Losing even a small amount of weight can help your overall health. It can also help prevent diseases such as diabetes and heart disease. What foods can I eat? Grains Whole grains,  such as whole wheat or whole grain breads, crackers, cereals, and pasta. Unsweetened oatmeal, bulgur, barley, quinoa, or brown rice. Corn or whole wheat flour tortillas. Vegetables Fresh or frozen vegetables (raw, steamed, roasted, or grilled). Green salads. Fruits All fresh, canned (in natural juice), or frozen fruits. Meat and Other Protein Products Ground beef (85% or leaner), grass-fed beef, or beef trimmed of fat. Skinless chicken or [place]. Ground chicken or [place]. Pork trimmed of fat. All fish and seafood. Eggs. Dried beans, peas, or lentils. Unsalted nuts or seeds. Unsalted canned or  dry beans. Dairy Low-fat dairy products, such as skim or 1% milk, 2% or reduced-fat cheeses, low-fat ricotta or cottage cheese, or plain low-fat yogurt. Fats and Oils Tub margarines without trans fats. Light or reduced-fat mayonnaise and salad dressings. Avocado. Olive, canola, sesame, or safflower oils. Natural peanut or almond butter (choose ones without added sugar and oil). The items listed above may not be a complete list of recommended foods or beverages. Contact your dietitian for more options. What foods are not recommended? Grains White bread. White pasta. White rice. Cornbread. Bagels, pastries, and croissants. Crackers that contain trans fat. Vegetables White potatoes. Corn. Creamed or fried vegetables. Vegetables in a cheese sauce. Fruits Dried fruits. Canned fruit in light or heavy syrup. Fruit juice. Meat and Other Protein Products Fatty cuts of meat. Ribs, chicken wings, bacon, sausage, bologna, salami, chitterlings, fatback, hot dogs, bratwurst, and packaged luncheon meats. Liver and organ meats. Dairy Whole or 2% milk, cream, half-and-half, and cream cheese. Whole milk cheeses. Whole-fat or sweetened yogurt. Full-fat cheeses. Nondairy creamers and whipped toppings. Processed cheese, cheese spreads, or cheese curds. Sweets and Desserts Corn syrup, sugars, honey, and molasses. Candy. Jam and jelly. Syrup. Sweetened cereals. Cookies, pies, cakes, donuts, muffins, and ice cream. Fats and Oils Butter, stick margarine, lard, shortening, ghee, or bacon fat. Coconut, palm kernel, or palm oils. Beverages Alcohol. Sweetened drinks (such as sodas, lemonade, and fruit drinks or punches). The items listed above may not be a complete list of foods and beverages to avoid. Contact your dietitian for more information. This information is not intended to replace advice given to you by your health care provider. Make sure you discuss any questions you have with your health care  provider. Document Released: 12/05/2011 Document Revised: 02/10/2016 Document Reviewed: 09/04/2013 Elsevier Interactive Patient Education  2018 [employer].  Carbohydrate Counting for Diabetes Mellitus, Adult Carbohydrate counting is a method for keeping track of how many carbohydrates you eat. Eating carbohydrates naturally increases the amount of sugar (glucose) in the blood. Counting how many carbohydrates you eat helps keep your blood glucose within normal limits, which helps you manage your diabetes (diabetes mellitus). It is important to know how many carbohydrates you can safely have in each meal. This is different for every person. A diet and nutrition specialist (registered dietitian) can help you make a meal plan and calculate how many carbohydrates you should have at each meal and snack. Carbohydrates are found in the following foods:  Grains, such as breads and cereals.  Dried beans and soy products.  Starchy vegetables, such as potatoes, peas, and corn.  Fruit and fruit juices.  Milk and yogurt.  Sweets and snack foods, such as cake, cookies, candy, chips, and soft drinks.  How do I count carbohydrates? There are two ways to count carbohydrates in food. You can use either of the methods or a combination of both. Reading "Nutrition Facts" on packaged food The "Nutrition Facts" list is included  on the labels of almost all packaged foods and beverages in the [place] It includes:  The serving size.  Information about nutrients in each serving, including the grams (g) of carbohydrate per serving.  To use the "Nutrition Facts":  Decide how many servings you will have.  Multiply the number of servings by the number of carbohydrates per serving.  The resulting number is the total amount of carbohydrates that you will be having.  Learning standard serving sizes of other foods When you eat foods containing carbohydrates that are not packaged or do not include "Nutrition  Facts" on the label, you need to measure the servings in order to count the amount of carbohydrates:  Measure the foods that you will eat with a food scale or measuring cup, if needed.  Decide how many standard-size servings you will eat.  Multiply the number of servings by 15. Most carbohydrate-rich foods have about 15 g of carbohydrates per serving. ? For example, if you eat 8 oz (170 g) of strawberries, you will have eaten 2 servings and 30 g of carbohydrates (2 servings x 15 g = 30 g).  For foods that have more than one food mixed, such as soups and casseroles, you must count the carbohydrates in each food that is included.  The following list contains standard serving sizes of common carbohydrate-rich foods. Each of these servings has about 15 g of carbohydrates:   hamburger bun or  English muffin.   oz (15 mL) syrup.   oz (14 g) jelly.  1 slice of bread.  1 six-inch tortilla.  3 oz (85 g) cooked rice or pasta.  4 oz (113 g) cooked dried beans.  4 oz (113 g) starchy vegetable, such as peas, corn, or potatoes.  4 oz (113 g) hot cereal.  4 oz (113 g) mashed potatoes or  of a large baked potato.  4 oz (113 g) canned or frozen fruit.  4 oz (120 mL) fruit juice.  4-6 crackers.  6 chicken nuggets.  6 oz (170 g) unsweetened dry cereal.  6 oz (170 g) plain fat-free yogurt or yogurt sweetened with artificial sweeteners.  8 oz (240 mL) milk.  8 oz (170 g) fresh fruit or one small piece of fruit.  24 oz (680 g) popped popcorn.  Example of carbohydrate counting Sample meal  3 oz (85 g) chicken breast.  6 oz (170 g) brown rice.  4 oz (113 g) corn.  8 oz (240 mL) milk.  8 oz (170 g) strawberries with sugar-free whipped topping. Carbohydrate calculation 1. Identify the foods that contain carbohydrates: ? Rice. ? Corn. ? Milk. ? Strawberries. 2. Calculate how many servings you have of each food: ? 2 servings rice. ? 1 serving corn. ? 1 serving  milk. ? 1 serving strawberries. 3. Multiply each number of servings by 15 g: ? 2 servings rice x 15 g = 30 g. ? 1 serving corn x 15 g = 15 g. ? 1 serving milk x 15 g = 15 g. ? 1 serving strawberries x 15 g = 15 g. 4. Add together all of the amounts to find the total grams of carbohydrates eaten: ? 30 g + 15 g + 15 g + 15 g = 75 g of carbohydrates total. This information is not intended to replace advice given to you by your health care provider. Make sure you discuss any questions you have with your health care provider. Document Released: 06/05/2005 Document Revised: 12/24/2015 Document Reviewed: 11/17/2015  Chartered certified accountant Patient Education  [employer].

## 2017-03-23 NOTE — Progress Notes (Signed)
Medical screening examination/treatment/procedure(s) were performed by the Wellness Coach, RN. As primary care provider I was immediately available for consulation/collaboration. I agree with above documentation. Arliss Frisina, AGNP-C 

## 2017-04-16 ENCOUNTER — Other Ambulatory Visit: Payer: Self-pay | Admitting: Internal Medicine

## 2017-04-25 ENCOUNTER — Ambulatory Visit (INDEPENDENT_AMBULATORY_CARE_PROVIDER_SITE_OTHER): Payer: Medicare HMO

## 2017-04-25 DIAGNOSIS — E538 Deficiency of other specified B group vitamins: Secondary | ICD-10-CM | POA: Diagnosis not present

## 2017-04-25 MED ORDER — CYANOCOBALAMIN 1000 MCG/ML IJ SOLN
1000.0000 ug | Freq: Once | INTRAMUSCULAR | Status: AC
  Administered 2017-04-25: 1000 ug via INTRAMUSCULAR

## 2017-05-14 ENCOUNTER — Other Ambulatory Visit: Payer: Self-pay | Admitting: Internal Medicine

## 2017-05-15 ENCOUNTER — Ambulatory Visit: Payer: Medicare HMO

## 2017-05-21 ENCOUNTER — Other Ambulatory Visit: Payer: Self-pay | Admitting: Internal Medicine

## 2017-05-21 DIAGNOSIS — K582 Mixed irritable bowel syndrome: Secondary | ICD-10-CM

## 2017-05-22 ENCOUNTER — Ambulatory Visit: Payer: Medicare HMO

## 2017-05-25 ENCOUNTER — Ambulatory Visit: Payer: Medicare HMO

## 2017-05-30 ENCOUNTER — Other Ambulatory Visit: Payer: Self-pay | Admitting: Internal Medicine

## 2017-05-30 NOTE — Telephone Encounter (Signed)
Copied from [place]. Topic: Quick Communication - Rx Refill/Question >> May 30, 2017 12:21 PM Robina Ade, Helene Kelp D wrote: Has the patient contacted their pharmacy? Yes (Agent: If no, request that the patient contact the pharmacy for the refill.) Preferred Pharmacy (with phone number or street name): [place], [place]. Agent: Please be advised that RX refills may take up to 3 business days. We ask that you follow-up with your pharmacy. Patient needs refill on her traMADol (ULTRAM) 50 MG tablet.

## 2017-05-31 NOTE — Telephone Encounter (Signed)
Advised pt to contact [facility] for refills. Prescription was written in September with 5 refills. Pt verbalized understanding.

## 2017-06-01 ENCOUNTER — Ambulatory Visit: Payer: Medicare HMO

## 2017-06-05 ENCOUNTER — Ambulatory Visit (INDEPENDENT_AMBULATORY_CARE_PROVIDER_SITE_OTHER): Payer: Medicare HMO

## 2017-06-05 DIAGNOSIS — D518 Other vitamin B12 deficiency anemias: Secondary | ICD-10-CM

## 2017-06-05 MED ORDER — CYANOCOBALAMIN 1000 MCG/ML IJ SOLN
1000.0000 ug | Freq: Once | INTRAMUSCULAR | Status: AC
  Administered 2017-06-05: 1000 ug via INTRAMUSCULAR

## 2017-06-07 ENCOUNTER — Encounter: Payer: Self-pay | Admitting: Internal Medicine

## 2017-06-07 ENCOUNTER — Ambulatory Visit: Payer: Medicare HMO | Admitting: Internal Medicine

## 2017-06-07 VITALS — BP 130/70 | HR 84 | Temp 98.4°F | Resp 16 | Ht 60.0 in | Wt 154.2 lb

## 2017-06-07 DIAGNOSIS — C4499 Other specified malignant neoplasm of skin, unspecified: Secondary | ICD-10-CM | POA: Diagnosis not present

## 2017-06-07 NOTE — Progress Notes (Signed)
Subjective:  Patient ID: Andrea Wilkinson, female    DOB: [birth year]  Age: 83 y.o. MRN: [id number]  CC: Rash   HPI Andrea Wilkinson presents for concerns about an asx lesion on the dorsum of her left forearm that has been present for about 3 weeks.  Denies trauma, pain, itching, swelling, or drainage.  Outpatient Medications Prior to Visit  Medication Sig Dispense Refill  . ACCU-CHEK AVIVA PLUS test strip CHECK  SUGAR TWICE DAILY 200 each 3  . ACCU-CHEK SOFTCLIX LANCETS lancets CHECK SUGAR TWICE PER DAY 200 each 3  . aspirin EC 81 MG EC tablet Take 1 tablet (81 mg total) by mouth daily.    Andrea Kitchen atorvastatin (LIPITOR) 40 MG tablet Take 1 tablet (40 mg total) by mouth daily at 6 PM. 90 tablet 3  . blood glucose meter kit and supplies KIT Use as directed to check blood sugar. DX: E11.9 1 each 0  . Blood Glucose Monitoring Suppl (ACCU-CHEK AVIVA PLUS) W/DEVICE KIT Use to check blood sugars daily 1 kit 0  . enalapril (VASOTEC) 20 MG tablet TAKE 1 TABLET EVERY DAY 90 tablet 1  . furosemide (LASIX) 20 MG tablet TAKE 1 TABLET EVERY DAY 90 tablet 1  . hyoscyamine (LEVBID) 0.375 MG 12 hr tablet TAKE 1 TABLET (0.375 MG TOTAL) BY MOUTH 2 (TWO) TIMES DAILY. 180 tablet 1  . meloxicam (MOBIC) 15 MG tablet TAKE 1 TABLET EVERY DAY 90 tablet 1  . metFORMIN (GLUCOPHAGE) 1000 MG tablet TAKE 1 TABLET TWICE DAILY WITH MEALS 180 tablet 1  . metoprolol tartrate (LOPRESSOR) 25 MG tablet Take 1.5 tablets (37.5 mg total) by mouth 2 (two) times daily. 270 tablet 1  . omeprazole (PRILOSEC) 20 MG capsule TAKE 1 CAPSULE EVERY DAY 90 capsule 3  . oxybutynin (DITROPAN-XL) 5 MG 24 hr tablet Take 1 tablet (5 mg total) by mouth daily. (Patient taking differently: Take 5 mg by mouth daily as needed (for frequency). ) 90 tablet 3  . traMADol (ULTRAM) 50 MG tablet TAKE 1 TABLET EVERY 6 HOURS AS NEEDED  FOR  MODERATE  PAIN 120 tablet 5  . amLODipine (NORVASC) 5 MG tablet Take 1 tablet (5 mg total) by mouth daily. 90 tablet 3   No  facility-administered medications prior to visit.     ROS Review of Systems  All other systems reviewed and are negative.   Objective:  BP 130/70 (BP Location: Left Arm, Patient Position: Sitting, Cuff Size: Normal)   Pulse 84   Temp 98.4 F (36.9 C) (Oral)   Resp 16   Ht 5' (1.524 m)   Wt 154 lb 4 oz (70 kg)   SpO2 98%   BMI 30.12 kg/m   BP Readings from Last 3 Encounters:  06/07/17 130/70  03/21/17 128/72  01/03/17 120/60    Wt Readings from Last 3 Encounters:  06/07/17 154 lb 4 oz (70 kg)  03/21/17 147 lb (66.7 kg)  01/03/17 149 lb 8 oz (67.8 kg)    Physical Exam  Musculoskeletal:       Arms:   Lab Results  Component Value Date   WBC 9.0 01/03/2017   HGB 11.9 (L) 01/03/2017   HCT 35.8 (L) 01/03/2017   PLT 327.0 01/03/2017   GLUCOSE 181 (H) 01/03/2017   CHOL 189 08/29/2016   TRIG 178.0 (H) 08/29/2016   HDL 60.60 08/29/2016   LDLDIRECT 90.0 05/05/2015   LDLCALC 92 08/29/2016   ALT 11 08/29/2016   AST 14 08/29/2016  NA 138 01/03/2017   K 4.7 01/03/2017   CL 99 01/03/2017   CREATININE 1.63 (H) 01/03/2017   BUN 28 (H) 01/03/2017   CO2 27 01/03/2017   TSH 1.42 08/29/2016   INR 1.49 03/24/2016   HGBA1C 7.7 (H) 01/03/2017   MICROALBUR 1.6 01/03/2017    Dg Chest 2 View  Result Date: 04/27/2016 CLINICAL DATA:  History CABG 03/24/2016. Fatigue, shortness of breath EXAM: CHEST  2 VIEW COMPARISON:  03/29/2016 FINDINGS: There is no focal parenchymal opacity. There is no pleural effusion or pneumothorax. The heart and mediastinal contours are unremarkable. There is evidence of prior CABG. The osseous structures are unremarkable. IMPRESSION: No active cardiopulmonary disease. Electronically Signed   By: Kathreen Devoid   On: 04/27/2016 14:06    Assessment & Plan:   Shuntell was seen today for rash.  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  I have  asked her to see dermatology about this. -     Ambulatory referral to Dermatology   I am having Andrea Wilkinson "Andrea Wilkinson" maintain her ACCU-CHEK AVIVA PLUS, oxybutynin, aspirin, blood glucose meter kit and supplies, atorvastatin, omeprazole, amLODipine, metoprolol tartrate, ACCU-CHEK SOFTCLIX LANCETS, ACCU-CHEK AVIVA PLUS, traMADol, furosemide, metFORMIN, hyoscyamine, enalapril, and meloxicam.  No orders of the defined types were placed in this encounter.    Follow-up: No Follow-up on file.  Scarlette Calico, MD

## 2017-06-26 ENCOUNTER — Telehealth: Payer: Self-pay | Admitting: Internal Medicine

## 2017-06-26 NOTE — Telephone Encounter (Signed)
Copied from [place] [phone]. Topic: Quick Communication - Rx Refill/Question >> Jun 26, 2017  3:26 PM Carolyn Stare wrote: Has the patient contacted their pharmacy   yes     RX   traMADol (ULTRAM) 50 MG tablet  Pharmacy  [facility]     Agent: Please be advised that RX refills may take up to 3 business days. We ask that you follow-up with your pharmacy.

## 2017-06-27 ENCOUNTER — Other Ambulatory Visit: Payer: Self-pay | Admitting: Internal Medicine

## 2017-06-27 DIAGNOSIS — M479 Spondylosis, unspecified: Secondary | ICD-10-CM

## 2017-06-27 DIAGNOSIS — M159 Polyosteoarthritis, unspecified: Secondary | ICD-10-CM

## 2017-06-27 DIAGNOSIS — M150 Primary generalized (osteo)arthritis: Principal | ICD-10-CM

## 2017-06-27 MED ORDER — TRAMADOL HCL 50 MG PO TABS: ORAL_TABLET | ORAL | 5 refills | Status: DC

## 2017-06-27 NOTE — Telephone Encounter (Signed)
Check [place] registry last filled 03/06/2017.Marland KitchenJohny Wilkinson

## 2017-06-28 NOTE — Telephone Encounter (Signed)
Notified pt MD approved refill has bee sent to pof...Andrea Wilkinson

## 2017-07-02 ENCOUNTER — Other Ambulatory Visit: Payer: Self-pay | Admitting: Internal Medicine

## 2017-07-02 DIAGNOSIS — I2510 Atherosclerotic heart disease of native coronary artery without angina pectoris: Secondary | ICD-10-CM

## 2017-07-02 DIAGNOSIS — E785 Hyperlipidemia, unspecified: Secondary | ICD-10-CM

## 2017-07-11 ENCOUNTER — Other Ambulatory Visit: Payer: Self-pay | Admitting: Internal Medicine

## 2017-07-11 DIAGNOSIS — I10 Essential (primary) hypertension: Secondary | ICD-10-CM

## 2017-07-11 DIAGNOSIS — I2510 Atherosclerotic heart disease of native coronary artery without angina pectoris: Secondary | ICD-10-CM

## 2017-07-21 DIAGNOSIS — M5440 Lumbago with sciatica, unspecified side: Secondary | ICD-10-CM | POA: Insufficient documentation

## 2017-07-21 DIAGNOSIS — M5136 Other intervertebral disc degeneration, lumbar region: Secondary | ICD-10-CM | POA: Diagnosis not present

## 2017-07-21 DIAGNOSIS — M5441 Lumbago with sciatica, right side: Secondary | ICD-10-CM | POA: Diagnosis not present

## 2017-08-14 DIAGNOSIS — M5136 Other intervertebral disc degeneration, lumbar region: Secondary | ICD-10-CM | POA: Diagnosis not present

## 2017-08-31 ENCOUNTER — Telehealth: Payer: Self-pay | Admitting: Internal Medicine

## 2017-08-31 ENCOUNTER — Other Ambulatory Visit: Payer: Self-pay

## 2017-08-31 MED ORDER — FUROSEMIDE 20 MG PO TABS: 20.0000 mg | ORAL_TABLET | Freq: Every day | ORAL | 0 refills | Status: DC

## 2017-08-31 NOTE — Telephone Encounter (Signed)
Copied from [place] [phone]. Topic: Quick Communication - Rx Refill/Question >> Aug 31, 2017 11:43 AM Percell Belt A wrote: Medication: furosemide (LASIX) 20 MG tablet [[id number]]    Has the patient contacted their pharmacy? No    (Agent: If no, request that the patient contact the pharmacy for the refill.)   Preferred Pharmacy (with phone number or street name):[facility] on [address]    Agent: Please be advised that RX refills may take up to 3 business days. We ask that you follow-up with your pharmacy.

## 2017-09-05 MED ORDER — FUROSEMIDE 20 MG PO TABS: 20.0000 mg | ORAL_TABLET | Freq: Every day | ORAL | 1 refills | Status: DC

## 2017-09-05 NOTE — Telephone Encounter (Signed)
Pt states that [facility] [address] stated they did not receive the refill request for furosemide (LASIX). Please resend

## 2017-09-05 NOTE — Telephone Encounter (Signed)
This has been resent

## 2017-09-05 NOTE — Addendum Note (Signed)
Addended by: Karle Barr on: 09/05/2017 04:47 PM   Modules accepted: Orders

## 2017-09-17 ENCOUNTER — Other Ambulatory Visit: Payer: Self-pay | Admitting: Internal Medicine

## 2017-09-17 ENCOUNTER — Other Ambulatory Visit: Payer: Self-pay | Admitting: Interventional Cardiology

## 2017-10-11 ENCOUNTER — Ambulatory Visit (INDEPENDENT_AMBULATORY_CARE_PROVIDER_SITE_OTHER): Payer: Medicare HMO | Admitting: Emergency Medicine

## 2017-10-11 DIAGNOSIS — D518 Other vitamin B12 deficiency anemias: Secondary | ICD-10-CM

## 2017-10-11 MED ORDER — CYANOCOBALAMIN 1000 MCG/ML IJ SOLN
1000.0000 ug | Freq: Once | INTRAMUSCULAR | Status: AC
  Administered 2017-10-11: 1000 ug via INTRAMUSCULAR

## 2017-10-11 NOTE — Progress Notes (Signed)
I have reviewed and agree.

## 2017-11-09 ENCOUNTER — Ambulatory Visit: Payer: Medicare HMO

## 2017-11-19 ENCOUNTER — Other Ambulatory Visit: Payer: Self-pay | Admitting: Internal Medicine

## 2017-11-19 ENCOUNTER — Other Ambulatory Visit: Payer: Self-pay | Admitting: Interventional Cardiology

## 2017-11-19 DIAGNOSIS — K219 Gastro-esophageal reflux disease without esophagitis: Secondary | ICD-10-CM

## 2017-12-03 ENCOUNTER — Other Ambulatory Visit: Payer: Self-pay | Admitting: Internal Medicine

## 2017-12-03 DIAGNOSIS — I10 Essential (primary) hypertension: Secondary | ICD-10-CM

## 2017-12-03 MED ORDER — AMLODIPINE BESYLATE 5 MG PO TABS: 5.0000 mg | ORAL_TABLET | Freq: Every day | ORAL | 1 refills | Status: DC

## 2018-01-22 ENCOUNTER — Other Ambulatory Visit: Payer: Self-pay | Admitting: Internal Medicine

## 2018-01-23 LAB — HM DIABETES EYE EXAM

## 2018-02-13 ENCOUNTER — Other Ambulatory Visit: Payer: Self-pay | Admitting: Internal Medicine

## 2018-02-13 DIAGNOSIS — M479 Spondylosis, unspecified: Secondary | ICD-10-CM

## 2018-02-13 DIAGNOSIS — M159 Polyosteoarthritis, unspecified: Secondary | ICD-10-CM

## 2018-02-13 DIAGNOSIS — M150 Primary generalized (osteo)arthritis: Principal | ICD-10-CM

## 2018-03-04 ENCOUNTER — Other Ambulatory Visit: Payer: Self-pay | Admitting: Internal Medicine

## 2018-03-04 DIAGNOSIS — M159 Polyosteoarthritis, unspecified: Secondary | ICD-10-CM

## 2018-03-04 DIAGNOSIS — M150 Primary generalized (osteo)arthritis: Principal | ICD-10-CM

## 2018-03-04 DIAGNOSIS — M479 Spondylosis, unspecified: Secondary | ICD-10-CM

## 2018-03-21 NOTE — Progress Notes (Deleted)
Subjective:   Andrea Wilkinson is a 84 y.o. female who presents for Medicare Annual (Subsequent) preventive examination.  Review of Systems:  No ROS.  Medicare Wellness Visit. Additional risk factors are reflected in the social history.    Sleep patterns: {SX; SLEEP PATTERNS:18802::"feels rested on waking","does not get up to void","gets up *** times nightly to void","sleeps *** hours nightly"}.    Home Safety/Smoke Alarms: Feels safe in home. Smoke alarms in place.  Living environment; residence and Firearm Safety: {Rehab home environment / accessibility:30080::"no firearms","firearms stored safely"}. Seat Belt Safety/Bike Helmet: Wears seat belt.      Objective:     Vitals: There were no vitals taken for this visit.  There is no height or weight on file to calculate BMI.  Advanced Directives 03/21/2017 03/22/2016 03/22/2016 11/04/2015 08/10/2015 06/11/2015 05/10/2015  Does Patient Have a Medical Advance Directive? No No No Yes No No No  Type of Advance Directive - - Public librarian;Living will - - -  Does patient want to make changes to medical advance directive? - - - No - Patient declined - - -  Copy of [place] in Chart? - - - Yes - - -  Would patient like information on creating a medical advance directive? Yes (ED - Information included in AVS) No - patient declined information No - patient declined information - Yes - Educational materials given No - patient declined information No - patient declined information    Tobacco Social History   Tobacco Use  Smoking Status Never Smoker  Smokeless Tobacco Never Used     Counseling given: Not Answered   Past Medical History:  Diagnosis Date  . Cancer ([place])    basal cell cancer on face  . Cervical spine arthritis   . Depression   . Diabetic retinopathy ([place])   . DJD (degenerative joint disease)   . DM (diabetes mellitus), type 2 ([place])    type 2  . Esophageal stricture   . GERD  (gastroesophageal reflux disease)   . History of hiatal hernia   . Hypertension   . Myocardial infarction [facility])    Past Surgical History:  Procedure Laterality Date  . ABDOMINAL HYSTERECTOMY    . AIR/FLUID EXCHANGE Left 08/10/2015   Procedure: AIR/FLUID EXCHANGE, left eye;  Surgeon: Hayden Pedro, MD;  Location: [place];  Service: Ophthalmology;  Laterality: Left;  . benign tumor removed from left hip    . CARDIAC CATHETERIZATION N/A 03/22/2016   Procedure: Left Heart Cath and Coronary Angiography;  Surgeon: Jettie Booze, MD;  Location: [place] [facility];  Service: Cardiovascular;  Laterality: N/A;  . CATARACT EXTRACTION, BILATERAL  July 2013   Left done on 12/18/11 and right done on 01/15/2012  . CHOLECYSTECTOMY    . CORONARY ARTERY BYPASS GRAFT N/A 03/24/2016   Procedure: CORONARY ARTERY BYPASS GRAFTING (CABG) x 4 utilizing left internal mammary artery and left greater saphenous vein, bilateral saphenous veins harvested endoscopicaly,  SVG to PD, LIMA  to LAD, SEQ SVG to OM1 and OM2;  Surgeon: Grace Isaac, MD;  Location: [place];  Service: Open Heart Surgery;  Laterality: N/A;  . DILATION AND CURETTAGE OF UTERUS    . LASER PHOTO ABLATION Left 08/10/2015   Procedure: LASER PHOTO ABLATION, left eye;  Surgeon: Hayden Pedro, MD;  Location: [place];  Service: Ophthalmology;  Laterality: Left;  . left breast lumpectomy    . MEMBRANE PEEL Left 08/10/2015   Procedure:  MEMBRANE PEEL, left eye;  Surgeon: Hayden Pedro, MD;  Location: [employer];  Service: Ophthalmology;  Laterality: Left;  . PARS PLANA VITRECTOMY Left 08/10/2015   Procedure: PARS PLANA VITRECTOMY WITH 25 GAUGE, headscope laser;  Surgeon: Hayden Pedro, MD;  Location: [place];  Service: Ophthalmology;  Laterality: Left;  . TEE WITHOUT CARDIOVERSION N/A 03/24/2016   Procedure: TRANSESOPHAGEAL ECHOCARDIOGRAM (TEE);  Surgeon: Grace Isaac, MD;  Location: [place];  Service: Open Heart Surgery;  Laterality: N/A;  . TOTAL KNEE  ARTHROPLASTY     left Jan '12; right May '12 - by Dr. Alvan Dame  . TUBAL LIGATION     Family History  Problem Relation Age of Onset  . Diabetes Father    Social History   Socioeconomic History  . Marital status: Widowed    Spouse name: Not on file  . Number of children: 4  . Years of education: Not on file  . Highest education level: Not on file  Occupational History  . Occupation: retired  Scientific laboratory technician  . Financial resource strain: Not on file  . Food insecurity:    Worry: Not on file    Inability: Not on file  . Transportation needs:    Medical: Not on file    Non-medical: Not on file  Tobacco Use  . Smoking status: Never Smoker  . Smokeless tobacco: Never Used  Substance and Sexual Activity  . Alcohol use: No  . Drug use: No  . Sexual activity: Not Currently  Lifestyle  . Physical activity:    Days per week: Not on file    Minutes per session: Not on file  . Stress: Not on file  Relationships  . Social connections:    Talks on phone: Not on file    Gets together: Not on file    Attends religious service: Not on file    Active member of club or organization: Not on file    Attends meetings of clubs or organizations: Not on file    Relationship status: Not on file  Other Topics Concern  . Not on file  Social History Narrative   Married [phone]   5 sons- '56, '57, '59, '62, '70   Grandchildren 10, 6 great grandchildren   Lives in own home, son lives with her. I- ADLs   Discussed end of life care: would not want resuscitation or being maintained with mechanical ventilation.   Daily caffeine use 1 cup of coffee per day    Outpatient Encounter Medications as of 03/22/2018  Medication Sig  . ACCU-CHEK AVIVA PLUS test strip CHECK  SUGAR TWICE DAILY  . ACCU-CHEK SOFTCLIX LANCETS lancets CHECK SUGAR TWICE PER DAY  . amLODipine (NORVASC) 5 MG tablet Take 1 tablet (5 mg total) by mouth daily.  Marland Kitchen aspirin EC 81 MG EC tablet Take 1 tablet (81 mg total) by mouth daily.    Marland Kitchen atorvastatin (LIPITOR) 40 MG tablet TAKE 1 TABLET  DAILY AT 6 PM  . blood glucose meter kit and supplies KIT Use as directed to check blood sugar. DX: E11.9  . Blood Glucose Monitoring Suppl (ACCU-CHEK AVIVA PLUS) W/DEVICE KIT Use to check blood sugars daily  . enalapril (VASOTEC) 20 MG tablet TAKE 1 TABLET EVERY DAY  . furosemide (LASIX) 20 MG tablet TAKE 1 TABLET EVERY DAY  . hyoscyamine (LEVBID) 0.375 MG 12 hr tablet TAKE 1 TABLET (0.375 MG TOTAL) BY MOUTH 2 (TWO) TIMES DAILY.  . meloxicam (MOBIC) 15 MG tablet TAKE 1  TABLET EVERY DAY  . metFORMIN (GLUCOPHAGE) 1000 MG tablet TAKE 1 TABLET TWICE DAILY WITH MEALS  . metoprolol tartrate (LOPRESSOR) 25 MG tablet TAKE 1 AND 1/2  TABLETS (37.5 MG TOTAL) BY MOUTH 2 (TWO) TIMES DAILY.  Marland Kitchen omeprazole (PRILOSEC) 20 MG capsule TAKE 1 CAPSULE EVERY DAY  . oxybutynin (DITROPAN-XL) 5 MG 24 hr tablet Take 1 tablet (5 mg total) by mouth daily. (Patient taking differently: Take 5 mg by mouth daily as needed (for frequency). )  . traMADol (ULTRAM) 50 MG tablet TAKE 1 TABLET EVERY 6 HOURS AS NEEDED  FOR  MODERATE  PAIN  . traMADol (ULTRAM) 50 MG tablet TAKE 1 TABLET BY MOUTH EVERY 6 HOURS AS NEEDED FOR MODERATE PAIN   No facility-administered encounter medications on file as of 03/22/2018.     Activities of Daily Living In your present state of health, do you have any difficulty performing the following activities: 03/21/2017  Hearing? N  Vision? N  Difficulty concentrating or making decisions? Y  Walking or climbing stairs? N  Dressing or bathing? N  Doing errands, shopping? N  Preparing Food and eating ? N  Using the Toilet? N  In the past six months, have you accidently leaked urine? Y  Do you have problems with loss of bowel control? Y  Managing your Medications? N  Managing your Finances? N  Housekeeping or managing your Housekeeping? N  Some recent data might be hidden    Patient Care Team: Janith Lima, MD as PCP - General (Internal  Medicine) Jettie Booze, MD as Consulting Physician (Cardiology)    Assessment:   This is a routine wellness examination for Dorcas. Physical assessment deferred to PCP.   Exercise Activities and Dietary recommendations   Diet (meal preparation, eat out, water intake, caffeinated beverages, dairy products, fruits and vegetables): {Desc; diets:16563}   Goals    . Increase the amount of physical activity     When the weather gets cooler I will walk at least 2 times per week in my neighborhood.        Fall Risk Fall Risk  03/21/2017 11/04/2015 11/04/2015 05/10/2015 05/05/2015  Falls in the past year? No No No No No  Risk for fall due to : Impaired balance/gait - - - -    Depression Screen PHQ 2/9 Scores 03/21/2017 01/03/2017 11/04/2015 11/04/2015  PHQ - 2 Score 2 5 0 0  PHQ- 9 Score 3 9 - -     Cognitive Function MMSE - Mini Mental State Exam 03/21/2017  Orientation to time 5  Orientation to Place 5  Registration 3  Attention/ Calculation 5  Recall 1  Language- name 2 objects 2  Language- repeat 1  Language- follow 3 step command 3  Language- read & follow direction 1  Write a sentence 1  Copy design 1  Total score 28        Immunization History  Administered Date(s) Administered  . Influenza Split 02/26/2012  . Influenza Whole 03/19/1998, 03/19/2009, 04/26/2010  . Influenza, High Dose Seasonal PF 05/01/2013, 05/05/2015, 03/21/2017  . Influenza,inj,Quad PF,6+ Mos 04/07/2014  . Influenza-Unspecified 03/19/2016  . Pneumococcal Conjugate-13 08/17/2014  . Pneumococcal Polysaccharide-23 05/06/2009  . Td 05/06/2009    Screening Tests Health Maintenance  Topic Date Due  . FOOT EXAM  08/29/2017  . INFLUENZA VACCINE  01/17/2018  . TETANUS/TDAP  05/07/2019  . DEXA SCAN  Completed  . PNA vac Low Risk Adult  Completed  . OPHTHALMOLOGY EXAM  Discontinued  Plan:      I have personally reviewed and noted the following in the patient's chart:   . Medical and  social history . Use of alcohol, tobacco or illicit drugs  . Current medications and supplements . Functional ability and status . Nutritional status . Physical activity . Advanced directives . List of other physicians . Vitals . Screenings to include cognitive, depression, and falls . Referrals and appointments  In addition, I have reviewed and discussed with patient certain preventive protocols, quality metrics, and best practice recommendations. A written personalized care plan for preventive services as well as general preventive health recommendations were provided to patient.     Michiel Cowboy, RN  03/21/2018

## 2018-03-22 ENCOUNTER — Ambulatory Visit: Payer: Medicare HMO

## 2018-04-11 ENCOUNTER — Other Ambulatory Visit: Payer: Self-pay | Admitting: Internal Medicine

## 2018-04-12 ENCOUNTER — Other Ambulatory Visit: Payer: Self-pay | Admitting: Internal Medicine

## 2018-04-12 DIAGNOSIS — M159 Polyosteoarthritis, unspecified: Secondary | ICD-10-CM

## 2018-04-12 DIAGNOSIS — M479 Spondylosis, unspecified: Secondary | ICD-10-CM

## 2018-04-12 DIAGNOSIS — M150 Primary generalized (osteo)arthritis: Principal | ICD-10-CM

## 2018-05-01 ENCOUNTER — Encounter: Payer: Self-pay | Admitting: Internal Medicine

## 2018-05-01 ENCOUNTER — Other Ambulatory Visit (INDEPENDENT_AMBULATORY_CARE_PROVIDER_SITE_OTHER): Payer: Medicare HMO

## 2018-05-01 ENCOUNTER — Ambulatory Visit (INDEPENDENT_AMBULATORY_CARE_PROVIDER_SITE_OTHER): Payer: Medicare HMO | Admitting: Internal Medicine

## 2018-05-01 VITALS — BP 128/74 | HR 83 | Temp 98.3°F | Resp 16 | Ht 60.0 in | Wt 154.8 lb

## 2018-05-01 DIAGNOSIS — I255 Ischemic cardiomyopathy: Secondary | ICD-10-CM | POA: Diagnosis not present

## 2018-05-01 DIAGNOSIS — Z0000 Encounter for general adult medical examination without abnormal findings: Secondary | ICD-10-CM

## 2018-05-01 DIAGNOSIS — I10 Essential (primary) hypertension: Secondary | ICD-10-CM | POA: Diagnosis not present

## 2018-05-01 DIAGNOSIS — Z23 Encounter for immunization: Secondary | ICD-10-CM | POA: Diagnosis not present

## 2018-05-01 DIAGNOSIS — N183 Chronic kidney disease, stage 3 unspecified: Secondary | ICD-10-CM

## 2018-05-01 DIAGNOSIS — E11311 Type 2 diabetes mellitus with unspecified diabetic retinopathy with macular edema: Secondary | ICD-10-CM

## 2018-05-01 DIAGNOSIS — E785 Hyperlipidemia, unspecified: Secondary | ICD-10-CM | POA: Diagnosis not present

## 2018-05-01 DIAGNOSIS — Z0001 Encounter for general adult medical examination with abnormal findings: Secondary | ICD-10-CM

## 2018-05-01 DIAGNOSIS — E781 Pure hyperglyceridemia: Secondary | ICD-10-CM

## 2018-05-01 DIAGNOSIS — D508 Other iron deficiency anemias: Secondary | ICD-10-CM | POA: Diagnosis not present

## 2018-05-01 DIAGNOSIS — D518 Other vitamin B12 deficiency anemias: Secondary | ICD-10-CM

## 2018-05-01 DIAGNOSIS — I129 Hypertensive chronic kidney disease with stage 1 through stage 4 chronic kidney disease, or unspecified chronic kidney disease: Secondary | ICD-10-CM | POA: Diagnosis not present

## 2018-05-01 LAB — LDL CHOLESTEROL, DIRECT: Direct LDL: 94 mg/dL

## 2018-05-01 LAB — COMPREHENSIVE METABOLIC PANEL
ALBUMIN: 4.5 g/dL (ref 3.5–5.2)
ALT: 9 U/L (ref 0–35)
AST: 12 U/L (ref 0–37)
Alkaline Phosphatase: 74 U/L (ref 39–117)
BUN: 30 mg/dL — ABNORMAL HIGH (ref 6–23)
CHLORIDE: 100 meq/L (ref 96–112)
CO2: 28 meq/L (ref 19–32)
Calcium: 10.2 mg/dL (ref 8.4–10.5)
Creatinine, Ser: 1.68 mg/dL — ABNORMAL HIGH (ref 0.40–1.20)
GFR: 30.81 mL/min — AB (ref >60.00)
Glucose, Bld: 204 mg/dL — ABNORMAL HIGH (ref 70–99)
Potassium: 4.4 mEq/L (ref 3.5–5.1)
SODIUM: 136 meq/L (ref 135–145)
Total Bilirubin: 0.3 mg/dL (ref 0.2–1.2)
Total Protein: 7.5 g/dL (ref 6.0–8.3)

## 2018-05-01 LAB — URINALYSIS, ROUTINE W REFLEX MICROSCOPIC
Bilirubin Urine: NEGATIVE
Hgb urine dipstick: NEGATIVE
KETONES UR: NEGATIVE
Leukocytes, UA: NEGATIVE
NITRITE: NEGATIVE
RBC / HPF: NONE SEEN (ref 0–?)
SPECIFIC GRAVITY, URINE: 1.02 (ref 1.000–1.030)
TOTAL PROTEIN, URINE-UPE24: NEGATIVE
URINE GLUCOSE: NEGATIVE
UROBILINOGEN UA: 0.2 (ref 0.0–1.0)
pH: 5.5 (ref 5.0–8.0)

## 2018-05-01 LAB — CBC WITH DIFFERENTIAL/PLATELET
BASOS ABS: 0.1 10*3/uL (ref 0.0–0.1)
Basophils Relative: 1.1 % (ref 0.0–3.0)
Eosinophils Absolute: 0.1 10*3/uL (ref 0.0–0.7)
Eosinophils Relative: 1.7 % (ref 0.0–5.0)
HEMATOCRIT: 37.2 % (ref 36.0–46.0)
Hemoglobin: 12.6 g/dL (ref 12.0–15.0)
LYMPHS PCT: 27.9 % (ref 12.0–46.0)
Lymphs Abs: 1.9 10*3/uL (ref 0.7–4.0)
MCHC: 33.9 g/dL (ref 30.0–36.0)
MCV: 91.6 fl (ref 78.0–100.0)
MONOS PCT: 5.9 % (ref 3.0–12.0)
Monocytes Absolute: 0.4 10*3/uL (ref 0.1–1.0)
NEUTROS ABS: 4.4 10*3/uL (ref 1.4–7.7)
Neutrophils Relative %: 63.4 % (ref 43.0–77.0)
PLATELETS: 230 10*3/uL (ref 150.0–400.0)
RBC: 4.06 Mil/uL (ref 3.87–5.11)
RDW: 13.9 % (ref 11.5–15.5)
WBC: 6.9 10*3/uL (ref 4.0–10.5)

## 2018-05-01 LAB — POCT GLYCOSYLATED HEMOGLOBIN (HGB A1C): Hemoglobin A1C: 8.4 % — AB (ref 4.0–5.6)

## 2018-05-01 LAB — MICROALBUMIN / CREATININE URINE RATIO
Creatinine,U: 92.2 mg/dL
MICROALB UR: 1.3 mg/dL (ref 0.0–1.9)
MICROALB/CREAT RATIO: 1.4 mg/g (ref 0.0–30.0)

## 2018-05-01 LAB — LIPID PANEL
CHOLESTEROL: 195 mg/dL (ref 0–200)
HDL: 44.2 mg/dL (ref >39.00)
Total CHOL/HDL Ratio: 4
Triglycerides: 459 mg/dL — ABNORMAL HIGH (ref 0.0–149.0)

## 2018-05-01 LAB — POCT GLUCOSE (DEVICE FOR HOME USE): Glucose Fasting, POC: 234 mg/dL — AB (ref 70–99)

## 2018-05-01 LAB — FERRITIN: Ferritin: 31.3 ng/mL (ref 10.0–291.0)

## 2018-05-01 LAB — IBC PANEL
Iron: 69 ug/dL (ref 42–145)
Saturation Ratios: 18.9 % — ABNORMAL LOW (ref 20.0–50.0)
TRANSFERRIN: 261 mg/dL (ref 212.0–360.0)

## 2018-05-01 LAB — TSH: TSH: 0.71 u[IU]/mL (ref 0.35–4.50)

## 2018-05-01 LAB — FOLATE: Folate: >23.9 ng/mL (ref >5.9)

## 2018-05-01 MED ORDER — CYANOCOBALAMIN 1000 MCG/ML IJ SOLN
1000.0000 ug | Freq: Once | INTRAMUSCULAR | Status: AC
  Administered 2018-05-01: 1000 ug via INTRAMUSCULAR

## 2018-05-01 MED ORDER — ICOSAPENT ETHYL 1 G PO CAPS: 2.0000 | ORAL_CAPSULE | Freq: Two times a day (BID) | ORAL | 1 refills | Status: DC

## 2018-05-01 MED ORDER — ATORVASTATIN CALCIUM 20 MG PO TABS: 20.0000 mg | ORAL_TABLET | Freq: Every day | ORAL | 1 refills | Status: DC

## 2018-05-01 NOTE — Patient Instructions (Signed)

## 2018-05-01 NOTE — Progress Notes (Signed)
Subjective:  Patient ID: Andrea Wilkinson, female    DOB: [birth year]  Age: 84 y.o. MRN: [id number]  CC: Diabetes and Annual Exam   HPI Andrea Wilkinson presents for f/up -I have not seen her in over a year.  She complains that her blood sugars are not very well controlled.  She cannot tell me whether or not she is taking metformin but based on her prescription refills she would have run out of it at least 6 months ago.  She denies polys.  Past Medical History:  Diagnosis Date  . Cancer ([place])    basal cell cancer on face  . Cervical spine arthritis   . Depression   . Diabetic retinopathy ([place])   . DJD (degenerative joint disease)   . DM (diabetes mellitus), type 2 ([place])    type 2  . Esophageal stricture   . GERD (gastroesophageal reflux disease)   . History of hiatal hernia   . Hypertension   . Myocardial infarction [facility])    Past Surgical History:  Procedure Laterality Date  . ABDOMINAL HYSTERECTOMY    . AIR/FLUID EXCHANGE Left 08/10/2015   Procedure: AIR/FLUID EXCHANGE, left eye;  Surgeon: Hayden Pedro, MD;  Location: [place];  Service: Ophthalmology;  Laterality: Left;  . benign tumor removed from left hip    . CARDIAC CATHETERIZATION N/A 03/22/2016   Procedure: Left Heart Cath and Coronary Angiography;  Surgeon: Jettie Booze, MD;  Location: [place] [facility];  Service: Cardiovascular;  Laterality: N/A;  . CATARACT EXTRACTION, BILATERAL  July 2013   Left done on 12/18/11 and right done on 01/15/2012  . CHOLECYSTECTOMY    . CORONARY ARTERY BYPASS GRAFT N/A 03/24/2016   Procedure: CORONARY ARTERY BYPASS GRAFTING (CABG) x 4 utilizing left internal mammary artery and left greater saphenous vein, bilateral saphenous veins harvested endoscopicaly,  SVG to PD, LIMA  to LAD, SEQ SVG to OM1 and OM2;  Surgeon: Grace Isaac, MD;  Location: [place];  Service: Open Heart Surgery;  Laterality: N/A;  . DILATION AND CURETTAGE OF UTERUS    . LASER PHOTO ABLATION Left 08/10/2015   Procedure:  LASER PHOTO ABLATION, left eye;  Surgeon: Hayden Pedro, MD;  Location: [facility];  Service: Ophthalmology;  Laterality: Left;  . left breast lumpectomy    . MEMBRANE PEEL Left 08/10/2015   Procedure: MEMBRANE PEEL, left eye;  Surgeon: Hayden Pedro, MD;  Location: [place];  Service: Ophthalmology;  Laterality: Left;  . PARS PLANA VITRECTOMY Left 08/10/2015   Procedure: PARS PLANA VITRECTOMY WITH 25 GAUGE, headscope laser;  Surgeon: Hayden Pedro, MD;  Location: [place];  Service: Ophthalmology;  Laterality: Left;  . TEE WITHOUT CARDIOVERSION N/A 03/24/2016   Procedure: TRANSESOPHAGEAL ECHOCARDIOGRAM (TEE);  Surgeon: Grace Isaac, MD;  Location: [place];  Service: Open Heart Surgery;  Laterality: N/A;  . TOTAL KNEE ARTHROPLASTY     left Jan '12; right May '12 - by Dr. Alvan Dame  . TUBAL LIGATION      reports that she has never smoked. She has never used smokeless tobacco. She reports that she does not drink alcohol or use drugs. family history includes Diabetes in her father. Allergies  Allergen Reactions  . Codeine Nausea And Vomiting    Outpatient Medications Prior to Visit  Medication Sig Dispense Refill  . ACCU-CHEK AVIVA PLUS test strip CHECK  SUGAR TWICE DAILY 200 each 3  . ACCU-CHEK SOFTCLIX LANCETS lancets CHECK SUGAR TWICE PER DAY 200  each 3  . aspirin EC 81 MG EC tablet Take 1 tablet (81 mg total) by mouth daily.    . Blood Glucose Monitoring Suppl (ACCU-CHEK AVIVA PLUS) W/DEVICE KIT Use to check blood sugars daily 1 kit 0  . furosemide (LASIX) 20 MG tablet TAKE 1 TABLET EVERY DAY 90 tablet 1  . metoprolol tartrate (LOPRESSOR) 25 MG tablet TAKE 1 AND 1/2  TABLETS (37.5 MG TOTAL) BY MOUTH 2 (TWO) TIMES DAILY. 270 tablet 1  . omeprazole (PRILOSEC) 20 MG capsule TAKE 1 CAPSULE EVERY DAY 90 capsule 3  . traMADol (ULTRAM) 50 MG tablet TAKE 1 TABLET BY MOUTH EVERY 6 HOURS AS NEEDED FOR MODERATE PAIN 120 tablet 5  . metFORMIN (GLUCOPHAGE) 1000 MG tablet TAKE 1 TABLET TWICE DAILY WITH MEALS  180 tablet 1  . amLODipine (NORVASC) 5 MG tablet Take 1 tablet (5 mg total) by mouth daily. (Patient not taking: Reported on 05/01/2018) 90 tablet 1  . atorvastatin (LIPITOR) 40 MG tablet TAKE 1 TABLET  DAILY AT 6 PM (Patient not taking: Reported on 05/01/2018) 90 tablet 1  . blood glucose meter kit and supplies KIT Use as directed to check blood sugar. DX: E11.9 1 each 0  . enalapril (VASOTEC) 20 MG tablet TAKE 1 TABLET EVERY DAY 90 tablet 1  . hyoscyamine (LEVBID) 0.375 MG 12 hr tablet TAKE 1 TABLET (0.375 MG TOTAL) BY MOUTH 2 (TWO) TIMES DAILY. 180 tablet 1  . meloxicam (MOBIC) 15 MG tablet TAKE 1 TABLET EVERY DAY 90 tablet 1  . oxybutynin (DITROPAN-XL) 5 MG 24 hr tablet Take 1 tablet (5 mg total) by mouth daily. (Patient taking differently: Take 5 mg by mouth daily as needed (for frequency). ) 90 tablet 3  . traMADol (ULTRAM) 50 MG tablet TAKE 1 TABLET EVERY 6 HOURS AS NEEDED  FOR  MODERATE  PAIN 120 tablet 5  . traMADol (ULTRAM) 50 MG tablet TAKE 1 TABLET BY MOUTH EVERY 6 HOURS AS NEEDED FOR MODERATE PAIN 75 tablet 5   No facility-administered medications prior to visit.     ROS Review of Systems  Constitutional: Negative.  Negative for chills, diaphoresis, fatigue and fever.  HENT: Negative.   Eyes: Negative for visual disturbance.  Respiratory: Negative for cough, chest tightness, shortness of breath and wheezing.   Cardiovascular: Negative for chest pain, palpitations and leg swelling.  Gastrointestinal: Negative for abdominal pain, constipation, diarrhea, nausea and vomiting.  Endocrine: Negative for polydipsia, polyphagia and polyuria.  Genitourinary: Negative.  Negative for difficulty urinating.  Musculoskeletal: Negative.  Negative for arthralgias and myalgias.  Skin: Negative for color change.  Neurological: Negative.  Negative for light-headedness and headaches.  Hematological: Negative for adenopathy. Does not bruise/bleed easily.  Psychiatric/Behavioral: Positive for  confusion, decreased concentration and dysphoric mood. Negative for sleep disturbance and suicidal ideas. The patient is not nervous/anxious.     Objective:  BP 128/74 (BP Location: Left Arm, Patient Position: Sitting, Cuff Size: Normal)   Pulse 83   Temp 98.3 F (36.8 C) (Oral)   Resp 16   Ht 5' (1.524 m)   Wt 154 lb 12 oz (70.2 kg)   SpO2 97%   BMI 30.22 kg/m   BP Readings from Last 3 Encounters:  05/01/18 128/74  06/07/17 130/70  03/21/17 128/72    Wt Readings from Last 3 Encounters:  05/01/18 154 lb 12 oz (70.2 kg)  06/07/17 154 lb 4 oz (70 kg)  03/21/17 147 lb (66.7 kg)    Physical Exam  Constitutional:  She is oriented to person, place, and time. No distress.  HENT:  Mouth/Throat: Oropharynx is clear and moist. No oropharyngeal exudate.  Eyes: Conjunctivae are normal. No scleral icterus.  Neck: Normal range of motion. Neck supple. No JVD present. No thyromegaly present.  Cardiovascular: Normal rate, regular rhythm and normal heart sounds. Exam reveals no gallop.  No murmur heard. Pulmonary/Chest: Effort normal and breath sounds normal. No respiratory distress. She has no wheezes. She has no rales.  Abdominal: Soft. Normal appearance and bowel sounds are normal. She exhibits no mass. There is no hepatosplenomegaly. There is no tenderness.  Musculoskeletal: Normal range of motion. She exhibits no edema, tenderness or deformity.  Lymphadenopathy:    She has no cervical adenopathy.  Neurological: She is alert and oriented to person, place, and time.  Skin: Skin is warm and dry. She is not diaphoretic. No pallor.  Psychiatric: She has a normal mood and affect. Thought content normal. Her speech is delayed. She is slowed. She is not agitated and not withdrawn. Thought content is not paranoid. Cognition and memory are impaired. She expresses no homicidal and no suicidal ideation. She is communicative. She exhibits abnormal recent memory and abnormal remote memory.  Vitals  reviewed.   Lab Results  Component Value Date   WBC 6.9 05/01/2018   HGB 12.6 05/01/2018   HCT 37.2 05/01/2018   PLT 230.0 05/01/2018   GLUCOSE 204 (H) 05/01/2018   CHOL 195 05/01/2018   TRIG (H) 05/01/2018    459.0 Triglyceride is over 400; calculations on Lipids are invalid.   HDL 44.20 05/01/2018   LDLDIRECT 94.0 05/01/2018   LDLCALC 92 08/29/2016   ALT 9 05/01/2018   AST 12 05/01/2018   NA 136 05/01/2018   K 4.4 05/01/2018   CL 100 05/01/2018   CREATININE 1.68 (H) 05/01/2018   BUN 30 (H) 05/01/2018   CO2 28 05/01/2018   TSH 0.71 05/01/2018   INR 1.49 03/24/2016   HGBA1C 8.4 (A) 05/01/2018   MICROALBUR 1.3 05/01/2018    Dg Chest 2 View  Result Date: 04/27/2016 CLINICAL DATA:  History CABG 03/24/2016. Fatigue, shortness of breath EXAM: CHEST  2 VIEW COMPARISON:  03/29/2016 FINDINGS: There is no focal parenchymal opacity. There is no pleural effusion or pneumothorax. The heart and mediastinal contours are unremarkable. There is evidence of prior CABG. The osseous structures are unremarkable. IMPRESSION: No active cardiopulmonary disease. Electronically Signed   By: Kathreen Devoid   On: 04/27/2016 14:06    Assessment & Plan:   Willetta was seen today for diabetes and annual exam.  Diagnoses and all orders for this visit:  Essential hypertension- Her blood pressure is adequately well controlled.  Electrolytes and renal function are normal.  I decided not to start any antihypertensives pending consultations with cardiology and nephrology. -     Comprehensive metabolic panel; Future -     TSH; Future  Cardiomyopathy, ischemic- She is due for cardiology follow-up. -     Ambulatory referral to Cardiology  Diabetic retinopathy of left eye with macular edema associated with type 2 diabetes mellitus, unspecified retinopathy severity ([place])- Her A1c is up to 8.4%.  Her blood sugars are not adequately well controlled.  Her GFR is too low for her to continue metformin.  I do not think  she has much pancreatic beta cell reserve either.  I think the best approach is for her to start using a basal insulin.  I am awaiting her decision to agree to this. -  Comprehensive metabolic panel; Future -     Microalbumin / creatinine urine ratio; Future -     POCT glycosylated hemoglobin (Hb A1C) -     POCT Glucose (Device for Home Use)  CKD (chronic kidney disease) stage 3, GFR 30-59 ml/min (HCC)- Her creatinine clearance is slightly less than 30.  I have asked her to see nephrology for evaluation and treatment options. -     Comprehensive metabolic panel; Future -     Urinalysis, Routine w reflex microscopic; Future -     Ambulatory referral to Nephrology  Iron deficiency anemia secondary to inadequate dietary iron intake -Improvement noted. -     CBC with Differential/Platelet; Future -     IBC panel; Future -     Ferritin; Future  Hyperlipidemia LDL goal <70- She has not achieved her LDL goal.  Will restart the statin. -     Lipid panel; Future -     atorvastatin (LIPITOR) 20 MG tablet; Take 1 tablet (20 mg total) by mouth daily.  Dietary vitamin B12 deficiency anemia- Will restart parenteral vitamin B12 replacement therapy. -     CBC with Differential/Platelet; Future -     Folate; Future -     cyanocobalamin ((VITAMIN B-12)) injection 1,000 mcg  Need for influenza vaccination -     Flu vaccine HIGH DOSE PF (Fluzone High dose)  Need for pneumococcal vaccination -     Pneumococcal polysaccharide vaccine 23-valent greater than or equal to 2yo subcutaneous/IM  Hypertriglyceridemia, essential- Will start VASCEPA for CV risk reduction as well as to prevent complications such as pancreatitis. -     Icosapent Ethyl (VASCEPA) 1 g CAPS; Take 2 capsules (2 g total) by mouth 2 (two) times daily.  Routine general medical examination at a health care facility   I have discontinued Adah F. Prouse "Faye"'s oxybutynin, blood glucose meter kit and supplies, metFORMIN, hyoscyamine,  enalapril, atorvastatin, and meloxicam. I am also having her start on Icosapent Ethyl and atorvastatin. Additionally, I am having her maintain her ACCU-CHEK AVIVA PLUS, aspirin, ACCU-CHEK SOFTCLIX LANCETS, ACCU-CHEK AVIVA PLUS, metoprolol tartrate, omeprazole, amLODipine, furosemide, and traMADol. We administered cyanocobalamin.  Meds ordered this encounter  Medications  . cyanocobalamin ((VITAMIN B-12)) injection 1,000 mcg  . Icosapent Ethyl (VASCEPA) 1 g CAPS    Sig: Take 2 capsules (2 g total) by mouth 2 (two) times daily.    Dispense:  360 capsule    Refill:  1  . atorvastatin (LIPITOR) 20 MG tablet    Sig: Take 1 tablet (20 mg total) by mouth daily.    Dispense:  90 tablet    Refill:  1   See AVS for instructions about healthy living and anticipatory guidance.  Follow-up: Return in about 3 months (around 08/01/2018).  Scarlette Calico, MD

## 2018-05-02 NOTE — Assessment & Plan Note (Signed)

## 2018-05-03 ENCOUNTER — Other Ambulatory Visit: Payer: Self-pay | Admitting: Internal Medicine

## 2018-05-03 DIAGNOSIS — E781 Pure hyperglyceridemia: Secondary | ICD-10-CM

## 2018-05-03 DIAGNOSIS — E11311 Type 2 diabetes mellitus with unspecified diabetic retinopathy with macular edema: Secondary | ICD-10-CM

## 2018-05-03 MED ORDER — INSULIN DEGLUDEC 200 UNIT/ML ~~LOC~~ SOPN: 20.0000 [IU] | PEN_INJECTOR | Freq: Every day | SUBCUTANEOUS | 1 refills | Status: AC

## 2018-05-07 ENCOUNTER — Ambulatory Visit: Payer: Medicare HMO | Admitting: *Deleted

## 2018-05-07 DIAGNOSIS — Z794 Long term (current) use of insulin: Principal | ICD-10-CM

## 2018-05-07 DIAGNOSIS — E118 Type 2 diabetes mellitus with unspecified complications: Secondary | ICD-10-CM

## 2018-05-07 NOTE — Progress Notes (Signed)
Pt here for nurse visit for insulin education and CBG monitoring. Patient was instructed on how to use insulin pen and injected medication herself. Tresiba 20 units given sq in lower abdomen.  She also checked her CBG which was 141 at time of visit. I instructed patient and care taker to keep a record of CBGs and let us know how she does with staring Tresiba 20 units daily.   Patient given 2 sample pens and printed Rx for [place]. See meds.

## 2018-05-08 ENCOUNTER — Other Ambulatory Visit: Payer: Self-pay | Admitting: *Deleted

## 2018-05-08 MED ORDER — BAYER MICROLET LANCETS MISC: 1.0000 | Freq: Two times a day (BID) | 12 refills | Status: DC

## 2018-05-08 MED ORDER — GLUCOSE BLOOD VI STRP: 1.0000 | ORAL_STRIP | Freq: Two times a day (BID) | 12 refills | Status: DC

## 2018-05-08 NOTE — Telephone Encounter (Signed)
Patient requests testing supplies be sent to [facility] order [facility] for sample Contour next EX meter sample meter.

## 2018-05-11 DIAGNOSIS — M5416 Radiculopathy, lumbar region: Secondary | ICD-10-CM | POA: Diagnosis not present

## 2018-05-13 ENCOUNTER — Telehealth: Payer: Self-pay

## 2018-05-13 DIAGNOSIS — E118 Type 2 diabetes mellitus with unspecified complications: Secondary | ICD-10-CM

## 2018-05-13 DIAGNOSIS — Z794 Long term (current) use of insulin: Principal | ICD-10-CM

## 2018-05-13 MED ORDER — GLUCOSE BLOOD VI STRP: 1.0000 | ORAL_STRIP | Freq: Two times a day (BID) | 3 refills | Status: AC

## 2018-05-13 MED ORDER — ACCU-CHEK SOFT TOUCH LANCETS MISC: 12 refills | Status: AC

## 2018-05-13 MED ORDER — BLOOD GLUCOSE MONITOR KIT: PACK | 0 refills | Status: DC

## 2018-05-13 NOTE — Telephone Encounter (Signed)
Accucheck Aviva Plus Meter, Test strips, Soft Clix Lancets, control solution and alcohol wipes.   [facility] and called in the above rx. Medication list has been cleaned up.

## 2018-05-20 ENCOUNTER — Encounter: Payer: Self-pay | Admitting: Physician Assistant

## 2018-05-20 NOTE — Progress Notes (Signed)
Cardiology Office Note    Date:  05/21/2018  ID:  Andrea Wilkinson, DOB 1931-10-30, MRN [id number] PCP:  Janith Lima, MD  Cardiologist:  Larae Grooms, MD   Chief Complaint: f/u CAD (last seen 11/2016)  History of Present Illness:  Andrea Wilkinson is a 84 y.o. female with history of CAD (NSTEMI 03/2016 s/p CABGx4), post-op atrial fib, HTN, HLD, DM, CKD stage III who presents for overdue follow-up. To recap, at time of NSTEMI 03/2016, cath revaled multivessel CAD. Echo showed distal septal and apical akinesis, EF 50-55%, mild AI/MR.She ultimately required coronary artery bypass grafting x 4 utilizing LIMA to LAD, Sequential SVG to OM 1 and OM2, and SVG to PDA. Postoperative course was complicated by atrial fibrillation requiring temporary amiodarone, in NSR at discharge. Pre-op studies showed 1-39% BICA, moderate disease by R ABI. Last labs 04/2018 LDL 94, trig 459, Hgb 12.6, TSH wnl, K 4.4, Cr 1.68, LFTs OK, A1C 8.4 -> started on Tresiba and fish oil (Vascepa). She was also referred to nephrology to establish care.  She returns for overdue follow-up with her granddaughter Luiz Blare whose number is primary in her chart. Leah relays that the patient lives alone and they recently uncovered that she has some dementia and was not taking any medications whatsoever reliably. She saw PCP recently to re-initiate therapy. From a cardiac standpoint she reports stable chronic DOE but feels this is her baseline. She states in general she feels "wonderful." No chest pain or palpitations. She's starting B12 injections.   Past Medical History:  Diagnosis Date  . CAD (coronary artery disease)    a. s/p CABG 03/2016.  Marland Kitchen Cancer ([place])    basal cell cancer on face  . Cervical spine arthritis   . CKD (chronic kidney disease), stage III ([place])   . Depression   . Diabetic retinopathy ([place])   . DJD (degenerative joint disease)   . DM (diabetes mellitus), type 2 ([place])    type 2  . Esophageal stricture   .  GERD (gastroesophageal reflux disease)   . History of hiatal hernia   . Hypertension   . Mild aortic insufficiency   . Mild mitral regurgitation   . Myocardial infarction ([place])   . Obesity   . Postoperative atrial fibrillation ([place]) 2017  . PVD (peripheral vascular disease) ([place])    a. mod disease by R ABI, 1-39% BICA.    Past Surgical History:  Procedure Laterality Date  . ABDOMINAL HYSTERECTOMY    . AIR/FLUID EXCHANGE Left 08/10/2015   Procedure: AIR/FLUID EXCHANGE, left eye;  Surgeon: Hayden Pedro, MD;  Location: [place];  Service: Ophthalmology;  Laterality: Left;  . benign tumor removed from left hip    . CARDIAC CATHETERIZATION N/A 03/22/2016   Procedure: Left Heart Cath and Coronary Angiography;  Surgeon: Jettie Booze, MD;  Location: [facility];  Service: Cardiovascular;  Laterality: N/A;  . CATARACT EXTRACTION, BILATERAL  July 2013   Left done on 12/18/11 and right done on 01/15/2012  . CHOLECYSTECTOMY    . CORONARY ARTERY BYPASS GRAFT N/A 03/24/2016   Procedure: CORONARY ARTERY BYPASS GRAFTING (CABG) x 4 utilizing left internal mammary artery and left greater saphenous vein, bilateral saphenous veins harvested endoscopicaly,  SVG to PD, LIMA  to LAD, SEQ SVG to OM1 and OM2;  Surgeon: Grace Isaac, MD;  Location: [facility];  Service: Open Heart Surgery;  Laterality: N/A;  . DILATION AND CURETTAGE OF UTERUS    .  LASER PHOTO ABLATION Left 08/10/2015   Procedure: LASER PHOTO ABLATION, left eye;  Surgeon: Hayden Pedro, MD;  Location: [facility];  Service: Ophthalmology;  Laterality: Left;  . left breast lumpectomy    . MEMBRANE PEEL Left 08/10/2015   Procedure: MEMBRANE PEEL, left eye;  Surgeon: Hayden Pedro, MD;  Location: [facility];  Service: Ophthalmology;  Laterality: Left;  . PARS PLANA VITRECTOMY Left 08/10/2015   Procedure: PARS PLANA VITRECTOMY WITH 25 GAUGE, headscope laser;  Surgeon: Hayden Pedro, MD;  Location: [facility];  Service: Ophthalmology;  Laterality: Left;    . TEE WITHOUT CARDIOVERSION N/A 03/24/2016   Procedure: TRANSESOPHAGEAL ECHOCARDIOGRAM (TEE);  Surgeon: Grace Isaac, MD;  Location: [facility];  Service: Open Heart Surgery;  Laterality: N/A;  . TOTAL KNEE ARTHROPLASTY     left Jan '12; right May '12 - by Dr. Alvan Dame  . TUBAL LIGATION      Current Medications: Current Meds  Medication Sig  . Alcohol Swabs (B-D SINGLE USE SWABS REGULAR) PADS   . aspirin EC 81 MG EC tablet Take 1 tablet (81 mg total) by mouth daily.  . Blood Glucose Calibration (ACCU-CHEK AVIVA) SOLN   . Blood Glucose Monitoring Suppl (ACCU-CHEK AVIVA PLUS) W/DEVICE KIT Use to check blood sugars daily  . glucose blood test strip 1 each by Other route 2 (two) times daily. DX: E11.8  . Icosapent Ethyl 1 g CAPS Take 2 capsules by mouth 2 (two) times daily.  . Insulin Degludec (TRESIBA FLEXTOUCH) 200 UNIT/ML SOPN Inject 20 Units into the skin daily.  . Lancets (ACCU-CHEK SOFT TOUCH) lancets Use to check blood sugar bid. DX: E11.8  . Omega-3 Fatty Acids (EQL FISH OIL PO) Take 1 tablet by mouth daily.  Marland Kitchen omeprazole (PRILOSEC) 20 MG capsule TAKE 1 CAPSULE EVERY DAY  . traMADol (ULTRAM) 50 MG tablet TAKE 1 TABLET BY MOUTH EVERY 6 HOURS AS NEEDED FOR MODERATE PAIN    Allergies:   Codeine   Social History   Socioeconomic History  . Marital status: Widowed    Spouse name: Not on file  . Number of children: 4  . Years of education: Not on file  . Highest education level: Not on file  Occupational History  . Occupation: retired  Scientific laboratory technician  . Financial resource strain: Not on file  . Food insecurity:    Worry: Not on file    Inability: Not on file  . Transportation needs:    Medical: Not on file    Non-medical: Not on file  Tobacco Use  . Smoking status: Never Smoker  . Smokeless tobacco: Never Used  Substance and Sexual Activity  . Alcohol use: No  . Drug use: No  . Sexual activity: Not Currently  Lifestyle  . Physical activity:    Days per week: Not on file     Minutes per session: Not on file  . Stress: Not on file  Relationships  . Social connections:    Talks on phone: Not on file    Gets together: Not on file    Attends religious service: Not on file    Active member of club or organization: Not on file    Attends meetings of clubs or organizations: Not on file    Relationship status: Not on file  Other Topics Concern  . Not on file  Social History Narrative   Married [phone]   5 sons- '56, '57, '59, '62, '70   Grandchildren 10, 6 great grandchildren  Lives in own home, son lives with her. I- ADLs   Discussed end of life care: would not want resuscitation or being maintained with mechanical ventilation.   Daily caffeine use 1 cup of coffee per day     Family History:  The patient's family history includes Diabetes in her father.  ROS:   Please see the history of present illness. All other systems are reviewed and otherwise negative.    PHYSICAL EXAM:   VS:  BP 110/68   Pulse 76   Ht 5' (1.524 m)   Wt 155 lb 1.9 oz (70.4 kg)   SpO2 97%   BMI 30.29 kg/m   BMI: Body mass index is 30.29 kg/m. GEN: Well nourished, well developed WF, in no acute distress HEENT: normocephalic, atraumatic Neck: no JVD, carotid bruits, or masses Cardiac: RRR; no murmurs, rubs, or gallops, no edema  Respiratory:  clear to auscultation bilaterally, normal work of breathing GI: soft, nontender, nondistended, + BS MS: no deformity or atrophy Skin: warm and dry, no rash Neuro:  Alert and Oriented x 3, Strength and sensation are intact, follows commands Psych: euthymic mood, full affect  Wt Readings from Last 3 Encounters:  05/21/18 155 lb 1.9 oz (70.4 kg)  05/01/18 154 lb 12 oz (70.2 kg)  06/07/17 154 lb 4 oz (70 kg)      Studies/Labs Reviewed:   EKG:  EKG was ordered today and personally reviewed by me and demonstrates NSR 76bpm, nonspecific ST-T changes. Improved from prior EKGs which was obtained around time of MI admission.  Recent  Labs: 05/01/2018: ALT 9; BUN 30; Creatinine, Ser 1.68; Hemoglobin 12.6; Platelets 230.0; Potassium 4.4; Sodium 136; TSH 0.71   Lipid Panel    Component Value Date/Time   CHOL 195 05/01/2018 1226   TRIG (H) 05/01/2018 1226    459.0 Triglyceride is over 400; calculations on Lipids are invalid.   HDL 44.20 05/01/2018 1226   CHOLHDL 4 05/01/2018 1226   VLDL 35.6 08/29/2016 1050   LDLCALC 92 08/29/2016 1050   LDLDIRECT 94.0 05/01/2018 1226    Additional studies/ records that were reviewed today include: Summarized above.   ASSESSMENT & PLAN:   1. CAD s/p CABG - stable symptomatically. Continue ASA. Pt self-discontinued metoprolol and amlodipine. Her BP is now running on the lower side so will keep off for now. See below for statin. 2. Postop AF - quiescent. 3. HTN - controlled. She self-discontinued amlodipine and metoprolol but BP is normal off of these so will remain off. 4. Hyperlipidemia - she has had recent memory issues. I doubt these are from statin since she self-discontinued all of her meds. Will re-trial atorvastatin 2m daily. Check LFTs/lipids in 6 weeks. If she tolerates this without change in memory, will plan to titrate further. Family now assists with meds. 5. PVD - noted on pre-op testing. Currently asymptomatic. Continue RF modification.  Disposition: F/u with Dr. VIrish Lackin 6 months.   Medication Adjustments/Labs and Tests Ordered: Current medicines are reviewed at length with the patient today.  Concerns regarding medicines are outlined above. Medication changes, Labs and Tests ordered today are summarized above and listed in the Patient Instructions accessible in Encounters.   Signed, DCharlie Pitter PA-C  05/21/2018 10:08 AM    [facility] [address] [place]  [id number]: [phone] Fax: [phone]

## 2018-05-21 ENCOUNTER — Encounter: Payer: Self-pay | Admitting: Physician Assistant

## 2018-05-21 ENCOUNTER — Ambulatory Visit: Payer: Medicare HMO | Admitting: Physician Assistant

## 2018-05-21 VITALS — BP 110/68 | HR 76 | Ht 60.0 in | Wt 155.1 lb

## 2018-05-21 DIAGNOSIS — I9789 Other postprocedural complications and disorders of the circulatory system, not elsewhere classified: Secondary | ICD-10-CM | POA: Diagnosis not present

## 2018-05-21 DIAGNOSIS — I4891 Unspecified atrial fibrillation: Secondary | ICD-10-CM

## 2018-05-21 DIAGNOSIS — I10 Essential (primary) hypertension: Secondary | ICD-10-CM

## 2018-05-21 DIAGNOSIS — Z951 Presence of aortocoronary bypass graft: Secondary | ICD-10-CM

## 2018-05-21 DIAGNOSIS — I739 Peripheral vascular disease, unspecified: Secondary | ICD-10-CM | POA: Diagnosis not present

## 2018-05-21 DIAGNOSIS — E785 Hyperlipidemia, unspecified: Secondary | ICD-10-CM | POA: Diagnosis not present

## 2018-05-21 DIAGNOSIS — I2510 Atherosclerotic heart disease of native coronary artery without angina pectoris: Secondary | ICD-10-CM | POA: Diagnosis not present

## 2018-05-21 MED ORDER — ATORVASTATIN CALCIUM 10 MG PO TABS: 10.0000 mg | ORAL_TABLET | Freq: Every day | ORAL | 3 refills | Status: AC

## 2018-05-21 NOTE — Patient Instructions (Addendum)
Medication Instructions:  Your physician has recommended you make the following change in your medication:  1.  START Atorvastatin 10 mg daily .  If you need a refill on your cardiac medications before your next appointment, please call your pharmacy.   Lab work: 07/03/18:  FASTING LIPID / LFT.  COME TO THE OFFICE ANYTIME THAT MORNING, MAKE SURE NOT TO EAT OR DRINK ANYTHING BEFORE   If you have labs (blood work) drawn today and your tests are completely normal, you will receive your results only by: Marland Kitchen MyChart Message (if you have MyChart) OR . A paper copy in the mail If you have any lab test that is abnormal or we need to change your treatment, we will call you to review the results.  Testing/Procedures: None ordered  Follow-Up: At [facility], you and your health needs are our priority.  As part of our continuing mission to provide you with exceptional heart care, we have created designated Provider Care Teams.  These Care Teams include your primary Cardiologist (physician) and Advanced Practice Providers (APPs -  Physician Assistants and Nurse Practitioners) who all work together to provide you with the care you need, when you need it. You will need a follow up appointment in 6 months.  Please call our office 2 months in advance to schedule this appointment.  You may see Larae Grooms, MD or one of the following Advanced Practice Providers on your designated Care Team:   [place], PA-C Melina Copa, PA-C . Ermalinda Barrios, PA-C  Any Other Special Instructions Will Be Listed Below (If Applicable).

## 2018-06-03 ENCOUNTER — Ambulatory Visit (INDEPENDENT_AMBULATORY_CARE_PROVIDER_SITE_OTHER): Payer: Medicare HMO

## 2018-06-03 DIAGNOSIS — D518 Other vitamin B12 deficiency anemias: Secondary | ICD-10-CM | POA: Diagnosis not present

## 2018-06-03 MED ORDER — CYANOCOBALAMIN 1000 MCG/ML IJ SOLN
1000.0000 ug | Freq: Once | INTRAMUSCULAR | Status: AC
  Administered 2018-06-03: 1000 ug via INTRAMUSCULAR

## 2018-06-03 NOTE — Progress Notes (Signed)
I have reviewed and agree.

## 2018-07-03 ENCOUNTER — Other Ambulatory Visit (INDEPENDENT_AMBULATORY_CARE_PROVIDER_SITE_OTHER): Payer: Medicare HMO

## 2018-07-03 ENCOUNTER — Other Ambulatory Visit: Payer: Medicare HMO | Admitting: *Deleted

## 2018-07-03 ENCOUNTER — Ambulatory Visit (INDEPENDENT_AMBULATORY_CARE_PROVIDER_SITE_OTHER): Payer: Medicare HMO

## 2018-07-03 DIAGNOSIS — I4891 Unspecified atrial fibrillation: Secondary | ICD-10-CM | POA: Diagnosis not present

## 2018-07-03 DIAGNOSIS — Z951 Presence of aortocoronary bypass graft: Secondary | ICD-10-CM

## 2018-07-03 DIAGNOSIS — I10 Essential (primary) hypertension: Secondary | ICD-10-CM

## 2018-07-03 DIAGNOSIS — I9789 Other postprocedural complications and disorders of the circulatory system, not elsewhere classified: Secondary | ICD-10-CM

## 2018-07-03 DIAGNOSIS — E538 Deficiency of other specified B group vitamins: Secondary | ICD-10-CM | POA: Diagnosis not present

## 2018-07-03 DIAGNOSIS — I2510 Atherosclerotic heart disease of native coronary artery without angina pectoris: Secondary | ICD-10-CM | POA: Diagnosis not present

## 2018-07-03 DIAGNOSIS — I739 Peripheral vascular disease, unspecified: Secondary | ICD-10-CM

## 2018-07-03 DIAGNOSIS — E785 Hyperlipidemia, unspecified: Secondary | ICD-10-CM | POA: Diagnosis not present

## 2018-07-03 LAB — VITAMIN B12: Vitamin B-12: 428 pg/mL (ref 211–911)

## 2018-07-03 MED ORDER — CYANOCOBALAMIN 1000 MCG/ML IJ SOLN
1000.0000 ug | Freq: Once | INTRAMUSCULAR | Status: AC
  Administered 2018-07-03: 1000 ug via INTRAMUSCULAR

## 2018-07-03 NOTE — Addendum Note (Signed)
Addended by: Ander Slade on: 07/03/2018 09:29 AM   Modules accepted: Orders

## 2018-07-03 NOTE — Progress Notes (Signed)
I have reviewed and agree.

## 2018-07-04 DIAGNOSIS — I129 Hypertensive chronic kidney disease with stage 1 through stage 4 chronic kidney disease, or unspecified chronic kidney disease: Secondary | ICD-10-CM | POA: Diagnosis not present

## 2018-07-04 DIAGNOSIS — N189 Chronic kidney disease, unspecified: Secondary | ICD-10-CM | POA: Diagnosis not present

## 2018-07-04 DIAGNOSIS — D631 Anemia in chronic kidney disease: Secondary | ICD-10-CM | POA: Diagnosis not present

## 2018-07-04 DIAGNOSIS — N390 Urinary tract infection, site not specified: Secondary | ICD-10-CM | POA: Diagnosis not present

## 2018-07-04 DIAGNOSIS — N183 Chronic kidney disease, stage 3 (moderate): Secondary | ICD-10-CM | POA: Diagnosis not present

## 2018-07-04 LAB — LIPID PANEL
Chol/HDL Ratio: 2.4 ratio (ref 0.0–4.4)
Cholesterol, Total: 126 mg/dL (ref 100–199)
HDL: 53 mg/dL (ref >39)
LDL Calculated: 47 mg/dL (ref 0–99)
Triglycerides: 131 mg/dL (ref 0–149)
VLDL Cholesterol Cal: 26 mg/dL (ref 5–40)

## 2018-07-04 LAB — HEPATIC FUNCTION PANEL
ALK PHOS: 86 IU/L (ref 39–117)
ALT: 16 IU/L (ref 0–32)
AST: 19 IU/L (ref 0–40)
Albumin: 4.5 g/dL (ref 3.5–4.7)
BILIRUBIN TOTAL: 0.4 mg/dL (ref 0.0–1.2)
Bilirubin, Direct: 0.13 mg/dL (ref 0.00–0.40)
Total Protein: 7 g/dL (ref 6.0–8.5)

## 2018-07-05 ENCOUNTER — Other Ambulatory Visit: Payer: Self-pay | Admitting: Nephrology

## 2018-07-05 DIAGNOSIS — N183 Chronic kidney disease, stage 3 unspecified: Secondary | ICD-10-CM

## 2018-07-11 ENCOUNTER — Ambulatory Visit
Admission: RE | Admit: 2018-07-11 | Discharge: 2018-07-11 | Disposition: A | Discharge status: Home / Self Care | Payer: Medicare HMO | Source: Ambulatory Visit | Attending: Nephrology | Admitting: Nephrology

## 2018-07-11 DIAGNOSIS — N183 Chronic kidney disease, stage 3 unspecified: Secondary | ICD-10-CM

## 2018-07-17 ENCOUNTER — Other Ambulatory Visit: Payer: Self-pay | Admitting: Internal Medicine

## 2018-07-17 DIAGNOSIS — M159 Polyosteoarthritis, unspecified: Secondary | ICD-10-CM

## 2018-07-17 DIAGNOSIS — M479 Spondylosis, unspecified: Secondary | ICD-10-CM

## 2018-07-17 DIAGNOSIS — M150 Primary generalized (osteo)arthritis: Principal | ICD-10-CM

## 2018-07-17 MED ORDER — TRAMADOL HCL 50 MG PO TABS: ORAL_TABLET | ORAL | 5 refills | Status: DC

## 2018-07-17 NOTE — Telephone Encounter (Signed)
Check [place] registry last filled 30 day supply 04/15/2018.Marland KitchenJohny Wilkinson

## 2018-07-17 NOTE — Telephone Encounter (Signed)
Requested medication (s) are due for refill today: yes  Requested medication (s) are on the active medication list: yes  Last refill:  04/12/18  Future visit scheduled: no  Notes to clinic:  Not delegated  Requested Prescriptions  Pending Prescriptions Disp Refills   traMADol (ULTRAM) 50 MG tablet 120 tablet 5    Sig: TAKE 1 TABLET BY MOUTH EVERY 6 HOURS AS NEEDED FOR MODERATE PAIN     Not Delegated - Analgesics:  Opioid Agonists Failed - 07/17/2018  8:39 AM      Failed - This refill cannot be delegated      Failed - Urine Drug Screen completed in last 360 days.      Passed - Valid encounter within last 6 months    Recent Outpatient Visits          2 months ago Essential hypertension   [facility] -Mayer Camel, MD   1 year ago Mucinous carcinoma of skin   [place], Thomas L, MD   1 year ago Type 2 diabetes mellitus with complication, with long-term current use of insulin [facility])   [facility] -Mayer Camel, MD   1 year ago Iron deficiency anemia secondary to inadequate dietary iron intake   [place], Thomas L, MD   1 year ago Essential hypertension   [place], Thomas L, MD

## 2018-07-17 NOTE — Telephone Encounter (Addendum)
Copied from [place] [id number]. Topic: Quick Communication - See Telephone Encounter >> Jul 17, 2018  8:01 AM Ivar Drape wrote: CRM for notification. See Telephone encounter for: 07/17/18. Patient would like a refill on her traMADol (ULTRAM) 50 MG tablet medication and have it sent to her preferred pharmacy [facility] [address].

## 2018-07-29 ENCOUNTER — Other Ambulatory Visit: Payer: Self-pay | Admitting: Internal Medicine

## 2018-07-29 ENCOUNTER — Telehealth: Payer: Self-pay | Admitting: Internal Medicine

## 2018-07-29 NOTE — Telephone Encounter (Signed)
No, she will have to control the pain with tramadol

## 2018-07-29 NOTE — Telephone Encounter (Signed)
Pt and pt granddtr informed of same.

## 2018-07-29 NOTE — Telephone Encounter (Signed)
Copied from [place]. Topic: General - Other >> Jul 29, 2018 10:38 AM Lennox Solders wrote: Reason for CRM:pt grand daughter Andrea Wilkinson is calling and the kidney doctor took her grandmother off of meloxicam due to her kidney only working 33 percent. Pt needs something else for rheumatoid  arthritis that will not affect her kidney. [facility] [place]

## 2018-07-29 NOTE — Telephone Encounter (Signed)
Is there an alternative to meloxicam that is safe for her kidney.

## 2018-08-06 ENCOUNTER — Other Ambulatory Visit: Payer: Self-pay

## 2018-08-06 ENCOUNTER — Encounter (HOSPITAL_COMMUNITY): Payer: Self-pay | Admitting: Emergency Medicine

## 2018-08-06 ENCOUNTER — Inpatient Hospital Stay (HOSPITAL_COMMUNITY)
Admission: EM | Admit: 2018-08-06 | Discharge: 2018-08-12 | DRG: 481 | Disposition: A | Discharge status: Skilled Nursing Facility | Payer: Medicare HMO | Attending: Student | Admitting: Student

## 2018-08-06 DIAGNOSIS — N183 Chronic kidney disease, stage 3 unspecified: Secondary | ICD-10-CM | POA: Diagnosis present

## 2018-08-06 DIAGNOSIS — R531 Weakness: Secondary | ICD-10-CM | POA: Diagnosis not present

## 2018-08-06 DIAGNOSIS — S72009A Fracture of unspecified part of neck of unspecified femur, initial encounter for closed fracture: Secondary | ICD-10-CM | POA: Diagnosis present

## 2018-08-06 DIAGNOSIS — I10 Essential (primary) hypertension: Secondary | ICD-10-CM | POA: Diagnosis not present

## 2018-08-06 DIAGNOSIS — W010XXA Fall on same level from slipping, tripping and stumbling without subsequent striking against object, initial encounter: Secondary | ICD-10-CM | POA: Diagnosis present

## 2018-08-06 DIAGNOSIS — S72122A Displaced fracture of lesser trochanter of left femur, initial encounter for closed fracture: Secondary | ICD-10-CM

## 2018-08-06 DIAGNOSIS — Z85828 Personal history of other malignant neoplasm of skin: Secondary | ICD-10-CM | POA: Diagnosis not present

## 2018-08-06 DIAGNOSIS — N189 Chronic kidney disease, unspecified: Secondary | ICD-10-CM | POA: Diagnosis not present

## 2018-08-06 DIAGNOSIS — W19XXXD Unspecified fall, subsequent encounter: Secondary | ICD-10-CM | POA: Diagnosis not present

## 2018-08-06 DIAGNOSIS — I255 Ischemic cardiomyopathy: Secondary | ICD-10-CM | POA: Diagnosis present

## 2018-08-06 DIAGNOSIS — I2510 Atherosclerotic heart disease of native coronary artery without angina pectoris: Secondary | ICD-10-CM | POA: Diagnosis present

## 2018-08-06 DIAGNOSIS — R52 Pain, unspecified: Secondary | ICD-10-CM | POA: Diagnosis not present

## 2018-08-06 DIAGNOSIS — Z7401 Bed confinement status: Secondary | ICD-10-CM | POA: Diagnosis not present

## 2018-08-06 DIAGNOSIS — Z951 Presence of aortocoronary bypass graft: Secondary | ICD-10-CM

## 2018-08-06 DIAGNOSIS — E11319 Type 2 diabetes mellitus with unspecified diabetic retinopathy without macular edema: Secondary | ICD-10-CM | POA: Diagnosis present

## 2018-08-06 DIAGNOSIS — Z9181 History of falling: Secondary | ICD-10-CM

## 2018-08-06 DIAGNOSIS — Z96652 Presence of left artificial knee joint: Secondary | ICD-10-CM | POA: Diagnosis present

## 2018-08-06 DIAGNOSIS — Z90710 Acquired absence of both cervix and uterus: Secondary | ICD-10-CM

## 2018-08-06 DIAGNOSIS — Z794 Long term (current) use of insulin: Secondary | ICD-10-CM

## 2018-08-06 DIAGNOSIS — I129 Hypertensive chronic kidney disease with stage 1 through stage 4 chronic kidney disease, or unspecified chronic kidney disease: Secondary | ICD-10-CM | POA: Diagnosis present

## 2018-08-06 DIAGNOSIS — Z4789 Encounter for other orthopedic aftercare: Secondary | ICD-10-CM | POA: Diagnosis not present

## 2018-08-06 DIAGNOSIS — S72142A Displaced intertrochanteric fracture of left femur, initial encounter for closed fracture: Principal | ICD-10-CM | POA: Diagnosis present

## 2018-08-06 DIAGNOSIS — M80059A Age-related osteoporosis with current pathological fracture, unspecified femur, initial encounter for fracture: Secondary | ICD-10-CM | POA: Diagnosis not present

## 2018-08-06 DIAGNOSIS — E669 Obesity, unspecified: Secondary | ICD-10-CM | POA: Diagnosis present

## 2018-08-06 DIAGNOSIS — M80052D Age-related osteoporosis with current pathological fracture, left femur, subsequent encounter for fracture with routine healing: Secondary | ICD-10-CM | POA: Diagnosis not present

## 2018-08-06 DIAGNOSIS — F0390 Unspecified dementia without behavioral disturbance: Secondary | ICD-10-CM | POA: Diagnosis present

## 2018-08-06 DIAGNOSIS — M2550 Pain in unspecified joint: Secondary | ICD-10-CM | POA: Diagnosis not present

## 2018-08-06 DIAGNOSIS — E1151 Type 2 diabetes mellitus with diabetic peripheral angiopathy without gangrene: Secondary | ICD-10-CM | POA: Diagnosis present

## 2018-08-06 DIAGNOSIS — M80052A Age-related osteoporosis with current pathological fracture, left femur, initial encounter for fracture: Secondary | ICD-10-CM | POA: Diagnosis present

## 2018-08-06 DIAGNOSIS — Z5189 Encounter for other specified aftercare: Secondary | ICD-10-CM | POA: Diagnosis not present

## 2018-08-06 DIAGNOSIS — Z9049 Acquired absence of other specified parts of digestive tract: Secondary | ICD-10-CM | POA: Diagnosis not present

## 2018-08-06 DIAGNOSIS — F329 Major depressive disorder, single episode, unspecified: Secondary | ICD-10-CM | POA: Diagnosis present

## 2018-08-06 DIAGNOSIS — S72142D Displaced intertrochanteric fracture of left femur, subsequent encounter for closed fracture with routine healing: Secondary | ICD-10-CM | POA: Diagnosis not present

## 2018-08-06 DIAGNOSIS — E1122 Type 2 diabetes mellitus with diabetic chronic kidney disease: Secondary | ICD-10-CM | POA: Diagnosis present

## 2018-08-06 DIAGNOSIS — Z419 Encounter for procedure for purposes other than remedying health state, unspecified: Secondary | ICD-10-CM

## 2018-08-06 DIAGNOSIS — Z885 Allergy status to narcotic agent status: Secondary | ICD-10-CM | POA: Diagnosis not present

## 2018-08-06 DIAGNOSIS — Z79899 Other long term (current) drug therapy: Secondary | ICD-10-CM | POA: Diagnosis not present

## 2018-08-06 DIAGNOSIS — Y92009 Unspecified place in unspecified non-institutional (private) residence as the place of occurrence of the external cause: Secondary | ICD-10-CM

## 2018-08-06 DIAGNOSIS — G709 Myoneural disorder, unspecified: Secondary | ICD-10-CM | POA: Diagnosis not present

## 2018-08-06 DIAGNOSIS — S79912A Unspecified injury of left hip, initial encounter: Secondary | ICD-10-CM | POA: Diagnosis not present

## 2018-08-06 DIAGNOSIS — K219 Gastro-esophageal reflux disease without esophagitis: Secondary | ICD-10-CM | POA: Diagnosis present

## 2018-08-06 DIAGNOSIS — M25552 Pain in left hip: Secondary | ICD-10-CM | POA: Diagnosis present

## 2018-08-06 DIAGNOSIS — K449 Diaphragmatic hernia without obstruction or gangrene: Secondary | ICD-10-CM | POA: Diagnosis present

## 2018-08-06 DIAGNOSIS — Z7982 Long term (current) use of aspirin: Secondary | ICD-10-CM

## 2018-08-06 DIAGNOSIS — S7292XA Unspecified fracture of left femur, initial encounter for closed fracture: Secondary | ICD-10-CM | POA: Diagnosis not present

## 2018-08-06 DIAGNOSIS — R9431 Abnormal electrocardiogram [ECG] [EKG]: Secondary | ICD-10-CM | POA: Diagnosis not present

## 2018-08-06 DIAGNOSIS — W19XXXA Unspecified fall, initial encounter: Secondary | ICD-10-CM

## 2018-08-06 DIAGNOSIS — S0990XA Unspecified injury of head, initial encounter: Secondary | ICD-10-CM | POA: Diagnosis not present

## 2018-08-06 DIAGNOSIS — Z833 Family history of diabetes mellitus: Secondary | ICD-10-CM

## 2018-08-06 DIAGNOSIS — R0902 Hypoxemia: Secondary | ICD-10-CM | POA: Diagnosis not present

## 2018-08-06 DIAGNOSIS — I252 Old myocardial infarction: Secondary | ICD-10-CM | POA: Diagnosis not present

## 2018-08-06 DIAGNOSIS — Z01818 Encounter for other preprocedural examination: Secondary | ICD-10-CM | POA: Diagnosis not present

## 2018-08-06 DIAGNOSIS — S72122D Displaced fracture of lesser trochanter of left femur, subsequent encounter for closed fracture with routine healing: Secondary | ICD-10-CM | POA: Diagnosis not present

## 2018-08-06 DIAGNOSIS — E11311 Type 2 diabetes mellitus with unspecified diabetic retinopathy with macular edema: Secondary | ICD-10-CM | POA: Diagnosis not present

## 2018-08-06 DIAGNOSIS — E1165 Type 2 diabetes mellitus with hyperglycemia: Secondary | ICD-10-CM | POA: Diagnosis not present

## 2018-08-06 NOTE — ED Notes (Signed)
Bed: PL68 Expected date:  Expected time:  Means of arrival:  Comments: EMS: Fall

## 2018-08-06 NOTE — ED Provider Notes (Addendum)
[facility] DEPT Provider Note  CSN: [id number] Arrival date & time: 08/06/18 2304  Chief Complaint(s) Fall  HPI Andrea Wilkinson is a 85 y.o. female with extended past medical history listed below including CAD status post CABG in 2017, diabetes on insulin, mild dementia who presents to the emergency department after being found down by her granddaughter earlier this evening.  Family last spoke with the patient over the phone around 1430 this afternoon.  The granddaughter got to the patient's home around 2000 this evening.  She found the patient down screaming for help complaining of left hip pain.  Patient states that she hurts all over.  Is unsure about any head trauma.  And so unsure about loss of consciousness.  Granddaughter states that the patient is changing her story which is typical for her.  Patient believes she fell around 1500 this afternoon.  States that she was walking to the living room when she fell.  Currently complaining of left hip pain.  No other physical complaints.  Remainder of history, ROS, and physical exam limited due to patient's condition (dementia). Additional information was obtained from family.   Level V Caveat.    HPI  Past Medical History Past Medical History:  Diagnosis Date  . CAD (coronary artery disease)    a. s/p CABG 03/2016.  Marland Kitchen Cancer ([place])    basal cell cancer on face  . Cervical spine arthritis   . CKD (chronic kidney disease), stage III ([place])   . Depression   . Diabetic retinopathy ([place])   . DJD (degenerative joint disease)   . DM (diabetes mellitus), type 2 ([place])    type 2  . Esophageal stricture   . GERD (gastroesophageal reflux disease)   . History of hiatal hernia   . Hypertension   . Memory loss   . Mild aortic insufficiency   . Mild mitral regurgitation   . Myocardial infarction ([place])   . Obesity   . Postoperative atrial fibrillation ([place]) 2017  . PVD (peripheral vascular disease) ([place])    a. mod  disease by R ABI, 1-39% BICA.   Patient Active Problem List   Diagnosis Date Noted  . Hypertriglyceridemia, essential 05/01/2018  . Degeneration of lumbar intervertebral disc 07/21/2017  . Lumbago with sciatica 07/21/2017  . CAD (coronary artery disease) 07/21/2016  . Cardiomyopathy, ischemic 03/23/2016  . CKD (chronic kidney disease) stage 3, GFR 30-59 ml/min (HCC) 03/23/2016  . GERD (gastroesophageal reflux disease) 01/18/2016  . Dietary vitamin B12 deficiency anemia 11/03/2015  . Diabetic retinopathy of left eye associated with type 2 diabetes mellitus ([place]) 08/10/2015  . OAB (overactive bladder) 05/05/2015  . Routine general medical examination at a health care facility 05/05/2015  . IBS (irritable bowel syndrome) 08/17/2014  . Eczema, allergic 04/07/2014  . Hyperlipidemia LDL goal <70 09/24/2013  . ANEMIA, IRON DEFICIENCY 11/05/2009  . Essential hypertension 06/07/2007  . Osteoarthritis 06/07/2007  . Osteoarthritis of spine 06/07/2007   Home Medication(s) Prior to Admission medications   Medication Sig Start Date End Date Taking? Authorizing Provider  aspirin EC 81 MG EC tablet Take 1 tablet (81 mg total) by mouth daily. 03/30/16  Yes Gold, Wayne E, PA-C  Insulin Degludec (TRESIBA FLEXTOUCH) 200 UNIT/ML SOPN Inject 20 Units into the skin daily. 05/03/18  Yes Janith Lima, MD  Omega-3 Fatty Acids (EQL FISH OIL PO) Take 1 tablet by mouth daily.   Yes [provider]  Alcohol Swabs (B-D SINGLE USE SWABS REGULAR) PADS  05/13/18   [provider]  atorvastatin (LIPITOR) 10 MG tablet Take 1 tablet (10 mg total) by mouth daily. Patient not taking: Reported on 08/07/2018 05/21/18 08/19/18  Charlie Pitter, PA-C  Blood Glucose Calibration (ACCU-CHEK AVIVA) SOLN  05/13/18   [provider]  Blood Glucose Monitoring Suppl (ACCU-CHEK AVIVA PLUS) W/DEVICE KIT Use to check blood sugars daily 01/07/14   Janith Lima, MD  glucose blood test strip 1 each by Other route  2 (two) times daily. DX: E11.8 05/13/18   Janith Lima, MD  Lancets (ACCU-CHEK SOFT TOUCH) lancets Use to check blood sugar bid. DX: E11.8 05/13/18   Janith Lima, MD  omeprazole (PRILOSEC) 20 MG capsule TAKE 1 CAPSULE EVERY DAY Patient not taking: Reported on 08/07/2018 11/19/17   Janith Lima, MD  traMADol (ULTRAM) 50 MG tablet TAKE 1 TABLET BY MOUTH EVERY 6 HOURS AS NEEDED FOR MODERATE PAIN Patient not taking: Reported on 08/07/2018 07/17/18   Janith Lima, MD                                                                                                                                    Past Surgical History Past Surgical History:  Procedure Laterality Date  . ABDOMINAL HYSTERECTOMY    . AIR/FLUID EXCHANGE Left 08/10/2015   Procedure: AIR/FLUID EXCHANGE, left eye;  Surgeon: Hayden , MD;  Location: [facility];  Service: Ophthalmology;  Laterality: Left;  . benign tumor removed from left hip    . CARDIAC CATHETERIZATION N/A 03/22/2016   Procedure: Left Heart Cath and Coronary Angiography;  Surgeon: Jettie Booze, MD;  Location: [facility];  Service: Cardiovascular;  Laterality: N/A;  . CATARACT EXTRACTION, BILATERAL  July 2013   Left done on 12/18/11 and right done on 01/15/2012  . CHOLECYSTECTOMY    . CORONARY ARTERY BYPASS GRAFT N/A 03/24/2016   Procedure: CORONARY ARTERY BYPASS GRAFTING (CABG) x 4 utilizing left internal mammary artery and left greater saphenous vein, bilateral saphenous veins harvested endoscopicaly,  SVG to PD, LIMA  to LAD, SEQ SVG to OM1 and OM2;  Surgeon: Grace Isaac, MD;  Location: [facility];  Service: Open Heart Surgery;  Laterality: N/A;  . DILATION AND CURETTAGE OF UTERUS    . LASER PHOTO ABLATION Left 08/10/2015   Procedure: LASER PHOTO ABLATION, left eye;  Surgeon: Hayden , MD;  Location: [facility];  Service: Ophthalmology;  Laterality: Left;  . left breast lumpectomy    . MEMBRANE PEEL Left 08/10/2015   Procedure: MEMBRANE PEEL, left  eye;  Surgeon: Hayden , MD;  Location: [facility];  Service: Ophthalmology;  Laterality: Left;  . PARS PLANA VITRECTOMY Left 08/10/2015   Procedure: PARS PLANA VITRECTOMY WITH 25 GAUGE, headscope laser;  Surgeon: Hayden , MD;  Location: [facility];  Service: Ophthalmology;  Laterality: Left;  . TEE WITHOUT CARDIOVERSION N/A 03/24/2016  Procedure: TRANSESOPHAGEAL ECHOCARDIOGRAM (TEE);  Surgeon: Grace Isaac, MD;  Location: [facility];  Service: Open Heart Surgery;  Laterality: N/A;  . TOTAL KNEE ARTHROPLASTY     left Jan '12; right May '12 - by Dr. Alvan Dame  . TUBAL LIGATION     Family History Family History  Problem Relation Age of Onset  . Diabetes Father     Social History Social History   Tobacco Use  . Smoking status: Never Smoker  . Smokeless tobacco: Never Used  Substance Use Topics  . Alcohol use: No  . Drug use: No   Allergies Codeine  Review of Systems Review of Systems  Unable to perform ROS: Dementia    Physical Exam Vital Signs  I have reviewed the triage vital signs BP (!) 179/86 (BP Location: Right Arm)   Pulse 82   Temp 99.2 F (37.3 C) (Oral)   Resp 18   Ht 5' (1.524 m)   Wt 72.6 kg   SpO2 95%   BMI 31.25 kg/m   Physical Exam Constitutional:      General: She is not in acute distress.    Appearance: She is well-developed. She is not diaphoretic.  HENT:     Head: Normocephalic and atraumatic.     Right Ear: External ear normal.     Left Ear: External ear normal.     Nose: Nose normal.  Eyes:     General: No scleral icterus.       Right eye: No discharge.        Left eye: No discharge.     Conjunctiva/sclera: Conjunctivae normal.     Pupils: Pupils are equal, round, and reactive to light.  Neck:     Musculoskeletal: Normal range of motion and neck supple.  Cardiovascular:     Rate and Rhythm: Normal rate and regular rhythm.     Pulses:          Radial pulses are 2+ on the right side and 2+ on the left side.       Dorsalis pedis pulses  are 2+ on the right side and 2+ on the left side.     Heart sounds: Normal heart sounds. No murmur. No friction rub. No gallop.   Pulmonary:     Effort: Pulmonary effort is normal. No respiratory distress.     Breath sounds: Normal breath sounds. No stridor. No wheezing.  Abdominal:     General: There is no distension.     Palpations: Abdomen is soft.     Tenderness: There is no abdominal tenderness.  Musculoskeletal:     Left hip: She exhibits tenderness, bony tenderness and deformity (shortened and externally rotated).     Cervical back: She exhibits no bony tenderness.     Thoracic back: She exhibits no bony tenderness.     Lumbar back: She exhibits no bony tenderness.     Comments: Clavicles stable. Chest stable to AP/Lat compression. Pelvis stable to Lat compression. No obvious extremity deformity. No chest or abdominal wall contusion.  Skin:    General: Skin is warm and dry.     Findings: No erythema or rash.  Neurological:     Mental Status: She is alert.     Comments: Oriented to person, place and time (not year)     ED Results and Treatments Labs (all labs ordered are listed, but only abnormal results are displayed) Labs Reviewed  URINALYSIS, ROUTINE W REFLEX MICROSCOPIC - Abnormal; Notable for the following components:  Result Value   Color, Urine STRAW (*)    Glucose, UA 150 (*)    All other components within normal limits  CBC WITH DIFFERENTIAL/PLATELET - Abnormal; Notable for the following components:   WBC 11.0 (*)    Hemoglobin 11.5 (*)    Neutro Abs 9.7 (*)    All other components within normal limits  BASIC METABOLIC PANEL - Abnormal; Notable for the following components:   Glucose, Bld 304 (*)    BUN 38 (*)    Creatinine, Ser 1.63 (*)    GFR calc non Af Amer 29 (*)    GFR calc Af Amer 33 (*)    All other components within normal limits  PROTIME-INR  I-STAT TROPONIN, ED                                                                                                                          EKG  EKG Interpretation  Date/Time:  Wednesday August 07 2018 00:59:51 EST Ventricular Rate:  92 PR Interval:    QRS Duration: 87 QT Interval:  351 QTC Calculation: 435 R Axis:   26 Text Interpretation:  Sinus rhythm Borderline repolarization abnormality No significant change since last tracing Confirmed by Addison Lank [phone] on 08/07/2018 1:15:10 AM      Radiology Ct Head Wo Contrast  Result Date: 08/07/2018 CLINICAL DATA:  85 year old female status post fall with head trauma. EXAM: CT HEAD WITHOUT CONTRAST TECHNIQUE: Contiguous axial images were obtained from the base of the skull through the vertex without intravenous contrast. COMPARISON:  Head and cervical spine CT 06/11/2015. FINDINGS: Brain: Stable cerebral volume, normal for age. Chronic Patchy and confluent cerebral white matter hypodensity appears stable. No midline shift, ventriculomegaly, mass effect, evidence of mass lesion, intracranial hemorrhage or evidence of cortically based acute infarction. Vascular: Extensive calcified intracranial artery atherosclerosis with superimposed arterial ectasia. Skull: No acute osseous abnormality identified. Sinuses/Orbits: Visualized paranasal sinuses and mastoids are stable and well pneumatized. Other: No scalp hematoma identified. Stable orbits. IMPRESSION: 1. No acute intracranial abnormality or acute traumatic injury identified. 2. 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 Electronically Signed   By: Genevie Ann M.D.   On: 08/07/2018 00:30   Dg Hip Unilat W Or W/o Pelvis 2-3 Views Left  Result Date: 08/07/2018 CLINICAL DATA:  Left hip pain EXAM: DG HIP (WITH OR WITHOUT PELVIS) 2-3V LEFT COMPARISON:  None. FINDINGS: There is a medially displaced fracture of the lesser trochanter of the left femur. There is lucency extending along the intertrochanteric line without displacement.  Cross-table lateral views are limited by patient body habitus. No pelvic ring fracture. IMPRESSION: Medially displaced fracture of the left femur lesser trochanter with lucency along the intertrochanteric line possibly indicating nondisplaced intertrochanteric fracture. Electronically Signed   By: Ulyses Jarred M.D.   On: 08/07/2018 00:58   Pertinent labs & imaging results that were available during my care of the patient were reviewed by me and  considered in my medical decision making (see chart for details).  Medications Ordered in ED Medications  fentaNYL (SUBLIMAZE) injection 75 mcg (75 mcg Intravenous Given 08/07/18 0055)  methocarbamol (ROBAXIN) 500 mg in dextrose 5 % 50 mL IVPB (500 mg Intravenous New Bag/Given 08/07/18 0048)                                                                                                                                    Procedures Procedures  (including critical care time)  Medical Decision Making / ED Course I have reviewed the nursing notes for this encounter and the patient's prior records (if available in EHR or on provided paperwork).    Patient presents after an unwitnessed fall.  Details of history difficult given the patient's dementia.  On exam patient has obvious deformity to the left hip concerning for left hip fracture.  No other injuries noted on exam.  CT of the head negative for ICH.  Plain film of the left hip revealed fracture of the left femur lesser trochanter with a likely intertrochanteric nondisplaced fracture.  EKG without acute ischemic changes or evidence of pericarditis.  Troponin negative.  Rest of the labs are grossly reassuring.  UA without evidence of infection.  Patient provided with pain meds and knee immobilizer.  Consults orthopedic surgery and will admit to medicine.  Final Clinical Impression(s) / ED Diagnoses Final diagnoses:  Fall, initial encounter  Closed displaced fracture of lesser trochanter of left femur,  initial encounter [facility])      This chart was dictated using voice recognition software.  Despite best efforts to proofread,  errors can occur which can change the documentation meaning.     Fatima Blank, MD 08/07/18 [phone]

## 2018-08-06 NOTE — ED Triage Notes (Addendum)
Fall from standing causing leg hip pain with positive shortening and external rotation rates pain 8/10. VS BP 182/96 HR 82 bpm, resp 16 cbg 322

## 2018-08-07 ENCOUNTER — Emergency Department (HOSPITAL_COMMUNITY): Payer: Medicare HMO

## 2018-08-07 ENCOUNTER — Inpatient Hospital Stay (HOSPITAL_COMMUNITY): Payer: Medicare HMO | Admitting: Anesthesiology

## 2018-08-07 ENCOUNTER — Encounter (HOSPITAL_COMMUNITY): Payer: Self-pay | Admitting: General Practice

## 2018-08-07 ENCOUNTER — Other Ambulatory Visit: Payer: Self-pay

## 2018-08-07 ENCOUNTER — Inpatient Hospital Stay (HOSPITAL_COMMUNITY): Payer: Medicare HMO

## 2018-08-07 ENCOUNTER — Encounter (HOSPITAL_COMMUNITY)
Admission: EM | Disposition: A | Discharge status: Skilled Nursing Facility | Payer: Self-pay | Source: Home / Self Care | Attending: Student

## 2018-08-07 DIAGNOSIS — M80052A Age-related osteoporosis with current pathological fracture, left femur, initial encounter for fracture: Secondary | ICD-10-CM | POA: Diagnosis present

## 2018-08-07 DIAGNOSIS — F329 Major depressive disorder, single episode, unspecified: Secondary | ICD-10-CM | POA: Diagnosis present

## 2018-08-07 DIAGNOSIS — S72142A Displaced intertrochanteric fracture of left femur, initial encounter for closed fracture: Secondary | ICD-10-CM | POA: Diagnosis present

## 2018-08-07 DIAGNOSIS — I255 Ischemic cardiomyopathy: Secondary | ICD-10-CM | POA: Diagnosis present

## 2018-08-07 DIAGNOSIS — R0902 Hypoxemia: Secondary | ICD-10-CM | POA: Diagnosis not present

## 2018-08-07 DIAGNOSIS — E1122 Type 2 diabetes mellitus with diabetic chronic kidney disease: Secondary | ICD-10-CM | POA: Diagnosis present

## 2018-08-07 DIAGNOSIS — F0390 Unspecified dementia without behavioral disturbance: Secondary | ICD-10-CM | POA: Diagnosis present

## 2018-08-07 DIAGNOSIS — Z79899 Other long term (current) drug therapy: Secondary | ICD-10-CM | POA: Diagnosis not present

## 2018-08-07 DIAGNOSIS — S72009A Fracture of unspecified part of neck of unspecified femur, initial encounter for closed fracture: Secondary | ICD-10-CM | POA: Diagnosis present

## 2018-08-07 DIAGNOSIS — I129 Hypertensive chronic kidney disease with stage 1 through stage 4 chronic kidney disease, or unspecified chronic kidney disease: Secondary | ICD-10-CM | POA: Diagnosis present

## 2018-08-07 DIAGNOSIS — Y92009 Unspecified place in unspecified non-institutional (private) residence as the place of occurrence of the external cause: Secondary | ICD-10-CM | POA: Diagnosis not present

## 2018-08-07 DIAGNOSIS — E1151 Type 2 diabetes mellitus with diabetic peripheral angiopathy without gangrene: Secondary | ICD-10-CM | POA: Diagnosis present

## 2018-08-07 DIAGNOSIS — M80059A Age-related osteoporosis with current pathological fracture, unspecified femur, initial encounter for fracture: Secondary | ICD-10-CM | POA: Diagnosis not present

## 2018-08-07 DIAGNOSIS — N183 Chronic kidney disease, stage 3 (moderate): Secondary | ICD-10-CM | POA: Diagnosis present

## 2018-08-07 DIAGNOSIS — K219 Gastro-esophageal reflux disease without esophagitis: Secondary | ICD-10-CM | POA: Diagnosis present

## 2018-08-07 DIAGNOSIS — Z794 Long term (current) use of insulin: Secondary | ICD-10-CM | POA: Diagnosis not present

## 2018-08-07 DIAGNOSIS — Z90710 Acquired absence of both cervix and uterus: Secondary | ICD-10-CM | POA: Diagnosis not present

## 2018-08-07 DIAGNOSIS — M25552 Pain in left hip: Secondary | ICD-10-CM | POA: Diagnosis present

## 2018-08-07 DIAGNOSIS — I252 Old myocardial infarction: Secondary | ICD-10-CM | POA: Diagnosis not present

## 2018-08-07 DIAGNOSIS — K449 Diaphragmatic hernia without obstruction or gangrene: Secondary | ICD-10-CM | POA: Diagnosis present

## 2018-08-07 DIAGNOSIS — Z96652 Presence of left artificial knee joint: Secondary | ICD-10-CM | POA: Diagnosis present

## 2018-08-07 DIAGNOSIS — E669 Obesity, unspecified: Secondary | ICD-10-CM | POA: Diagnosis present

## 2018-08-07 DIAGNOSIS — Z85828 Personal history of other malignant neoplasm of skin: Secondary | ICD-10-CM | POA: Diagnosis not present

## 2018-08-07 DIAGNOSIS — Z951 Presence of aortocoronary bypass graft: Secondary | ICD-10-CM | POA: Diagnosis not present

## 2018-08-07 DIAGNOSIS — W010XXA Fall on same level from slipping, tripping and stumbling without subsequent striking against object, initial encounter: Secondary | ICD-10-CM | POA: Diagnosis present

## 2018-08-07 DIAGNOSIS — E11319 Type 2 diabetes mellitus with unspecified diabetic retinopathy without macular edema: Secondary | ICD-10-CM | POA: Diagnosis present

## 2018-08-07 DIAGNOSIS — Z9049 Acquired absence of other specified parts of digestive tract: Secondary | ICD-10-CM | POA: Diagnosis not present

## 2018-08-07 DIAGNOSIS — I2510 Atherosclerotic heart disease of native coronary artery without angina pectoris: Secondary | ICD-10-CM | POA: Diagnosis present

## 2018-08-07 DIAGNOSIS — Z885 Allergy status to narcotic agent status: Secondary | ICD-10-CM | POA: Diagnosis not present

## 2018-08-07 HISTORY — PX: FEMUR IM NAIL: SHX1597

## 2018-08-07 LAB — GLUCOSE, CAPILLARY
Glucose-Capillary: 183 mg/dL — ABNORMAL HIGH (ref 70–99)
Glucose-Capillary: 140 mg/dL — ABNORMAL HIGH (ref 70–99)
Glucose-Capillary: 157 mg/dL — ABNORMAL HIGH (ref 70–99)
Glucose-Capillary: 151 mg/dL — ABNORMAL HIGH (ref 70–99)

## 2018-08-07 LAB — URINALYSIS, ROUTINE W REFLEX MICROSCOPIC
Bilirubin Urine: NEGATIVE
Glucose, UA: 150 mg/dL — AB
Hgb urine dipstick: NEGATIVE
Ketones, ur: NEGATIVE mg/dL
LEUKOCYTE UA: NEGATIVE
Nitrite: NEGATIVE
Protein, ur: NEGATIVE mg/dL
Specific Gravity, Urine: 1.011 (ref 1.005–1.030)
pH: 7 (ref 5.0–8.0)

## 2018-08-07 LAB — BASIC METABOLIC PANEL
Anion gap: 10 (ref 5–15)
BUN: 38 mg/dL — ABNORMAL HIGH (ref 8–23)
CHLORIDE: 100 mmol/L (ref 98–111)
CO2: 25 mmol/L (ref 22–32)
Calcium: 9 mg/dL (ref 8.9–10.3)
Creatinine, Ser: 1.63 mg/dL — ABNORMAL HIGH (ref 0.44–1.00)
GFR calc Af Amer: 33 mL/min — ABNORMAL LOW (ref >60)
GFR calc non Af Amer: 29 mL/min — ABNORMAL LOW (ref >60)
Glucose, Bld: 304 mg/dL — ABNORMAL HIGH (ref 70–99)
Potassium: 4.1 mmol/L (ref 3.5–5.1)
Sodium: 135 mmol/L (ref 135–145)

## 2018-08-07 LAB — CBC WITH DIFFERENTIAL/PLATELET
Abs Immature Granulocytes: 0.03 10*3/uL (ref 0.00–0.07)
Basophils Absolute: 0 10*3/uL (ref 0.0–0.1)
Basophils Relative: 0 %
EOS ABS: 0.1 10*3/uL (ref 0.0–0.5)
Eosinophils Relative: 1 %
HCT: 36.8 % (ref 36.0–46.0)
Hemoglobin: 11.5 g/dL — ABNORMAL LOW (ref 12.0–15.0)
Immature Granulocytes: 0 %
LYMPHS ABS: 0.8 10*3/uL (ref 0.7–4.0)
Lymphocytes Relative: 7 %
MCH: 29.5 pg (ref 26.0–34.0)
MCHC: 31.3 g/dL (ref 30.0–36.0)
MCV: 94.4 fL (ref 80.0–100.0)
Monocytes Absolute: 0.4 10*3/uL (ref 0.1–1.0)
Monocytes Relative: 3 %
NRBC: 0 % (ref 0.0–0.2)
Neutro Abs: 9.7 10*3/uL — ABNORMAL HIGH (ref 1.7–7.7)
Neutrophils Relative %: 89 %
Platelets: 199 10*3/uL (ref 150–400)
RBC: 3.9 MIL/uL (ref 3.87–5.11)
RDW: 12.4 % (ref 11.5–15.5)
WBC: 11 10*3/uL — ABNORMAL HIGH (ref 4.0–10.5)

## 2018-08-07 LAB — PROTIME-INR
INR: 1.08
Prothrombin Time: 13.9 seconds (ref 11.4–15.2)

## 2018-08-07 LAB — TYPE AND SCREEN
ABO/RH(D): A POS
Antibody Screen: NEGATIVE

## 2018-08-07 LAB — SURGICAL PCR SCREEN
MRSA, PCR: NEGATIVE
Staphylococcus aureus: NEGATIVE

## 2018-08-07 LAB — I-STAT TROPONIN, ED: Troponin i, poc: 0.01 ng/mL (ref 0.00–0.08)

## 2018-08-07 SURGERY — INSERTION, INTRAMEDULLARY ROD, FEMUR
Anesthesia: General

## 2018-08-07 MED ORDER — ONDANSETRON HCL 4 MG/2ML IJ SOLN
INTRAMUSCULAR | Status: DC | PRN
  Administered 2018-08-07: 4 mg via INTRAVENOUS

## 2018-08-07 MED ORDER — PROPOFOL 10 MG/ML IV BOLUS
INTRAVENOUS | Status: AC
  Filled 2018-08-07: qty 60

## 2018-08-07 MED ORDER — ONDANSETRON HCL 4 MG/2ML IJ SOLN: 4.0000 mg | Freq: Four times a day (QID) | INTRAMUSCULAR | Status: DC | PRN

## 2018-08-07 MED ORDER — PROMETHAZINE HCL 25 MG/ML IJ SOLN: 6.2500 mg | INTRAMUSCULAR | Status: DC | PRN

## 2018-08-07 MED ORDER — ASPIRIN EC 81 MG PO TBEC
81.0000 mg | DELAYED_RELEASE_TABLET | Freq: Every day | ORAL | Status: DC
  Administered 2018-08-08 – 2018-08-12 (×5): 81 mg via ORAL
  Filled 2018-08-07 (×5): qty 1

## 2018-08-07 MED ORDER — ONDANSETRON HCL 4 MG PO TABS: 4.0000 mg | ORAL_TABLET | Freq: Four times a day (QID) | ORAL | Status: DC | PRN

## 2018-08-07 MED ORDER — OXYCODONE HCL 5 MG PO TABS: 10.0000 mg | ORAL_TABLET | ORAL | Status: DC | PRN

## 2018-08-07 MED ORDER — SODIUM CHLORIDE 0.9 % IV SOLN
INTRAVENOUS | Status: DC | PRN
  Administered 2018-08-07: 50 ug/min via INTRAVENOUS

## 2018-08-07 MED ORDER — HYDROMORPHONE HCL 1 MG/ML IJ SOLN
1.0000 mg | INTRAMUSCULAR | Status: DC | PRN
  Administered 2018-08-07: 1 mg via INTRAVENOUS
  Filled 2018-08-07: qty 1

## 2018-08-07 MED ORDER — METOCLOPRAMIDE HCL 5 MG PO TABS: 5.0000 mg | ORAL_TABLET | Freq: Three times a day (TID) | ORAL | Status: DC | PRN

## 2018-08-07 MED ORDER — EPHEDRINE SULFATE-NACL 50-0.9 MG/10ML-% IV SOSY
PREFILLED_SYRINGE | INTRAVENOUS | Status: DC | PRN
  Administered 2018-08-07: 10 mg via INTRAVENOUS

## 2018-08-07 MED ORDER — HYDROMORPHONE HCL 1 MG/ML IJ SOLN
INTRAMUSCULAR | Status: AC
  Administered 2018-08-07: 0.25 mg via INTRAVENOUS
  Filled 2018-08-07: qty 1

## 2018-08-07 MED ORDER — MENTHOL 3 MG MT LOZG: 1.0000 | LOZENGE | OROMUCOSAL | Status: DC | PRN

## 2018-08-07 MED ORDER — SODIUM CHLORIDE 0.9 % IV SOLN
INTRAVENOUS | Status: DC
  Administered 2018-08-07: 17:00:00 via INTRAVENOUS

## 2018-08-07 MED ORDER — ATORVASTATIN CALCIUM 10 MG PO TABS: 10.0000 mg | ORAL_TABLET | Freq: Every day | ORAL | Status: DC

## 2018-08-07 MED ORDER — FENTANYL CITRATE (PF) 100 MCG/2ML IJ SOLN
INTRAMUSCULAR | Status: AC
  Filled 2018-08-07: qty 2

## 2018-08-07 MED ORDER — ONDANSETRON HCL 4 MG/2ML IJ SOLN
INTRAMUSCULAR | Status: AC
  Filled 2018-08-07: qty 2

## 2018-08-07 MED ORDER — ACETAMINOPHEN 325 MG PO TABS
325.0000 mg | ORAL_TABLET | Freq: Four times a day (QID) | ORAL | Status: DC | PRN
  Administered 2018-08-09 – 2018-08-11 (×6): 650 mg via ORAL
  Filled 2018-08-07 (×6): qty 2

## 2018-08-07 MED ORDER — HYDROMORPHONE HCL 1 MG/ML IJ SOLN
0.2500 mg | INTRAMUSCULAR | Status: DC | PRN
  Administered 2018-08-07: 0.25 mg via INTRAVENOUS

## 2018-08-07 MED ORDER — PHENOL 1.4 % MT LIQD: 1.0000 | OROMUCOSAL | Status: DC | PRN

## 2018-08-07 MED ORDER — INSULIN GLARGINE 100 UNIT/ML ~~LOC~~ SOLN
10.0000 [IU] | Freq: Every day | SUBCUTANEOUS | Status: DC
  Administered 2018-08-07 – 2018-08-12 (×4): 10 [IU] via SUBCUTANEOUS
  Filled 2018-08-07 (×6): qty 0.1

## 2018-08-07 MED ORDER — FENTANYL CITRATE (PF) 100 MCG/2ML IJ SOLN
75.0000 ug | Freq: Once | INTRAMUSCULAR | Status: AC
  Administered 2018-08-07: 75 ug via INTRAVENOUS
  Filled 2018-08-07: qty 2

## 2018-08-07 MED ORDER — POVIDONE-IODINE 10 % EX SWAB: 2.0000 "application " | Freq: Once | CUTANEOUS | Status: DC

## 2018-08-07 MED ORDER — 0.9 % SODIUM CHLORIDE (POUR BTL) OPTIME
TOPICAL | Status: DC | PRN
  Administered 2018-08-07: 1000 mL

## 2018-08-07 MED ORDER — ENOXAPARIN SODIUM 30 MG/0.3ML ~~LOC~~ SOLN
30.0000 mg | Freq: Every day | SUBCUTANEOUS | Status: DC
  Administered 2018-08-08 – 2018-08-12 (×5): 30 mg via SUBCUTANEOUS
  Filled 2018-08-07 (×5): qty 0.3

## 2018-08-07 MED ORDER — CEFAZOLIN SODIUM-DEXTROSE 2-4 GM/100ML-% IV SOLN
2.0000 g | Freq: Four times a day (QID) | INTRAVENOUS | Status: DC
  Filled 2018-08-07: qty 100

## 2018-08-07 MED ORDER — SENNA 8.6 MG PO TABS
1.0000 | ORAL_TABLET | Freq: Two times a day (BID) | ORAL | Status: DC
  Administered 2018-08-07 – 2018-08-12 (×10): 8.6 mg via ORAL
  Filled 2018-08-07 (×9): qty 1

## 2018-08-07 MED ORDER — OXYCODONE HCL 5 MG PO TABS
5.0000 mg | ORAL_TABLET | ORAL | Status: DC | PRN
  Administered 2018-08-07 – 2018-08-08 (×4): 5 mg via ORAL
  Administered 2018-08-10: 10 mg via ORAL
  Administered 2018-08-11: 5 mg via ORAL
  Filled 2018-08-07: qty 1
  Filled 2018-08-07: qty 2
  Filled 2018-08-07 (×4): qty 1

## 2018-08-07 MED ORDER — BUPIVACAINE IN DEXTROSE 0.75-8.25 % IT SOLN
INTRATHECAL | Status: DC | PRN
  Administered 2018-08-07: 1.8 mL via INTRATHECAL

## 2018-08-07 MED ORDER — METHOCARBAMOL 1000 MG/10ML IJ SOLN
500.0000 mg | Freq: Four times a day (QID) | INTRAVENOUS | Status: DC | PRN
  Filled 2018-08-07: qty 5

## 2018-08-07 MED ORDER — ISOPROPYL ALCOHOL 70 % SOLN
Status: DC | PRN
  Administered 2018-08-07: 1 via TOPICAL

## 2018-08-07 MED ORDER — DOCUSATE SODIUM 100 MG PO CAPS
100.0000 mg | ORAL_CAPSULE | Freq: Two times a day (BID) | ORAL | Status: DC
  Administered 2018-08-07 – 2018-08-12 (×10): 100 mg via ORAL
  Filled 2018-08-07 (×9): qty 1

## 2018-08-07 MED ORDER — METHOCARBAMOL 1000 MG/10ML IJ SOLN: 500.0000 mg | Freq: Once | INTRAMUSCULAR | Status: DC

## 2018-08-07 MED ORDER — CHLORHEXIDINE GLUCONATE 4 % EX LIQD
60.0000 mL | Freq: Once | CUTANEOUS | Status: AC
  Administered 2018-08-07: 4 via TOPICAL
  Filled 2018-08-07: qty 60

## 2018-08-07 MED ORDER — OXYCODONE HCL 5 MG PO TABS: 5.0000 mg | ORAL_TABLET | Freq: Once | ORAL | Status: DC | PRN

## 2018-08-07 MED ORDER — KETAMINE HCL 10 MG/ML IJ SOLN
INTRAMUSCULAR | Status: DC | PRN
  Administered 2018-08-07: 10 mg via INTRAVENOUS

## 2018-08-07 MED ORDER — FENTANYL CITRATE (PF) 100 MCG/2ML IJ SOLN
INTRAMUSCULAR | Status: DC | PRN
  Administered 2018-08-07: 12.5 ug via INTRAVENOUS

## 2018-08-07 MED ORDER — INSULIN ASPART 100 UNIT/ML ~~LOC~~ SOLN
0.0000 [IU] | Freq: Three times a day (TID) | SUBCUTANEOUS | Status: DC
  Administered 2018-08-07 – 2018-08-08 (×2): 3 [IU] via SUBCUTANEOUS

## 2018-08-07 MED ORDER — CEFAZOLIN SODIUM-DEXTROSE 2-4 GM/100ML-% IV SOLN
2.0000 g | INTRAVENOUS | Status: AC
  Administered 2018-08-07: 2 g via INTRAVENOUS
  Filled 2018-08-07: qty 100

## 2018-08-07 MED ORDER — MORPHINE SULFATE (PF) 2 MG/ML IV SOLN
1.0000 mg | Freq: Once | INTRAVENOUS | Status: AC
  Administered 2018-08-07: 1 mg via INTRAVENOUS
  Filled 2018-08-07: qty 1

## 2018-08-07 MED ORDER — ISOPROPYL ALCOHOL 70 % SOLN
Status: AC
  Filled 2018-08-07: qty 480

## 2018-08-07 MED ORDER — PROPOFOL 500 MG/50ML IV EMUL
INTRAVENOUS | Status: DC | PRN
  Administered 2018-08-07: 25 ug/kg/min via INTRAVENOUS

## 2018-08-07 MED ORDER — DEXAMETHASONE SODIUM PHOSPHATE 10 MG/ML IJ SOLN
INTRAMUSCULAR | Status: AC
  Filled 2018-08-07: qty 1

## 2018-08-07 MED ORDER — HYDROMORPHONE HCL 1 MG/ML IJ SOLN: 1.0000 mg | INTRAMUSCULAR | Status: DC | PRN

## 2018-08-07 MED ORDER — OXYCODONE HCL 5 MG/5ML PO SOLN: 5.0000 mg | Freq: Once | ORAL | Status: DC | PRN

## 2018-08-07 MED ORDER — LACTATED RINGERS IV SOLN
INTRAVENOUS | Status: DC
  Administered 2018-08-07: 17:00:00 via INTRAVENOUS

## 2018-08-07 MED ORDER — INSULIN DEGLUDEC 200 UNIT/ML ~~LOC~~ SOPN: 10.0000 [IU] | PEN_INJECTOR | Freq: Every day | SUBCUTANEOUS | Status: DC

## 2018-08-07 MED ORDER — PROPOFOL 10 MG/ML IV BOLUS
INTRAVENOUS | Status: DC | PRN
  Administered 2018-08-07: 10 mg via INTRAVENOUS

## 2018-08-07 MED ORDER — METOCLOPRAMIDE HCL 5 MG/ML IJ SOLN: 5.0000 mg | Freq: Three times a day (TID) | INTRAMUSCULAR | Status: DC | PRN

## 2018-08-07 MED ORDER — AMLODIPINE BESYLATE 5 MG PO TABS
5.0000 mg | ORAL_TABLET | Freq: Every day | ORAL | Status: DC
  Administered 2018-08-07 – 2018-08-12 (×6): 5 mg via ORAL
  Filled 2018-08-07 (×6): qty 1

## 2018-08-07 MED ORDER — HYDROMORPHONE HCL 1 MG/ML IJ SOLN: 0.5000 mg | INTRAMUSCULAR | Status: DC | PRN

## 2018-08-07 MED ORDER — METHOCARBAMOL 500 MG PO TABS
500.0000 mg | ORAL_TABLET | Freq: Four times a day (QID) | ORAL | Status: DC | PRN
  Administered 2018-08-10 – 2018-08-11 (×4): 500 mg via ORAL
  Filled 2018-08-07 (×4): qty 1

## 2018-08-07 MED ORDER — ACETAMINOPHEN 325 MG PO TABS: 650.0000 mg | ORAL_TABLET | Freq: Four times a day (QID) | ORAL | Status: DC | PRN

## 2018-08-07 MED ORDER — METHOCARBAMOL 1000 MG/10ML IJ SOLN
500.0000 mg | Freq: Once | INTRAVENOUS | Status: AC
  Administered 2018-08-07: 500 mg via INTRAVENOUS
  Filled 2018-08-07: qty 500

## 2018-08-07 MED ORDER — METOPROLOL TARTRATE 25 MG PO TABS
37.5000 mg | ORAL_TABLET | Freq: Two times a day (BID) | ORAL | Status: DC
  Administered 2018-08-07 – 2018-08-12 (×11): 37.5 mg via ORAL
  Filled 2018-08-07: qty 1
  Filled 2018-08-07: qty 2
  Filled 2018-08-07 (×9): qty 1

## 2018-08-07 SURGICAL SUPPLY — 48 items
ADH SKN CLS APL DERMABOND .7 (GAUZE/BANDAGES/DRESSINGS) ×1
BAG SPEC THK2 15X12 ZIP CLS (MISCELLANEOUS)
BAG ZIPLOCK 12X15 (MISCELLANEOUS) IMPLANT
BIT DRILL CANN LG 4.3MM (BIT) IMPLANT
CHLORAPREP W/TINT 26ML (MISCELLANEOUS) ×3 IMPLANT
COVER PERINEAL POST (MISCELLANEOUS) ×2 IMPLANT
COVER SURGICAL LIGHT HANDLE (MISCELLANEOUS) ×2 IMPLANT
COVER WAND RF STERILE (DRAPES) IMPLANT
DERMABOND ADVANCED (GAUZE/BANDAGES/DRESSINGS) ×1
DERMABOND ADVANCED .7 DNX12 (GAUZE/BANDAGES/DRESSINGS) ×2 IMPLANT
DRAPE C-ARM 42X120 X-RAY (DRAPES) ×2 IMPLANT
DRAPE C-ARMOR (DRAPES) ×2 IMPLANT
DRAPE ORTHO SPLIT 77X108 STRL (DRAPES)
DRAPE STERI IOBAN 125X83 (DRAPES) ×2 IMPLANT
DRAPE SURG ORHT 6 SPLT 77X108 (DRAPES) IMPLANT
DRAPE U-SHAPE 47X51 STRL (DRAPES) ×4 IMPLANT
DRILL BIT CANN LG 4.3MM (BIT) ×2
DRSG AQUACEL AG ADV 3.5X 4 (GAUZE/BANDAGES/DRESSINGS) ×1 IMPLANT
DRSG AQUACEL AG ADV 3.5X 6 (GAUZE/BANDAGES/DRESSINGS) ×1 IMPLANT
DRSG MEPILEX BORDER 4X4 (GAUZE/BANDAGES/DRESSINGS) ×4 IMPLANT
ELECT BLADE TIP CTD 4 INCH (ELECTRODE) ×2 IMPLANT
FACESHIELD WRAPAROUND (MASK) ×4 IMPLANT
FACESHIELD WRAPAROUND OR TEAM (MASK) ×2 IMPLANT
GAUZE SPONGE 4X4 12PLY STRL (GAUZE/BANDAGES/DRESSINGS) ×2 IMPLANT
GLOVE BIO SURGEON STRL SZ8.5 (GLOVE) ×4 IMPLANT
GLOVE BIOGEL PI IND STRL 8.5 (GLOVE) ×1 IMPLANT
GLOVE BIOGEL PI INDICATOR 8.5 (GLOVE) ×1
GOWN SPEC L3 XXLG W/TWL (GOWN DISPOSABLE) ×4 IMPLANT
GUIDEPIN 3.2X17.5 THRD DISP (PIN) ×2 IMPLANT
HFN 125 DEG 11MM X 180MM (Orthopedic Implant) ×1 IMPLANT
HOLDER FOLEY CATH W/STRAP (MISCELLANEOUS) ×1 IMPLANT
KIT BASIN OR (CUSTOM PROCEDURE TRAY) ×2 IMPLANT
MANIFOLD NEPTUNE II (INSTRUMENTS) ×2 IMPLANT
MARKER SKIN DUAL TIP RULER LAB (MISCELLANEOUS) ×2 IMPLANT
PACK SHOULDER (CUSTOM PROCEDURE TRAY) ×1 IMPLANT
SCREW BONE CORTICAL 5.0X36 (Screw) ×1 IMPLANT
SCREW LAG HIP NAIL 10.5X95 (Screw) ×1 IMPLANT
STAPLER VISISTAT (STAPLE) ×1 IMPLANT
SUT MNCRL AB 3-0 PS2 18 (SUTURE) ×4 IMPLANT
SUT MON AB 2-0 CT1 36 (SUTURE) ×2 IMPLANT
SUT VIC AB 1 CT1 27 (SUTURE) ×4
SUT VIC AB 1 CT1 27XBRD ANTBC (SUTURE) ×2 IMPLANT
SUT VIC AB 2-0 CT1 27 (SUTURE) ×4
SUT VIC AB 2-0 CT1 27XBRD (SUTURE) ×2 IMPLANT
TOWEL OR 17X26 10 PK STRL BLUE (TOWEL DISPOSABLE) ×2 IMPLANT
TOWEL OR NON WOVEN STRL DISP B (DISPOSABLE) ×2 IMPLANT
TRAY FOLEY CATH 14FRSI W/METER (CATHETERS) ×1 IMPLANT
YANKAUER SUCT BULB TIP NO VENT (SUCTIONS) ×2 IMPLANT

## 2018-08-07 NOTE — Op Note (Signed)
OPERATIVE REPORT  SURGEON: Rod Can, MD   ASSISTANT: Staff.  PREOPERATIVE DIAGNOSIS: Left intertrochanteric femur fracture.   POSTOPERATIVE DIAGNOSIS: Left intertrochanteric femur fracture.   PROCEDURE: Intramedullary fixation, Left femur.   IMPLANTS: Biomet Affixus Hip Fracture Nail, 11 by 180 mm, 125 degrees. 10.5 x 95 mm Hip Fracture Nail Lag Screw. 5 x 36 mm distal interlocking screw 1.  ANESTHESIA:  MAC and Spinal  ESTIMATED BLOOD LOSS:-50 mL    ANTIBIOTICS: 2 g Ancef.  DRAINS: None.  COMPLICATIONS: None.   CONDITION: PACU - hemodynamically stable.Marland Kitchen   BRIEF CLINICAL NOTE: Andrea Wilkinson is a 85 y.o. female who presented with an intertrochanteric femur fracture. The patient was admitted to the hospitalist service and underwent perioperative risk stratification and medical optimization. The risks, benefits, and alternatives to the procedure were explained, and the patient elected to proceed.  PROCEDURE IN DETAIL: Surgical site was marked by myself. The patient was taken to the operating room and anesthesia was induced on the bed. The patient was then transferred to the [facility] table and the nonoperative lower extremity was scissored underneath the operative side. The fracture was reduced with traction, internal rotation, and adduction. The hip was prepped and draped in the normal sterile surgical fashion. Timeout was called verifying side and site of surgery. Preop antibiotics were given with 60 minutes of beginning the procedure.  Fluoroscopy was used to define the patient's anatomy. A 4 cm incision was made just proximal to the tip of the greater trochanter. The awl was used to obtain the standard starting point for a trochanteric entry nail under fluoroscopic control. The guidepin was placed. The entry reamer was used to open the proximal femur.  On the back table, the nail was assembled onto the jig. The nail was placed into the femur without any difficulty. Through a  separate stab incision, the cannula was placed down to the bone in preparation for the cephalomedullary device. A guidepin was placed into the femoral head using AP and lateral fluoroscopy views. The pin was measured, and then reaming was performed to the appropriate depth. The lag screw was inserted to the appropriate depth. The fracture was compressed through the jig. The setscrew was tightened and then loosened one quarter turn. A separate stab incision was created, and the distal interlocking screw was placed using standard AO technique. The jig was removed. Final AP and lateral fluoroscopy views were obtained to confirm fracture reduction and hardware placement. Tip apex distance was appropriate. There was no chondral penetration.  The wounds were copiously irrigated with saline. The wound was closed in layers with #1 Vicryl for the fascia, 2-0 Monocryl for the deep dermal layer, and staples for the skin. Glue was applied to the skin. Once the glue was fully hardened, sterile dressing was applied. The patient was then awakened from anesthesia and taken to the PACU in stable condition. Sponge needle and instrument counts were correct at the end of the case 2. There were no known complications.  We will readmit the patient to the hospitalist. Weightbearing status will be weightbearing astolerated with a walker. We will begin Lovenox for DVT prophylaxis. The patient will work with physical therapy and undergo disposition planning.

## 2018-08-07 NOTE — H&P (Signed)
History and Physical    Andrea Wilkinson PYP:[id number] DOB: Dec 03, 1931 DOA: 08/06/2018  Referring MD/NP/PA: EDP PCP:  Patient coming from: Home  Chief Complaint: Fall, hip pain  HPI: Andrea Wilkinson is a 85 y.o. female with medical history significant of diabetes mellitus on insulin, CAD, CABG in 03/2016, chronic kidney disease stage III, hypertension was in her usual state of health yesterday, in the afternoon got up to go to the bathroom subsequently fell and lost her balance.  She did not lose consciousness, denies any dizziness lightheadedness chest pain palpitations or dyspnea.  Has prior history of falls, uses a cane occasionally. ED Course: In the emergency room she was noted to have left hip fracture, left leg was placed in an immobilizer, orthopedics consulted, she was given 75 mcg of fentanyl followed by mild hypoxia, resolved  Review of Systems: As per HPI otherwise 14 point review of systems negative.   Past Medical History:  Diagnosis Date  . CAD (coronary artery disease)    a. s/p CABG 03/2016.  Marland Kitchen Cancer ([place])    basal cell cancer on face  . Cervical spine arthritis   . CKD (chronic kidney disease), stage III ([place])   . Depression   . Diabetic retinopathy ([place])   . DJD (degenerative joint disease)   . DM (diabetes mellitus), type 2 ([place])    type 2  . Esophageal stricture   . GERD (gastroesophageal reflux disease)   . History of hiatal hernia   . Hypertension   . Memory loss   . Mild aortic insufficiency   . Mild mitral regurgitation   . Myocardial infarction ([place])   . Obesity   . Postoperative atrial fibrillation ([place]) 2017  . PVD (peripheral vascular disease) ([place])    a. mod disease by R ABI, 1-39% BICA.    Past Surgical History:  Procedure Laterality Date  . ABDOMINAL HYSTERECTOMY    . AIR/FLUID EXCHANGE Left 08/10/2015   Procedure: AIR/FLUID EXCHANGE, left eye;  Surgeon: Hayden Pedro, MD;  Location: [place];  Service: Ophthalmology;  Laterality: Left;  .  benign tumor removed from left hip    . CARDIAC CATHETERIZATION N/A 03/22/2016   Procedure: Left Heart Cath and Coronary Angiography;  Surgeon: Jettie Booze, MD;  Location: [place] [facility];  Service: Cardiovascular;  Laterality: N/A;  . CATARACT EXTRACTION, BILATERAL  July 2013   Left done on 12/18/11 and right done on 01/15/2012  . CHOLECYSTECTOMY    . CORONARY ARTERY BYPASS GRAFT N/A 03/24/2016   Procedure: CORONARY ARTERY BYPASS GRAFTING (CABG) x 4 utilizing left internal mammary artery and left greater saphenous vein, bilateral saphenous veins harvested endoscopicaly,  SVG to PD, LIMA  to LAD, SEQ SVG to OM1 and OM2;  Surgeon: Grace Isaac, MD;  Location: [place];  Service: Open Heart Surgery;  Laterality: N/A;  . DILATION AND CURETTAGE OF UTERUS    . LASER PHOTO ABLATION Left 08/10/2015   Procedure: LASER PHOTO ABLATION, left eye;  Surgeon: Hayden Pedro, MD;  Location: [place];  Service: Ophthalmology;  Laterality: Left;  . left breast lumpectomy    . MEMBRANE PEEL Left 08/10/2015   Procedure: MEMBRANE PEEL, left eye;  Surgeon: Hayden Pedro, MD;  Location: [place];  Service: Ophthalmology;  Laterality: Left;  . PARS PLANA VITRECTOMY Left 08/10/2015   Procedure: PARS PLANA VITRECTOMY WITH 25 GAUGE, headscope laser;  Surgeon: Hayden Pedro, MD;  Location: [place];  Service: Ophthalmology;  Laterality: Left;  .  TEE WITHOUT CARDIOVERSION N/A 03/24/2016   Procedure: TRANSESOPHAGEAL ECHOCARDIOGRAM (TEE);  Surgeon: Grace Isaac, MD;  Location: [place];  Service: Open Heart Surgery;  Laterality: N/A;  . TOTAL KNEE ARTHROPLASTY     left Jan '12; right May '12 - by Dr. Alvan Dame  . TUBAL LIGATION       reports that she has never smoked. She has never used smokeless tobacco. She reports that she does not drink alcohol or use drugs.  Allergies  Allergen Reactions  . Codeine Nausea And Vomiting    Family History  Problem Relation Age of Onset  . Diabetes Father      Prior to Admission  medications   Medication Sig Start Date End Date Taking? Authorizing Provider  aspirin EC 81 MG EC tablet Take 1 tablet (81 mg total) by mouth daily. 03/30/16  Yes Gold, Wayne E, PA-C  Insulin Degludec (TRESIBA FLEXTOUCH) 200 UNIT/ML SOPN Inject 20 Units into the skin daily. 05/03/18  Yes Janith Lima, MD  Omega-3 Fatty Acids (EQL FISH OIL PO) Take 1 tablet by mouth daily.   Yes [provider]  Alcohol Swabs (B-D SINGLE USE SWABS REGULAR) PADS  05/13/18   [provider]  atorvastatin (LIPITOR) 10 MG tablet Take 1 tablet (10 mg total) by mouth daily. Patient not taking: Reported on 08/07/2018 05/21/18 08/19/18  Charlie Pitter, PA-C  Blood Glucose Calibration (ACCU-CHEK AVIVA) SOLN  05/13/18   [provider]  Blood Glucose Monitoring Suppl (ACCU-CHEK AVIVA PLUS) W/DEVICE KIT Use to check blood sugars daily 01/07/14   Janith Lima, MD  glucose blood test strip 1 each by Other route 2 (two) times daily. DX: E11.8 05/13/18   Janith Lima, MD  Lancets (ACCU-CHEK SOFT TOUCH) lancets Use to check blood sugar bid. DX: E11.8 05/13/18   Janith Lima, MD  omeprazole (PRILOSEC) 20 MG capsule TAKE 1 CAPSULE EVERY DAY Patient not taking: Reported on 08/07/2018 11/19/17   Janith Lima, MD  traMADol (ULTRAM) 50 MG tablet TAKE 1 TABLET BY MOUTH EVERY 6 HOURS AS NEEDED FOR MODERATE PAIN Patient not taking: Reported on 08/07/2018 07/17/18   Janith Lima, MD    Physical Exam: Vitals:   08/07/18 0700 08/07/18 0730 08/07/18 0800 08/07/18 0830  BP: (!) 179/89 (!) 171/86 (!) 175/102 (!) 152/88  Pulse: (!) 102 91 92 88  Resp: _0 Temp:      TempSrc:      SpO2: 99% 98% 99% 99%  Weight:      Height:          Constitutional: NAD, calm, comfortable, alert awake oriented x3, no distress Vitals:   08/07/18 0700 08/07/18 0730 08/07/18 0800 08/07/18 0830  BP: (!) 179/89 (!) 171/86 (!) 175/102 (!) 152/88  Pulse: (!) 102 91 92 88  Resp: _1 Temp:        TempSrc:      SpO2: 99% 98% 99% 99%  Weight:      Height:       Eyes: PERRL, lids and conjunctivae normal ENMT: Mucous membranes are moist.  Neck: normal, supple Respiratory: Clear bilaterally, decreased at both bases respiratory effort. No accessory muscle use.  Cardiovascular: Regular rate and rhythm, no murmurs / rubs / gallops Abdomen: soft, non tender, Bowel sounds positive.  Musculoskeletal: Left leg externally rotated and shortened, in immobilizer Ext: No edema, as above Skin: no rashes, lesions, ulcers.  Neurologic: CN 2-12 grossly intact. Sensation  intact, DTR normal. Strength 5/5 in all 4.  Psychiatric: Normal judgment and insight. Alert and oriented x 3. Normal mood.   Labs on Admission: I have personally reviewed following labs and imaging studies  CBC: Recent Labs  Lab 08/07/18 0109  WBC 11.0*  NEUTROABS 9.7*  HGB 11.5*  HCT 36.8  MCV 94.4  PLT 992   Basic Metabolic Panel: Recent Labs  Lab 08/07/18 0109  NA 135  K 4.1  CL 100  CO2 25  GLUCOSE 304*  BUN 38*  CREATININE 1.63*  CALCIUM 9.0   GFR: Estimated Creatinine Clearance: 22.8 mL/min (A) (by C-G formula based on SCr of 1.63 mg/dL (H)). Liver Function Tests: No results for input(s): AST, ALT, ALKPHOS, BILITOT, PROT, ALBUMIN in the last 168 hours. No results for input(s): LIPASE, AMYLASE in the last 168 hours. No results for input(s): AMMONIA in the last 168 hours. Coagulation Profile: Recent Labs  Lab 08/07/18 0109  INR 1.08   Cardiac Enzymes: No results for input(s): CKTOTAL, CKMB, CKMBINDEX, TROPONINI in the last 168 hours. BNP (last 3 results) No results for input(s): PROBNP in the last 8760 hours. HbA1C: No results for input(s): HGBA1C in the last 72 hours. CBG: No results for input(s): GLUCAP in the last 168 hours. Lipid Profile: No results for input(s): CHOL, HDL, LDLCALC, TRIG, CHOLHDL, LDLDIRECT in the last 72 hours. Thyroid Function Tests: No results for input(s): TSH,  T4TOTAL, FREET4, T3FREE, THYROIDAB in the last 72 hours. Anemia Panel: No results for input(s): VITAMINB12, FOLATE, FERRITIN, TIBC, IRON, RETICCTPCT in the last 72 hours. Urine analysis:    Component Value Date/Time   COLORURINE STRAW (A) 08/07/2018 0007   APPEARANCEUR CLEAR 08/07/2018 0007   LABSPEC 1.011 08/07/2018 0007   PHURINE 7.0 08/07/2018 0007   GLUCOSEU 150 (A) 08/07/2018 0007   GLUCOSEU NEGATIVE 05/01/2018 1226   HGBUR NEGATIVE 08/07/2018 0007   HGBUR negative 11/05/2009 0839   BILIRUBINUR NEGATIVE 08/07/2018 0007   BILIRUBINUR negative 05/01/2013 1044   KETONESUR NEGATIVE 08/07/2018 0007   PROTEINUR NEGATIVE 08/07/2018 0007   UROBILINOGEN 0.2 05/01/2018 1226   NITRITE NEGATIVE 08/07/2018 0007   LEUKOCYTESUR NEGATIVE 08/07/2018 0007   Sepsis Labs: _0 (procalcitonin:4,lacticidven:4) )No results found for this or any previous visit (from the past 240 hour(s)).   Radiological Exams on Admission: Ct Head Wo Contrast  Result Date: 08/07/2018 CLINICAL DATA:  85 year old female status post fall with head trauma. EXAM: CT HEAD WITHOUT CONTRAST TECHNIQUE: Contiguous axial images were obtained from the base of the skull through the vertex without intravenous contrast. COMPARISON:  Head and cervical spine CT 06/11/2015. FINDINGS: Brain: Stable cerebral volume, normal for age. Chronic Patchy and confluent cerebral white matter hypodensity appears stable. No midline shift, ventriculomegaly, mass effect, evidence of mass lesion, intracranial hemorrhage or evidence of cortically based acute infarction. Vascular: Extensive calcified intracranial artery atherosclerosis with superimposed arterial ectasia. Skull: No acute osseous abnormality identified. Sinuses/Orbits: Visualized paranasal sinuses and mastoids are stable and well pneumatized. Other: No scalp hematoma identified. Stable orbits. IMPRESSION: 1. No acute intracranial abnormality or acute traumatic injury identified. 2. 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 Electronically Signed   By: Genevie Ann M.D.   On: 08/07/2018 00:30   Dg Hip Unilat W Or W/o Pelvis 2-3 Views Left  Result Date: 08/07/2018 CLINICAL DATA:  Left hip pain EXAM: DG HIP (WITH OR WITHOUT PELVIS) 2-3V LEFT COMPARISON:  None. FINDINGS: There is a medially displaced fracture of the lesser trochanter of  the left femur. There is lucency extending along the intertrochanteric line without displacement. Cross-table lateral views are limited by patient body habitus. No pelvic ring fracture. IMPRESSION: Medially displaced fracture of the left femur lesser trochanter with lucency along the intertrochanteric line possibly indicating nondisplaced intertrochanteric fracture. Electronically Signed   By: Ulyses Jarred M.D.   On: 08/07/2018 00:58    EKG: Independently reviewed.  Sinus rhythm, T wave inversion in lateral leads, unchanged from prior  Assessment/Plan Principal Problem:    Hip fracture due to osteoporosis, initial encounter [facility]) -Following a mechanical fall -[facility] consulted per EDP, await consult & recommendations -We will leave her n.p.o. for now -Patient is moderate cardiac risk for an intermediate risk procedure, no chest pain or dyspnea on exertion since her CABG in 2017 -No further cardiac work-up needed at this time, beta-blocker resumed -Please order DVT prophylaxis postop  Diabetes mellitus -Patient takes [place] 20 units in the morning, will decrease dose to half while n.p.o. -Adding scale insulin, check hemoglobin A1c  History of CAD/CABG -In 10/201 7 -stable, resume aspirin, metoprolol and statin  Hypertension  -BP slightly elevated in the setting of hip fracture and pain -Restarted metoprolol and amlodipine  Chronic kidney disease stage III -Stable at baseline, monitor  Please review patient's home medications again, per last PCP note she is on  metoprolol but this was not listed, I restarted it, needs further clarification  DVT prophylaxis: SCDs, please order DVT prophylaxis postop Code Status: Full code Family Communication: No family at bedside Disposition Plan: Will likely need SNF Consults called: [facility] per EDP Admission status: Inpatient  Domenic Polite MD [facility]   08/07/2018, 8:47 AM

## 2018-08-07 NOTE — Consult Note (Signed)
ORTHOPAEDIC CONSULTATION  REQUESTING PHYSICIAN: Domenic Polite, MD  PCP:  Janith Lima, MD  Chief Complaint: Left intertrochanteric femur fracture.  HPI: Andrea Wilkinson is a 85 y.o. female who complains of left hip pain after she fell at home yesterday.  She was unable to weight-bear.  She was brought to the emergency department at [facility], where x-rays and a CT scan of the left hip revealed a mildly displaced, comminuted intertrochanteric femur fracture.  She was admitted to the hospital by the hospitalist service for perioperative risk gratification and medical optimization.  Orthopedic consultation was placed for management of her left hip fracture.  She denies other injuries.  Past Medical History:  Diagnosis Date  . CAD (coronary artery disease)    a. s/p CABG 03/2016.  Marland Kitchen Cancer ([place])    basal cell cancer on face  . Cervical spine arthritis   . CKD (chronic kidney disease), stage III ([place])   . Depression   . Diabetic retinopathy ([place])   . DJD (degenerative joint disease)   . DM (diabetes mellitus), type 2 ([place])    type 2  . Esophageal stricture   . GERD (gastroesophageal reflux disease)   . History of hiatal hernia   . Hypertension   . Memory loss   . Mild aortic insufficiency   . Mild mitral regurgitation   . Myocardial infarction ([place])   . Obesity   . Postoperative atrial fibrillation ([place]) 2017  . PVD (peripheral vascular disease) ([place])    a. mod disease by R ABI, 1-39% BICA.   Past Surgical History:  Procedure Laterality Date  . ABDOMINAL HYSTERECTOMY    . AIR/FLUID EXCHANGE Left 08/10/2015   Procedure: AIR/FLUID EXCHANGE, left eye;  Surgeon: Hayden Pedro, MD;  Location: [place];  Service: Ophthalmology;  Laterality: Left;  . benign tumor removed from left hip    . CARDIAC CATHETERIZATION N/A 03/22/2016   Procedure: Left Heart Cath and Coronary Angiography;  Surgeon: Jettie Booze, MD;  Location: [place] [facility];  Service:  Cardiovascular;  Laterality: N/A;  . CATARACT EXTRACTION, BILATERAL  July 2013   Left done on 12/18/11 and right done on 01/15/2012  . CHOLECYSTECTOMY    . CORONARY ARTERY BYPASS GRAFT N/A 03/24/2016   Procedure: CORONARY ARTERY BYPASS GRAFTING (CABG) x 4 utilizing left internal mammary artery and left greater saphenous vein, bilateral saphenous veins harvested endoscopicaly,  SVG to PD, LIMA  to LAD, SEQ SVG to OM1 and OM2;  Surgeon: Grace Isaac, MD;  Location: [place];  Service: Open Heart Surgery;  Laterality: N/A;  . DILATION AND CURETTAGE OF UTERUS    . LASER PHOTO ABLATION Left 08/10/2015   Procedure: LASER PHOTO ABLATION, left eye;  Surgeon: Hayden Pedro, MD;  Location: [place];  Service: Ophthalmology;  Laterality: Left;  . left breast lumpectomy    . MEMBRANE PEEL Left 08/10/2015   Procedure: MEMBRANE PEEL, left eye;  Surgeon: Hayden Pedro, MD;  Location: [place];  Service: Ophthalmology;  Laterality: Left;  . PARS PLANA VITRECTOMY Left 08/10/2015   Procedure: PARS PLANA VITRECTOMY WITH 25 GAUGE, headscope laser;  Surgeon: Hayden Pedro, MD;  Location: [place];  Service: Ophthalmology;  Laterality: Left;  . TEE WITHOUT CARDIOVERSION N/A 03/24/2016   Procedure: TRANSESOPHAGEAL ECHOCARDIOGRAM (TEE);  Surgeon: Grace Isaac, MD;  Location: [place];  Service: Open Heart Surgery;  Laterality: N/A;  . TOTAL KNEE ARTHROPLASTY     left Jan '12;  right May '12 - by Dr. Alvan Dame  . TUBAL LIGATION     Social History   Socioeconomic History  . Marital status: Widowed    Spouse name: Not on file  . Number of children: 4  . Years of education: Not on file  . Highest education level: Not on file  Occupational History  . Occupation: retired  Scientific laboratory technician  . Financial resource strain: Not on file  . Food insecurity:    Worry: Not on file    Inability: Not on file  . Transportation needs:    Medical: Not on file    Non-medical: Not on file  Tobacco Use  . Smoking status: Never Smoker  .  Smokeless tobacco: Never Used  Substance and Sexual Activity  . Alcohol use: No  . Drug use: No  . Sexual activity: Not Currently  Lifestyle  . Physical activity:    Days per week: Not on file    Minutes per session: Not on file  . Stress: Not on file  Relationships  . Social connections:    Talks on phone: Not on file    Gets together: Not on file    Attends religious service: Not on file    Active member of club or organization: Not on file    Attends meetings of clubs or organizations: Not on file    Relationship status: Not on file  Other Topics Concern  . Not on file  Social History Narrative   Married [phone]   5 sons- '56, '57, '59, '62, '70   Grandchildren 10, 6 great grandchildren   Lives in own home, son lives with her. I- ADLs   Discussed end of life care: would not want resuscitation or being maintained with mechanical ventilation.   Daily caffeine use 1 cup of coffee per day   Family History  Problem Relation Age of Onset  . Diabetes Father    Allergies  Allergen Reactions  . Codeine Nausea And Vomiting   Prior to Admission medications   Medication Sig Start Date End Date Taking? Authorizing Provider  aspirin EC 81 MG EC tablet Take 1 tablet (81 mg total) by mouth daily. 03/30/16  Yes Gold, Wayne E, PA-C  Insulin Degludec (TRESIBA FLEXTOUCH) 200 UNIT/ML SOPN Inject 20 Units into the skin daily. 05/03/18  Yes Janith Lima, MD  Omega-3 Fatty Acids (EQL FISH OIL PO) Take 1 tablet by mouth daily.   Yes [provider]  Alcohol Swabs (B-D SINGLE USE SWABS REGULAR) PADS  05/13/18   [provider]  atorvastatin (LIPITOR) 10 MG tablet Take 1 tablet (10 mg total) by mouth daily. Patient not taking: Reported on 08/07/2018 05/21/18 08/19/18  Charlie Pitter, PA-C  Blood Glucose Calibration (ACCU-CHEK AVIVA) SOLN  05/13/18   [provider]  Blood Glucose Monitoring Suppl (ACCU-CHEK AVIVA PLUS) W/DEVICE KIT Use to check blood sugars daily  01/07/14   Janith Lima, MD  glucose blood test strip 1 each by Other route 2 (two) times daily. DX: E11.8 05/13/18   Janith Lima, MD  Lancets (ACCU-CHEK SOFT TOUCH) lancets Use to check blood sugar bid. DX: E11.8 05/13/18   Janith Lima, MD  omeprazole (PRILOSEC) 20 MG capsule TAKE 1 CAPSULE EVERY DAY Patient not taking: Reported on 08/07/2018 11/19/17   Janith Lima, MD  traMADol (ULTRAM) 50 MG tablet TAKE 1 TABLET BY MOUTH EVERY 6 HOURS AS NEEDED FOR MODERATE PAIN Patient not taking: Reported on 08/07/2018 07/17/18  Janith Lima, MD   Ct Head Wo Contrast  Result Date: 08/07/2018 CLINICAL DATA:  85 year old female status post fall with head trauma. EXAM: CT HEAD WITHOUT CONTRAST TECHNIQUE: Contiguous axial images were obtained from the base of the skull through the vertex without intravenous contrast. COMPARISON:  Head and cervical spine CT 06/11/2015. FINDINGS: Brain: Stable cerebral volume, normal for age. Chronic Patchy and confluent cerebral white matter hypodensity appears stable. No midline shift, ventriculomegaly, mass effect, evidence of mass lesion, intracranial hemorrhage or evidence of cortically based acute infarction. Vascular: Extensive calcified intracranial artery atherosclerosis with superimposed arterial ectasia. Skull: No acute osseous abnormality identified. Sinuses/Orbits: Visualized paranasal sinuses and mastoids are stable and well pneumatized. Other: No scalp hematoma identified. Stable orbits. IMPRESSION: 1. No acute intracranial abnormality or acute traumatic injury identified. 2. 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 Electronically Signed   By: Genevie Ann M.D.   On: 08/07/2018 00:30   Ct Hip Left Wo Contrast  Result Date: 08/07/2018 CLINICAL DATA:  Left hip pain since a fall when the patient stood up yesterday. Initial encounter. EXAM: CT OF THE LEFT HIP WITHOUT CONTRAST TECHNIQUE: Multidetector CT  imaging of the left hip was performed according to the standard protocol. Multiplanar CT image reconstructions were also generated. COMPARISON:  Plain films left hip 08/07/2018. FINDINGS: Bones/Joint/Cartilage As seen on the comparison plain films, the patient has a mildly comminuted intertrochanteric fracture of the left hip. The lesser trochanter is a separate fragment and medially displaced approximately 1 cm. There is mild varus angulation about the fracture. No other fracture is identified. There is some joint space narrowing about the left hip with subchondral sclerosis. The superior and anterior labrum are calcified. Ligaments Suboptimally assessed by CT. Muscles and Tendons Intact. Calcifications at the left hamstring origin consistent with chronic tendinosis noted. Soft tissues Imaged intrapelvic contents are unremarkable. IMPRESSION: Mildly comminuted left intertrochanteric fracture as described above. Electronically Signed   By: Inge Rise M.D.   On: 08/07/2018 09:53   Dg Knee Left Port  Result Date: 08/07/2018 CLINICAL DATA:  Preop. EXAM: PORTABLE LEFT KNEE - 1-2 VIEW COMPARISON:  None. FINDINGS: The patient is status post TKR. Additional lag screw has been placed through the distal femur. No fracture or loosening. No effusion. Vascular calcification. IMPRESSION: No active disease. Electronically Signed   By: Staci Righter M.D.   On: 08/07/2018 09:25   Dg Hip Unilat W Or W/o Pelvis 2-3 Views Left  Result Date: 08/07/2018 CLINICAL DATA:  Left hip pain EXAM: DG HIP (WITH OR WITHOUT PELVIS) 2-3V LEFT COMPARISON:  None. FINDINGS: There is a medially displaced fracture of the lesser trochanter of the left femur. There is lucency extending along the intertrochanteric line without displacement. Cross-table lateral views are limited by patient body habitus. No pelvic ring fracture. IMPRESSION: Medially displaced fracture of the left femur lesser trochanter with lucency along the intertrochanteric  line possibly indicating nondisplaced intertrochanteric fracture. Electronically Signed   By: Ulyses Jarred M.D.   On: 08/07/2018 00:58    Positive ROS: All other systems have been reviewed and were otherwise negative with the exception of those mentioned in the HPI and as above.  Physical Exam: General: Alert, no acute distress Cardiovascular: No pedal edema Respiratory: No cyanosis, no use of accessory musculature GI: No organomegaly, abdomen is soft and non-tender Skin: No lesions in the area of chief complaint Neurologic: Sensation intact distally Psychiatric: Patient is competent for consent with normal mood and  affect Lymphatic: No axillary or cervical lymphadenopathy  MUSCULOSKELETAL: Examination of the left hip reveals no skin wounds or lesions.  She is shortened and rotated.  She has pain with logrolling of the hip.  She has palpable pulses.  She has positive motor function dorsiflexion, plantarflexion, and great toe extension.  She has palpable pulses.  She has positive motor function dorsiflexion, plantar flexion, and great toe extension.  She reports intact sensation.  Assessment: Comminuted left intertrochanteric femur fracture.  Plan: I discussed the findings with the patient.  She has an unstable, displaced intertrochanteric fracture.  She requires surgical stabilization to allow for pain control and mobilization.  We discussed the risk, benefits, and alternatives to intramedullary fixation of her left intertrochanteric femur fracture.  We will plan for surgery today.  Continue n.p.o.  Hold chemical DVT prophylaxis until postoperative period.  All questions were solicited and answered.  The risks, benefits, and alternatives were discussed with the patient. There are risks associated with the surgery including, but not limited to, problems with anesthesia (death), infection, differences in leg length/angulation/rotation, fracture of bones, loosening or failure of implants,  malunion, nonunion, hematoma (blood accumulation) which may require surgical drainage, blood clots, pulmonary embolism, nerve injury (foot drop), and blood vessel injury. The patient understands these risks and elects to proceed.   Bertram Savin, MD [facility] [phone]    08/07/2018 11:33 AM

## 2018-08-07 NOTE — Anesthesia Procedure Notes (Signed)
Procedure Name: MAC Date/Time: 08/07/2018 6:14 PM Performed by: West Pugh, CRNA Pre-anesthesia Checklist: Patient identified, Emergency Drugs available, Suction available, Patient being monitored and Timeout performed Patient Re-evaluated:Patient Re-evaluated prior to induction Oxygen Delivery Method: Simple face mask Preoxygenation: Pre-oxygenation with 100% oxygen Induction Type: IV induction Number of attempts: 1 Placement Confirmation: positive ETCO2 Dental Injury: Teeth and Oropharynx as per pre-operative assessment

## 2018-08-07 NOTE — Transfer of Care (Signed)
Immediate Anesthesia Transfer of Care Note  Patient: Andrea Wilkinson  Procedure(s) Performed: INTRAMEDULLARY (IM) NAIL FEMORAL LEFT (N/A )  Patient Location: PACU  Anesthesia Type:MAC and Spinal  Level of Consciousness: awake, alert  and patient cooperative  Airway & Oxygen Therapy: Patient Spontanous Breathing and Patient connected to face mask oxygen  Post-op Assessment: Report given to RN and Post -op Vital signs reviewed and stable  Post vital signs: Reviewed and stable  Last Vitals:  Vitals Value Taken Time  BP 98/49 08/07/2018  7:53 PM  Temp    Pulse 69 08/07/2018  8:00 PM  Resp 11 08/07/2018  8:00 PM  SpO2 97 % 08/07/2018  8:00 PM  Vitals shown include unvalidated device data.  Last Pain:  Vitals:   08/07/18 1512  TempSrc: Oral  PainSc:       Patients Stated Pain Goal: 0 (07/86/75 4492)  Complications: No apparent anesthesia complications

## 2018-08-07 NOTE — Anesthesia Postprocedure Evaluation (Signed)
Anesthesia Post Note  Patient: Andrea Wilkinson  Procedure(s) Performed: INTRAMEDULLARY (IM) NAIL FEMORAL LEFT (N/A )     Patient location during evaluation: PACU Anesthesia Type: General Level of consciousness: awake and alert Pain management: pain level controlled Vital Signs Assessment: post-procedure vital signs reviewed and stable Respiratory status: spontaneous breathing, nonlabored ventilation and respiratory function stable Cardiovascular status: blood pressure returned to baseline and stable Postop Assessment: no apparent nausea or vomiting Anesthetic complications: no    Last Vitals:  Vitals:   08/07/18 2030 08/07/18 2045  BP: 119/61   Pulse: 77 81  Resp: 12 12  Temp:    SpO2: 100%     Last Pain:  Vitals:   08/07/18 2015  TempSrc:   PainSc: Asleep                 Lynda Rainwater

## 2018-08-07 NOTE — ED Notes (Signed)
ED TO INPATIENT HANDOFF REPORT  Name/Age/Gender Andrea Wilkinson 85 y.o. female  Code Status    Code Status Orders  (From admission, onward)         Start     Ordered   08/07/18 0848  Full code  Continuous     08/07/18 0847        Code Status History    Date Active Date Inactive Code Status Order ID Comments User Context   03/22/2016 1207 03/30/2016 1725 Full Code [id number]  [facility]      Home/SNF/Other Skilled nursing facility  Chief Complaint Fall  Level of Care/Admitting Diagnosis ED Disposition    ED Disposition Condition [place]: [facility] [[id number]]  Level of Care: Med-Surg [16]  Diagnosis: Hip fracture [facility]) [[id number]]  Admitting Physician: Domenic Polite [[id number]]  Attending Physician: Domenic Polite [[id number]]  Estimated length of stay: past midnight tomorrow  Certification:: I certify this patient will need inpatient services for at least 2 midnights  PT Class (Do Not Modify): Inpatient [101]  PT Acc Code (Do Not Modify): Private [1]       Medical History Past Medical History:  Diagnosis Date  . CAD (coronary artery disease)    a. s/p CABG 03/2016.  Marland Kitchen Cancer ([place])    basal cell cancer on face  . Cervical spine arthritis   . CKD (chronic kidney disease), stage III ([place])   . Depression   . Diabetic retinopathy ([place])   . DJD (degenerative joint disease)   . DM (diabetes mellitus), type 2 ([place])    type 2  . Esophageal stricture   . GERD (gastroesophageal reflux disease)   . History of hiatal hernia   . Hypertension   . Memory loss   . Mild aortic insufficiency   . Mild mitral regurgitation   . Myocardial infarction ([place])   . Obesity   . Postoperative atrial fibrillation ([place]) 2017  . PVD (peripheral vascular disease) ([place])    a. mod disease by R ABI, 1-39% BICA.    Allergies Allergies  Allergen Reactions  . Codeine Nausea And Vomiting    IV Location/Drains/Wounds Patient  Lines/Drains/Airways Status   Active Line/Drains/Airways    Name:   Placement date:   Placement time:   Site:   Days:   Peripheral IV 08/07/18 Right Hand   08/07/18    -    Hand   less than 1   Incision (Closed) 03/24/16 Chest Other (Comment)   03/24/16    1006     866   Incision (Closed) 03/24/16 Leg Right   03/24/16    1006     866   Incision (Closed) 03/24/16 Leg Left   03/24/16    1021     866          Labs/Imaging Results for orders placed or performed during the hospital encounter of 08/06/18 (from the past 48 hour(s))  Urinalysis, Routine w reflex microscopic     Status: Abnormal   Collection Time: 08/07/18 12:07 AM  Result Value Ref Range   Color, Urine STRAW (A) YELLOW   APPearance CLEAR CLEAR   Specific Gravity, Urine 1.011 1.005 - 1.030   pH 7.0 5.0 - 8.0   Glucose, UA 150 (A) NEGATIVE mg/dL   Hgb urine dipstick NEGATIVE NEGATIVE   Bilirubin Urine NEGATIVE NEGATIVE   Ketones, ur NEGATIVE NEGATIVE mg/dL   Protein, ur NEGATIVE NEGATIVE mg/dL   Nitrite NEGATIVE  NEGATIVE   Leukocytes,Ua NEGATIVE NEGATIVE    Comment: Performed at [facility], [place] [address], [place], [place] [address]  CBC with Differential/Platelet     Status: Abnormal   Collection Time: 08/07/18  1:09 AM  Result Value Ref Range   WBC 11.0 (H) 4.0 - 10.5 K/uL   RBC 3.90 3.87 - 5.11 MIL/uL   Hemoglobin 11.5 (L) 12.0 - 15.0 g/dL   HCT 36.8 36.0 - 46.0 %   MCV 94.4 80.0 - 100.0 fL   MCH 29.5 26.0 - 34.0 pg   MCHC 31.3 30.0 - 36.0 g/dL   RDW 12.4 11.5 - 15.5 %   Platelets 199 150 - 400 K/uL   nRBC 0.0 0.0 - 0.2 %   Neutrophils Relative % 89 %   Neutro Abs 9.7 (H) 1.7 - 7.7 K/uL   Lymphocytes Relative 7 %   Lymphs Abs 0.8 0.7 - 4.0 K/uL   Monocytes Relative 3 %   Monocytes Absolute 0.4 0.1 - 1.0 K/uL   Eosinophils Relative 1 %   Eosinophils Absolute 0.1 0.0 - 0.5 K/uL   Basophils Relative 0 %   Basophils Absolute 0.0 0.0 - 0.1 K/uL   Immature Granulocytes 0 %   Abs Immature  Granulocytes 0.03 0.00 - 0.07 K/uL    Comment: Performed at [facility], [place] [address] [place], [place] [address]  Basic metabolic panel     Status: Abnormal   Collection Time: 08/07/18  1:09 AM  Result Value Ref Range   Sodium 135 135 - 145 mmol/L   Potassium 4.1 3.5 - 5.1 mmol/L   Chloride 100 98 - 111 mmol/L   CO2 25 22 - 32 mmol/L   Glucose, Bld 304 (H) 70 - 99 mg/dL   BUN 38 (H) 8 - 23 mg/dL   Creatinine, Ser 1.63 (H) 0.44 - 1.00 mg/dL   Calcium 9.0 8.9 - 10.3 mg/dL   GFR calc non Af Amer 29 (L) >60 mL/min   GFR calc Af Amer 33 (L) >60 mL/min   Anion gap 10 5 - 15    Comment: Performed at [facility], [place] [address] [place], [place] [address]  Protime-INR     Status: None   Collection Time: 08/07/18  1:09 AM  Result Value Ref Range   Prothrombin Time 13.9 11.4 - 15.2 seconds   INR 1.08     Comment: Performed at [facility], [place] [address] [place], [place] [address]  I-Stat Troponin, ED (not at [facility])     Status: None   Collection Time: 08/07/18  1:16 AM  Result Value Ref Range   Troponin i, poc 0.01 0.00 - 0.08 ng/mL   Comment 3            Comment: Due to the release kinetics of cTnI, a negative result within the first hours of the onset of symptoms does not rule out myocardial infarction with certainty. If myocardial infarction is still suspected, repeat the test at appropriate intervals.    Ct Head Wo Contrast  Result Date: 08/07/2018 CLINICAL DATA:  85 year old female status post fall with head trauma. EXAM: CT HEAD WITHOUT CONTRAST TECHNIQUE: Contiguous axial images were obtained from the base of the skull through the vertex without intravenous contrast. COMPARISON:  Head and cervical spine CT 06/11/2015. FINDINGS: Brain: Stable cerebral volume, normal for age. Chronic Patchy and confluent cerebral white matter hypodensity appears stable. No midline shift, ventriculomegaly, mass effect, evidence of mass  lesion, intracranial hemorrhage or evidence of cortically based acute infarction. Vascular: Extensive calcified intracranial artery atherosclerosis with superimposed arterial ectasia. Skull: No acute osseous abnormality identified. Sinuses/Orbits: Visualized paranasal sinuses and mastoids are stable and well pneumatized. Other: No scalp hematoma identified. Stable orbits. IMPRESSION: 1. No acute intracranial abnormality or acute traumatic injury identified. 2. 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 Electronically Signed   By: Genevie Ann M.D.   On: 08/07/2018 00:30   Dg Hip Unilat W Or W/o Pelvis 2-3 Views Left  Result Date: 08/07/2018 CLINICAL DATA:  Left hip pain EXAM: DG HIP (WITH OR WITHOUT PELVIS) 2-3V LEFT COMPARISON:  None. FINDINGS: There is a medially displaced fracture of the lesser trochanter of the left femur. There is lucency extending along the intertrochanteric line without displacement. Cross-table lateral views are limited by patient body habitus. No pelvic ring fracture. IMPRESSION: Medially displaced fracture of the left femur lesser trochanter with lucency along the intertrochanteric line possibly indicating nondisplaced intertrochanteric fracture. Electronically Signed   By: Ulyses Jarred M.D.   On: 08/07/2018 00:58    Pending Labs Unresulted Labs (From admission, onward)    Start     Ordered   08/08/18 0500  CBC  Tomorrow morning,   R     08/07/18 0847   08/08/18 5277  Basic metabolic panel  Tomorrow morning,   R     08/07/18 0847   08/07/18 0848  Hemoglobin A1c  Add-on,   R     08/07/18 0847          Vitals/Pain Today's Vitals   08/07/18 0730 08/07/18 0800 08/07/18 0830 08/07/18 0834  BP: (!) 171/86 (!) 175/102 (!) 152/88   Pulse: 91 92 88   Resp: 17 13 15    Temp:      TempSrc:      SpO2: 98% 99% 99%   Weight:      Height:      PainSc:    7     Isolation Precautions No active  isolations  Medications Medications  atorvastatin (LIPITOR) tablet 10 mg (has no administration in time range)  aspirin EC tablet 81 mg (has no administration in time range)  metoprolol tartrate (LOPRESSOR) tablet 37.5 mg (has no administration in time range)  insulin glargine (LANTUS) injection 10 Units (has no administration in time range)  amLODipine (NORVASC) tablet 5 mg (has no administration in time range)  ondansetron (ZOFRAN) injection 4 mg (has no administration in time range)  fentaNYL (SUBLIMAZE) injection 75 mcg (75 mcg Intravenous Given 08/07/18 0055)  methocarbamol (ROBAXIN) 500 mg in dextrose 5 % 50 mL IVPB (0 mg Intravenous Stopped 08/07/18 0156)  fentaNYL (SUBLIMAZE) injection 75 mcg (75 mcg Intravenous Given 08/07/18 0720)    Mobility non-ambulatory

## 2018-08-07 NOTE — Discharge Instructions (Signed)
 Dr. Davarious Tumbleson Adult Hip & Knee Specialist [facility] [address] [place], [place] [address] [phone]   POSTOPERATIVE DIRECTIONS    Hip Rehabilitation, Guidelines Following Surgery   WEIGHT BEARING Weight bearing as tolerated with assist device (walker, cane, etc) as directed, use it as long as suggested by your surgeon or therapist, typically at least 4-6 weeks.   HOME CARE INSTRUCTIONS  Remove items at home which could result in a fall. This includes throw rugs or furniture in walking pathways.  Continue medications as instructed at time of discharge.  You may have some home medications which will be placed on hold until you complete the course of blood thinner medication.  4 days after discharge, you may start showering. No tub baths or soaking your incisions. Do not put on socks or shoes without following the instructions of your caregivers.   Sit on chairs with arms. Use the chair arms to help push yourself up when arising.  Arrange for the use of a toilet seat elevator so you are not sitting low.   Walk with walker as instructed.  You may resume a sexual relationship in one month or when given the OK by your caregiver.  Use walker as long as suggested by your caregivers.  Avoid periods of inactivity such as sitting longer than an hour when not asleep. This helps prevent blood clots.  You may return to work once you are cleared by your surgeon.  Do not drive a car for 6 weeks or until released by your surgeon.  Do not drive while taking narcotics.  Wear elastic stockings for two weeks following surgery during the day but you may remove then at night.  Make sure you keep all of your appointments after your operation with all of your doctors and caregivers. You should call the office at the above phone number and make an appointment for approximately two weeks after the date of your surgery. Please pick up a stool softener and laxative  for home use as long as you are requiring pain medications.  ICE to the affected hip every three hours for 30 minutes at a time and then as needed for pain and swelling. Continue to use ice on the hip for pain and swelling from surgery. You may notice swelling that will progress down to the foot and ankle.  This is normal after surgery.  Elevate the leg when you are not up walking on it.   It is important for you to complete the blood thinner medication as prescribed by your doctor.  Continue to use the breathing machine which will help keep your temperature down.  It is common for your temperature to cycle up and down following surgery, especially at night when you are not up moving around and exerting yourself.  The breathing machine keeps your lungs expanded and your temperature down.  RANGE OF MOTION AND STRENGTHENING EXERCISES  These exercises are designed to help you keep full movement of your hip joint. Follow your caregiver's or physical therapist's instructions. Perform all exercises about fifteen times, three times per day or as directed. Exercise both hips, even if you have had only one joint replacement. These exercises can be done on a training (exercise) mat, on the floor, on a table or on a bed. Use whatever works the best and is most comfortable for you. Use music or television while you are exercising so that the exercises are a pleasant break in your day. This   will make your life better with the exercises acting as a break in routine you can look forward to.  Lying on your back, slowly slide your foot toward your buttocks, raising your knee up off the floor. Then slowly slide your foot back down until your leg is straight again.  Lying on your back spread your legs as far apart as you can without causing discomfort.  Lying on your side, raise your upper leg and foot straight up from the floor as far as is comfortable. Slowly lower the leg and repeat.  Lying on your back, tighten up the  muscle in the front of your thigh (quadriceps muscles). You can do this by keeping your leg straight and trying to raise your heel off the floor. This helps strengthen the largest muscle supporting your knee.  Lying on your back, tighten up the muscles of your buttocks both with the legs straight and with the knee bent at a comfortable angle while keeping your heel on the floor.   SKILLED REHAB INSTRUCTIONS: If the patient is transferred to a skilled rehab facility following release from the hospital, a list of the current medications will be sent to the facility for the patient to continue.  When discharged from the skilled rehab facility, please have the facility set up the patient's Home Health Physical Therapy prior to being released. Also, the skilled facility will be responsible for providing the patient with their medications at time of release from the facility to include their pain medication and their blood thinner medication. If the patient is still at the rehab facility at time of the two week follow up appointment, the skilled rehab facility will also need to assist the patient in arranging follow up appointment in our office and any transportation needs.  MAKE SURE YOU:  Understand these instructions.  Will watch your condition.  Will get help right away if you are not doing well or get worse.  Pick up stool softner and laxative for home use following surgery while on pain medications. Daily dry dressing changes as needed. In 4 days, you may remove your dressings and begin taking showers - no tub baths or soaking the incisions. Continue to use ice for pain and swelling after surgery. Do not use any lotions or creams on the incision until instructed by your surgeon.   

## 2018-08-07 NOTE — Anesthesia Preprocedure Evaluation (Addendum)
Anesthesia Evaluation  Patient identified by MRN, date of birth, ID band Patient awake    Reviewed: Allergy & Precautions, NPO status , Patient's Chart, lab work & pertinent test results  Airway Mallampati: II  TM Distance: >3 FB     Dental  (+) Edentulous Upper, Edentulous Lower   Pulmonary    breath sounds clear to auscultation       Cardiovascular hypertension, + angina + CAD and + Past MI   Rhythm:Regular Rate:Normal     Neuro/Psych PSYCHIATRIC DISORDERS Depression  Neuromuscular disease    GI/Hepatic hiatal hernia, GERD  ,  Endo/Other  diabetes  Renal/GU Renal disease     Musculoskeletal   Abdominal   Peds  Hematology   Anesthesia Other Findings   Reproductive/Obstetrics                             Anesthesia Physical  Anesthesia Plan  ASA: III  Anesthesia Plan: Spinal   Post-op Pain Management:    Induction: Intravenous  PONV Risk Score and Plan: 2 and Ondansetron, Midazolam and Treatment may vary due to age or medical condition  Airway Management Planned: Oral ETT  Additional Equipment:   Intra-op Plan:   Post-operative Plan: Extubation in OR  Informed Consent: I have reviewed the patients History and Physical, chart, labs and discussed the procedure including the risks, benefits and alternatives for the proposed anesthesia with the patient or authorized representative who has indicated his/her understanding and acceptance.     Dental advisory given  Plan Discussed with: CRNA  Anesthesia Plan Comments:        Anesthesia Quick Evaluation

## 2018-08-07 NOTE — ED Notes (Addendum)
Report called to Ginger, RN with [phone] and will be transported to room with RN on 2L-[place]. Pt remains alert at this time and daughter at bedside. No complains or needs expressed on transport.

## 2018-08-07 NOTE — ED Notes (Signed)
Patient o2 sat dropped to 80 due to pain med. Patient put on 2L [place] and o2 sat is 100

## 2018-08-08 ENCOUNTER — Encounter (HOSPITAL_COMMUNITY): Payer: Self-pay | Admitting: Orthopedic Surgery

## 2018-08-08 DIAGNOSIS — M80059A Age-related osteoporosis with current pathological fracture, unspecified femur, initial encounter for fracture: Secondary | ICD-10-CM

## 2018-08-08 DIAGNOSIS — S72142A Displaced intertrochanteric fracture of left femur, initial encounter for closed fracture: Principal | ICD-10-CM

## 2018-08-08 DIAGNOSIS — E11311 Type 2 diabetes mellitus with unspecified diabetic retinopathy with macular edema: Secondary | ICD-10-CM

## 2018-08-08 DIAGNOSIS — N183 Chronic kidney disease, stage 3 (moderate): Secondary | ICD-10-CM

## 2018-08-08 DIAGNOSIS — I2510 Atherosclerotic heart disease of native coronary artery without angina pectoris: Secondary | ICD-10-CM

## 2018-08-08 LAB — GLUCOSE, CAPILLARY
Glucose-Capillary: 161 mg/dL — ABNORMAL HIGH (ref 70–99)
Glucose-Capillary: 178 mg/dL — ABNORMAL HIGH (ref 70–99)
Glucose-Capillary: 230 mg/dL — ABNORMAL HIGH (ref 70–99)
Glucose-Capillary: 191 mg/dL — ABNORMAL HIGH (ref 70–99)
Glucose-Capillary: 196 mg/dL — ABNORMAL HIGH (ref 70–99)

## 2018-08-08 LAB — HEMOGLOBIN A1C
HEMOGLOBIN A1C: 7.7 % — AB (ref 4.8–5.6)
Mean Plasma Glucose: 174 mg/dL

## 2018-08-08 LAB — CBC
HCT: 33.2 % — ABNORMAL LOW (ref 36.0–46.0)
Hemoglobin: 9.9 g/dL — ABNORMAL LOW (ref 12.0–15.0)
MCH: 29.4 pg (ref 26.0–34.0)
MCHC: 29.8 g/dL — ABNORMAL LOW (ref 30.0–36.0)
MCV: 98.5 fL (ref 80.0–100.0)
Platelets: 179 10*3/uL (ref 150–400)
RBC: 3.37 MIL/uL — ABNORMAL LOW (ref 3.87–5.11)
RDW: 12.7 % (ref 11.5–15.5)
WBC: 9.2 10*3/uL (ref 4.0–10.5)
nRBC: 0 % (ref 0.0–0.2)

## 2018-08-08 LAB — BASIC METABOLIC PANEL
Anion gap: 8 (ref 5–15)
BUN: 35 mg/dL — ABNORMAL HIGH (ref 8–23)
CHLORIDE: 100 mmol/L (ref 98–111)
CO2: 27 mmol/L (ref 22–32)
Calcium: 8.8 mg/dL — ABNORMAL LOW (ref 8.9–10.3)
Creatinine, Ser: 1.66 mg/dL — ABNORMAL HIGH (ref 0.44–1.00)
GFR calc Af Amer: 32 mL/min — ABNORMAL LOW (ref >60)
GFR calc non Af Amer: 28 mL/min — ABNORMAL LOW (ref >60)
Glucose, Bld: 202 mg/dL — ABNORMAL HIGH (ref 70–99)
Potassium: 4.3 mmol/L (ref 3.5–5.1)
SODIUM: 135 mmol/L (ref 135–145)

## 2018-08-08 MED ORDER — CEFAZOLIN SODIUM-DEXTROSE 2-4 GM/100ML-% IV SOLN
2.0000 g | Freq: Two times a day (BID) | INTRAVENOUS | Status: AC
  Administered 2018-08-08 (×2): 2 g via INTRAVENOUS
  Filled 2018-08-08 (×2): qty 100

## 2018-08-08 MED ORDER — INSULIN ASPART 100 UNIT/ML ~~LOC~~ SOLN
0.0000 [IU] | Freq: Every day | SUBCUTANEOUS | Status: DC
  Administered 2018-08-09: 2 [IU] via SUBCUTANEOUS
  Administered 2018-08-11: 3 [IU] via SUBCUTANEOUS

## 2018-08-08 MED ORDER — INSULIN ASPART 100 UNIT/ML ~~LOC~~ SOLN
0.0000 [IU] | Freq: Three times a day (TID) | SUBCUTANEOUS | Status: DC
  Administered 2018-08-08: 4 [IU] via SUBCUTANEOUS
  Administered 2018-08-08: 7 [IU] via SUBCUTANEOUS
  Administered 2018-08-09 (×2): 4 [IU] via SUBCUTANEOUS
  Administered 2018-08-09 – 2018-08-11 (×2): 7 [IU] via SUBCUTANEOUS
  Administered 2018-08-12: 3 [IU] via SUBCUTANEOUS
  Administered 2018-08-12: 4 [IU] via SUBCUTANEOUS

## 2018-08-08 NOTE — Clinical Social Work Note (Addendum)
Clinical Social Work Assessment  Patient Details  Name: Andrea Wilkinson MRN: [id number] Date of Birth: 07-27-1931  Date of referral:  08/08/18               Reason for consult:  Discharge Planning                Permission sought to share information with:    Permission granted to share information::  Yes, Verbal Permission Granted  Name::       Luiz Blare  Agency::  SNF  Relationship::  Adult Children/Grandchildren   Contact Information:    [phone]  Housing/Transportation Living arrangements for the past 2 months:  [place] of Information:  Patient Patient Interpreter Needed:  None Criminal Activity/Legal Involvement Pertinent to Current Situation/Hospitalization:  No - Comment as needed Significant Relationships:  Adult Children Lives with:  Self Do you feel safe going back to the place where you live?  No Need for family participation in patient care: Yes   Care giving concerns:   Fall, Hip pain.  female with medical history significant of diabetes mellitus on insulin, CAD, CABG in 03/2016, chronic kidney disease stage III, hypertension was in her usual state of health yesterday, in the afternoon got up to go to the bathroom subsequently fell and lost her balance  Facilities manager / plan:  CSW met with the patient, grandson, and her granddaughter in Sports coach at bedside. Patient reports she live alone and is ambulatory with a cane when she walks long distances. Patient is independent with her ADL's. Patient granddaughter administers medications daily.  CSW explain SNF process and will later follow up with bed offers. Patient family prefers a SNF closer to the patient home.   CSW explain the patient will need [employer] Authorization prior to discharging to SNF.   PASRR done.  FL2 complete.  Plan: SNF   Employment status:  Retired Forensic scientist:  Medicare PT Recommendations:  [place] / Referral to community  resources:  [place]  Patient/Family's Response to care:  Agreeable and Responding well to care.   Patient/Family's Understanding of and Emotional Response to Diagnosis, Current Treatment, and Prognosis:  Patient grandchildren are very involved in the patient care and understand her diagnosis and recommended treatment for rehab placement at SNF.   Emotional Assessment Appearance:  Appears stated age Attitude/Demeanor/Rapport:    Affect (typically observed):  Accepting Orientation:  Oriented to Self, Oriented to Place, Oriented to  Time, Oriented to Situation Alcohol / Substance use:  Not Applicable Psych involvement (Current and /or in the community):  No (Comment)  Discharge Needs  Concerns to be addressed:  Discharge Planning Concerns Readmission within the last 30 days:  No Current discharge risk:  Dependent with Mobility Barriers to Discharge:  Ship broker, Continued Medical Work up   [employer], [place] 08/08/2018, 11:30 AM

## 2018-08-08 NOTE — NC FL2 (Signed)
[place] MEDICAID FL2 LEVEL OF CARE SCREENING TOOL     IDENTIFICATION  Patient Name: Andrea Wilkinson Birthdate: 04-Jun-1932 Sex: female Admission Date (Current Location): 08/06/2018  [facility] [place] and [place] Number:  Herbalist and Address:  [facility],  [place] [address], [place]      Provider Number: [id number]  Attending Physician Name and Address:  Mercy Riding, MD  Relative Name and Phone Number:       Current Level of Care: Hospital Recommended Level of Care: [place] Prior Approval Number:    Date Approved/Denied:   PASRR Number:   [id number]  Discharge Plan:SNF    Current Diagnoses: Patient Active Problem List   Diagnosis Date Noted  . Hip fracture due to osteoporosis, initial encounter ([place]) 08/07/2018  . Hip fracture ([place]) 08/07/2018  . Closed comminuted intertrochanteric fracture of left femur ([place]) 08/07/2018  . Hypertriglyceridemia, essential 05/01/2018  . Degeneration of lumbar intervertebral disc 07/21/2017  . Lumbago with sciatica 07/21/2017  . CAD (coronary artery disease) 07/21/2016  . Cardiomyopathy, ischemic 03/23/2016  . CKD (chronic kidney disease) stage 3, GFR 30-59 ml/min (HCC) 03/23/2016  . GERD (gastroesophageal reflux disease) 01/18/2016  . Dietary vitamin B12 deficiency anemia 11/03/2015  . Diabetic retinopathy of left eye associated with type 2 diabetes mellitus ([place]) 08/10/2015  . OAB (overactive bladder) 05/05/2015  . Routine general medical examination at a health care facility 05/05/2015  . IBS (irritable bowel syndrome) 08/17/2014  . Eczema, allergic 04/07/2014  . Hyperlipidemia LDL goal <70 09/24/2013  . ANEMIA, IRON DEFICIENCY 11/05/2009  . Essential hypertension 06/07/2007  . Osteoarthritis 06/07/2007  . Osteoarthritis of spine 06/07/2007    Orientation RESPIRATION BLADDER Height & Weight     Self, Time, Situation, Place  Normal Continent Weight: 160 lb (72.6 kg) Height:  5'  (152.4 cm)  BEHAVIORAL SYMPTOMS/MOOD NEUROLOGICAL BOWEL NUTRITION STATUS      Continent Diet(Carb Modified. )  AMBULATORY STATUS COMMUNICATION OF NEEDS Skin   Extensive Assist Verbally Surgical wounds                       Personal Care Assistance Level of Assistance  Bathing, Feeding, Dressing Bathing Assistance: Limited assistance Feeding assistance: Independent Dressing Assistance: Maximum assistance     Functional Limitations Info  Sight, Hearing, Speech Sight Info: Impaired Hearing Info: Adequate Speech Info: Adequate    SPECIAL CARE FACTORS FREQUENCY  PT (By licensed PT), OT (By licensed OT)     PT Frequency: 5x/week OT Frequency: 5x/week            Contractures Contractures Info: Not present    Additional Factors Info  Code Status, Allergies, Psychotropic, Insulin Sliding Scale Code Status Info: Fullcode  Allergies Info: Allergies: Codeine   Insulin Sliding Scale Info: See medication list        Current Medications (08/08/2018):  This is the current hospital active medication list Current Facility-Administered Medications  Medication Dose Route Frequency Provider Last Rate Last Dose  . acetaminophen (TYLENOL) tablet 325-650 mg  325-650 mg Oral Q6H PRN Swinteck, Aaron Edelman, MD      . amLODipine (NORVASC) tablet 5 mg  5 mg Oral Daily Swinteck, Aaron Edelman, MD   5 mg at 08/08/18 1031  . aspirin EC tablet 81 mg  81 mg Oral Daily Rod Can, MD   81 mg at 08/08/18 1031  . ceFAZolin (ANCEF) IVPB 2g/100 mL premix  2 g Intravenous Q12H Domenic Polite, MD 200 mL/hr at  08/08/18 0533 2 g at 08/08/18 0533  . docusate sodium (COLACE) capsule 100 mg  100 mg Oral BID Rod Can, MD   100 mg at 08/08/18 1031  . enoxaparin (LOVENOX) injection 30 mg  30 mg Subcutaneous QAC breakfast Rod Can, MD   30 mg at 08/08/18 0747  . HYDROmorphone (DILAUDID) injection 0.5-1 mg  0.5-1 mg Intravenous Q4H PRN Swinteck, Aaron Edelman, MD      . insulin aspart (novoLOG) injection 0-15  Units  0-15 Units Subcutaneous TID WC Rod Can, MD   3 Units at 08/08/18 0747  . insulin glargine (LANTUS) injection 10 Units  10 Units Subcutaneous Daily Rod Can, MD   10 Units at 08/08/18 1031  . menthol-cetylpyridinium (CEPACOL) lozenge 3 mg  1 lozenge Oral PRN Swinteck, Aaron Edelman, MD       Or  . phenol (CHLORASEPTIC) mouth spray 1 spray  1 spray Mouth/Throat PRN Swinteck, Aaron Edelman, MD      . methocarbamol (ROBAXIN) tablet 500 mg  500 mg Oral Q6H PRN Swinteck, Aaron Edelman, MD       Or  . methocarbamol (ROBAXIN) 500 mg in dextrose 5 % 50 mL IVPB  500 mg Intravenous Q6H PRN Swinteck, Aaron Edelman, MD      . metoCLOPramide (REGLAN) tablet 5-10 mg  5-10 mg Oral Q8H PRN Swinteck, Aaron Edelman, MD       Or  . metoCLOPramide (REGLAN) injection 5-10 mg  5-10 mg Intravenous Q8H PRN Swinteck, Aaron Edelman, MD      . metoprolol tartrate (LOPRESSOR) tablet 37.5 mg  37.5 mg Oral BID Rod Can, MD   37.5 mg at 08/08/18 1031  . ondansetron (ZOFRAN) tablet 4 mg  4 mg Oral Q6H PRN Swinteck, Aaron Edelman, MD       Or  . ondansetron (ZOFRAN) injection 4 mg  4 mg Intravenous Q6H PRN Swinteck, Aaron Edelman, MD      . oxyCODONE (Oxy IR/ROXICODONE) immediate release tablet 10-15 mg  10-15 mg Oral Q4H PRN Swinteck, Aaron Edelman, MD      . oxyCODONE (Oxy IR/ROXICODONE) immediate release tablet 5-10 mg  5-10 mg Oral Q4H PRN Rod Can, MD   5 mg at 08/08/18 1031  . senna (SENOKOT) tablet 8.6 mg  1 tablet Oral BID Rod Can, MD   8.6 mg at 08/08/18 1031     Discharge Medications: Please see discharge summary for a list of discharge medications.  Relevant Imaging Results:  Relevant Lab Results:   Additional Information [id number]  Lia Hopping, LCSW

## 2018-08-08 NOTE — Progress Notes (Signed)
    Subjective:  Patient reports pain as mild to moderate.  Denies N/V/CP/SOB. No c/o.  Objective:   VITALS:   Vitals:   08/08/18 0007 08/08/18 0126 08/08/18 0537 08/08/18 1006  BP: 124/61 (!) 101/58 117/62 (!) 114/55  Pulse: 72 72 75 81  Resp: 16 16 18 17   Temp: 99.2 F (37.3 C) 98.1 F (36.7 C) 98 F (36.7 C) 98.6 F (37 C)  TempSrc: Oral Oral Oral Oral  SpO2: 100% 99% 99% 93%  Weight:      Height:        NAD ABD soft Sensation intact distally Intact pulses distally Dorsiflexion/Plantar flexion intact Incision: dressing C/D/I Compartment soft   Lab Results  Component Value Date   WBC 9.2 08/08/2018   HGB 9.9 (L) 08/08/2018   HCT 33.2 (L) 08/08/2018   MCV 98.5 08/08/2018   PLT 179 08/08/2018   BMET    Component Value Date/Time   NA 135 08/08/2018 0541   K 4.3 08/08/2018 0541   CL 100 08/08/2018 0541   CO2 27 08/08/2018 0541   GLUCOSE 202 (H) 08/08/2018 0541   BUN 35 (H) 08/08/2018 0541   CREATININE 1.66 (H) 08/08/2018 0541   CALCIUM 8.8 (L) 08/08/2018 0541   GFRNONAA 28 (L) 08/08/2018 0541   GFRAA 32 (L) 08/08/2018 0541     Assessment/Plan: 1 Day Post-Op   Principal Problem:   Hip fracture due to osteoporosis, initial encounter ([place]) Active Problems:   Essential hypertension   Diabetic retinopathy of left eye associated with type 2 diabetes mellitus (HCC)   Cardiomyopathy, ischemic   CKD (chronic kidney disease) stage 3, GFR 30-59 ml/min (HCC)   CAD (coronary artery disease)   Hip fracture (HCC)   Closed comminuted intertrochanteric fracture of left femur ([place])   WBAT with walker DVT ppx: Lovenox for 30 days, SCDs, TEDS PO pain control PT/OT Dispo: D/C planning   Andrea Wilkinson Andrea Wilkinson 08/08/2018, 10:27 AM   Andrea Can, MD Cell: [phone] Andrea Wilkinson is now [facility]  [place] [address], [address], [place], [place] [address] Phone: [phone] [email or web address] Facebook  [employer]

## 2018-08-08 NOTE — Progress Notes (Addendum)
Patient and family agreeable to [place]. SNF started [employer] authorization process.  CSW will inform medical team when Josem Kaufmann is received.   Kathrin Greathouse, Marlinda Mike, MSW Clinical Social Worker  [phone] 08/08/2018  4:42 PM

## 2018-08-08 NOTE — Evaluation (Signed)
Physical Therapy Evaluation Patient Details Name: Andrea Wilkinson MRN: [id number] DOB: 02/19/1932 Today's Date: 08/08/2018   History of Present Illness  85 y.o. female who complains of left hip pain after she fell at home, s/p L  IM nail per Dr. Lyla Glassing on 08/07/18; PMH: CAD, memory loss, HTN, bil TKA, MI  Clinical Impression  Pt admitted with above diagnosis. Pt currently with functional limitations due to the deficits listed below (see PT Problem List). Pt will benefit from SNF level rehab post acute. Will follow in acute setting.  Pt will benefit from skilled PT to increase their independence and safety with mobility to allow discharge to the venue listed below.       Follow Up Recommendations SNF    Equipment Recommendations  None recommended by PT    Recommendations for Other Services       Precautions / Restrictions Precautions Precautions: Fall Restrictions Weight Bearing Restrictions: No Other Position/Activity Restrictions: WBAT      Mobility  Bed Mobility Overal bed mobility: Needs Assistance Bed Mobility: Supine to Sit     Supine to sit: +2 for safety/equipment;+2 for physical assistance;Mod assist     General bed mobility comments: assist with LLE and trunk to upright  Transfers Overall transfer level: Needs assistance Equipment used: Rolling walker (2 wheeled) Transfers: Sit to/from [facility] Sit to Stand: Mod assist;+2 physical assistance;+2 safety/equipment Stand pivot transfers: +2 physical assistance;+2 safety/equipment;Mod assist       General transfer comment: assist to rise and stabilize, cues for hand placement and overall safety; pivot steps to chair  Ambulation/Gait                Stairs            Wheelchair Mobility    Modified Rankin (Stroke Patients Only)       Balance                                             Pertinent Vitals/Pain Pain Assessment: Faces Faces Pain Scale:  Hurts little more Pain Location: L LE/hip Pain Descriptors / Indicators: Guarding;Grimacing;Sore Pain Intervention(s): Limited activity within patient's tolerance;Monitored during session;Premedicated before session;Repositioned;Ice applied    Home Living Family/patient expects to be discharged to:: Skilled nursing facility Living Arrangements: Alone               Additional Comments: family checks in on her 2x/day    Prior Function Level of Independence: Independent;Needs assistance   Gait / Transfers Assistance Needed: independent ambulator  ADL's / Homemaking Assistance Needed: intermittent assist from family         Hand Dominance        Extremity/Trunk Assessment   Upper Extremity Assessment Upper Extremity Assessment: Defer to OT evaluation    Lower Extremity Assessment Lower Extremity Assessment: LLE deficits/detail LLE Deficits / Details: AAROM limited by pain. strength grossly 2+/5       Communication   Communication: No difficulties  Cognition Arousal/Alertness: Awake/alert Behavior During Therapy: WFL for tasks assessed/performed Overall Cognitive Status: History of cognitive impairments - at baseline                                 General Comments: hx of memory loss,  early dementia per pt family      General Comments  Exercises     Assessment/Plan    PT Assessment Patient needs continued PT services  PT Problem List Decreased strength;Decreased activity tolerance;Decreased balance;Decreased mobility;Decreased knowledge of use of DME;Decreased safety awareness;Pain;Decreased range of motion       PT Treatment Interventions DME instruction;Gait training;Functional mobility training;Therapeutic activities;Therapeutic exercise;Patient/family education    PT Goals (Current goals can be found in the Care Plan section)  Acute Rehab PT Goals Patient Stated Goal: to get better PT Goal Formulation: With patient/family Time  For Goal Achievement: 08/22/18 Potential to Achieve Goals: Good    Frequency Min 3X/week   Barriers to discharge        Co-evaluation               AM-PAC PT "6 Clicks" Mobility  Outcome Measure Help needed turning from your back to your side while in a flat bed without using bedrails?: A Lot Help needed moving from lying on your back to sitting on the side of a flat bed without using bedrails?: A Lot Help needed moving to and from a bed to a chair (including a wheelchair)?: A Lot Help needed standing up from a chair using your arms (e.g., wheelchair or bedside chair)?: A Lot Help needed to walk in hospital room?: Total Help needed climbing 3-5 steps with a railing? : Total 6 Click Score: 10    End of Session Equipment Utilized During Treatment: Gait belt Activity Tolerance: Patient tolerated treatment well Patient left: in chair;with call bell/phone within reach;with chair alarm set   PT Visit Diagnosis: Difficulty in walking, not elsewhere classified (R26.2)    Time: 5701-7793 PT Time Calculation (min) (ACUTE ONLY): 18 min   Charges:   PT Evaluation $PT Eval Low Complexity: 1 Low          Kenyon Ana, PT  Pager: [phone] Acute Rehab Dept [facility]): [phone]   08/08/2018   [facility] 08/08/2018, 10:43 AM

## 2018-08-08 NOTE — Progress Notes (Signed)
PROGRESS NOTE  Andrea Wilkinson XBJ:[id number] DOB: [birth year] DOA: 08/06/2018 PCP: Janith Lima, MD   LOS: 1 day   Brief Narrative / Interim history: 85 year old female with history of IDDM 2, CAD status post CABG in 03/2016, CKD 3 and hypertension who presented to ED after fall and subsequent left hip fracture.  Foley started to be mechanical.  Has prior history of falls.  Supposedly using cane but not consistent.  She underwent intramedullary fixation of left femur on 2/19.   Subjective: No major events overnight of this morning.  No complaints.  Pain fairly controlled.  Denies chest pain, dyspnea, nausea, vomiting or abdominal pain.  Assessment & Plan: Principal Problem:   Hip fracture due to osteoporosis, initial encounter [facility]) Active Problems:   Essential hypertension   Diabetic retinopathy of left eye associated with type 2 diabetes mellitus ([place])   Cardiomyopathy, ischemic   CKD (chronic kidney disease) stage 3, GFR 30-59 ml/min (HCC)   CAD (coronary artery disease)   Hip fracture (HCC)   Closed comminuted intertrochanteric fracture of left femur (HCC)  Mechanical fall Left femoral fracture Osteoporosis: Status post intramedullary fixation of left femur on 2/19  -Appreciate orthopedic surgery's care. -Pain fairly controlled. -DVT prophylaxis and bowel regimen. -Need bisphosphonate, vitamin D and calcium supplementation for osteoporosis down the road.  -PT/OT eval  IDDM 2: Reportedly on Tresiba 20 units in the morning at home.  A1c 7.7.  CBG mildly elevated. -Change SSI to high -Adjust basal as appropriate in the morning -CBG monitoring  History of CAD status post CABG in 2017: No cardiopulmonary symptoms. -Continue metoprolol, statin and aspirin.  Hypertension: Normotensive. -Continue metoprolol and amlodipine as above  CKD 3: Stable. -Continue monitoring  Scheduled Meds: . amLODipine  5 mg Oral Daily  . aspirin EC  81 mg Oral Daily  . docusate sodium  100  mg Oral BID  . enoxaparin (LOVENOX) injection  30 mg Subcutaneous QAC breakfast  . insulin aspart  0-15 Units Subcutaneous TID WC  . insulin glargine  10 Units Subcutaneous Daily  . metoprolol tartrate  37.5 mg Oral BID  . senna  1 tablet Oral BID   Continuous Infusions: .  ceFAZolin (ANCEF) IV 2 g (08/08/18 0533)  . methocarbamol (ROBAXIN) IV     PRN Meds:.acetaminophen, HYDROmorphone (DILAUDID) injection, menthol-cetylpyridinium **OR** phenol, methocarbamol **OR** methocarbamol (ROBAXIN) IV, metoCLOPramide **OR** metoCLOPramide (REGLAN) injection, ondansetron **OR** ondansetron (ZOFRAN) IV, oxyCODONE, oxyCODONE  DVT prophylaxis: Subcu Lovenox Code Status: Full code Family Communication: None at bedside Disposition Plan: Remains inpatient.  Anticipate discharge to SNF in the next 24 to 48 hours.  Consultants:   Orthopedic surgery  Procedures:   Intramedullary fixation of left femur on 2/19  Antimicrobials:  Cefazolin on 2/19 -2/20  Objective: Vitals:   08/08/18 0007 08/08/18 0126 08/08/18 0537 08/08/18 1006  BP: 124/61 (!) 101/58 117/62 (!) 114/55  Pulse: 72 72 75 81  Resp: 16 16 18 17   Temp: 99.2 F (37.3 C) 98.1 F (36.7 C) 98 F (36.7 C) 98.6 F (37 C)  TempSrc: Oral Oral Oral Oral  SpO2: 100% 99% 99% 93%  Weight:      Height:        Intake/Output Summary (Last 24 hours) at 08/08/2018 1133 Last data filed at 08/08/2018 0906 Gross per 24 hour  Intake 1210 ml  Output 1330 ml  Net -120 ml   Filed Weights   08/07/18 0047 08/07/18 1706  Weight: 72.6 kg 72.6 kg    Examination:  GENERAL: Appears well. No acute distress.  EYES - vision grossly intact. Sclera anicteric.  NOSE- no gross deformity or drainage MOUTH - no oral lesions noted THROAT- no swelling or erythema LUNGS:  No IWOB. Good air movement. CTAB.  HEART: RRR. Heart sounds normal. ABD: Bowel sounds present. Soft. Non tender.  MSK/EXT: Moves extremities. No obvious deformity. SKIN: no apparent  skin lesion.  NEURO: Awake, alert and oriented appropriately.  No gross deficit.  PSYCH: Calm. Normal affect.   Foley: 2/19-->   Data Reviewed: I have independently reviewed following labs and imaging studies  CBC: Recent Labs  Lab 08/07/18 0109 08/08/18 0541  WBC 11.0* 9.2  NEUTROABS 9.7*  --   HGB 11.5* 9.9*  HCT 36.8 33.2*  MCV 94.4 98.5  PLT 199 128   Basic Metabolic Panel: Recent Labs  Lab 08/07/18 0109 08/08/18 0541  NA 135 135  K 4.1 4.3  CL 100 100  CO2 25 27  GLUCOSE 304* 202*  BUN 38* 35*  CREATININE 1.63* 1.66*  CALCIUM 9.0 8.8*   GFR: Estimated Creatinine Clearance: 22.4 mL/min (A) (by C-G formula based on SCr of 1.66 mg/dL (H)). Liver Function Tests: No results for input(s): AST, ALT, ALKPHOS, BILITOT, PROT, ALBUMIN in the last 168 hours. No results for input(s): LIPASE, AMYLASE in the last 168 hours. No results for input(s): AMMONIA in the last 168 hours. Coagulation Profile: Recent Labs  Lab 08/07/18 0109  INR 1.08   Cardiac Enzymes: No results for input(s): CKTOTAL, CKMB, CKMBINDEX, TROPONINI in the last 168 hours. BNP (last 3 results) No results for input(s): PROBNP in the last 8760 hours. HbA1C: Recent Labs    08/07/18 0109  HGBA1C 7.7*   CBG: Recent Labs  Lab 08/07/18 1632 08/07/18 1657 08/07/18 2000 08/07/18 2301 08/08/18 0720  GLUCAP 140* 161* 157* 151* 178*   Lipid Profile: No results for input(s): CHOL, HDL, LDLCALC, TRIG, CHOLHDL, LDLDIRECT in the last 72 hours. Thyroid Function Tests: No results for input(s): TSH, T4TOTAL, FREET4, T3FREE, THYROIDAB in the last 72 hours. Anemia Panel: No results for input(s): VITAMINB12, FOLATE, FERRITIN, TIBC, IRON, RETICCTPCT in the last 72 hours. Urine analysis:    Component Value Date/Time   COLORURINE STRAW (A) 08/07/2018 0007   APPEARANCEUR CLEAR 08/07/2018 0007   LABSPEC 1.011 08/07/2018 0007   PHURINE 7.0 08/07/2018 0007   GLUCOSEU 150 (A) 08/07/2018 0007   GLUCOSEU  NEGATIVE 05/01/2018 1226   HGBUR NEGATIVE 08/07/2018 0007   HGBUR negative 11/05/2009 0839   BILIRUBINUR NEGATIVE 08/07/2018 0007   BILIRUBINUR negative 05/01/2013 1044   KETONESUR NEGATIVE 08/07/2018 0007   PROTEINUR NEGATIVE 08/07/2018 0007   UROBILINOGEN 0.2 05/01/2018 1226   NITRITE NEGATIVE 08/07/2018 0007   LEUKOCYTESUR NEGATIVE 08/07/2018 0007   Sepsis Labs: Invalid input(s): PROCALCITONIN, LACTICIDVEN  Recent Results (from the past 240 hour(s))  Surgical pcr screen     Status: None   Collection Time: 08/07/18  2:19 PM  Result Value Ref Range Status   MRSA, PCR NEGATIVE NEGATIVE Final   Staphylococcus aureus NEGATIVE NEGATIVE Final    Comment: (NOTE) The Xpert SA Assay (FDA approved for NASAL specimens in patients 54 years of age and older), is one component of a comprehensive surveillance program. It is not intended to diagnose infection nor to guide or monitor treatment. Performed at [facility], [place] [address] [place], [place] [address]       Radiology Studies: Pelvis Portable  Result Date: 08/07/2018 CLINICAL DATA:  Postop EXAM: PORTABLE PELVIS  1-2 VIEWS COMPARISON:  CT 08/07/2018, radiograph 08/07/2018 FINDINGS: Interval intramedullary rod and distal screw fixation of the left femur for intertrochanteric fracture. Displaced lesser trochanteric fracture fragment. Gas in the soft tissues consistent with recent surgery. Vascular calcification. IMPRESSION: Interval surgical fixation of intertrochanteric left femur fracture. Electronically Signed   By: Donavan Foil M.D.   On: 08/07/2018 20:19   Dg C-arm 1-60 Min-no Report  Result Date: 08/07/2018 Fluoroscopy was utilized by the requesting physician.  No radiographic interpretation.   Dg Hip Operative Unilat W Or W/o Pelvis Left  Result Date: 08/07/2018 CLINICAL DATA:  85 year old female undergoing proximal left femur ORIF. EXAM: OPERATIVE LEFT HIP (WITH PELVIS IF PERFORMED) 7 VIEWS TECHNIQUE:  Fluoroscopic spot image(s) were submitted for interpretation post-operatively. COMPARISON:  Left hip series earlier today. FLUOROSCOPY TIME:  0 minutes 46 seconds FINDINGS: Seven intraoperative fluoroscopic spot views of the left hip demonstrate placement of a left femur intramedullary rod with proximal interlocking dynamic hip screw and distal interlocking cortical screw. Hardware appears intact. IMPRESSION: Proximal left femur ORIF with no adverse features identified. Electronically Signed   By: Genevie Ann M.D.   On: 08/07/2018 19:47    [facility]avance Beazer [facility] Pager [phone]  If 7PM-7AM, please contact night-coverage [email or web address] Password [facility] 08/08/2018, 11:33 AM

## 2018-08-09 LAB — BASIC METABOLIC PANEL
Anion gap: 8 (ref 5–15)
BUN: 40 mg/dL — AB (ref 8–23)
CO2: 26 mmol/L (ref 22–32)
CREATININE: 1.95 mg/dL — AB (ref 0.44–1.00)
Calcium: 8.7 mg/dL — ABNORMAL LOW (ref 8.9–10.3)
Chloride: 99 mmol/L (ref 98–111)
GFR calc Af Amer: 27 mL/min — ABNORMAL LOW (ref >60)
GFR calc non Af Amer: 23 mL/min — ABNORMAL LOW (ref >60)
Glucose, Bld: 209 mg/dL — ABNORMAL HIGH (ref 70–99)
Potassium: 4.5 mmol/L (ref 3.5–5.1)
Sodium: 133 mmol/L — ABNORMAL LOW (ref 135–145)

## 2018-08-09 LAB — GLUCOSE, CAPILLARY
Glucose-Capillary: 184 mg/dL — ABNORMAL HIGH (ref 70–99)
Glucose-Capillary: 215 mg/dL — ABNORMAL HIGH (ref 70–99)
Glucose-Capillary: 168 mg/dL — ABNORMAL HIGH (ref 70–99)
Glucose-Capillary: 202 mg/dL — ABNORMAL HIGH (ref 70–99)

## 2018-08-09 LAB — CBC
HCT: 29.8 % — ABNORMAL LOW (ref 36.0–46.0)
Hemoglobin: 9.1 g/dL — ABNORMAL LOW (ref 12.0–15.0)
MCH: 29.8 pg (ref 26.0–34.0)
MCHC: 30.5 g/dL (ref 30.0–36.0)
MCV: 97.7 fL (ref 80.0–100.0)
Platelets: 160 10*3/uL (ref 150–400)
RBC: 3.05 MIL/uL — ABNORMAL LOW (ref 3.87–5.11)
RDW: 12.7 % (ref 11.5–15.5)
WBC: 9 10*3/uL (ref 4.0–10.5)
nRBC: 0 % (ref 0.0–0.2)

## 2018-08-09 MED ORDER — SODIUM CHLORIDE 0.9 % IV SOLN
INTRAVENOUS | Status: AC
  Administered 2018-08-09 (×2): via INTRAVENOUS

## 2018-08-09 NOTE — Progress Notes (Signed)
    Subjective:  Patient reports pain as mild to moderate.  Denies N/V/CP/SOB. No c/o.  Objective:   VITALS:   Vitals:   08/08/18 1744 08/08/18 2111 08/08/18 2117 08/09/18 0522  BP: (!) 105/58 116/60  (!) 115/51  Pulse: 75 85 81 75  Resp: 17 16  18   Temp: 98.4 F (36.9 C) 98.4 F (36.9 C) 98.3 F (36.8 C) 98.7 F (37.1 C)  TempSrc: Oral Oral Oral Oral  SpO2: 94% 92% 94% 97%  Weight:      Height:        NAD ABD soft Sensation intact distally Intact pulses distally Dorsiflexion/Plantar flexion intact Incision: dressing C/D/I Compartment soft   Lab Results  Component Value Date   WBC 9.0 08/09/2018   HGB 9.1 (L) 08/09/2018   HCT 29.8 (L) 08/09/2018   MCV 97.7 08/09/2018   PLT 160 08/09/2018   BMET    Component Value Date/Time   NA 133 (L) 08/09/2018 0538   K 4.5 08/09/2018 0538   CL 99 08/09/2018 0538   CO2 26 08/09/2018 0538   GLUCOSE 209 (H) 08/09/2018 0538   BUN 40 (H) 08/09/2018 0538   CREATININE 1.95 (H) 08/09/2018 0538   CALCIUM 8.7 (L) 08/09/2018 0538   GFRNONAA 23 (L) 08/09/2018 0538   GFRAA 27 (L) 08/09/2018 0538     Assessment/Plan: 2 Days Post-Op   Principal Problem:   Hip fracture due to osteoporosis, initial encounter ([place]) Active Problems:   Essential hypertension   Diabetic retinopathy of left eye associated with type 2 diabetes mellitus (HCC)   Cardiomyopathy, ischemic   CKD (chronic kidney disease) stage 3, GFR 30-59 ml/min (HCC)   CAD (coronary artery disease)   Hip fracture (HCC)   Closed comminuted intertrochanteric fracture of left femur ([place])   WBAT with walker DVT ppx: Lovenox for 30 days, SCDs, TEDS PO pain control PT/OT Dispo: D/C planning   Hilton Cork Latonia Conrow 08/09/2018, 10:50 AM   Rod Can, MD Cell: [phone] [place] is now [facility]  [place] [address], [place], [place] [address] Phone: [phone] [email or web address] Facebook  [employer]

## 2018-08-09 NOTE — Progress Notes (Signed)
PROGRESS NOTE  Andrea Wilkinson HLK:[id number] DOB: 1932/05/20 DOA: 08/06/2018 PCP: Janith Lima, MD   LOS: 2 days   Brief Narrative / Interim history: 85 year old female with history of IDDM 2, CAD status post CABG in 03/2016, CKD 3 and hypertension who presented to ED after fall and subsequent left hip fracture.  Foley started to be mechanical.  Has prior history of falls.  Supposedly using cane but not consistent.  She underwent intramedullary fixation of left femur on 2/19.   Subjective: No major events overnight of this morning.  No complaints.  Pain fairly controlled.  Denies chest pain, dyspnea, nausea, vomiting or abdominal pain.  Has not had bowel movement yet but passing gas.  Assessment & Plan: Principal Problem:   Hip fracture due to osteoporosis, initial encounter [facility]) Active Problems:   Essential hypertension   Diabetic retinopathy of left eye associated with type 2 diabetes mellitus ([place])   Cardiomyopathy, ischemic   CKD (chronic kidney disease) stage 3, GFR 30-59 ml/min (HCC)   CAD (coronary artery disease)   Hip fracture (HCC)   Closed comminuted intertrochanteric fracture of left femur (HCC)  Mechanical fall Left femoral fracture Osteoporosis: Status post intramedullary fixation of left femur on 2/19  -Appreciate orthopedic surgery's care. -Pain fairly controlled. -DVT prophylaxis and bowel regimen. -Need bisphosphonate, vitamin D and calcium supplementation for osteoporosis down the road.  -PT/OT eval  IDDM 2: Reportedly on Tresiba 20 units in the morning at home.  A1c 7.7.  CBG mildly elevated. -Change SSI to high -Adjust basal as appropriate in the morning -CBG monitoring  History of CAD status post CABG in 2017: No cardiopulmonary symptoms. -Continue metoprolol, statin and aspirin.  Hypertension: Normotensive. -Continue metoprolol and amlodipine as above  CKD 3: Mild creatinine elevation likely prerenal.  She is not on nephrotoxic meds. -IV normal  saline at 125 cc for 12 hours. -Recheck renal function in the morning  Scheduled Meds: . amLODipine  5 mg Oral Daily  . aspirin EC  81 mg Oral Daily  . docusate sodium  100 mg Oral BID  . enoxaparin (LOVENOX) injection  30 mg Subcutaneous QAC breakfast  . insulin aspart  0-20 Units Subcutaneous TID WC  . insulin aspart  0-5 Units Subcutaneous QHS  . insulin glargine  10 Units Subcutaneous Daily  . metoprolol tartrate  37.5 mg Oral BID  . senna  1 tablet Oral BID   Continuous Infusions: . sodium chloride 100 mL/hr at 08/09/18 1011  . methocarbamol (ROBAXIN) IV     PRN Meds:.acetaminophen, HYDROmorphone (DILAUDID) injection, menthol-cetylpyridinium **OR** phenol, methocarbamol **OR** methocarbamol (ROBAXIN) IV, metoCLOPramide **OR** metoCLOPramide (REGLAN) injection, ondansetron **OR** ondansetron (ZOFRAN) IV, oxyCODONE, oxyCODONE  DVT prophylaxis: Subcu Lovenox Code Status: Full code Family Communication: None at bedside Disposition Plan: Remains inpatient pending insurance authorization for SNF placement.  Consultants:   Orthopedic surgery  Procedures:   Intramedullary fixation of left femur on 2/19  Antimicrobials:  Cefazolin on 2/19 -2/20  Objective: Vitals:   08/08/18 1744 08/08/18 2111 08/08/18 2117 08/09/18 0522  BP: (!) 105/58 116/60  (!) 115/51  Pulse: 75 85 81 75  Resp: 17 16  18   Temp: 98.4 F (36.9 C) 98.4 F (36.9 C) 98.3 F (36.8 C) 98.7 F (37.1 C)  TempSrc: Oral Oral Oral Oral  SpO2: 94% 92% 94% 97%  Weight:      Height:        Intake/Output Summary (Last 24 hours) at 08/09/2018 1238 Last data filed at 08/09/2018 1011 Gross  per 24 hour  Intake 880 ml  Output 1700 ml  Net -820 ml   Filed Weights   08/07/18 0047 08/07/18 1706  Weight: 72.6 kg 72.6 kg    Examination:  GENERAL: Appears well. No acute distress.  HEENT: MMM.  Vision and Hearing grossly intact.  NECK: Supple.  No JVD.  LUNGS:  No IWOB. Good air movement. CTAB.  HEART:  RRR.  Heart sounds normal.  ABD: Bowel sounds present. Soft. Non tender.  EXT:   no edema bilaterally.  SKIN: no apparent skin lesion.  NEURO: Awake, alert and oriented appropriately.  No gross deficit.  PSYCH: Calm. Normal affect.   Data Reviewed: I have independently reviewed following labs and imaging studies  CBC: Recent Labs  Lab 08/07/18 0109 08/08/18 0541 08/09/18 0538  WBC 11.0* 9.2 9.0  NEUTROABS 9.7*  --   --   HGB 11.5* 9.9* 9.1*  HCT 36.8 33.2* 29.8*  MCV 94.4 98.5 97.7  PLT 199 179 242   Basic Metabolic Panel: Recent Labs  Lab 08/07/18 0109 08/08/18 0541 08/09/18 0538  NA 135 135 133*  K 4.1 4.3 4.5  CL 100 100 99  CO2 25 27 26   GLUCOSE 304* 202* 209*  BUN 38* 35* 40*  CREATININE 1.63* 1.66* 1.95*  CALCIUM 9.0 8.8* 8.7*   GFR: Estimated Creatinine Clearance: 19.1 mL/min (A) (by C-G formula based on SCr of 1.95 mg/dL (H)). Liver Function Tests: No results for input(s): AST, ALT, ALKPHOS, BILITOT, PROT, ALBUMIN in the last 168 hours. No results for input(s): LIPASE, AMYLASE in the last 168 hours. No results for input(s): AMMONIA in the last 168 hours. Coagulation Profile: Recent Labs  Lab 08/07/18 0109  INR 1.08   Cardiac Enzymes: No results for input(s): CKTOTAL, CKMB, CKMBINDEX, TROPONINI in the last 168 hours. BNP (last 3 results) No results for input(s): PROBNP in the last 8760 hours. HbA1C: Recent Labs    08/07/18 0109  HGBA1C 7.7*   CBG: Recent Labs  Lab 08/08/18 1135 08/08/18 1743 08/08/18 2118 08/09/18 0745 08/09/18 1143  GLUCAP 230* 191* 196* 184* 215*   Lipid Profile: No results for input(s): CHOL, HDL, LDLCALC, TRIG, CHOLHDL, LDLDIRECT in the last 72 hours. Thyroid Function Tests: No results for input(s): TSH, T4TOTAL, FREET4, T3FREE, THYROIDAB in the last 72 hours. Anemia Panel: No results for input(s): VITAMINB12, FOLATE, FERRITIN, TIBC, IRON, RETICCTPCT in the last 72 hours. Urine analysis:    Component Value Date/Time     COLORURINE STRAW (A) 08/07/2018 0007   APPEARANCEUR CLEAR 08/07/2018 0007   LABSPEC 1.011 08/07/2018 0007   PHURINE 7.0 08/07/2018 0007   GLUCOSEU 150 (A) 08/07/2018 0007   GLUCOSEU NEGATIVE 05/01/2018 1226   HGBUR NEGATIVE 08/07/2018 0007   HGBUR negative 11/05/2009 0839   BILIRUBINUR NEGATIVE 08/07/2018 0007   BILIRUBINUR negative 05/01/2013 1044   KETONESUR NEGATIVE 08/07/2018 0007   PROTEINUR NEGATIVE 08/07/2018 0007   UROBILINOGEN 0.2 05/01/2018 1226   NITRITE NEGATIVE 08/07/2018 0007   LEUKOCYTESUR NEGATIVE 08/07/2018 0007   Sepsis Labs: Invalid input(s): PROCALCITONIN, LACTICIDVEN  Recent Results (from the past 240 hour(s))  Surgical pcr screen     Status: None   Collection Time: 08/07/18  2:19 PM  Result Value Ref Range Status   MRSA, PCR NEGATIVE NEGATIVE Final   Staphylococcus aureus NEGATIVE NEGATIVE Final    Comment: (NOTE) The Xpert SA Assay (FDA approved for NASAL specimens in patients 14 years of age and older), is one component of a comprehensive surveillance program. It  is not intended to diagnose infection nor to guide or monitor treatment. Performed at [facility], [place] [address], [place], [place] [address]       Radiology Studies: No results found.  Taye T. [facility] Pager [phone]  If 7PM-7AM, please contact night-coverage [email or web address] Password [facility] 08/09/2018, 12:38 PM

## 2018-08-09 NOTE — Care Management Important Message (Signed)
Important Message  Patient Details  Name: Andrea Wilkinson MRN: [id number] Date of Birth: 1932-05-05   Medicare Important Message Given:  Yes    Kerin Salen 08/09/2018, 3:48 [employer] Message  Patient Details  Name: Andrea Wilkinson MRN: [id number] Date of Birth: August 16, 1931   Medicare Important Message Given:  Yes    Kerin Salen 08/09/2018, 3:48 PM

## 2018-08-09 NOTE — Progress Notes (Signed)
Physical Therapy Treatment Patient Details Name: Andrea Wilkinson MRN: [id number] DOB: 11-02-1931 Today's Date: 08/09/2018    History of Present Illness 85 y.o. female who complains of left hip pain after she fell at home, s/p L  IM nail per Dr. Lyla Glassing on 08/07/18; PMH: CAD, memory loss, HTN, bil TKA, MI    PT Comments    Pt is progressing toward PT goals, incr gait distance today; pt is pleasant and cooperative with PT; continue to recommend SNF post acute.  Continue  PT POC  Follow Up Recommendations  SNF     Equipment Recommendations  None recommended by PT    Recommendations for Other Services       Precautions / Restrictions Precautions Precautions: Fall Restrictions Weight Bearing Restrictions: No Other Position/Activity Restrictions: WBAT    Mobility  Bed Mobility Overal bed mobility: Needs Assistance Bed Mobility: Supine to Sit     Supine to sit: Mod assist;Max assist;HOB elevated     General bed mobility comments: assist with LLE and trunk to upright, incr time, heavy use of rail  Transfers Overall transfer level: Needs assistance Equipment used: Rolling walker (2 wheeled) Transfers: Sit to/from Stand Sit to Stand: Mod assist;Min assist         General transfer comment: assist to rise and stabilize, cues for hand placement and overall safety  Ambulation/Gait Ambulation/Gait assistance: Min assist;Mod assist Gait Distance (Feet): 7 Feet Assistive device: Rolling walker (2 wheeled) Gait Pattern/deviations: Step-to pattern;Antalgic     General Gait Details: cues for RW safety, sequence, trunk extension    Stairs             Wheelchair Mobility    Modified Rankin (Stroke Patients Only)       Balance                                            Cognition Arousal/Alertness: Awake/alert Behavior During Therapy: WFL for tasks assessed/performed Overall Cognitive Status: History of cognitive impairments - at baseline                                  General Comments: hx of memory loss,  early dementia per pt family; follows commands consistently      Exercises General Exercises - Lower Extremity Ankle Circles/Pumps: AROM;Both;10 reps Quad Sets: AROM;10 reps;Both Heel Slides: AAROM;Left;10 reps    General Comments        Pertinent Vitals/Pain Pain Assessment: Faces Faces Pain Scale: Hurts a little bit Pain Location: L LE/hip Pain Descriptors / Indicators: Guarding;Grimacing;Sore Pain Intervention(s): Limited activity within patient's tolerance;Monitored during session;Repositioned    Home Living                      Prior Function            PT Goals (current goals can now be found in the care plan section) Acute Rehab PT Goals Patient Stated Goal: to get better PT Goal Formulation: With patient/family Time For Goal Achievement: 08/22/18 Potential to Achieve Goals: Good Progress towards PT goals: Progressing toward goals    Frequency    Min 3X/week      PT Plan Current plan remains appropriate    Co-evaluation              AM-PAC PT "6  Clicks" Mobility   Outcome Measure  Help needed turning from your back to your side while in a flat bed without using bedrails?: A Lot Help needed moving from lying on your back to sitting on the side of a flat bed without using bedrails?: A Lot Help needed moving to and from a bed to a chair (including a wheelchair)?: A Lot Help needed standing up from a chair using your arms (e.g., wheelchair or bedside chair)?: A Lot Help needed to walk in hospital room?: A Lot Help needed climbing 3-5 steps with a railing? : Total 6 Click Score: 11    End of Session Equipment Utilized During Treatment: Gait belt Activity Tolerance: Patient tolerated treatment well Patient left: in chair;with call bell/phone within reach;with chair alarm set   PT Visit Diagnosis: Difficulty in walking, not elsewhere classified (R26.2)      Time: 1140-1157 PT Time Calculation (min) (ACUTE ONLY): 17 min  Charges:  $Gait Training: 8-22 mins                     Kenyon Ana, PT  Pager: [phone] Acute Rehab Dept [facility]): [phone]   08/09/2018    [facility] 08/09/2018, 12:02 PM

## 2018-08-10 LAB — CBC
HCT: 26.6 % — ABNORMAL LOW (ref 36.0–46.0)
Hemoglobin: 8.1 g/dL — ABNORMAL LOW (ref 12.0–15.0)
MCH: 30.1 pg (ref 26.0–34.0)
MCHC: 30.5 g/dL (ref 30.0–36.0)
MCV: 98.9 fL (ref 80.0–100.0)
Platelets: 141 10*3/uL — ABNORMAL LOW (ref 150–400)
RBC: 2.69 MIL/uL — ABNORMAL LOW (ref 3.87–5.11)
RDW: 12.8 % (ref 11.5–15.5)
WBC: 7.8 10*3/uL (ref 4.0–10.5)
nRBC: 0 % (ref 0.0–0.2)

## 2018-08-10 LAB — URINE CULTURE
Culture: NO GROWTH
Special Requests: NORMAL

## 2018-08-10 LAB — GLUCOSE, CAPILLARY
GLUCOSE-CAPILLARY: 206 mg/dL — AB (ref 70–99)
Glucose-Capillary: 154 mg/dL — ABNORMAL HIGH (ref 70–99)
Glucose-Capillary: 162 mg/dL — ABNORMAL HIGH (ref 70–99)
Glucose-Capillary: 192 mg/dL — ABNORMAL HIGH (ref 70–99)

## 2018-08-10 LAB — BASIC METABOLIC PANEL
Anion gap: 8 (ref 5–15)
BUN: 43 mg/dL — ABNORMAL HIGH (ref 8–23)
CO2: 22 mmol/L (ref 22–32)
Calcium: 8.3 mg/dL — ABNORMAL LOW (ref 8.9–10.3)
Chloride: 108 mmol/L (ref 98–111)
Creatinine, Ser: 1.83 mg/dL — ABNORMAL HIGH (ref 0.44–1.00)
GFR calc Af Amer: 29 mL/min — ABNORMAL LOW (ref >60)
GFR, EST NON AFRICAN AMERICAN: 25 mL/min — AB (ref >60)
Glucose, Bld: 155 mg/dL — ABNORMAL HIGH (ref 70–99)
Potassium: 4.1 mmol/L (ref 3.5–5.1)
Sodium: 138 mmol/L (ref 135–145)

## 2018-08-10 LAB — HEMOGLOBIN AND HEMATOCRIT, BLOOD
HCT: 27.9 % — ABNORMAL LOW (ref 36.0–46.0)
HEMOGLOBIN: 8.5 g/dL — AB (ref 12.0–15.0)

## 2018-08-10 MED ORDER — ENOXAPARIN SODIUM 30 MG/0.3ML ~~LOC~~ SOLN: 30.0000 mg | Freq: Every day | SUBCUTANEOUS | 0 refills | Status: AC

## 2018-08-10 MED ORDER — OXYCODONE HCL 5 MG PO TABS: 5.0000 mg | ORAL_TABLET | ORAL | 0 refills | Status: AC | PRN

## 2018-08-10 NOTE — Plan of Care (Signed)
Pt continues to be weak overall. No needs at this time. Pt continues to have episodes of lethargy, though arousable. She is awake and alert this am. No s/s of distress. No s/s of pain. Will continue to monitor.

## 2018-08-10 NOTE — Progress Notes (Signed)
    Subjective:  Patient reports pain as mild to moderate.  Denies N/V/CP/SOB. No c/o.  Objective:   VITALS:   Vitals:   08/09/18 0522 08/09/18 1309 08/09/18 2153 08/10/18 0520  BP: (!) 115/51 (!) 110/54 (!) 124/52 (!) 128/56  Pulse: 75 67 73 71  Resp: 18 16 16    Temp: 98.7 F (37.1 C) 97.9 F (36.6 C) 98.4 F (36.9 C) 99.4 F (37.4 C)  TempSrc: Oral Oral Oral Oral  SpO2: 97% 91% 97% 96%  Weight:      Height:        NAD ABD soft Sensation intact distally Intact pulses distally Dorsiflexion/Plantar flexion intact Incision: dressing C/D/I Compartment soft   Lab Results  Component Value Date   WBC 7.8 08/10/2018   HGB 8.1 (L) 08/10/2018   HCT 26.6 (L) 08/10/2018   MCV 98.9 08/10/2018   PLT 141 (L) 08/10/2018   BMET    Component Value Date/Time   NA 138 08/10/2018 0358   K 4.1 08/10/2018 0358   CL 108 08/10/2018 0358   CO2 22 08/10/2018 0358   GLUCOSE 155 (H) 08/10/2018 0358   BUN 43 (H) 08/10/2018 0358   CREATININE 1.83 (H) 08/10/2018 0358   CALCIUM 8.3 (L) 08/10/2018 0358   GFRNONAA 25 (L) 08/10/2018 0358   GFRAA 29 (L) 08/10/2018 0358     Assessment/Plan: 3 Days Post-Op   Principal Problem:   Hip fracture due to osteoporosis, initial encounter ([place]) Active Problems:   Essential hypertension   Diabetic retinopathy of left eye associated with type 2 diabetes mellitus (HCC)   Cardiomyopathy, ischemic   CKD (chronic kidney disease) stage 3, GFR 30-59 ml/min (HCC)   CAD (coronary artery disease)   Hip fracture (HCC)   Closed comminuted intertrochanteric fracture of left femur (HCC)   WBAT with walker DVT ppx: Lovenox for 30 days, SCDs, TEDS PO pain control PT/OT Dispo: D/C planning   Hilton Cork Jonisha Kindig 08/10/2018, 9:58 AM   Rod Can, MD Cell: [phone] [place] [facility] now [facility]  [place] [address], [place], [place] [address] Phone: [phone] [email or web address] Facebook   [employer]

## 2018-08-10 NOTE — Progress Notes (Signed)
PROGRESS NOTE  Andrea Wilkinson ZOX:[id number] DOB: 1931-08-02 DOA: 08/06/2018 PCP: Janith Lima, MD   LOS: 3 days   Brief Narrative / Interim history: 85 year old female with history of IDDM 2, CAD status post CABG in 03/2016, CKD 3 and hypertension who presented to ED after fall and subsequent left hip fracture.  Foley started to be mechanical.  Has prior history of falls.  Supposedly using cane but not consistent.  She underwent intramedullary fixation of left femur on 2/19.   Subjective: No major events overnight.  No complaints this morning.  Pain fairly controlled.  Has not had bowel movement yet.  Eating and drinking okay.  No urinary symptoms.  Denies chest pain, dyspnea, nausea, vomiting or abdominal pain.  Would prefer to go home with home health if possible.  Assessment & Plan: Principal Problem:   Hip fracture due to osteoporosis, initial encounter [facility]) Active Problems:   Essential hypertension   Diabetic retinopathy of left eye associated with type 2 diabetes mellitus ([place])   Cardiomyopathy, ischemic   CKD (chronic kidney disease) stage 3, GFR 30-59 ml/min (HCC)   CAD (coronary artery disease)   Hip fracture (HCC)   Closed comminuted intertrochanteric fracture of left femur (HCC)  Mechanical fall Left femoral fracture Osteoporosis: Status post intramedullary fixation of left femur on 2/19  -Appreciate orthopedic surgery's care. -Pain fairly controlled. -DVT prophylaxis and bowel regimen. -Need bisphosphonate, vitamin D and calcium supplementation for osteoporosis down the road.  -PT/OT eval-SNF.  Patient asks if she could go home with home health.   IDDM 2: Reportedly on Tresiba 20 units in the morning at home.  A1c 7.7%.  CBG fairly controlled -Continue high-SSI -Continue Lantus 10 units daily -CBG monitoring  History of CAD status post CABG in 2017: No cardiopulmonary symptoms. -Continue metoprolol, statin and aspirin.  Hypertension: Normotensive. -Continue  metoprolol and amlodipine as above  CKD 3: Mild creatinine elevation likely prerenal-improved.  She is not on nephrotoxic meds. -We will discontinue IV fluids -Encourage oral intake -Recheck renal function in the morning  Scheduled Meds: . amLODipine  5 mg Oral Daily  . aspirin EC  81 mg Oral Daily  . docusate sodium  100 mg Oral BID  . enoxaparin (LOVENOX) injection  30 mg Subcutaneous QAC breakfast  . insulin aspart  0-20 Units Subcutaneous TID WC  . insulin aspart  0-5 Units Subcutaneous QHS  . insulin glargine  10 Units Subcutaneous Daily  . metoprolol tartrate  37.5 mg Oral BID  . senna  1 tablet Oral BID   Continuous Infusions: . methocarbamol (ROBAXIN) IV     PRN Meds:.acetaminophen, HYDROmorphone (DILAUDID) injection, menthol-cetylpyridinium **OR** phenol, methocarbamol **OR** methocarbamol (ROBAXIN) IV, metoCLOPramide **OR** metoCLOPramide (REGLAN) injection, ondansetron **OR** ondansetron (ZOFRAN) IV, oxyCODONE, oxyCODONE  DVT prophylaxis: Subcu Lovenox Code Status: Full code Family Communication: None at bedside Disposition Plan: Remains inpatient pending insurance authorization for SNF placement.  Consultants:   Orthopedic surgery  Procedures:   Intramedullary fixation of left femur on 2/19  Antimicrobials:  Cefazolin on 2/19 -2/20  Objective: Vitals:   08/09/18 0522 08/09/18 1309 08/09/18 2153 08/10/18 0520  BP: (!) 115/51 (!) 110/54 (!) 124/52 (!) 128/56  Pulse: 75 67 73 71  Resp: 18 16 16    Temp: 98.7 F (37.1 C) 97.9 F (36.6 C) 98.4 F (36.9 C) 99.4 F (37.4 C)  TempSrc: Oral Oral Oral Oral  SpO2: 97% 91% 97% 96%  Weight:      Height:  Intake/Output Summary (Last 24 hours) at 08/10/2018 1052 Last data filed at 08/10/2018 1043 Gross per 24 hour  Intake 2700.71 ml  Output 1700 ml  Net 1000.71 ml   Filed Weights   08/07/18 0047 08/07/18 1706  Weight: 72.6 kg 72.6 kg    Examination:  GENERAL: Appears well. No acute distress.    HEENT: MMM.  Vision and Hearing grossly intact.  NECK: Supple.  No JVD.  LUNGS:  No IWOB. Good air movement. CTAB.  HEART:  RRR. Heart sounds normal.  ABD: Bowel sounds present. Soft. Non tender.  EXT:   no edema bilaterally.  SKIN: no apparent skin lesion.  NEURO: Awake, alert and oriented appropriately.  No gross deficit.  PSYCH: Calm. Normal affect.  Data Reviewed: I have independently reviewed following labs and imaging studies  CBC: Recent Labs  Lab 08/07/18 0109 08/08/18 0541 08/09/18 0538 08/10/18 0358  WBC 11.0* 9.2 9.0 7.8  NEUTROABS 9.7*  --   --   --   HGB 11.5* 9.9* 9.1* 8.1*  HCT 36.8 33.2* 29.8* 26.6*  MCV 94.4 98.5 97.7 98.9  PLT 199 179 160 884*   Basic Metabolic Panel: Recent Labs  Lab 08/07/18 0109 08/08/18 0541 08/09/18 0538 08/10/18 0358  NA 135 135 133* 138  K 4.1 4.3 4.5 4.1  CL 100 100 99 108  CO2 25 27 26 22   GLUCOSE 304* 202* 209* 155*  BUN 38* 35* 40* 43*  CREATININE 1.63* 1.66* 1.95* 1.83*  CALCIUM 9.0 8.8* 8.7* 8.3*   GFR: Estimated Creatinine Clearance: 20.3 mL/min (A) (by C-G formula based on SCr of 1.83 mg/dL (H)). Liver Function Tests: No results for input(s): AST, ALT, ALKPHOS, BILITOT, PROT, ALBUMIN in the last 168 hours. No results for input(s): LIPASE, AMYLASE in the last 168 hours. No results for input(s): AMMONIA in the last 168 hours. Coagulation Profile: Recent Labs  Lab 08/07/18 0109  INR 1.08   Cardiac Enzymes: No results for input(s): CKTOTAL, CKMB, CKMBINDEX, TROPONINI in the last 168 hours. BNP (last 3 results) No results for input(s): PROBNP in the last 8760 hours. HbA1C: No results for input(s): HGBA1C in the last 72 hours. CBG: Recent Labs  Lab 08/09/18 0745 08/09/18 1143 08/09/18 1752 08/09/18 2224 08/10/18 0833  GLUCAP 184* 215* 168* 202* 154*   Lipid Profile: No results for input(s): CHOL, HDL, LDLCALC, TRIG, CHOLHDL, LDLDIRECT in the last 72 hours. Thyroid Function Tests: No results for  input(s): TSH, T4TOTAL, FREET4, T3FREE, THYROIDAB in the last 72 hours. Anemia Panel: No results for input(s): VITAMINB12, FOLATE, FERRITIN, TIBC, IRON, RETICCTPCT in the last 72 hours. Urine analysis:    Component Value Date/Time   COLORURINE STRAW (A) 08/07/2018 0007   APPEARANCEUR CLEAR 08/07/2018 0007   LABSPEC 1.011 08/07/2018 0007   PHURINE 7.0 08/07/2018 0007   GLUCOSEU 150 (A) 08/07/2018 0007   GLUCOSEU NEGATIVE 05/01/2018 1226   HGBUR NEGATIVE 08/07/2018 0007   HGBUR negative 11/05/2009 0839   BILIRUBINUR NEGATIVE 08/07/2018 0007   BILIRUBINUR negative 05/01/2013 1044   KETONESUR NEGATIVE 08/07/2018 0007   PROTEINUR NEGATIVE 08/07/2018 0007   UROBILINOGEN 0.2 05/01/2018 1226   NITRITE NEGATIVE 08/07/2018 0007   LEUKOCYTESUR NEGATIVE 08/07/2018 0007   Sepsis Labs: Invalid input(s): PROCALCITONIN, LACTICIDVEN  Recent Results (from the past 240 hour(s))  Surgical pcr screen     Status: None   Collection Time: 08/07/18  2:19 PM  Result Value Ref Range Status   MRSA, PCR NEGATIVE NEGATIVE Final   Staphylococcus aureus NEGATIVE NEGATIVE  Final    Comment: (NOTE) The Xpert SA Assay (FDA approved for NASAL specimens in patients 65 years of age and older), is one component of a comprehensive surveillance program. It is not intended to diagnose infection nor to guide or monitor treatment. Performed at [facility], [place] [address] [place], [place] [address]       Radiology Studies: No results found.  Taye T. [facility] Pager [phone]  If 7PM-7AM, please contact night-coverage [email or web address] Password [facility] 08/10/2018, 10:52 AM

## 2018-08-11 LAB — GLUCOSE, CAPILLARY
GLUCOSE-CAPILLARY: 143 mg/dL — AB (ref 70–99)
Glucose-Capillary: 212 mg/dL — ABNORMAL HIGH (ref 70–99)
Glucose-Capillary: 139 mg/dL — ABNORMAL HIGH (ref 70–99)
Glucose-Capillary: 252 mg/dL — ABNORMAL HIGH (ref 70–99)

## 2018-08-11 LAB — BASIC METABOLIC PANEL
Anion gap: 6 (ref 5–15)
BUN: 42 mg/dL — ABNORMAL HIGH (ref 8–23)
CO2: 24 mmol/L (ref 22–32)
CREATININE: 1.64 mg/dL — AB (ref 0.44–1.00)
Calcium: 8.7 mg/dL — ABNORMAL LOW (ref 8.9–10.3)
Chloride: 108 mmol/L (ref 98–111)
GFR calc Af Amer: 33 mL/min — ABNORMAL LOW (ref >60)
GFR calc non Af Amer: 28 mL/min — ABNORMAL LOW (ref >60)
Glucose, Bld: 180 mg/dL — ABNORMAL HIGH (ref 70–99)
Potassium: 4.6 mmol/L (ref 3.5–5.1)
SODIUM: 138 mmol/L (ref 135–145)

## 2018-08-11 NOTE — Progress Notes (Signed)
Subjective: 4 Days Post-Op Procedure(s) (LRB): INTRAMEDULLARY (IM) NAIL FEMORAL LEFT (N/A) Patient reports pain as 2 on 0-10 scale. No major problems this morning. HBG 8.5 and stable.  Objective: Vital signs in last 24 hours: Temp:  [98.2 F (36.8 C)-98.9 F (37.2 C)] 98.6 F (37 C) (02/23 0500) Pulse Rate:  [73-88] 73 (02/23 0500) Resp:  [16-20] 18 (02/23 0500) BP: (122-140)/(52-73) 140/73 (02/23 0500) SpO2:  [97 %-99 %] 99 % (02/23 0500)  Intake/Output from previous day: 02/22 0701 - 02/23 0700 In: 2166.9 [P.O.:1660; I.V.:506.9] Out: 1650 [Urine:1650] Intake/Output this shift: Total I/O In: 240 [P.O.:240] Out: -   Recent Labs    08/09/18 0538 08/10/18 0358 08/10/18 1503  HGB 9.1* 8.1* 8.5*   Recent Labs    08/09/18 0538 08/10/18 0358 08/10/18 1503  WBC 9.0 7.8  --   RBC 3.05* 2.69*  --   HCT 29.8* 26.6* 27.9*  PLT 160 141*  --    Recent Labs    08/10/18 0358 08/11/18 0432  NA 138 138  K 4.1 4.6  CL 108 108  CO2 22 24  BUN 43* 42*  CREATININE 1.83* 1.64*  GLUCOSE 155* 180*  CALCIUM 8.3* 8.7*   No results for input(s): LABPT, INR in the last 72 hours.  Dorsiflexion/Plantar flexion intact   Assessment/Plan: 4 Days Post-Op Procedure(s) (LRB): INTRAMEDULLARY (IM) NAIL FEMORAL LEFT (N/A) Up with therapy      Latanya Maudlin 08/11/2018, 8:31 AM

## 2018-08-11 NOTE — Plan of Care (Signed)
  Problem: Activity: Goal: Ability to ambulate and perform ADLs will improve Outcome: Progressing   Problem: Clinical Measurements: Goal: Postoperative complications will be avoided or minimized Outcome: Progressing   Problem: Self-Concept: Goal: Ability to maintain and perform role responsibilities to the fullest extent possible will improve Outcome: Progressing   Problem: Clinical Measurements: Goal: Ability to maintain clinical measurements within normal limits will improve Outcome: Progressing Goal: Diagnostic test results will improve Outcome: Progressing   Problem: Activity: Goal: Risk for activity intolerance will decrease Outcome: Progressing   Problem: Nutrition: Goal: Adequate nutrition will be maintained Outcome: Progressing   Problem: Elimination: Goal: Will not experience complications related to bowel motility Outcome: Progressing   Problem: Pain Managment: Goal: General experience of comfort will improve Outcome: Progressing   Problem: Safety: Goal: Ability to remain free from injury will improve Outcome: Progressing

## 2018-08-11 NOTE — Progress Notes (Signed)
PROGRESS NOTE  Maybree Riling Bramer QAS:[id number] DOB: 24-Jan-1932 DOA: 08/06/2018 PCP: Janith Lima, MD   LOS: 4 days   Brief Narrative / Interim history: 85 year old female with history of IDDM 2, CAD status post CABG in 03/2016, CKD 3 and hypertension who presented to ED after fall and subsequent left hip fracture.  Foley started to be mechanical.  Has prior history of falls.  Supposedly using cane but not consistent.  She underwent intramedullary fixation of left femur on 2/19.   Subjective: No major events overnight of this morning.  No complaints this morning labs for some constipation.  Has nausea, vomiting or abdominal pain.  Has not had bowel movement but passing gases.  Denies dysuria.  Tolerating food.  Assessment & Plan: Principal Problem:   Hip fracture due to osteoporosis, initial encounter [facility]) Active Problems:   Essential hypertension   Diabetic retinopathy of left eye associated with type 2 diabetes mellitus (HCC)   Cardiomyopathy, ischemic   CKD (chronic kidney disease) stage 3, GFR 30-59 ml/min (HCC)   CAD (coronary artery disease)   Hip fracture (HCC)   Closed comminuted intertrochanteric fracture of left femur (HCC)  Mechanical fall Left femoral fracture Osteoporosis: Status post intramedullary fixation of left femur on 2/19  -Appreciate orthopedic surgery's care. -Pain well controlled. -DVT prophylaxis and bowel regimen. -Need bisphosphonate, vitamin D and calcium supplementation for osteoporosis down the road.  -PT/OT eval-recommended SNF.  She prefers to go home.  IDDM 2: Reportedly on Tresiba 20 units in the morning at home.  A1c 7.7%.  CBG fairly controlled -Continue high-SSI -Continue Lantus 10 units daily -CBG monitoring  History of CAD status post CABG in 2017: No cardiopulmonary symptoms. -Continue metoprolol, statin and aspirin.  Hypertension: Normotensive. -Continue metoprolol and amlodipine as above  CKD 3: Stable. -Encourage oral  intake  Scheduled Meds: . amLODipine  5 mg Oral Daily  . aspirin EC  81 mg Oral Daily  . docusate sodium  100 mg Oral BID  . enoxaparin (LOVENOX) injection  30 mg Subcutaneous QAC breakfast  . insulin aspart  0-20 Units Subcutaneous TID WC  . insulin aspart  0-5 Units Subcutaneous QHS  . insulin glargine  10 Units Subcutaneous Daily  . metoprolol tartrate  37.5 mg Oral BID  . senna  1 tablet Oral BID   Continuous Infusions: . methocarbamol (ROBAXIN) IV     PRN Meds:.acetaminophen, HYDROmorphone (DILAUDID) injection, menthol-cetylpyridinium **OR** phenol, methocarbamol **OR** methocarbamol (ROBAXIN) IV, metoCLOPramide **OR** metoCLOPramide (REGLAN) injection, ondansetron **OR** ondansetron (ZOFRAN) IV, oxyCODONE, oxyCODONE  DVT prophylaxis: Subcu Lovenox Code Status: Full code Family Communication: None at bedside Disposition Plan: Home with home health versus SNF  Consultants:   Orthopedic surgery  Procedures:   Intramedullary fixation of left femur on 2/19  Antimicrobials:  Cefazolin on 2/19 -2/20  Objective: Vitals:   08/10/18 1304 08/10/18 1825 08/10/18 2006 08/11/18 0500  BP: (!) 122/52 139/63 (!) 135/59 140/73  Pulse: 77 88 83 73  Resp: 20 20 16 18   Temp: 98.2 F (36.8 C) 98.5 F (36.9 C) 98.9 F (37.2 C) 98.6 F (37 C)  TempSrc: Oral Oral Oral Oral  SpO2: 97% 99% 99% 99%  Weight:      Height:        Intake/Output Summary (Last 24 hours) at 08/11/2018 0939 Last data filed at 08/11/2018 0733 Gross per 24 hour  Intake 2166.86 ml  Output 1050 ml  Net 1116.86 ml   Filed Weights   08/07/18 0047 08/07/18 1706  Weight:  72.6 kg 72.6 kg    Examination: GENERAL: Appears well. No acute distress.  HEENT: MMM.  Vision and Hearing grossly intact.  NECK: Supple.  LUNGS:  No IWOB. Good air movement. CTAB.  HEART:  RRR. Heart sounds normal.  ABD: Bowel sounds present. Soft. Non tender.  EXT:   no edema bilaterally.  Moves extremities. SKIN: no apparent skin  lesion.  NEURO: Awake, alert and oriented appropriately.  No gross deficit.  PSYCH: Calm. Normal affect.  Data Reviewed: I have independently reviewed following labs and imaging studies  CBC: Recent Labs  Lab 08/07/18 0109 08/08/18 0541 08/09/18 0538 08/10/18 0358 08/10/18 1503  WBC 11.0* 9.2 9.0 7.8  --   NEUTROABS 9.7*  --   --   --   --   HGB 11.5* 9.9* 9.1* 8.1* 8.5*  HCT 36.8 33.2* 29.8* 26.6* 27.9*  MCV 94.4 98.5 97.7 98.9  --   PLT 199 179 160 141*  --    Basic Metabolic Panel: Recent Labs  Lab 08/07/18 0109 08/08/18 0541 08/09/18 0538 08/10/18 0358 08/11/18 0432  NA 135 135 133* 138 138  K 4.1 4.3 4.5 4.1 4.6  CL 100 100 99 108 108  CO2 25 27 26 22 24   GLUCOSE 304* 202* 209* 155* 180*  BUN 38* 35* 40* 43* 42*  CREATININE 1.63* 1.66* 1.95* 1.83* 1.64*  CALCIUM 9.0 8.8* 8.7* 8.3* 8.7*   GFR: Estimated Creatinine Clearance: 22.7 mL/min (A) (by C-G formula based on SCr of 1.64 mg/dL (H)). Liver Function Tests: No results for input(s): AST, ALT, ALKPHOS, BILITOT, PROT, ALBUMIN in the last 168 hours. No results for input(s): LIPASE, AMYLASE in the last 168 hours. No results for input(s): AMMONIA in the last 168 hours. Coagulation Profile: Recent Labs  Lab 08/07/18 0109  INR 1.08   Cardiac Enzymes: No results for input(s): CKTOTAL, CKMB, CKMBINDEX, TROPONINI in the last 168 hours. BNP (last 3 results) No results for input(s): PROBNP in the last 8760 hours. HbA1C: No results for input(s): HGBA1C in the last 72 hours. CBG: Recent Labs  Lab 08/10/18 0833 08/10/18 1305 08/10/18 1822 08/10/18 2024 08/11/18 0808  GLUCAP 154* 162* 192* 206* 143*   Lipid Profile: No results for input(s): CHOL, HDL, LDLCALC, TRIG, CHOLHDL, LDLDIRECT in the last 72 hours. Thyroid Function Tests: No results for input(s): TSH, T4TOTAL, FREET4, T3FREE, THYROIDAB in the last 72 hours. Anemia Panel: No results for input(s): VITAMINB12, FOLATE, FERRITIN, TIBC, IRON, RETICCTPCT  in the last 72 hours. Urine analysis:    Component Value Date/Time   COLORURINE STRAW (A) 08/07/2018 0007   APPEARANCEUR CLEAR 08/07/2018 0007   LABSPEC 1.011 08/07/2018 0007   PHURINE 7.0 08/07/2018 0007   GLUCOSEU 150 (A) 08/07/2018 0007   GLUCOSEU NEGATIVE 05/01/2018 1226   HGBUR NEGATIVE 08/07/2018 0007   HGBUR negative 11/05/2009 0839   BILIRUBINUR NEGATIVE 08/07/2018 0007   BILIRUBINUR negative 05/01/2013 1044   KETONESUR NEGATIVE 08/07/2018 0007   PROTEINUR NEGATIVE 08/07/2018 0007   UROBILINOGEN 0.2 05/01/2018 1226   NITRITE NEGATIVE 08/07/2018 0007   LEUKOCYTESUR NEGATIVE 08/07/2018 0007   Sepsis Labs: Invalid input(s): PROCALCITONIN, LACTICIDVEN  Recent Results (from the past 240 hour(s))  Surgical pcr screen     Status: None   Collection Time: 08/07/18  2:19 PM  Result Value Ref Range Status   MRSA, PCR NEGATIVE NEGATIVE Final   Staphylococcus aureus NEGATIVE NEGATIVE Final    Comment: (NOTE) The Xpert SA Assay (FDA approved for NASAL specimens in patients 22 years  of age and older), is one component of a comprehensive surveillance program. It is not intended to diagnose infection nor to guide or monitor treatment. Performed at [facility], [place] [address], [place], [place] [address]   Urine Culture     Status: None   Collection Time: 08/09/18  1:24 PM  Result Value Ref Range Status   Specimen Description   Final    URINE, CATHETERIZED Performed at [place] [address], [place], [place] [address]    Special Requests   Final    Normal Performed at [facility], [place] [address], [place], [place] [address]    Culture   Final    NO GROWTH Performed at [facility], [place] [address], [place],  [address]    Report Status 08/10/2018 FINAL  Final      Radiology Studies: No results found.  Taye T. [facility] Pager [phone]  If 7PM-7AM, please contact  night-coverage [email or web address] Password [facility] 08/11/2018, 9:39 AM

## 2018-08-11 NOTE — Plan of Care (Signed)
Pt stable with no needs at time of assessment. No changes needed to care plans. Pt remains weak, tired, and confused. Pt does not follow commands this am.

## 2018-08-12 DIAGNOSIS — I255 Ischemic cardiomyopathy: Secondary | ICD-10-CM | POA: Diagnosis not present

## 2018-08-12 DIAGNOSIS — S72142D Displaced intertrochanteric fracture of left femur, subsequent encounter for closed fracture with routine healing: Secondary | ICD-10-CM | POA: Diagnosis not present

## 2018-08-12 DIAGNOSIS — E11311 Type 2 diabetes mellitus with unspecified diabetic retinopathy with macular edema: Secondary | ICD-10-CM | POA: Diagnosis not present

## 2018-08-12 DIAGNOSIS — S72142A Displaced intertrochanteric fracture of left femur, initial encounter for closed fracture: Secondary | ICD-10-CM | POA: Diagnosis not present

## 2018-08-12 DIAGNOSIS — W19XXXD Unspecified fall, subsequent encounter: Secondary | ICD-10-CM | POA: Diagnosis not present

## 2018-08-12 DIAGNOSIS — E1151 Type 2 diabetes mellitus with diabetic peripheral angiopathy without gangrene: Secondary | ICD-10-CM | POA: Diagnosis not present

## 2018-08-12 DIAGNOSIS — I10 Essential (primary) hypertension: Secondary | ICD-10-CM | POA: Diagnosis not present

## 2018-08-12 DIAGNOSIS — M80052D Age-related osteoporosis with current pathological fracture, left femur, subsequent encounter for fracture with routine healing: Secondary | ICD-10-CM | POA: Diagnosis not present

## 2018-08-12 DIAGNOSIS — Z7401 Bed confinement status: Secondary | ICD-10-CM | POA: Diagnosis not present

## 2018-08-12 DIAGNOSIS — I2510 Atherosclerotic heart disease of native coronary artery without angina pectoris: Secondary | ICD-10-CM | POA: Diagnosis not present

## 2018-08-12 DIAGNOSIS — M25552 Pain in left hip: Secondary | ICD-10-CM | POA: Diagnosis not present

## 2018-08-12 DIAGNOSIS — K5909 Other constipation: Secondary | ICD-10-CM | POA: Diagnosis not present

## 2018-08-12 DIAGNOSIS — M2550 Pain in unspecified joint: Secondary | ICD-10-CM | POA: Diagnosis not present

## 2018-08-12 DIAGNOSIS — Z5189 Encounter for other specified aftercare: Secondary | ICD-10-CM | POA: Diagnosis not present

## 2018-08-12 DIAGNOSIS — N183 Chronic kidney disease, stage 3 (moderate): Secondary | ICD-10-CM | POA: Diagnosis not present

## 2018-08-12 DIAGNOSIS — Z4789 Encounter for other orthopedic aftercare: Secondary | ICD-10-CM | POA: Diagnosis not present

## 2018-08-12 DIAGNOSIS — M80059A Age-related osteoporosis with current pathological fracture, unspecified femur, initial encounter for fracture: Secondary | ICD-10-CM | POA: Diagnosis not present

## 2018-08-12 DIAGNOSIS — R531 Weakness: Secondary | ICD-10-CM | POA: Diagnosis not present

## 2018-08-12 LAB — RETICULOCYTES
Immature Retic Fract: 27.8 % — ABNORMAL HIGH (ref 2.3–15.9)
RBC.: 2.99 MIL/uL — ABNORMAL LOW (ref 3.87–5.11)
RETIC CT PCT: 3.2 % — AB (ref 0.4–3.1)
Retic Count, Absolute: 94.2 10*3/uL (ref 19.0–186.0)

## 2018-08-12 LAB — HEMOGLOBIN AND HEMATOCRIT, BLOOD
HCT: 28.3 % — ABNORMAL LOW (ref 36.0–46.0)
HEMOGLOBIN: 8.5 g/dL — AB (ref 12.0–15.0)

## 2018-08-12 LAB — BASIC METABOLIC PANEL
Anion gap: 7 (ref 5–15)
BUN: 35 mg/dL — ABNORMAL HIGH (ref 8–23)
CO2: 24 mmol/L (ref 22–32)
Calcium: 8.8 mg/dL — ABNORMAL LOW (ref 8.9–10.3)
Chloride: 107 mmol/L (ref 98–111)
Creatinine, Ser: 1.51 mg/dL — ABNORMAL HIGH (ref 0.44–1.00)
GFR calc Af Amer: 36 mL/min — ABNORMAL LOW (ref >60)
GFR calc non Af Amer: 31 mL/min — ABNORMAL LOW (ref >60)
GLUCOSE: 164 mg/dL — AB (ref 70–99)
Potassium: 4.2 mmol/L (ref 3.5–5.1)
Sodium: 138 mmol/L (ref 135–145)

## 2018-08-12 LAB — VITAMIN B12: Vitamin B-12: 299 pg/mL (ref 180–914)

## 2018-08-12 LAB — GLUCOSE, CAPILLARY
Glucose-Capillary: 142 mg/dL — ABNORMAL HIGH (ref 70–99)
Glucose-Capillary: 168 mg/dL — ABNORMAL HIGH (ref 70–99)

## 2018-08-12 LAB — IRON AND TIBC
Iron: 45 ug/dL (ref 28–170)
Saturation Ratios: 15 % (ref 10.4–31.8)
TIBC: 293 ug/dL (ref 250–450)
UIBC: 248 ug/dL

## 2018-08-12 LAB — FERRITIN: Ferritin: 31 ng/mL (ref 11–307)

## 2018-08-12 LAB — FOLATE: Folate: 22.5 ng/mL (ref >5.9)

## 2018-08-12 MED ORDER — SENNA 8.6 MG PO TABS: 1.0000 | ORAL_TABLET | Freq: Every day | ORAL | 0 refills | Status: AC | PRN

## 2018-08-12 NOTE — Care Management Important Message (Signed)
Important Message  Patient Details  Name: Andrea Wilkinson MRN: [id number] Date of Birth: 1931/09/09   Medicare Important Message Given:  Yes    Kerin Salen 08/12/2018, 1:00 [place] Message  Patient Details  Name: Andrea Wilkinson MRN: [id number] Date of Birth: 1932/01/18   Medicare Important Message Given:  Yes    Kerin Salen 08/12/2018, 1:00 PM

## 2018-08-12 NOTE — Clinical Social Work Placement (Signed)
D/C Summary sent Nurse call report to:[phone] Room 211  CLINICAL SOCIAL WORK PLACEMENT  NOTE  Date:  08/12/2018  Patient Details  Name: Andrea Wilkinson MRN: [id number] Date of Birth: 1931-07-07  Clinical Social Work is seeking post-discharge placement for this patient at the [place] level of care (*CSW will initial, date and re-position this form in  chart as items are completed):  Yes   Patient/family provided with [place] Work Department's list of facilities offering this level of care within the geographic area requested by the patient (or if unable, by the patient's family).  Yes   Patient/family informed of their freedom to choose among providers that offer the needed level of care, that participate in Medicare, Medicaid or managed care program needed by the patient, have an available bed and are willing to accept the patient.  Yes   Patient/family informed of [facility]'s ownership interest in [facility], as well as of the fact that they are under no obligation to receive care at these facilities.  PASRR submitted to EDS on       PASRR number received on       Existing PASRR number confirmed on       FL2 transmitted to all facilities in geographic area requested by pt/family on       FL2 transmitted to all facilities within larger geographic area on 08/12/18     Patient informed that his/her managed care company has contracts with or will negotiate with certain facilities, including the following:  [facility], [facility]     Yes   Patient/family informed of bed offers received.  Patient chooses bed at [place], [place]     Physician recommends and patient chooses bed at      Patient to be transferred to [facility] on 08/12/18.  Patient to be transferred to facility by [facility]      Patient family notified on 08/12/18 of transfer.  Name of family member notified:  Granddaughter   Leah     PHYSICIAN       Additional Comment:    _______________________________________________ Lia Hopping, [place] 08/12/2018, 12:01 PM

## 2018-08-12 NOTE — Progress Notes (Signed)
Report called to Jackelyn Poling, Therapist, sports at [facility].  Patient stable.  Awaiting transport via [place].

## 2018-08-12 NOTE — Progress Notes (Signed)
Physical Therapy Treatment Patient Details Name: Andrea Wilkinson MRN: [id number] DOB: March 31, 1932 Today's Date: 08/12/2018    History of Present Illness 85 y.o. female who complains of left hip pain after she fell at home, s/p L  IM nail per Dr. Lyla Glassing on 08/07/18; PMH: CAD, memory loss, HTN, bil TKA, MI    PT Comments    POD # 5 Pt's cognition has improved, following all commands and aware she fell.  Pt was OOB in recliner. Educated pt on her hip surgery and WBAT.  Assisted out of recliner to amb to bathroom.  General Gait Details: 50% VC's on proper walker to self distance and upright posture.  very short shuffled steps and 25% assist to navigate walker around doorways and complete turns (toilet/recliner)  Assisted with peri care as pt was unable to maintain a safe balance and self perform.  Assisted with static standing at sink and educated on proper walker placement.  Assisted back to recliner and positioned to comfort.  Pt progressing slowly and will need ST Rehab at SNF prior to returning home alone.   Follow Up Recommendations  SNF     Equipment Recommendations  None recommended by PT    Recommendations for Other Services       Precautions / Restrictions Precautions Precautions: Fall Restrictions Weight Bearing Restrictions: No Other Position/Activity Restrictions: WBAT    Mobility  Bed Mobility               General bed mobility comments: OOB in recliner   Transfers Overall transfer level: Needs assistance Equipment used: Rolling walker (2 wheeled) Transfers: Sit to/from [facility] Sit to Stand: Mod assist;Min assist Stand pivot transfers: Mod assist       General transfer comment: 50% VC's on proper hand placement and safety with turns  Ambulation/Gait Ambulation/Gait assistance: Min assist;Mod assist Gait Distance (Feet): 22 Feet(11 feet x 2 to and from bathroom) Assistive device: Rolling walker (2 wheeled) Gait Pattern/deviations:  Step-to pattern;Antalgic;Decreased stance time - left Gait velocity: decreased    General Gait Details: 50% VC's on proper walker to self distance and upright posture.  very short shuffled steps and 25% assist to navigate walker around doorways and complete turns Equities trader)    Marine scientist [place] (Stroke Patients Only)       Balance                                            Cognition Arousal/Alertness: Awake/alert Behavior During Therapy: WFL for tasks assessed/performed Overall Cognitive Status: Within Functional Limits for tasks assessed                                 General Comments: appears at prior level      Exercises      General Comments        Pertinent Vitals/Pain Pain Assessment: Faces Faces Pain Scale: Hurts a little bit Pain Location: L hip Pain Descriptors / Indicators: Guarding;Grimacing;Sore Pain Intervention(s): Monitored during session;Repositioned;Premedicated before session    Home Living                      Prior Function  PT Goals (current goals can now be found in the care plan section) Progress towards PT goals: Progressing toward goals    Frequency    Min 3X/week      PT Plan Current plan remains appropriate    Co-evaluation              AM-PAC PT "6 Clicks" Mobility   Outcome Measure  Help needed turning from your back to your side while in a flat bed without using bedrails?: A Lot Help needed moving from lying on your back to sitting on the side of a flat bed without using bedrails?: A Lot Help needed moving to and from a bed to a chair (including a wheelchair)?: A Lot Help needed standing up from a chair using your arms (e.g., wheelchair or bedside chair)?: A Lot Help needed to walk in hospital room?: A Lot Help needed climbing 3-5 steps with a railing? : Total 6 Click Score: 11    End of Session  Equipment Utilized During Treatment: Gait belt Activity Tolerance: Patient tolerated treatment well Patient left: in chair;with call bell/phone within reach;with chair alarm set   PT Visit Diagnosis: Difficulty in walking, not elsewhere classified (R26.2)     Time: 5643-3295 PT Time Calculation (min) (ACUTE ONLY): 30 min  Charges:  $Gait Training: 8-22 mins $Therapeutic Activity: 8-22 mins                     Rica Koyanagi  PTA Acute  Rehabilitation Services Pager      [phone] Office      [phone]

## 2018-08-12 NOTE — Discharge Summary (Addendum)
Physician Discharge Summary  Andrea Wilkinson EXN:[id number] DOB: 10-06-1931 DOA: 08/06/2018  PCP: Janith Lima, MD  Admit date: 08/06/2018 Discharge date: 08/12/2018  Admitted From: Home Disposition: SNF  Recommendations for Outpatient Follow-up:  1. Follow up with orthopedic surgery in 2 weeks 2. Please obtain BMP/CBC in one week 3. Please follow up on the following pending results:  Discharge Condition: Stable CODE STATUS: Full code  Hospital Course: 85 year old female with history of IDDM 2, CAD status post CABG in 03/2016, CKD 3 and hypertension who presented to ED after fall and subsequent left hip fracture.  Foley started to be mechanical.  Has prior history of falls.  Supposedly using cane but not consistent.  She underwent intramedullary fixation of left femur on 2/19 by Dr. Lyla Glassing.  Patient had fairly uncomplicated postop course.  Evaluated by physical therapy who recommended disposition to SNF.  Patient discharged on subcu Lovenox for 30 days for VT prophylaxis, and oxycodone for pain.  See individual problem list below for more.  Discharge Diagnoses:  Principal Problem:   Hip fracture due to osteoporosis, initial encounter [facility]) Active Problems:   Essential hypertension   Diabetic retinopathy of left eye associated with type 2 diabetes mellitus (HCC)   Cardiomyopathy, ischemic   CKD (chronic kidney disease) stage 3, GFR 30-59 ml/min (HCC)   CAD (coronary artery disease)   Hip fracture (HCC)   Closed comminuted intertrochanteric fracture of left femur (HCC)  Mechanical fall Left femoral fracture Osteoporosis: Status post intramedullary fixation of left femur on 08/07/18 by Dr. Lyla Glassing -Appreciate orthopedic surgery's care -Pain well controlled-given Rx for PRN oxycodone -Subcu Lovenox for VT prophylaxis for 30 days -Weightbearing as tolerated per orthopedic surgery -Need bisphosphonate, vitamin D and calcium supplementation for osteoporosis down the road.    -Orthopedic surgery follow-up in 2 weeks. -Physical therapy  IDDM 2: Reportedly on Tresiba 20 units in the morning at home.  A1c 7.7%.  CBG fairly controlled -Discharged on home regimen.  History of CAD status post CABG in 2017: No cardiopulmonary symptoms. -Continue metoprolol, statin and aspirin.  Hypertension: Normotensive. -Continue metoprolol and amlodipine as above  CKD 3: Stable. -Encourage oral intake  Anemia of chronic disease: Hemoglobin stable at 8.5 on discharge.  Anemia panel not impressive. -Recheck CBC at follow-up.  Discharge Instructions  Discharge Instructions    Call MD for:  persistant nausea and vomiting   Complete by:  As directed    Call MD for:  redness, tenderness, or signs of infection (pain, swelling, redness, odor or green/yellow discharge around incision site)   Complete by:  As directed    Call MD for:  severe uncontrolled pain   Complete by:  As directed    Call MD for:  temperature >100.4   Complete by:  As directed    Diet - low sodium heart healthy   Complete by:  As directed    Increase activity slowly   Complete by:  As directed      Allergies as of 08/12/2018      Reactions   Codeine Nausea And Vomiting      Medication List    STOP taking these medications   traMADol 50 MG tablet Commonly known as:  ULTRAM     TAKE these medications   ACCU-CHEK AVIVA PLUS w/Device Kit Use to check blood sugars daily   ACCU-CHEK AVIVA Soln   accu-chek soft touch lancets Use to check blood sugar bid. DX: E11.8   aspirin 81 MG EC tablet  Take 1 tablet (81 mg total) by mouth daily.   atorvastatin 10 MG tablet Commonly known as:  LIPITOR Take 1 tablet (10 mg total) by mouth daily.   B-D SINGLE USE SWABS REGULAR Pads   enoxaparin 30 MG/0.3ML injection Commonly known as:  LOVENOX Inject 0.3 mLs (30 mg total) into the skin daily before breakfast.   EQL FISH OIL PO Take 1 tablet by mouth daily.   glucose blood test strip 1 each  by Other route 2 (two) times daily. DX: E11.8   Insulin Degludec 200 UNIT/ML Sopn Commonly known as:  TRESIBA FLEXTOUCH Inject 20 Units into the skin daily.   omeprazole 20 MG capsule Commonly known as:  PRILOSEC TAKE 1 CAPSULE EVERY DAY   oxyCODONE 5 MG immediate release tablet Commonly known as:  Oxy IR/ROXICODONE Take 1 tablet (5 mg total) by mouth every 4 (four) hours as needed for moderate pain (pain score 4-6).   senna 8.6 MG Tabs tablet Commonly known as:  SENOKOT Take 1 tablet (8.6 mg total) by mouth daily as needed for mild constipation or moderate constipation.       Contact information for follow-up providers    Swinteck, Aaron Edelman, MD. Schedule an appointment as soon as possible for a visit in 2 weeks.   Specialty:  Orthopedic Surgery Why:  For wound re-check, For suture removal Contact information: [address] [place] [address] [phone]            Contact information for after-discharge care    Destination    [facility] Preferred SNF .   Service:  Skilled Nursing Contact information: [place] [phone]                  Consultations:  Orthopedic surgery  Procedures/Studies:  2D Echo: None obtained this admission.  Ct Head Wo Contrast  Result Date: 08/07/2018 CLINICAL DATA:  85 year old female status post fall with head trauma. EXAM: CT HEAD WITHOUT CONTRAST TECHNIQUE: Contiguous axial images were obtained from the base of the skull through the vertex without intravenous contrast. COMPARISON:  Head and cervical spine CT 06/11/2015. FINDINGS: Brain: Stable cerebral volume, normal for age. Chronic Patchy and confluent cerebral white matter hypodensity appears stable. No midline shift, ventriculomegaly, mass effect, evidence of mass lesion, intracranial hemorrhage or evidence of cortically based acute infarction. Vascular: Extensive calcified intracranial  artery atherosclerosis with superimposed arterial ectasia. Skull: No acute osseous abnormality identified. Sinuses/Orbits: Visualized paranasal sinuses and mastoids are stable and well pneumatized. Other: No scalp hematoma identified. Stable orbits. IMPRESSION: 1. No acute intracranial abnormality or acute traumatic injury identified. 2. 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 Electronically Signed   By: Genevie Ann M.D.   On: 08/07/2018 00:30   Pelvis Portable  Result Date: 08/07/2018 CLINICAL DATA:  Postop EXAM: PORTABLE PELVIS 1-2 VIEWS COMPARISON:  CT 08/07/2018, radiograph 08/07/2018 FINDINGS: Interval intramedullary rod and distal screw fixation of the left femur for intertrochanteric fracture. Displaced lesser trochanteric fracture fragment. Gas in the soft tissues consistent with recent surgery. Vascular calcification. IMPRESSION: Interval surgical fixation of intertrochanteric left femur fracture. Electronically Signed   By: Donavan Foil M.D.   On: 08/07/2018 20:19   Ct Hip Left Wo Contrast  Result Date: 08/07/2018 CLINICAL DATA:  Left hip pain since a fall when the patient stood up yesterday. Initial encounter. EXAM: CT OF THE LEFT HIP WITHOUT CONTRAST TECHNIQUE: Multidetector CT imaging of the left  hip was performed according to the standard protocol. Multiplanar CT image reconstructions were also generated. COMPARISON:  Plain films left hip 08/07/2018. FINDINGS: Bones/Joint/Cartilage As seen on the comparison plain films, the patient has a mildly comminuted intertrochanteric fracture of the left hip. The lesser trochanter is a separate fragment and medially displaced approximately 1 cm. There is mild varus angulation about the fracture. No other fracture is identified. There is some joint space narrowing about the left hip with subchondral sclerosis. The superior and anterior labrum are calcified. Ligaments Suboptimally assessed by  CT. Muscles and Tendons Intact. Calcifications at the left hamstring origin consistent with chronic tendinosis noted. Soft tissues Imaged intrapelvic contents are unremarkable. IMPRESSION: Mildly comminuted left intertrochanteric fracture as described above. Electronically Signed   By: Inge Rise M.D.   On: 08/07/2018 09:53   Dg Knee Left Port  Result Date: 08/07/2018 CLINICAL DATA:  Preop. EXAM: PORTABLE LEFT KNEE - 1-2 VIEW COMPARISON:  None. FINDINGS: The patient is status post TKR. Additional lag screw has been placed through the distal femur. No fracture or loosening. No effusion. Vascular calcification. IMPRESSION: No active disease. Electronically Signed   By: Staci Righter M.D.   On: 08/07/2018 09:25   Dg C-arm 1-60 Min-no Report  Result Date: 08/07/2018 Fluoroscopy was utilized by the requesting physician.  No radiographic interpretation.   Dg Hip Operative Unilat W Or W/o Pelvis Left  Result Date: 08/07/2018 CLINICAL DATA:  85 year old female undergoing proximal left femur ORIF. EXAM: OPERATIVE LEFT HIP (WITH PELVIS IF PERFORMED) 7 VIEWS TECHNIQUE: Fluoroscopic spot image(s) were submitted for interpretation post-operatively. COMPARISON:  Left hip series earlier today. FLUOROSCOPY TIME:  0 minutes 46 seconds FINDINGS: Seven intraoperative fluoroscopic spot views of the left hip demonstrate placement of a left femur intramedullary rod with proximal interlocking dynamic hip screw and distal interlocking cortical screw. Hardware appears intact. IMPRESSION: Proximal left femur ORIF with no adverse features identified. Electronically Signed   By: Genevie Ann M.D.   On: 08/07/2018 19:47   Dg Hip Unilat W Or W/o Pelvis 2-3 Views Left  Result Date: 08/07/2018 CLINICAL DATA:  Left hip pain EXAM: DG HIP (WITH OR WITHOUT PELVIS) 2-3V LEFT COMPARISON:  None. FINDINGS: There is a medially displaced fracture of the lesser trochanter of the left femur. There is lucency extending along the  intertrochanteric line without displacement. Cross-table lateral views are limited by patient body habitus. No pelvic ring fracture. IMPRESSION: Medially displaced fracture of the left femur lesser trochanter with lucency along the intertrochanteric line possibly indicating nondisplaced intertrochanteric fracture. Electronically Signed   By: Ulyses Jarred M.D.   On: 08/07/2018 00:58     Subjective: No major events overnight of this morning.  No complaint this morning.  Denies chest pain, dyspnea, abdominal pain or dysuria.  Had a bowel movement.  Discharge Exam: Vitals:   08/12/18 0605 08/12/18 0919  BP: (!) 126/57 128/66  Pulse: 73 78  Resp: 16   Temp: 98.5 F (36.9 C)   SpO2: 98% 97%    GENERAL: Appears well. No acute distress.  HEENT: MMM.  Vision and Hearing grossly intact.  NECK: Supple.  No JVD.  LUNGS:  No IWOB. Good air movement. CTAB.  HEART:  RRR. Heart sounds normal. ABD: Bowel sounds present. Soft. Non tender.  EXT:   no edema bilaterally.  Moves extremities. SKIN: no apparent skin lesion.  NEURO: Awake, alert and oriented appropriately.  No gross deficit.  PSYCH: Calm. Normal affect.   The results of significant  diagnostics from this hospitalization (including imaging, microbiology, ancillary and laboratory) are listed below for reference.     Microbiology: Recent Results (from the past 240 hour(s))  Surgical pcr screen     Status: None   Collection Time: 08/07/18  2:19 PM  Result Value Ref Range Status   MRSA, PCR NEGATIVE NEGATIVE Final   Staphylococcus aureus NEGATIVE NEGATIVE Final    Comment: (NOTE) The Xpert SA Assay (FDA approved for NASAL specimens in patients 12 years of age and older), is one component of a comprehensive surveillance program. It is not intended to diagnose infection nor to guide or monitor treatment. Performed at [facility], [place] [address], [place], [place] [address]   Urine Culture     Status: None    Collection Time: 08/09/18  1:24 PM  Result Value Ref Range Status   Specimen Description   Final    URINE, CATHETERIZED Performed at [place] [address] [place], [place] [address]    Special Requests   Final    Normal Performed at [facility], [place] [address] [place], [place] [address]    Culture   Final    NO GROWTH Performed at [facility], [place] [address], [place],  [address]    Report Status 08/10/2018 FINAL  Final     Labs: BNP (last 3 results) No results for input(s): BNP in the last 8760 hours. Basic Metabolic Panel: Recent Labs  Lab 08/08/18 0541 08/09/18 0538 08/10/18 0358 08/11/18 0432 08/12/18 0510  NA 135 133* 138 138 138  K 4.3 4.5 4.1 4.6 4.2  CL 100 99 108 108 107  CO2 _0 GLUCOSE 202* 209* 155* 180* 164*  BUN 35* 40* 43* 42* 35*  CREATININE 1.66* 1.95* 1.83* 1.64* 1.51*  CALCIUM 8.8* 8.7* 8.3* 8.7* 8.8*   Liver Function Tests: No results for input(s): AST, ALT, ALKPHOS, BILITOT, PROT, ALBUMIN in the last 168 hours. No results for input(s): LIPASE, AMYLASE in the last 168 hours. No results for input(s): AMMONIA in the last 168 hours. CBC: Recent Labs  Lab 08/07/18 0109 08/08/18 0541 08/09/18 0538 08/10/18 0358 08/10/18 1503 08/12/18 0510  WBC 11.0* 9.2 9.0 7.8  --   --   NEUTROABS 9.7*  --   --   --   --   --   HGB 11.5* 9.9* 9.1* 8.1* 8.5* 8.5*  HCT 36.8 33.2* 29.8* 26.6* 27.9* 28.3*  MCV 94.4 98.5 97.7 98.9  --   --   PLT 199 179 160 141*  --   --    Cardiac Enzymes: No results for input(s): CKTOTAL, CKMB, CKMBINDEX, TROPONINI in the last 168 hours. BNP: Invalid input(s): POCBNP CBG: Recent Labs  Lab 08/11/18 0808 08/11/18 1202 08/11/18 1759 08/11/18 2048 08/12/18 0741  GLUCAP 143* 212* 139* 252* 142*   D-Dimer No results for input(s): DDIMER in the last 72 hours. Hgb A1c No results for input(s): HGBA1C in the last 72 hours. Lipid Profile No results  for input(s): CHOL, HDL, LDLCALC, TRIG, CHOLHDL, LDLDIRECT in the last 72 hours. Thyroid function studies No results for input(s): TSH, T4TOTAL, T3FREE, THYROIDAB in the last 72 hours.  Invalid input(s): FREET3 Anemia work up Recent Labs    08/12/18 0757  VITAMINB12 299  FOLATE 22.5  FERRITIN 31  TIBC 293  IRON 45  RETICCTPCT 3.2*   Urinalysis    Component Value Date/Time   COLORURINE STRAW (A) 08/07/2018 0007  APPEARANCEUR CLEAR 08/07/2018 0007   LABSPEC 1.011 08/07/2018 0007   PHURINE 7.0 08/07/2018 0007   GLUCOSEU 150 (A) 08/07/2018 0007   GLUCOSEU NEGATIVE 05/01/2018 1226   HGBUR NEGATIVE 08/07/2018 0007   HGBUR negative 11/05/2009 0839   BILIRUBINUR NEGATIVE 08/07/2018 0007   BILIRUBINUR negative 05/01/2013 1044   KETONESUR NEGATIVE 08/07/2018 0007   PROTEINUR NEGATIVE 08/07/2018 0007   UROBILINOGEN 0.2 05/01/2018 1226   NITRITE NEGATIVE 08/07/2018 0007   LEUKOCYTESUR NEGATIVE 08/07/2018 0007   Sepsis Labs Invalid input(s): PROCALCITONIN,  WBC,  LACTICIDVEN   Time coordinating discharge: 35 minutes  SIGNED:  Mercy Riding, MD  [facility] 08/12/2018, 11:57 AM Pager [phone]  If 7PM-7AM, please contact night-coverage [email or web address] Password [email or web address]

## 2018-08-12 NOTE — Progress Notes (Signed)
    Subjective:  Patient reports pain as mild to moderate.  Denies N/V/CP/SOB. No c/o.  Objective:   VITALS:   Vitals:   08/11/18 1412 08/11/18 2046 08/12/18 0605 08/12/18 0919  BP: (!) 114/59 (!) 138/57 (!) 126/57 128/66  Pulse: 73 80 73 78  Resp: 16 16 16    Temp: 98.5 F (36.9 C) 98.2 F (36.8 C) 98.5 F (36.9 C)   TempSrc: Oral Oral Oral   SpO2: 97% 100% 98% 97%  Weight:      Height:        NAD ABD soft Sensation intact distally Intact pulses distally Dorsiflexion/Plantar flexion intact Incision: dressing C/D/I Compartment soft   Lab Results  Component Value Date   WBC 7.8 08/10/2018   HGB 8.5 (L) 08/12/2018   HCT 28.3 (L) 08/12/2018   MCV 98.9 08/10/2018   PLT 141 (L) 08/10/2018   BMET    Component Value Date/Time   NA 138 08/12/2018 0510   K 4.2 08/12/2018 0510   CL 107 08/12/2018 0510   CO2 24 08/12/2018 0510   GLUCOSE 164 (H) 08/12/2018 0510   BUN 35 (H) 08/12/2018 0510   CREATININE 1.51 (H) 08/12/2018 0510   CALCIUM 8.8 (L) 08/12/2018 0510   GFRNONAA 31 (L) 08/12/2018 0510   GFRAA 36 (L) 08/12/2018 0510     Assessment/Plan: 5 Days Post-Op   Principal Problem:   Hip fracture due to osteoporosis, initial encounter ([place]) Active Problems:   Essential hypertension   Diabetic retinopathy of left eye associated with type 2 diabetes mellitus (HCC)   Cardiomyopathy, ischemic   CKD (chronic kidney disease) stage 3, GFR 30-59 ml/min (HCC)   CAD (coronary artery disease)   Hip fracture (HCC)   Closed comminuted intertrochanteric fracture of left femur ([place])   WBAT with walker DVT ppx: Lovenox for 30 days, SCDs, TEDS PO pain control PT/OT Dispo: D/C planning   Andrea Wilkinson 08/12/2018, 1:04 PM   Rod Can, MD Cell: [phone] [facility]  [facility] [address], [place], [place] [address] Phone: [phone] [email or web address] Facebook  [employer]

## 2018-08-13 ENCOUNTER — Telehealth: Payer: Self-pay | Admitting: *Deleted

## 2018-08-13 NOTE — Telephone Encounter (Signed)
Pt was on TCM report admitted 08/06/18 after fall and subsequent left hip fracture. Pt underwent intramedullary fixation of left femur on 2/19 by Dr. Lyla Glassing.  Patient had fairly uncomplicated postop course.  Evaluated by physical therapy who recommended disposition to SNF. Pt D/C 08/12/18 to [facility], and will follow-up w/surgeon in 2 weeks.Andrea KitchenJohny Chess

## 2018-08-17 DIAGNOSIS — S72142D Displaced intertrochanteric fracture of left femur, subsequent encounter for closed fracture with routine healing: Secondary | ICD-10-CM | POA: Diagnosis not present

## 2018-08-17 DIAGNOSIS — N183 Chronic kidney disease, stage 3 (moderate): Secondary | ICD-10-CM | POA: Diagnosis not present

## 2018-08-17 DIAGNOSIS — E1151 Type 2 diabetes mellitus with diabetic peripheral angiopathy without gangrene: Secondary | ICD-10-CM | POA: Diagnosis not present

## 2018-08-17 DIAGNOSIS — K5909 Other constipation: Secondary | ICD-10-CM | POA: Diagnosis not present

## 2018-08-23 DIAGNOSIS — S72142D Displaced intertrochanteric fracture of left femur, subsequent encounter for closed fracture with routine healing: Secondary | ICD-10-CM | POA: Diagnosis not present

## 2018-09-03 DIAGNOSIS — Z471 Aftercare following joint replacement surgery: Secondary | ICD-10-CM | POA: Diagnosis not present

## 2018-09-03 DIAGNOSIS — M6281 Muscle weakness (generalized): Secondary | ICD-10-CM | POA: Diagnosis not present

## 2018-09-03 DIAGNOSIS — S72142D Displaced intertrochanteric fracture of left femur, subsequent encounter for closed fracture with routine healing: Secondary | ICD-10-CM | POA: Diagnosis not present

## 2018-09-03 DIAGNOSIS — R2681 Unsteadiness on feet: Secondary | ICD-10-CM | POA: Diagnosis not present

## 2018-09-05 ENCOUNTER — Other Ambulatory Visit: Payer: Self-pay

## 2018-09-05 DIAGNOSIS — M6281 Muscle weakness (generalized): Secondary | ICD-10-CM | POA: Diagnosis not present

## 2018-09-05 DIAGNOSIS — R2681 Unsteadiness on feet: Secondary | ICD-10-CM | POA: Diagnosis not present

## 2018-09-05 DIAGNOSIS — Z471 Aftercare following joint replacement surgery: Secondary | ICD-10-CM | POA: Diagnosis not present

## 2018-09-05 DIAGNOSIS — S72142D Displaced intertrochanteric fracture of left femur, subsequent encounter for closed fracture with routine healing: Secondary | ICD-10-CM | POA: Diagnosis not present

## 2018-09-05 NOTE — Patient Outreach (Signed)
[facility]) Care Management  09/05/2018  [name] 1931/11/23 [id number]     Transition of Care Referral  Referral Date: 09/05/2018 Referral Source: [facility] Discharge Report Date of Admission: unknown Diagnosis: "left femur fx" Date of Discharge: 09/02/2018 Facility: [place]: [facility]    Outreach attempt # 1 to patient. Spoke with Andrea Wilkinson(DPR on file). She voices that patient remains at rehab facility. She reports that patient did change rooms a few days ago. She vocies that patient will remain there at least until after the virus outbreak is over due to safety and health concerns.    Plan: RN CM will close case at this time.    Enzo Montgomery, RN,BSN,CCM [place] Management Telephonic Care Management Coordinator Direct Phone: [phone] Toll Free: [phone] Fax: [phone]

## 2018-09-09 DIAGNOSIS — M6281 Muscle weakness (generalized): Secondary | ICD-10-CM | POA: Diagnosis not present

## 2018-09-09 DIAGNOSIS — R2681 Unsteadiness on feet: Secondary | ICD-10-CM | POA: Diagnosis not present

## 2018-09-09 DIAGNOSIS — Z471 Aftercare following joint replacement surgery: Secondary | ICD-10-CM | POA: Diagnosis not present

## 2018-09-09 DIAGNOSIS — S72142D Displaced intertrochanteric fracture of left femur, subsequent encounter for closed fracture with routine healing: Secondary | ICD-10-CM | POA: Diagnosis not present

## 2018-09-11 DIAGNOSIS — R2681 Unsteadiness on feet: Secondary | ICD-10-CM | POA: Diagnosis not present

## 2018-09-11 DIAGNOSIS — Z471 Aftercare following joint replacement surgery: Secondary | ICD-10-CM | POA: Diagnosis not present

## 2018-09-11 DIAGNOSIS — S72142D Displaced intertrochanteric fracture of left femur, subsequent encounter for closed fracture with routine healing: Secondary | ICD-10-CM | POA: Diagnosis not present

## 2018-09-11 DIAGNOSIS — M6281 Muscle weakness (generalized): Secondary | ICD-10-CM | POA: Diagnosis not present

## 2018-09-12 DIAGNOSIS — M6281 Muscle weakness (generalized): Secondary | ICD-10-CM | POA: Diagnosis not present

## 2018-09-12 DIAGNOSIS — S72142D Displaced intertrochanteric fracture of left femur, subsequent encounter for closed fracture with routine healing: Secondary | ICD-10-CM | POA: Diagnosis not present

## 2018-09-12 DIAGNOSIS — R2681 Unsteadiness on feet: Secondary | ICD-10-CM | POA: Diagnosis not present

## 2018-09-12 DIAGNOSIS — Z471 Aftercare following joint replacement surgery: Secondary | ICD-10-CM | POA: Diagnosis not present

## 2018-09-16 DIAGNOSIS — R2681 Unsteadiness on feet: Secondary | ICD-10-CM | POA: Diagnosis not present

## 2018-09-16 DIAGNOSIS — M6281 Muscle weakness (generalized): Secondary | ICD-10-CM | POA: Diagnosis not present

## 2018-09-16 DIAGNOSIS — Z471 Aftercare following joint replacement surgery: Secondary | ICD-10-CM | POA: Diagnosis not present

## 2018-09-16 DIAGNOSIS — S72142D Displaced intertrochanteric fracture of left femur, subsequent encounter for closed fracture with routine healing: Secondary | ICD-10-CM | POA: Diagnosis not present

## 2018-09-18 DIAGNOSIS — R2681 Unsteadiness on feet: Secondary | ICD-10-CM | POA: Diagnosis not present

## 2018-09-18 DIAGNOSIS — Z471 Aftercare following joint replacement surgery: Secondary | ICD-10-CM | POA: Diagnosis not present

## 2018-09-18 DIAGNOSIS — S72142D Displaced intertrochanteric fracture of left femur, subsequent encounter for closed fracture with routine healing: Secondary | ICD-10-CM | POA: Diagnosis not present

## 2018-09-18 DIAGNOSIS — M6281 Muscle weakness (generalized): Secondary | ICD-10-CM | POA: Diagnosis not present

## 2018-09-20 DIAGNOSIS — Z471 Aftercare following joint replacement surgery: Secondary | ICD-10-CM | POA: Diagnosis not present

## 2018-09-20 DIAGNOSIS — M6281 Muscle weakness (generalized): Secondary | ICD-10-CM | POA: Diagnosis not present

## 2018-09-20 DIAGNOSIS — S72142D Displaced intertrochanteric fracture of left femur, subsequent encounter for closed fracture with routine healing: Secondary | ICD-10-CM | POA: Diagnosis not present

## 2018-09-20 DIAGNOSIS — R2681 Unsteadiness on feet: Secondary | ICD-10-CM | POA: Diagnosis not present

## 2018-09-21 DIAGNOSIS — E119 Type 2 diabetes mellitus without complications: Secondary | ICD-10-CM | POA: Diagnosis not present

## 2018-09-21 DIAGNOSIS — I10 Essential (primary) hypertension: Secondary | ICD-10-CM | POA: Diagnosis not present

## 2018-09-21 DIAGNOSIS — S72142D Displaced intertrochanteric fracture of left femur, subsequent encounter for closed fracture with routine healing: Secondary | ICD-10-CM | POA: Diagnosis not present

## 2018-09-23 DIAGNOSIS — S72142D Displaced intertrochanteric fracture of left femur, subsequent encounter for closed fracture with routine healing: Secondary | ICD-10-CM | POA: Diagnosis not present

## 2018-09-23 DIAGNOSIS — R2681 Unsteadiness on feet: Secondary | ICD-10-CM | POA: Diagnosis not present

## 2018-09-23 DIAGNOSIS — M6281 Muscle weakness (generalized): Secondary | ICD-10-CM | POA: Diagnosis not present

## 2018-09-23 DIAGNOSIS — Z471 Aftercare following joint replacement surgery: Secondary | ICD-10-CM | POA: Diagnosis not present

## 2018-09-24 DIAGNOSIS — Z471 Aftercare following joint replacement surgery: Secondary | ICD-10-CM | POA: Diagnosis not present

## 2018-09-24 DIAGNOSIS — M6281 Muscle weakness (generalized): Secondary | ICD-10-CM | POA: Diagnosis not present

## 2018-09-24 DIAGNOSIS — R2681 Unsteadiness on feet: Secondary | ICD-10-CM | POA: Diagnosis not present

## 2018-09-24 DIAGNOSIS — S72142D Displaced intertrochanteric fracture of left femur, subsequent encounter for closed fracture with routine healing: Secondary | ICD-10-CM | POA: Diagnosis not present

## 2018-09-27 DIAGNOSIS — Z471 Aftercare following joint replacement surgery: Secondary | ICD-10-CM | POA: Diagnosis not present

## 2018-09-27 DIAGNOSIS — R2681 Unsteadiness on feet: Secondary | ICD-10-CM | POA: Diagnosis not present

## 2018-09-27 DIAGNOSIS — M6281 Muscle weakness (generalized): Secondary | ICD-10-CM | POA: Diagnosis not present

## 2018-09-27 DIAGNOSIS — S72142D Displaced intertrochanteric fracture of left femur, subsequent encounter for closed fracture with routine healing: Secondary | ICD-10-CM | POA: Diagnosis not present

## 2018-09-30 DIAGNOSIS — M6281 Muscle weakness (generalized): Secondary | ICD-10-CM | POA: Diagnosis not present

## 2018-09-30 DIAGNOSIS — S72142D Displaced intertrochanteric fracture of left femur, subsequent encounter for closed fracture with routine healing: Secondary | ICD-10-CM | POA: Diagnosis not present

## 2018-09-30 DIAGNOSIS — R2681 Unsteadiness on feet: Secondary | ICD-10-CM | POA: Diagnosis not present

## 2018-09-30 DIAGNOSIS — Z471 Aftercare following joint replacement surgery: Secondary | ICD-10-CM | POA: Diagnosis not present

## 2018-10-01 DIAGNOSIS — R2681 Unsteadiness on feet: Secondary | ICD-10-CM | POA: Diagnosis not present

## 2018-10-01 DIAGNOSIS — Z471 Aftercare following joint replacement surgery: Secondary | ICD-10-CM | POA: Diagnosis not present

## 2018-10-01 DIAGNOSIS — S72142D Displaced intertrochanteric fracture of left femur, subsequent encounter for closed fracture with routine healing: Secondary | ICD-10-CM | POA: Diagnosis not present

## 2018-10-01 DIAGNOSIS — M6281 Muscle weakness (generalized): Secondary | ICD-10-CM | POA: Diagnosis not present

## 2018-10-02 DIAGNOSIS — S72142D Displaced intertrochanteric fracture of left femur, subsequent encounter for closed fracture with routine healing: Secondary | ICD-10-CM | POA: Diagnosis not present

## 2018-10-02 DIAGNOSIS — Z471 Aftercare following joint replacement surgery: Secondary | ICD-10-CM | POA: Diagnosis not present

## 2018-10-02 DIAGNOSIS — R2681 Unsteadiness on feet: Secondary | ICD-10-CM | POA: Diagnosis not present

## 2018-10-02 DIAGNOSIS — M6281 Muscle weakness (generalized): Secondary | ICD-10-CM | POA: Diagnosis not present

## 2018-10-03 DIAGNOSIS — S72142D Displaced intertrochanteric fracture of left femur, subsequent encounter for closed fracture with routine healing: Secondary | ICD-10-CM | POA: Diagnosis not present

## 2018-10-03 DIAGNOSIS — R2681 Unsteadiness on feet: Secondary | ICD-10-CM | POA: Diagnosis not present

## 2018-10-03 DIAGNOSIS — Z471 Aftercare following joint replacement surgery: Secondary | ICD-10-CM | POA: Diagnosis not present

## 2018-10-03 DIAGNOSIS — M6281 Muscle weakness (generalized): Secondary | ICD-10-CM | POA: Diagnosis not present

## 2018-10-06 DIAGNOSIS — Z20828 Contact with and (suspected) exposure to other viral communicable diseases: Secondary | ICD-10-CM | POA: Diagnosis not present

## 2018-10-08 DIAGNOSIS — Z471 Aftercare following joint replacement surgery: Secondary | ICD-10-CM | POA: Diagnosis not present

## 2018-10-08 DIAGNOSIS — M6281 Muscle weakness (generalized): Secondary | ICD-10-CM | POA: Diagnosis not present

## 2018-10-08 DIAGNOSIS — R2681 Unsteadiness on feet: Secondary | ICD-10-CM | POA: Diagnosis not present

## 2018-10-08 DIAGNOSIS — S72142D Displaced intertrochanteric fracture of left femur, subsequent encounter for closed fracture with routine healing: Secondary | ICD-10-CM | POA: Diagnosis not present

## 2018-10-11 DIAGNOSIS — Z471 Aftercare following joint replacement surgery: Secondary | ICD-10-CM | POA: Diagnosis not present

## 2018-10-11 DIAGNOSIS — R2681 Unsteadiness on feet: Secondary | ICD-10-CM | POA: Diagnosis not present

## 2018-10-11 DIAGNOSIS — M6281 Muscle weakness (generalized): Secondary | ICD-10-CM | POA: Diagnosis not present

## 2018-10-11 DIAGNOSIS — S72142D Displaced intertrochanteric fracture of left femur, subsequent encounter for closed fracture with routine healing: Secondary | ICD-10-CM | POA: Diagnosis not present

## 2018-10-14 DIAGNOSIS — M6281 Muscle weakness (generalized): Secondary | ICD-10-CM | POA: Diagnosis not present

## 2018-10-14 DIAGNOSIS — S72142D Displaced intertrochanteric fracture of left femur, subsequent encounter for closed fracture with routine healing: Secondary | ICD-10-CM | POA: Diagnosis not present

## 2018-10-14 DIAGNOSIS — Z471 Aftercare following joint replacement surgery: Secondary | ICD-10-CM | POA: Diagnosis not present

## 2018-10-14 DIAGNOSIS — R2681 Unsteadiness on feet: Secondary | ICD-10-CM | POA: Diagnosis not present

## 2018-10-16 DIAGNOSIS — R2681 Unsteadiness on feet: Secondary | ICD-10-CM | POA: Diagnosis not present

## 2018-10-16 DIAGNOSIS — Z471 Aftercare following joint replacement surgery: Secondary | ICD-10-CM | POA: Diagnosis not present

## 2018-10-16 DIAGNOSIS — S72142D Displaced intertrochanteric fracture of left femur, subsequent encounter for closed fracture with routine healing: Secondary | ICD-10-CM | POA: Diagnosis not present

## 2018-10-16 DIAGNOSIS — M6281 Muscle weakness (generalized): Secondary | ICD-10-CM | POA: Diagnosis not present

## 2018-10-18 DIAGNOSIS — R278 Other lack of coordination: Secondary | ICD-10-CM | POA: Diagnosis not present

## 2018-10-18 DIAGNOSIS — S72142D Displaced intertrochanteric fracture of left femur, subsequent encounter for closed fracture with routine healing: Secondary | ICD-10-CM | POA: Diagnosis not present

## 2018-10-18 DIAGNOSIS — M6281 Muscle weakness (generalized): Secondary | ICD-10-CM | POA: Diagnosis not present

## 2018-10-18 DIAGNOSIS — R2681 Unsteadiness on feet: Secondary | ICD-10-CM | POA: Diagnosis not present

## 2018-10-21 DIAGNOSIS — R2681 Unsteadiness on feet: Secondary | ICD-10-CM | POA: Diagnosis not present

## 2018-10-21 DIAGNOSIS — R278 Other lack of coordination: Secondary | ICD-10-CM | POA: Diagnosis not present

## 2018-10-21 DIAGNOSIS — S72142D Displaced intertrochanteric fracture of left femur, subsequent encounter for closed fracture with routine healing: Secondary | ICD-10-CM | POA: Diagnosis not present

## 2018-10-21 DIAGNOSIS — M6281 Muscle weakness (generalized): Secondary | ICD-10-CM | POA: Diagnosis not present

## 2018-10-23 DIAGNOSIS — R278 Other lack of coordination: Secondary | ICD-10-CM | POA: Diagnosis not present

## 2018-10-23 DIAGNOSIS — M6281 Muscle weakness (generalized): Secondary | ICD-10-CM | POA: Diagnosis not present

## 2018-10-23 DIAGNOSIS — Z20828 Contact with and (suspected) exposure to other viral communicable diseases: Secondary | ICD-10-CM | POA: Diagnosis not present

## 2018-10-23 DIAGNOSIS — S72142D Displaced intertrochanteric fracture of left femur, subsequent encounter for closed fracture with routine healing: Secondary | ICD-10-CM | POA: Diagnosis not present

## 2018-10-23 DIAGNOSIS — R2681 Unsteadiness on feet: Secondary | ICD-10-CM | POA: Diagnosis not present

## 2018-10-25 DIAGNOSIS — R278 Other lack of coordination: Secondary | ICD-10-CM | POA: Diagnosis not present

## 2018-10-25 DIAGNOSIS — R2681 Unsteadiness on feet: Secondary | ICD-10-CM | POA: Diagnosis not present

## 2018-10-25 DIAGNOSIS — M6281 Muscle weakness (generalized): Secondary | ICD-10-CM | POA: Diagnosis not present

## 2018-10-25 DIAGNOSIS — S72142D Displaced intertrochanteric fracture of left femur, subsequent encounter for closed fracture with routine healing: Secondary | ICD-10-CM | POA: Diagnosis not present

## 2018-10-28 DIAGNOSIS — S72142D Displaced intertrochanteric fracture of left femur, subsequent encounter for closed fracture with routine healing: Secondary | ICD-10-CM | POA: Diagnosis not present

## 2018-10-28 DIAGNOSIS — M6281 Muscle weakness (generalized): Secondary | ICD-10-CM | POA: Diagnosis not present

## 2018-10-28 DIAGNOSIS — Z20828 Contact with and (suspected) exposure to other viral communicable diseases: Secondary | ICD-10-CM | POA: Diagnosis not present

## 2018-10-28 DIAGNOSIS — R2681 Unsteadiness on feet: Secondary | ICD-10-CM | POA: Diagnosis not present

## 2018-10-28 DIAGNOSIS — R278 Other lack of coordination: Secondary | ICD-10-CM | POA: Diagnosis not present

## 2018-10-30 DIAGNOSIS — S72142D Displaced intertrochanteric fracture of left femur, subsequent encounter for closed fracture with routine healing: Secondary | ICD-10-CM | POA: Diagnosis not present

## 2018-10-30 DIAGNOSIS — R278 Other lack of coordination: Secondary | ICD-10-CM | POA: Diagnosis not present

## 2018-10-30 DIAGNOSIS — R2681 Unsteadiness on feet: Secondary | ICD-10-CM | POA: Diagnosis not present

## 2018-10-30 DIAGNOSIS — M6281 Muscle weakness (generalized): Secondary | ICD-10-CM | POA: Diagnosis not present

## 2018-11-01 DIAGNOSIS — R278 Other lack of coordination: Secondary | ICD-10-CM | POA: Diagnosis not present

## 2018-11-01 DIAGNOSIS — M6281 Muscle weakness (generalized): Secondary | ICD-10-CM | POA: Diagnosis not present

## 2018-11-01 DIAGNOSIS — S72142D Displaced intertrochanteric fracture of left femur, subsequent encounter for closed fracture with routine healing: Secondary | ICD-10-CM | POA: Diagnosis not present

## 2018-11-01 DIAGNOSIS — R2681 Unsteadiness on feet: Secondary | ICD-10-CM | POA: Diagnosis not present

## 2018-11-04 DIAGNOSIS — R278 Other lack of coordination: Secondary | ICD-10-CM | POA: Diagnosis not present

## 2018-11-04 DIAGNOSIS — M6281 Muscle weakness (generalized): Secondary | ICD-10-CM | POA: Diagnosis not present

## 2018-11-04 DIAGNOSIS — S72142D Displaced intertrochanteric fracture of left femur, subsequent encounter for closed fracture with routine healing: Secondary | ICD-10-CM | POA: Diagnosis not present

## 2018-11-04 DIAGNOSIS — R2681 Unsteadiness on feet: Secondary | ICD-10-CM | POA: Diagnosis not present

## 2018-11-06 DIAGNOSIS — R2681 Unsteadiness on feet: Secondary | ICD-10-CM | POA: Diagnosis not present

## 2018-11-06 DIAGNOSIS — M6281 Muscle weakness (generalized): Secondary | ICD-10-CM | POA: Diagnosis not present

## 2018-11-06 DIAGNOSIS — R278 Other lack of coordination: Secondary | ICD-10-CM | POA: Diagnosis not present

## 2018-11-06 DIAGNOSIS — S72142D Displaced intertrochanteric fracture of left femur, subsequent encounter for closed fracture with routine healing: Secondary | ICD-10-CM | POA: Diagnosis not present

## 2018-11-08 DIAGNOSIS — R2681 Unsteadiness on feet: Secondary | ICD-10-CM | POA: Diagnosis not present

## 2018-11-08 DIAGNOSIS — S72142D Displaced intertrochanteric fracture of left femur, subsequent encounter for closed fracture with routine healing: Secondary | ICD-10-CM | POA: Diagnosis not present

## 2018-11-08 DIAGNOSIS — R278 Other lack of coordination: Secondary | ICD-10-CM | POA: Diagnosis not present

## 2018-11-08 DIAGNOSIS — M6281 Muscle weakness (generalized): Secondary | ICD-10-CM | POA: Diagnosis not present

## 2018-11-10 DIAGNOSIS — I10 Essential (primary) hypertension: Secondary | ICD-10-CM | POA: Diagnosis not present

## 2018-11-10 DIAGNOSIS — R110 Nausea: Secondary | ICD-10-CM | POA: Diagnosis not present

## 2018-11-10 DIAGNOSIS — E113599 Type 2 diabetes mellitus with proliferative diabetic retinopathy without macular edema, unspecified eye: Secondary | ICD-10-CM | POA: Diagnosis not present

## 2018-11-11 DIAGNOSIS — R2681 Unsteadiness on feet: Secondary | ICD-10-CM | POA: Diagnosis not present

## 2018-11-11 DIAGNOSIS — R278 Other lack of coordination: Secondary | ICD-10-CM | POA: Diagnosis not present

## 2018-11-11 DIAGNOSIS — M6281 Muscle weakness (generalized): Secondary | ICD-10-CM | POA: Diagnosis not present

## 2018-11-11 DIAGNOSIS — S72142D Displaced intertrochanteric fracture of left femur, subsequent encounter for closed fracture with routine healing: Secondary | ICD-10-CM | POA: Diagnosis not present

## 2018-11-12 DIAGNOSIS — S72142D Displaced intertrochanteric fracture of left femur, subsequent encounter for closed fracture with routine healing: Secondary | ICD-10-CM | POA: Diagnosis not present

## 2018-11-12 DIAGNOSIS — R278 Other lack of coordination: Secondary | ICD-10-CM | POA: Diagnosis not present

## 2018-11-12 DIAGNOSIS — R2681 Unsteadiness on feet: Secondary | ICD-10-CM | POA: Diagnosis not present

## 2018-11-12 DIAGNOSIS — M6281 Muscle weakness (generalized): Secondary | ICD-10-CM | POA: Diagnosis not present

## 2018-11-13 DIAGNOSIS — N390 Urinary tract infection, site not specified: Secondary | ICD-10-CM | POA: Diagnosis not present

## 2018-11-13 DIAGNOSIS — R300 Dysuria: Secondary | ICD-10-CM | POA: Diagnosis not present

## 2018-11-13 DIAGNOSIS — D649 Anemia, unspecified: Secondary | ICD-10-CM | POA: Diagnosis not present

## 2018-11-13 DIAGNOSIS — R319 Hematuria, unspecified: Secondary | ICD-10-CM | POA: Diagnosis not present

## 2018-11-14 DIAGNOSIS — M6281 Muscle weakness (generalized): Secondary | ICD-10-CM | POA: Diagnosis not present

## 2018-11-14 DIAGNOSIS — S72142D Displaced intertrochanteric fracture of left femur, subsequent encounter for closed fracture with routine healing: Secondary | ICD-10-CM | POA: Diagnosis not present

## 2018-11-14 DIAGNOSIS — R2681 Unsteadiness on feet: Secondary | ICD-10-CM | POA: Diagnosis not present

## 2018-11-14 DIAGNOSIS — R278 Other lack of coordination: Secondary | ICD-10-CM | POA: Diagnosis not present

## 2018-11-15 DIAGNOSIS — F419 Anxiety disorder, unspecified: Secondary | ICD-10-CM | POA: Diagnosis not present

## 2018-11-15 DIAGNOSIS — G4700 Insomnia, unspecified: Secondary | ICD-10-CM | POA: Diagnosis not present
